# Patient Record
Sex: Male | Born: 1935 | Race: White | Hispanic: No | Smoking: Never smoker
Health system: Southern US, Community
[De-identification: ages and names within clinical notes are randomized; demographics above are authoritative.]

## PROBLEM LIST (undated history)

## (undated) DIAGNOSIS — K922 Gastrointestinal hemorrhage, unspecified: Secondary | ICD-10-CM

## (undated) DIAGNOSIS — H3530 Unspecified macular degeneration: Secondary | ICD-10-CM

## (undated) DIAGNOSIS — N200 Calculus of kidney: Secondary | ICD-10-CM

## (undated) DIAGNOSIS — L821 Other seborrheic keratosis: Secondary | ICD-10-CM

## (undated) DIAGNOSIS — N289 Disorder of kidney and ureter, unspecified: Secondary | ICD-10-CM

## (undated) DIAGNOSIS — G4730 Sleep apnea, unspecified: Secondary | ICD-10-CM

## (undated) DIAGNOSIS — I10 Essential (primary) hypertension: Secondary | ICD-10-CM

## (undated) DIAGNOSIS — D126 Benign neoplasm of colon, unspecified: Secondary | ICD-10-CM

## (undated) DIAGNOSIS — H547 Unspecified visual loss: Secondary | ICD-10-CM

## (undated) DIAGNOSIS — J189 Pneumonia, unspecified organism: Secondary | ICD-10-CM

## (undated) DIAGNOSIS — E669 Obesity, unspecified: Secondary | ICD-10-CM

## (undated) DIAGNOSIS — K31819 Angiodysplasia of stomach and duodenum without bleeding: Secondary | ICD-10-CM

## (undated) DIAGNOSIS — I509 Heart failure, unspecified: Secondary | ICD-10-CM

## (undated) DIAGNOSIS — K219 Gastro-esophageal reflux disease without esophagitis: Secondary | ICD-10-CM

## (undated) DIAGNOSIS — N189 Chronic kidney disease, unspecified: Secondary | ICD-10-CM

## (undated) DIAGNOSIS — I639 Cerebral infarction, unspecified: Secondary | ICD-10-CM

## (undated) DIAGNOSIS — R0902 Hypoxemia: Secondary | ICD-10-CM

## (undated) DIAGNOSIS — E119 Type 2 diabetes mellitus without complications: Secondary | ICD-10-CM

## (undated) DIAGNOSIS — C4490 Unspecified malignant neoplasm of skin, unspecified: Secondary | ICD-10-CM

## (undated) DIAGNOSIS — E785 Hyperlipidemia, unspecified: Secondary | ICD-10-CM

## (undated) DIAGNOSIS — I4510 Unspecified right bundle-branch block: Secondary | ICD-10-CM

## (undated) DIAGNOSIS — K559 Vascular disorder of intestine, unspecified: Secondary | ICD-10-CM

## (undated) HISTORY — DX: Pneumonia, unspecified organism: J18.9

## (undated) HISTORY — DX: Hypoxemia: R09.02

## (undated) HISTORY — PX: DOPPLER ECHOCARDIOGRAPHY: SHX263

## (undated) HISTORY — PX: OTHER SURGICAL HISTORY: SHX169

## (undated) HISTORY — PX: CARDIOVASCULAR STRESS TEST: SHX262

## (undated) HISTORY — DX: Disorder of kidney and ureter, unspecified: N28.9

## (undated) HISTORY — DX: Gastro-esophageal reflux disease without esophagitis: K21.9

## (undated) HISTORY — DX: Other seborrheic keratosis: L82.1

## (undated) HISTORY — DX: Sleep apnea, unspecified: G47.30

## (undated) HISTORY — DX: Angiodysplasia of stomach and duodenum without bleeding: K31.819

## (undated) HISTORY — DX: Obesity, unspecified: E66.9

## (undated) HISTORY — PX: CHOLECYSTECTOMY: SHX55

## (undated) HISTORY — DX: Gastrointestinal hemorrhage, unspecified: K92.2

## (undated) HISTORY — DX: Unspecified visual loss: H54.7

## (undated) HISTORY — DX: Cerebral infarction, unspecified: I63.9

## (undated) HISTORY — PX: ELBOW SURGERY: SHX618

## (undated) HISTORY — DX: Calculus of kidney: N20.0

## (undated) HISTORY — DX: Chronic kidney disease, unspecified: N18.9

## (undated) HISTORY — PX: CARDIAC CATHETERIZATION: SHX172

## (undated) HISTORY — DX: Hyperlipidemia, unspecified: E78.5

## (undated) HISTORY — PX: BACK SURGERY: SHX140

## (undated) HISTORY — DX: Unspecified right bundle-branch block: I45.10

## (undated) HISTORY — PX: TRANSURETHRAL RESECTION OF PROSTATE: SHX73

## (undated) HISTORY — PX: APPENDECTOMY: SHX54

## (undated) HISTORY — DX: Vascular disorder of intestine, unspecified: K55.9

## (undated) HISTORY — PX: CATARACT EXTRACTION: SUR2

## (undated) HISTORY — DX: Essential (primary) hypertension: I10

## (undated) HISTORY — PX: POLYPECTOMY: SHX149

## (undated) HISTORY — DX: Type 2 diabetes mellitus without complications: E11.9

## (undated) HISTORY — DX: Benign neoplasm of colon, unspecified: D12.6

---

## 1997-12-27 ENCOUNTER — Other Ambulatory Visit: Admission: RE | Admit: 1997-12-27 | Discharge: 1997-12-27 | Payer: Self-pay | Admitting: Urology

## 1999-01-13 ENCOUNTER — Ambulatory Visit (HOSPITAL_COMMUNITY): Admission: RE | Admit: 1999-01-13 | Discharge: 1999-01-13 | Payer: Self-pay | Admitting: Cardiovascular Disease

## 1999-01-13 ENCOUNTER — Encounter: Payer: Self-pay | Admitting: Cardiovascular Disease

## 2001-02-04 ENCOUNTER — Ambulatory Visit (HOSPITAL_COMMUNITY): Admission: RE | Admit: 2001-02-04 | Discharge: 2001-02-04 | Payer: Self-pay | Admitting: Urology

## 2001-02-04 ENCOUNTER — Encounter: Payer: Self-pay | Admitting: Urology

## 2001-07-12 ENCOUNTER — Ambulatory Visit (HOSPITAL_COMMUNITY): Admission: RE | Admit: 2001-07-12 | Discharge: 2001-07-12 | Payer: Self-pay | Admitting: Family Medicine

## 2001-07-12 ENCOUNTER — Encounter: Payer: Self-pay | Admitting: Family Medicine

## 2001-10-17 ENCOUNTER — Ambulatory Visit (HOSPITAL_COMMUNITY): Admission: RE | Admit: 2001-10-17 | Discharge: 2001-10-17 | Payer: Self-pay | Admitting: Family Medicine

## 2001-10-17 ENCOUNTER — Encounter: Payer: Self-pay | Admitting: Family Medicine

## 2002-10-30 ENCOUNTER — Encounter: Payer: Self-pay | Admitting: Urology

## 2002-10-30 ENCOUNTER — Ambulatory Visit (HOSPITAL_COMMUNITY): Admission: RE | Admit: 2002-10-30 | Discharge: 2002-10-30 | Payer: Self-pay | Admitting: Urology

## 2004-03-06 ENCOUNTER — Observation Stay (HOSPITAL_COMMUNITY): Admission: RE | Admit: 2004-03-06 | Discharge: 2004-03-07 | Payer: Self-pay | Admitting: Urology

## 2004-09-12 ENCOUNTER — Ambulatory Visit (HOSPITAL_COMMUNITY): Admission: RE | Admit: 2004-09-12 | Discharge: 2004-09-12 | Payer: Self-pay | Admitting: Family Medicine

## 2004-09-20 ENCOUNTER — Ambulatory Visit: Payer: Self-pay | Admitting: Internal Medicine

## 2004-09-20 ENCOUNTER — Inpatient Hospital Stay (HOSPITAL_COMMUNITY): Admission: EM | Admit: 2004-09-20 | Discharge: 2004-09-24 | Payer: Self-pay | Admitting: Emergency Medicine

## 2005-08-03 ENCOUNTER — Other Ambulatory Visit: Admission: RE | Admit: 2005-08-03 | Discharge: 2005-08-03 | Payer: Self-pay | Admitting: Dermatology

## 2006-07-09 ENCOUNTER — Ambulatory Visit (HOSPITAL_COMMUNITY): Admission: RE | Admit: 2006-07-09 | Discharge: 2006-07-09 | Payer: Self-pay | Admitting: Family Medicine

## 2007-06-12 ENCOUNTER — Emergency Department (HOSPITAL_COMMUNITY): Admission: EM | Admit: 2007-06-12 | Discharge: 2007-06-12 | Payer: Self-pay | Admitting: Emergency Medicine

## 2007-08-08 ENCOUNTER — Ambulatory Visit (HOSPITAL_COMMUNITY): Admission: RE | Admit: 2007-08-08 | Discharge: 2007-08-08 | Payer: Self-pay | Admitting: Family Medicine

## 2007-08-08 ENCOUNTER — Inpatient Hospital Stay (HOSPITAL_COMMUNITY): Admission: EM | Admit: 2007-08-08 | Discharge: 2007-08-11 | Payer: Self-pay | Admitting: Emergency Medicine

## 2007-08-08 ENCOUNTER — Encounter (INDEPENDENT_AMBULATORY_CARE_PROVIDER_SITE_OTHER): Payer: Self-pay | Admitting: Family Medicine

## 2007-08-09 ENCOUNTER — Ambulatory Visit: Payer: Self-pay | Admitting: Cardiology

## 2007-09-14 ENCOUNTER — Emergency Department (HOSPITAL_COMMUNITY): Admission: EM | Admit: 2007-09-14 | Discharge: 2007-09-14 | Payer: Self-pay | Admitting: Emergency Medicine

## 2007-12-08 ENCOUNTER — Other Ambulatory Visit: Payer: Self-pay | Admitting: Urology

## 2007-12-08 ENCOUNTER — Observation Stay (HOSPITAL_COMMUNITY): Admission: AD | Admit: 2007-12-08 | Discharge: 2007-12-09 | Payer: Self-pay | Admitting: Urology

## 2007-12-08 ENCOUNTER — Encounter (INDEPENDENT_AMBULATORY_CARE_PROVIDER_SITE_OTHER): Payer: Self-pay | Admitting: Urology

## 2008-12-16 ENCOUNTER — Emergency Department (HOSPITAL_COMMUNITY): Admission: EM | Admit: 2008-12-16 | Discharge: 2008-12-16 | Payer: Self-pay | Admitting: Emergency Medicine

## 2009-01-16 ENCOUNTER — Emergency Department (HOSPITAL_COMMUNITY): Admission: EM | Admit: 2009-01-16 | Discharge: 2009-01-16 | Payer: Self-pay | Admitting: Emergency Medicine

## 2009-01-25 ENCOUNTER — Ambulatory Visit (HOSPITAL_COMMUNITY): Admission: RE | Admit: 2009-01-25 | Discharge: 2009-01-25 | Payer: Self-pay | Admitting: Urology

## 2009-02-12 ENCOUNTER — Ambulatory Visit (HOSPITAL_COMMUNITY): Admission: RE | Admit: 2009-02-12 | Discharge: 2009-02-12 | Payer: Self-pay | Admitting: Family Medicine

## 2009-02-15 ENCOUNTER — Ambulatory Visit: Payer: Self-pay | Admitting: Cardiology

## 2009-02-15 ENCOUNTER — Inpatient Hospital Stay (HOSPITAL_COMMUNITY): Admission: EM | Admit: 2009-02-15 | Discharge: 2009-02-19 | Payer: Self-pay | Admitting: Emergency Medicine

## 2009-02-15 ENCOUNTER — Encounter (INDEPENDENT_AMBULATORY_CARE_PROVIDER_SITE_OTHER): Payer: Self-pay | Admitting: Internal Medicine

## 2009-02-18 ENCOUNTER — Encounter: Admission: RE | Admit: 2009-02-18 | Discharge: 2009-02-18 | Payer: Self-pay | Admitting: Internal Medicine

## 2009-07-22 ENCOUNTER — Emergency Department (HOSPITAL_COMMUNITY): Admission: EM | Admit: 2009-07-22 | Discharge: 2009-07-22 | Payer: Self-pay | Admitting: Emergency Medicine

## 2009-07-26 ENCOUNTER — Ambulatory Visit (HOSPITAL_COMMUNITY): Admission: RE | Admit: 2009-07-26 | Discharge: 2009-07-26 | Payer: Self-pay | Admitting: Family Medicine

## 2010-01-14 ENCOUNTER — Inpatient Hospital Stay (HOSPITAL_COMMUNITY): Admission: EM | Admit: 2010-01-14 | Discharge: 2010-01-20 | Payer: Self-pay | Admitting: Emergency Medicine

## 2010-01-26 ENCOUNTER — Inpatient Hospital Stay (HOSPITAL_COMMUNITY): Admission: EM | Admit: 2010-01-26 | Discharge: 2010-02-05 | Payer: Self-pay | Admitting: Emergency Medicine

## 2010-01-27 ENCOUNTER — Ambulatory Visit: Payer: Self-pay | Admitting: Internal Medicine

## 2010-01-29 ENCOUNTER — Ambulatory Visit: Payer: Self-pay | Admitting: Internal Medicine

## 2010-02-04 ENCOUNTER — Encounter (INDEPENDENT_AMBULATORY_CARE_PROVIDER_SITE_OTHER): Payer: Self-pay

## 2010-02-04 ENCOUNTER — Ambulatory Visit: Payer: Self-pay | Admitting: Gastroenterology

## 2010-02-12 ENCOUNTER — Encounter: Payer: Self-pay | Admitting: Internal Medicine

## 2010-02-27 DIAGNOSIS — E1169 Type 2 diabetes mellitus with other specified complication: Secondary | ICD-10-CM | POA: Insufficient documentation

## 2010-02-27 DIAGNOSIS — I10 Essential (primary) hypertension: Secondary | ICD-10-CM | POA: Insufficient documentation

## 2010-02-27 DIAGNOSIS — Z8719 Personal history of other diseases of the digestive system: Secondary | ICD-10-CM | POA: Insufficient documentation

## 2010-02-27 DIAGNOSIS — K219 Gastro-esophageal reflux disease without esophagitis: Secondary | ICD-10-CM

## 2010-02-27 DIAGNOSIS — E669 Obesity, unspecified: Secondary | ICD-10-CM | POA: Insufficient documentation

## 2010-02-27 DIAGNOSIS — I6350 Cerebral infarction due to unspecified occlusion or stenosis of unspecified cerebral artery: Secondary | ICD-10-CM | POA: Insufficient documentation

## 2010-02-27 DIAGNOSIS — E785 Hyperlipidemia, unspecified: Secondary | ICD-10-CM

## 2010-03-04 ENCOUNTER — Ambulatory Visit: Payer: Self-pay | Admitting: Internal Medicine

## 2010-03-04 ENCOUNTER — Telehealth (INDEPENDENT_AMBULATORY_CARE_PROVIDER_SITE_OTHER): Payer: Self-pay | Admitting: *Deleted

## 2010-03-04 DIAGNOSIS — Z860100 Personal history of colon polyps, unspecified: Secondary | ICD-10-CM | POA: Insufficient documentation

## 2010-03-12 ENCOUNTER — Telehealth (INDEPENDENT_AMBULATORY_CARE_PROVIDER_SITE_OTHER): Payer: Self-pay

## 2010-03-13 ENCOUNTER — Encounter: Payer: Self-pay | Admitting: Internal Medicine

## 2010-03-27 ENCOUNTER — Inpatient Hospital Stay (HOSPITAL_COMMUNITY): Admission: EM | Admit: 2010-03-27 | Discharge: 2010-04-03 | Payer: Self-pay | Admitting: Emergency Medicine

## 2010-03-28 ENCOUNTER — Ambulatory Visit: Payer: Self-pay | Admitting: Internal Medicine

## 2010-03-29 ENCOUNTER — Ambulatory Visit: Payer: Self-pay | Admitting: Internal Medicine

## 2010-03-29 ENCOUNTER — Encounter (INDEPENDENT_AMBULATORY_CARE_PROVIDER_SITE_OTHER): Payer: Self-pay

## 2010-03-30 ENCOUNTER — Ambulatory Visit: Payer: Self-pay | Admitting: Internal Medicine

## 2010-03-31 ENCOUNTER — Ambulatory Visit: Payer: Self-pay | Admitting: Internal Medicine

## 2010-04-04 ENCOUNTER — Encounter: Payer: Self-pay | Admitting: Internal Medicine

## 2010-04-22 ENCOUNTER — Ambulatory Visit: Payer: Self-pay | Admitting: Internal Medicine

## 2010-04-22 ENCOUNTER — Encounter: Payer: Self-pay | Admitting: Gastroenterology

## 2010-04-25 ENCOUNTER — Encounter: Payer: Self-pay | Admitting: Gastroenterology

## 2010-04-25 ENCOUNTER — Encounter (INDEPENDENT_AMBULATORY_CARE_PROVIDER_SITE_OTHER): Payer: Self-pay

## 2010-04-25 DIAGNOSIS — D649 Anemia, unspecified: Secondary | ICD-10-CM

## 2010-04-25 LAB — CONVERTED CEMR LAB
Basophils Absolute: 0 10*3/uL (ref 0.0–0.1)
Eosinophils Absolute: 0.3 10*3/uL (ref 0.0–0.7)
Eosinophils Relative: 3 % (ref 0–5)
HCT: 37.6 % — ABNORMAL LOW (ref 39.0–52.0)
Lymphs Abs: 2.4 10*3/uL (ref 0.7–4.0)
MCV: 89.5 fL (ref 78.0–100.0)
Monocytes Absolute: 0.9 10*3/uL (ref 0.1–1.0)
Platelets: 378 10*3/uL (ref 150–400)
RDW: 15.1 % (ref 11.5–15.5)

## 2010-05-05 ENCOUNTER — Encounter (INDEPENDENT_AMBULATORY_CARE_PROVIDER_SITE_OTHER): Payer: Self-pay | Admitting: *Deleted

## 2010-05-09 ENCOUNTER — Encounter: Payer: Self-pay | Admitting: Internal Medicine

## 2010-06-11 ENCOUNTER — Encounter: Payer: Self-pay | Admitting: Internal Medicine

## 2010-06-29 HISTORY — PX: NM MYOVIEW LTD: HXRAD82

## 2010-07-21 ENCOUNTER — Encounter: Payer: Self-pay | Admitting: Urology

## 2010-07-31 NOTE — Assessment & Plan Note (Signed)
Summary: follow up from hospital- cdg   Visit Type:  Follow-up Visit Primary Care Provider:  fusco  Chief Complaint:  follow up from hosp. doing better.  History of Present Illness: 77 year old gentleman recent hospitalization for colitis. Stool studies negative. He ultimately responded to course of vancomycin. Sigmoidoscopy demonstrated nonspecific colitis. He is back to baseline. He has history of colonic adenoma removed in 2006. He is due for surveillance colonoscopy at this time. He is having one to 3 bowel movements daily. He has not passed any blood per rectum.  Preventive Screening-Counseling & Management  Alcohol-Tobacco     Smoking Status: never  Current Problems (verified): 1)  Hyperlipidemia  (ICD-272.4) 2)  Exogenous Obesity  (ICD-278.00) 3)  Gastrointestinal Hemorrhage, Hx of  (ICD-V12.79) 4)  Gerd  (ICD-530.81) 5)  Cva  (ICD-434.91) 6)  Hypertension  (ICD-401.9) 7)  Dm  (ICD-250.00)  Current Medications (verified): 1)  Norvasc 10 Mg Tabs (Amlodipine Besylate) .... Once Daily 2)  Aspir-Low 81 Mg Tbec (Aspirin) 3)  Clonidine Hcl 0.2 Mg Tabs (Clonidine Hcl) .... Three Times A Day 4)  Lantus 100 Unit/ml Soln (Insulin Glargine) .... 25 Units At Bedtime 5)  Januvia 100 Mg Tabs (Sitagliptin Phosphate) .... Once Daily 6)  Metoprolol Tartrate 50 Mg Tabs (Metoprolol Tartrate) .... Two Times A Day 7)  Tamsulosin Hcl 0.4 Mg Caps (Tamsulosin Hcl) .... Once Daily 8)  Trilipix 135 Mg Cpdr (Choline Fenofibrate) .... Once Daily 9)  Florastor 250 Mg Caps (Saccharomyces Boulardii) .... Once Daily 10)  Tylenol Extra Strength 500 Mg Tabs (Acetaminophen) .... 2 At Bedtime  Allergies (verified): 1)  ! Darvon 2)  ! Sulfa  Past History:  Past Medical History: Diverticulitis Hypertension DM  Past Surgical History: back elbow gallbladder  Family History: Father: deceased Mother: deceased Siblings: 5 brothers, 2 sisters No FH of Colon Cancer:  Social History: Marital  Status: widow Children: 3 Occupation:retired  Patient has never smoked.  Alcohol Use - no Smoking Status:  never  Vital Signs:  Patient profile:   77 year old male Height:      67 inches Weight:      237 pounds BMI:     37.25 Temp:     97.6 degrees F oral Pulse rate:   76 / minute BP sitting:   132 / 88  (left arm) Cuff size:   regular  Vitals Entered By: Hendricks Limes LPN (March 04, 2010 10:21 AM)  Physical Exam  General:  alert conversant no acute distress Lungs:  clear to auscultation Heart:  regular rate rhythm without murmur gallop rub Abdomen:  nondistended positive bowel sounds soft nontender without appreciable mass or organomegaly Rectal:  deferred until time of colonoscopy  Impression & Recommendations: Impression: 77 year old gentleman recently hospitalized with  diarrhea ; patchy colitis on flexible sigmoidoscopy. Biopsied nonspecific -  stool studies are negative including C. difficile assay; improved with  a course of  oral vancomycin - symptoms eventually improve;  History of a colonic adenoma -  due for surveillance colonoscopy this time.  Recommendations: Surveillance colonoscopy in the near future. Risks, benefits, limitations, alternatives and imponderables have been reviewed her questions have been answered; he is agreeable.  We'll decrease his Lantus insulin to 20 units  night before the procedure; further recommendations to follow.  Appended Document: Orders Update    Clinical Lists Changes  Problems: Added new problem of COLONIC POLYPS, HX OF (ICD-V12.72) Orders: Added new Service order of Est. Patient Level III (81191) - Signed

## 2010-07-31 NOTE — Letter (Signed)
Summary: OFFICE NOTE FROM [facility]  OFFICE NOTE FROM [facility]   Imported By: Rexene Alberts 06/11/2010 14:24:55  _____________________________________________________________________  External Attachment:    Type:   Image     Comment:   External Document

## 2010-07-31 NOTE — Letter (Signed)
Summary: CONSULTATION FROM 01/27/10  CONSULTATION FROM 01/27/10   Imported By: Rexene Alberts 02/12/2010 10:38:14  _____________________________________________________________________  External Attachment:    Type:   Image     Comment:   External Document

## 2010-07-31 NOTE — Letter (Signed)
Summary: LABS  LABS   Imported By: Rexene Alberts 05/09/2010 11:03:42  _____________________________________________________________________  External Attachment:    Type:   Image     Comment:   External Document

## 2010-07-31 NOTE — Progress Notes (Signed)
Summary: ? about prep  Phone Note Call from Patient Call back at Home Phone [phone]   Caller: Patient Summary of Call: pt came by office- he had refused to schedule tcs during his ov because he stated he didnt want to drink the prep. After going home and thinking about it and speaking with friends he has decided he would do the procedure but only if he was given the osmo prep. I infomed pt of the potential for kidney damage and he said that it was the only way he would do procedure. please advise Initial call taken by: Hendricks Limes LPN,  March 12, 2010 3:41 PM     Appended Document: ? about prep ok ; we will go w osmoprep ; pt needs to be strongly advised to increase fluid intake by 50% DURING PREP PERIOD ; WILL GIVE SOME ADDITONAL IVF AT TIME OF PROCEDURE  Appended Document: ? about prep pt aware, went over the need for increased fluids during the prep several times and warned him of the risks of kidney damage again. pt verbalized understanding and stated he was available anytime to do his procedure.   Appended Document: ? about prep Osmo Prep instructions left at the front desk for the pt.

## 2010-07-31 NOTE — Letter (Signed)
Summary: [facility] PREP INSTRUCTIONS  [facility] PREP INSTRUCTIONS   Imported By: Ave Filter 03/13/2010 10:55:56  _____________________________________________________________________  External Attachment:    Type:   Image     Comment:   External Document

## 2010-07-31 NOTE — Letter (Signed)
Summary: Recall, Labs Needed  [facility]  [address]   [place], [place] [address]   Phone: [phone]  Fax: [phone]    April 25, 2010  BASSAM DRESCH [address] [place], [place]  [address] 03-Aug-1930   Dear Mr. KERSH,   Our records indicate it is time to repeat your blood work.  You can take the enclosed form to the lab on or near the date indicated.  Please make note of the new location of the lab:   [address], 2nd floor   [facility]  Our office will call you within a week to ten business days with the results.  If you do not hear from [place] in 10 business days, you should call the office.  If you have any questions regarding this, call the office at [phone], and ask for the nurse.  Labs are due on 05/26/2010.   Sincerely,    Hendricks Limes LPN  [facility] Ph: [phone]   Fax: [phone]

## 2010-07-31 NOTE — Miscellaneous (Signed)
Summary: Orders Update  Clinical Lists Changes  Problems: Added new problem of ANEMIA (ICD-285.9) Orders: Added new Test order of T-Hemoglobin and Hematocrit (1005) - Signed 

## 2010-07-31 NOTE — Letter (Signed)
Summary: Patient Notice, Colon Biopsy Results  [facility]  [address]   [place], [place] [address]   Phone: [phone]  Fax: [phone]       April 04, 2010   John [name] [address] [place], [place]  [address] 1931-05-31    Dear John [name],  I am pleased to inform you that the biopsies taken during your recent colonoscopy did not show any evidence of cancer upon pathologic examination.  Additional information/recommendations:  You should have a repeat colonoscopy examination  in 5 years.  Please call us if you are having persistent problems or have questions about your condition that have not been fully answered at this time.  Sincerely,    R. Roetta Sessions MD, FACP [facility] Ph: [phone]    Fax: [phone]   Appended Document: Patient Notice, Colon Biopsy Results letter mailed to pt  Appended Document: Patient Notice, Colon Biopsy Results reminder in computer

## 2010-07-31 NOTE — Progress Notes (Signed)
Summary: Diabetic Med Adjustment  ---- Converted from flag ---- ---- 03/04/2010 10:54 AM, [name] MD, Caleen Essex wrote: about Johnothan Brion - decrease lantus to 20 units night before tcs-everything else ok ------------------------------  Appended Document: Diabetic Med Adjustment   Pt informed.

## 2010-07-31 NOTE — Miscellaneous (Signed)
Summary: CONSULTATION  Clinical Lists Changes  NAME:  John [name], John [name]                ACCOUNT NO.:  [id number]      MEDICAL RECORD NO.:  [id number]          PATIENT TYPE:  INP      LOCATION:  IC03                          FACILITY:  [facility]      PHYSICIAN:  R. Roetta Sessions, M.D. DATE OF BIRTH:  1930/09/20      DATE OF CONSULTATION:  03/28/2010   DATE OF DISCHARGE:                                    CONSULTATION         REFERRING PHYSICIAN:  Dr. Osvaldo Shipper with [facility] team.      REASON FOR CONSULTATION:  GI bleed.      HISTORY OF PRESENT ILLNESS:  John [name] is a very pleasant 77 year old   gentleman, who we saw during hospitalization back in August, when he   presented with abdominal pain and bloody stools.  He underwent a   flexible sigmoidoscopy at that time by Dr. Darrick Penna that showed patchy   colitis beginning in the sigmoid colon and extending to the transverse   colon, most pronounced in the descending and sigmoid colon.  There was   erythema, edema and ulceration with mucosal sparing.  Biopsies favoredan ischemic process, such as ischemic colitis.  His stool studies were   negative.  He was treated with Flagyl and eventually vancomycin and   returned to his baseline.  He was seen in the office by Dr. Jena Gauss back   on March 04, 2010, and was actually doing very well.  They were   scheduling a surveillance colonoscopy given his history of previous   adenomas.  He was actually supposed to have this done yesterday or   today.  The patient states that all week he just has not felt well.   Really nothing specific.  Yesterday, however, he started getting sweaty   and felt lightheaded.  He passed a couple of black stools.  Later in the   evening, he passed more fresh blood per rectum and filled up the   commode.  He denies any Pepto-Bismol use.  He has had a history of   bleeding peptic ulcer disease in the past as outlined below.  Since he   presented to the hospital,  he had another large fresh blood per rectum   and became quite hypotensive and a code was actually called on him.  He   has received 2 units of packed red blood cells at this point.  This   morning at 4 a.m., his hemoglobin was 8.3.  When he presented, his   hemoglobin was 10.3.  PT and INR were normal.  He ultimately did have a   CT during the night due to the significant bleeding.  He had a liquid   stool throughout the colon, but nonspecific.  He had renal cortical   atrophy with multiple bilateral small renal lesions, felt to be cysts.   The study was noncontrast.  The liver appeared to be normal.  No report   or evidence of cirrhosis.  The patient takes aspirin 81 mg daily, but no other NSAIDs or aspirin   use.      MEDICATIONS AT HOME:   1. Lantus 25 units nightly.   2. Florastor 250 mg daily.   3. Norvasc 10 mg daily.   4. Aspirin 81 mg daily.   5. Januvia 100 mg daily.   6. Metoprolol 50 mg b.i.d.   7. Flomax 0.4 mg daily.   8. Trilipix 135 mg daily.   9. Tylenol Extra-Strength 2 at bedtime.      ALLERGIES:   1. DARVON.   2. SULFA.   3. BETADINE.      PAST MEDICAL HISTORY:   1. In 2006, he presented with a GI bleed.  He had a colonoscopy and       EGD during that hospitalization.  Colonoscopy and terminal       ileoscopy was okay, except for a small polyp at the descending       colon.  He had erosive antral gastritis with two polyps, one at the       antrum and one just below the cardia without stigmata of bleeding.       Some oozing from a fundal polyp, which was easily controlled.  The       study was followed by a Givens capsule of the small bowel, which       showed a few jejunal ulcers with stigmata of active bleeding and       oozing.  At that time, he was on naproxen and low-dose aspirin.  H.       pylori serologies were negative.   2. Diabetes.   3. Pneumonia in July, requiring antibiotics.   4. Hyperlipidemia.   5. Hypertension.   6. Stroke.   7.  Chronic GERD.   8. Left eye poor vision.   9. Bilateral cataract extraction.   10.TURP with history of BPH.   11.Lumbar spine surgery in the remote past.   12.Nephrolithiasis.   13.Sleep apnea.   14.Obesity.   15.Peripheral edema.   16.Appendectomy.   17.Cholecystectomy.   18.Chronic renal insufficiency.   19.Elbow surgery.      FAMILY HISTORY:  Negative for colorectal cancer, chronic liver disease   or GI problems.  No IBD.  He had a brother, who had GI bleed recently.   A sister deceased of unknown malignancy.  One daughter deceased   secondary to brain aneurysm at age 82.      SOCIAL HISTORY:  He is widowed.  He has two living children.  He is   retired from [employer], where he worked for over 30 years.  He denies   any tobacco, alcohol or drug use.      REVIEW OF SYSTEMS:  See HPI for GI.  CONSTITUTIONAL:  Denies any weight   loss.  He complains of night sweats chronically.  CARDIOPULMONARY:   Denies chest pain, shortness of breath, palpitations or cough.   GENITOURINARY:  Denies dysuria or hematuria.      PHYSICAL EXAMINATION:  VITAL SIGNS:  Weight is 106 kg, height 67 inches.   Temperature 98.1, pulse 102, respirations 16, blood pressure 132/55.   GENERAL:  A pleasant obese, ill-appearing Caucasian gentleman in no   acute distress.   SKIN:  Warm and dry.  No jaundice.   HEENT:  Sclerae nonicteric.  Oropharyngeal mucosa moist and pink.  No   lesions, erythema or exudate.  No lymphadenopathy or thyromegaly.   CHEST:  Lungs are clear to auscultation.   CARDIAC:  Reveals a regular rate and rhythm.  No murmurs.   ABDOMEN:  Positive bowel sounds, obese.  He has mild to moderate   tenderness in the upper abdomen to deep palpation.  No rebound or   guarding.  No organomegaly or masses.  No abdominal bruits or hernias.   LOWER EXTREMITIES:  No edema.      LABORATORY DATA:  As mentioned above.  In addition, his sodium is 141,   potassium 3.9, BUN 33, creatinine 1.65, white  count 14,300, platelets   393,000.  Total bilirubin 0.6, alk phos 46, AST 18, ALT 15, albumin 3.   Creatinine on admission was 1.7, BUN was 32.  His PTT is 30, INR is   0.99.  Notably on February 04, 2010, his hemoglobin was 12.8, creatinine   was 1.5.      IMPRESSION:  John [name] is a very pleasant 77 year old gentleman with   hospitalization last month for colitis, possibly ischemic.  He   completely recovered from that illness, but yesterday he developed what   appears be an upper gastrointestinal bleed.  He has a history of   previous jejunal ulcers with bleeding while he was on non-steroidal anti-   inflammatory drugs at the time.  There is no evidence of cirrhosis on   CT, therefore an esophageal variceal bleed very unlikely.  His last   esophagogastroduodenoscopy was in 2006.  We recommend   esophagogastroduodenoscopy at this time to further evaluate upper   gastrointestinal bleed.  We will plan for procedure at bedside.  He was   scheduled for a surveillance colonoscopy within the last 24 hours, of   course this was not done.  This is a loose end that will need to be done   at a later date.      PLAN:   1. EGD today at bedside.   2. Continue IV Protonix b.i.d.   3. Serial hemoglobin and hematocrit, transfuse as needed.   4. Further recommendations to follow.               Tana Coast, P.A.         ______________________________   R. Roetta Sessions, M.D.            LL/MEDQ  D:  03/28/2010  T:  03/28/2010  Job:  [id number]      cc:   Osvaldo Shipper, MD      Corrie Mckusick, M.D.   Fax: [phone]      Electronically Signed by Tana Coast P.A. on 04/09/2010 08:05:41 AM   Electronically Signed by Lorrin Goodell M.D. on 05/05/2010 11:17:08 AM

## 2010-07-31 NOTE — Letter (Signed)
Summary: CONSULTATION  CONSULTATION   Imported By: Rexene Alberts 02/12/2010 14:23:28  _____________________________________________________________________  External Attachment:    Type:   Image     Comment:   External Document

## 2010-07-31 NOTE — Assessment & Plan Note (Signed)
Summary: HOS FU IN 2 WEEKS/SS   Visit Type:  f/u Primary Care Provider:  Robbie Lis   Chief Complaint:  Hospital f/U and GI bleed.  History of Present Illness: John [name] is here for f/u of recent hospitalization for obscure GI bleeding. He required at least 4-5 units of prbcs. He had been on ASA and started on naproxyn and medro dospak for arthritic pain. He denies any ASA or NSAIDS since d/c earlier this month. His Hgb at D/C was 8.9. He is due for f/u labs. He is taking iron and pantoprazole daily. Denies abd pain, n/v, heartburn, constipation, diarrhea, melena, brbpr. He is eating well. He has appt with his cardiologist and endocrinologist in couple of weeks.     Current Medications (verified): 1)  Norvasc 10 Mg Tabs (Amlodipine Besylate) .... Once Daily 2)  Clonidine Hcl 0.2 Mg Tabs (Clonidine Hcl) .... Three Times A Day 3)  Lantus 100 Unit/ml Soln (Insulin Glargine) .... 35 Units At Bedtime 4)  Januvia 100 Mg Tabs (Sitagliptin Phosphate) .... Once Daily 5)  Tamsulosin Hcl 0.4 Mg Caps (Tamsulosin Hcl) .... Once Daily 6)  Trilipix 135 Mg Cpdr (Choline Fenofibrate) .... Once Daily 7)  Tylenol Extra Strength 500 Mg Tabs (Acetaminophen) .... 2 At Bedtime As Needed 8)  Multi-Vitamin .... Take 1 Tablet By Mouth Once A Day 9)  Iron 150 Mg .... Take 1 Tablet By Mouth Once A Day 10)  Pantoprazole Sodium 40 Mg Tbec (Pantoprazole Sodium) .... Take 1 Tablet By Mouth Once A Day 11)  Clonidine .... Take 1 Tablet By Mouth Three Times A Day  Allergies: 1)  ! Darvon 2)  ! Sulfa 3)  ! Betadine 4)  ! * Latex  Review of Systems      See HPI  Vital Signs:  Patient profile:   77 year old male Height:      67 inches Weight:      236 pounds BMI:     37.10 Temp:     97.8 degrees F oral Pulse rate:   72 / minute BP sitting:   144 / 80  (left arm) Cuff size:   regular  Vitals Entered By: Cloria Spring LPN (April 22, 2010 9:16 AM)  Physical Exam  General:  Well developed, well nourished, no  acute distress.obese.   Head:  Normocephalic and atraumatic. Mouth:  op moist Abdomen:  Bowel sounds normal.  Abdomen is soft, nontender, nondistended.  No rebound or guarding.  No hepatosplenomegaly, masses or hernias.  No abdominal bruits.  Extremities:  No clubbing, cyanosis, edema or deformities noted. Neurologic:  Alert and  oriented x4;  grossly normal neurologically. Skin:  Intact without significant lesions or rashes. Psych:  Alert and cooperative. Normal mood and affect.  Impression & Recommendations:  Problem # 1:  GASTROINTESTINAL HEMORRHAGE, HX OF (ICD-V12.79) Obscure GI bleeding. Suspected to be due to NSAID/ASA induced SB lesion. He has h/o jejunal ulcers in 2006 seen at time of SB Givens, was on Naproxyn then. Recent SB Givens was incomplete but there were few nonbleeding erosions. He had blood tinged mucosa throughtout colon without active bleeding site noted. TI looked normal as well. Tubular adenomas removed from colon. No EGD findings to explain GI bleed. He is advised to avoid NSAIDS indefinetly. He will continue PPI. We will recheck his CBC today. If H/H is much better, then consider restarting ASA (h/o remote mild CVA). Further recommendations to follow. Orders: T-CBC w/Diff ([id number]) Est. Patient Level II (09811)

## 2010-07-31 NOTE — Letter (Signed)
Summary: TCS ORDER  TCS ORDER   Imported By: Ave Filter 03/04/2010 11:46:58  _____________________________________________________________________  External Attachment:    Type:   Image     Comment:   External Document

## 2010-09-10 LAB — GLUCOSE, CAPILLARY
Glucose-Capillary: 126 mg/dL — ABNORMAL HIGH (ref 70–99)
Glucose-Capillary: 126 mg/dL — ABNORMAL HIGH (ref 70–99)
Glucose-Capillary: 134 mg/dL — ABNORMAL HIGH (ref 70–99)
Glucose-Capillary: 136 mg/dL — ABNORMAL HIGH (ref 70–99)
Glucose-Capillary: 138 mg/dL — ABNORMAL HIGH (ref 70–99)
Glucose-Capillary: 157 mg/dL — ABNORMAL HIGH (ref 70–99)
Glucose-Capillary: 160 mg/dL — ABNORMAL HIGH (ref 70–99)
Glucose-Capillary: 170 mg/dL — ABNORMAL HIGH (ref 70–99)
Glucose-Capillary: 171 mg/dL — ABNORMAL HIGH (ref 70–99)
Glucose-Capillary: 175 mg/dL — ABNORMAL HIGH (ref 70–99)
Glucose-Capillary: 183 mg/dL — ABNORMAL HIGH (ref 70–99)

## 2010-09-10 LAB — BASIC METABOLIC PANEL
BUN: 15 mg/dL (ref 6–23)
BUN: 34 mg/dL — ABNORMAL HIGH (ref 6–23)
BUN: 9 mg/dL (ref 6–23)
BUN: 9 mg/dL (ref 6–23)
CO2: 26 mEq/L (ref 19–32)
CO2: 27 mEq/L (ref 19–32)
CO2: 27 mEq/L (ref 19–32)
CO2: 28 mEq/L (ref 19–32)
CO2: 28 mEq/L (ref 19–32)
Calcium: 8.6 mg/dL (ref 8.4–10.5)
Chloride: 106 mEq/L (ref 96–112)
Chloride: 107 mEq/L (ref 96–112)
Chloride: 109 mEq/L (ref 96–112)
Chloride: 109 mEq/L (ref 96–112)
Chloride: 109 mEq/L (ref 96–112)
Creatinine, Ser: 1.31 mg/dL (ref 0.4–1.5)
Creatinine, Ser: 1.95 mg/dL — ABNORMAL HIGH (ref 0.4–1.5)
GFR calc Af Amer: >60 mL/min (ref >60)
GFR calc non Af Amer: 53 mL/min — ABNORMAL LOW (ref >60)
Glucose, Bld: 138 mg/dL — ABNORMAL HIGH (ref 70–99)
Glucose, Bld: 143 mg/dL — ABNORMAL HIGH (ref 70–99)
Glucose, Bld: 146 mg/dL — ABNORMAL HIGH (ref 70–99)
Glucose, Bld: 159 mg/dL — ABNORMAL HIGH (ref 70–99)
Glucose, Bld: 160 mg/dL — ABNORMAL HIGH (ref 70–99)
Potassium: 3.4 mEq/L — ABNORMAL LOW (ref 3.5–5.1)
Potassium: 3.5 mEq/L (ref 3.5–5.1)
Potassium: 3.5 mEq/L (ref 3.5–5.1)
Potassium: 3.6 mEq/L (ref 3.5–5.1)
Sodium: 140 mEq/L (ref 135–145)
Sodium: 141 mEq/L (ref 135–145)
Sodium: 143 mEq/L (ref 135–145)

## 2010-09-10 LAB — DIFFERENTIAL
Basophils Absolute: 0.1 10*3/uL (ref 0.0–0.1)
Basophils Absolute: 0.2 10*3/uL — ABNORMAL HIGH (ref 0.0–0.1)
Basophils Relative: 1 % (ref 0–1)
Basophils Relative: 1 % (ref 0–1)
Basophils Relative: 2 % — ABNORMAL HIGH (ref 0–1)
Basophils Relative: 2 % — ABNORMAL HIGH (ref 0–1)
Eosinophils Absolute: 0.3 10*3/uL (ref 0.0–0.7)
Eosinophils Absolute: 0.3 10*3/uL (ref 0.0–0.7)
Eosinophils Absolute: 0.4 10*3/uL (ref 0.0–0.7)
Eosinophils Absolute: 0.4 10*3/uL (ref 0.0–0.7)
Eosinophils Relative: 2 % (ref 0–5)
Eosinophils Relative: 3 % (ref 0–5)
Lymphs Abs: 2.4 10*3/uL (ref 0.7–4.0)
Lymphs Abs: 2.5 10*3/uL (ref 0.7–4.0)
Lymphs Abs: 2.7 10*3/uL (ref 0.7–4.0)
Monocytes Absolute: 0.7 10*3/uL (ref 0.1–1.0)
Monocytes Absolute: 0.8 10*3/uL (ref 0.1–1.0)
Monocytes Absolute: 0.8 10*3/uL (ref 0.1–1.0)
Monocytes Absolute: 0.8 10*3/uL (ref 0.1–1.0)
Monocytes Relative: 7 % (ref 3–12)
Monocytes Relative: 8 % (ref 3–12)
Monocytes Relative: 8 % (ref 3–12)
Monocytes Relative: 9 % (ref 3–12)
Neutro Abs: 6.3 10*3/uL (ref 1.7–7.7)
Neutrophils Relative %: 63 % (ref 43–77)

## 2010-09-10 LAB — CBC
HCT: 21.2 % — ABNORMAL LOW (ref 39.0–52.0)
HCT: 24.8 % — ABNORMAL LOW (ref 39.0–52.0)
HCT: 25.7 % — ABNORMAL LOW (ref 39.0–52.0)
HCT: 26.8 % — ABNORMAL LOW (ref 39.0–52.0)
Hemoglobin: 7.4 g/dL — ABNORMAL LOW (ref 13.0–17.0)
Hemoglobin: 8.6 g/dL — ABNORMAL LOW (ref 13.0–17.0)
Hemoglobin: 8.9 g/dL — ABNORMAL LOW (ref 13.0–17.0)
Hemoglobin: 9.1 g/dL — ABNORMAL LOW (ref 13.0–17.0)
MCH: 30.1 pg (ref 26.0–34.0)
MCH: 30.1 pg (ref 26.0–34.0)
MCH: 30.4 pg (ref 26.0–34.0)
MCH: 30.5 pg (ref 26.0–34.0)
MCH: 30.5 pg (ref 26.0–34.0)
MCHC: 34 g/dL (ref 30.0–36.0)
MCHC: 34.5 g/dL (ref 30.0–36.0)
MCHC: 34.8 g/dL (ref 30.0–36.0)
MCHC: 34.8 g/dL (ref 30.0–36.0)
MCHC: 35.2 g/dL (ref 30.0–36.0)
MCV: 86.4 fL (ref 78.0–100.0)
MCV: 86.7 fL (ref 78.0–100.0)
MCV: 87.2 fL (ref 78.0–100.0)
MCV: 87.6 fL (ref 78.0–100.0)
MCV: 87.7 fL (ref 78.0–100.0)
Platelets: 202 10*3/uL (ref 150–400)
Platelets: 287 10*3/uL (ref 150–400)
RBC: 2.79 MIL/uL — ABNORMAL LOW (ref 4.22–5.81)
RBC: 2.95 MIL/uL — ABNORMAL LOW (ref 4.22–5.81)
RDW: 14.6 % (ref 11.5–15.5)
RDW: 14.7 % (ref 11.5–15.5)
RDW: 15.2 % (ref 11.5–15.5)
RDW: 15.2 % (ref 11.5–15.5)
WBC: 13.6 10*3/uL — ABNORMAL HIGH (ref 4.0–10.5)

## 2010-09-10 LAB — HEMOGLOBIN AND HEMATOCRIT, BLOOD
HCT: 22.7 % — ABNORMAL LOW (ref 39.0–52.0)
HCT: 25.9 % — ABNORMAL LOW (ref 39.0–52.0)
HCT: 27.6 % — ABNORMAL LOW (ref 39.0–52.0)
Hemoglobin: 7.9 g/dL — ABNORMAL LOW (ref 13.0–17.0)
Hemoglobin: 9.4 g/dL — ABNORMAL LOW (ref 13.0–17.0)

## 2010-09-10 LAB — PREPARE RBC (CROSSMATCH)

## 2010-09-10 LAB — MAGNESIUM: Magnesium: 1.9 mg/dL (ref 1.5–2.5)

## 2010-09-11 LAB — POCT I-STAT, CHEM 8
Calcium, Ion: 1.13 mmol/L (ref 1.12–1.32)
Chloride: 109 mEq/L (ref 96–112)
Glucose, Bld: 189 mg/dL — ABNORMAL HIGH (ref 70–99)
HCT: 28 % — ABNORMAL LOW (ref 39.0–52.0)
Hemoglobin: 9.5 g/dL — ABNORMAL LOW (ref 13.0–17.0)
Potassium: 3.6 mEq/L (ref 3.5–5.1)

## 2010-09-11 LAB — CBC
HCT: 24.4 % — ABNORMAL LOW (ref 39.0–52.0)
HCT: 25.4 % — ABNORMAL LOW (ref 39.0–52.0)
HCT: 30.3 % — ABNORMAL LOW (ref 39.0–52.0)
HCT: 31.1 % — ABNORMAL LOW (ref 39.0–52.0)
Hemoglobin: 10.2 g/dL — ABNORMAL LOW (ref 13.0–17.0)
Hemoglobin: 8.3 g/dL — ABNORMAL LOW (ref 13.0–17.0)
MCV: 84.5 fL (ref 78.0–100.0)
MCV: 86.6 fL (ref 78.0–100.0)
MCV: 87.8 fL (ref 78.0–100.0)
Platelets: 420 10*3/uL — ABNORMAL HIGH (ref 150–400)
RBC: 2.82 MIL/uL — ABNORMAL LOW (ref 4.22–5.81)
RBC: 2.92 MIL/uL — ABNORMAL LOW (ref 4.22–5.81)
RBC: 3.68 MIL/uL — ABNORMAL LOW (ref 4.22–5.81)
RDW: 15.1 % (ref 11.5–15.5)
RDW: 15.5 % (ref 11.5–15.5)
RDW: 15.6 % — ABNORMAL HIGH (ref 11.5–15.5)
WBC: 13.1 10*3/uL — ABNORMAL HIGH (ref 4.0–10.5)
WBC: 14.3 10*3/uL — ABNORMAL HIGH (ref 4.0–10.5)
WBC: 15.6 10*3/uL — ABNORMAL HIGH (ref 4.0–10.5)
WBC: 20.9 10*3/uL — ABNORMAL HIGH (ref 4.0–10.5)

## 2010-09-11 LAB — DIFFERENTIAL
Basophils Absolute: 0 10*3/uL (ref 0.0–0.1)
Basophils Absolute: 0.1 10*3/uL (ref 0.0–0.1)
Eosinophils Absolute: 0 10*3/uL (ref 0.0–0.7)
Eosinophils Relative: 0 % (ref 0–5)
Eosinophils Relative: 0 % (ref 0–5)
Eosinophils Relative: 0 % (ref 0–5)
Lymphocytes Relative: 11 % — ABNORMAL LOW (ref 12–46)
Lymphocytes Relative: 12 % (ref 12–46)
Lymphocytes Relative: 16 % (ref 12–46)
Lymphs Abs: 1.7 10*3/uL (ref 0.7–4.0)
Lymphs Abs: 2.2 10*3/uL (ref 0.7–4.0)
Lymphs Abs: 2.4 10*3/uL (ref 0.7–4.0)
Monocytes Absolute: 0.5 10*3/uL (ref 0.1–1.0)
Monocytes Absolute: 0.8 10*3/uL (ref 0.1–1.0)
Monocytes Absolute: 0.9 10*3/uL (ref 0.1–1.0)
Monocytes Absolute: 1.5 10*3/uL — ABNORMAL HIGH (ref 0.1–1.0)
Monocytes Relative: 6 % (ref 3–12)
Monocytes Relative: 7 % (ref 3–12)
Monocytes Relative: 7 % (ref 3–12)
Neutro Abs: 11.8 10*3/uL — ABNORMAL HIGH (ref 1.7–7.7)
Neutro Abs: 17.1 10*3/uL — ABNORMAL HIGH (ref 1.7–7.7)

## 2010-09-11 LAB — GLUCOSE, CAPILLARY: Glucose-Capillary: 156 mg/dL — ABNORMAL HIGH (ref 70–99)

## 2010-09-11 LAB — URINALYSIS, ROUTINE W REFLEX MICROSCOPIC
Ketones, ur: NEGATIVE mg/dL
Leukocytes, UA: NEGATIVE
Nitrite: NEGATIVE
Urobilinogen, UA: 0.2 mg/dL (ref 0.0–1.0)
pH: 5 (ref 5.0–8.0)

## 2010-09-11 LAB — HEMOGLOBIN AND HEMATOCRIT, BLOOD: Hemoglobin: 8.5 g/dL — ABNORMAL LOW (ref 13.0–17.0)

## 2010-09-11 LAB — COMPREHENSIVE METABOLIC PANEL
ALT: 15 U/L (ref 0–53)
Albumin: 3 g/dL — ABNORMAL LOW (ref 3.5–5.2)
Alkaline Phosphatase: 46 U/L (ref 39–117)
Chloride: 103 mEq/L (ref 96–112)
Glucose, Bld: 187 mg/dL — ABNORMAL HIGH (ref 70–99)
Potassium: 3.7 mEq/L (ref 3.5–5.1)
Sodium: 134 mEq/L — ABNORMAL LOW (ref 135–145)
Total Bilirubin: 0.6 mg/dL (ref 0.3–1.2)
Total Protein: 6 g/dL (ref 6.0–8.3)

## 2010-09-11 LAB — TYPE AND SCREEN
ABO/RH(D): A POS
Antibody Screen: NEGATIVE

## 2010-09-11 LAB — HEMOGLOBIN A1C
Hgb A1c MFr Bld: 9.2 % — ABNORMAL HIGH (ref <5.7)
Mean Plasma Glucose: 217 mg/dL — ABNORMAL HIGH (ref <117)

## 2010-09-11 LAB — BASIC METABOLIC PANEL
Chloride: 112 mEq/L (ref 96–112)
GFR calc Af Amer: 49 mL/min — ABNORMAL LOW (ref >60)
GFR calc non Af Amer: 41 mL/min — ABNORMAL LOW (ref >60)
Potassium: 3.9 mEq/L (ref 3.5–5.1)
Sodium: 141 mEq/L (ref 135–145)

## 2010-09-11 LAB — PROTIME-INR
INR: 0.99 (ref 0.00–1.49)
Prothrombin Time: 13.3 seconds (ref 11.6–15.2)

## 2010-09-11 LAB — PREPARE RBC (CROSSMATCH)

## 2010-09-11 LAB — POCT CARDIAC MARKERS
CKMB, poc: 1.4 ng/mL (ref 1.0–8.0)
Myoglobin, poc: 91.4 ng/mL (ref 12–200)
Troponin i, poc: <0.05 ng/mL (ref 0.00–0.09)

## 2010-09-11 LAB — URINE MICROSCOPIC-ADD ON

## 2010-09-11 LAB — APTT: aPTT: 30 seconds (ref 24–37)

## 2010-09-12 LAB — URINE MICROSCOPIC-ADD ON

## 2010-09-12 LAB — GLUCOSE, CAPILLARY
Glucose-Capillary: 126 mg/dL — ABNORMAL HIGH (ref 70–99)
Glucose-Capillary: 127 mg/dL — ABNORMAL HIGH (ref 70–99)
Glucose-Capillary: 130 mg/dL — ABNORMAL HIGH (ref 70–99)
Glucose-Capillary: 141 mg/dL — ABNORMAL HIGH (ref 70–99)
Glucose-Capillary: 142 mg/dL — ABNORMAL HIGH (ref 70–99)
Glucose-Capillary: 147 mg/dL — ABNORMAL HIGH (ref 70–99)
Glucose-Capillary: 148 mg/dL — ABNORMAL HIGH (ref 70–99)
Glucose-Capillary: 150 mg/dL — ABNORMAL HIGH (ref 70–99)
Glucose-Capillary: 158 mg/dL — ABNORMAL HIGH (ref 70–99)
Glucose-Capillary: 159 mg/dL — ABNORMAL HIGH (ref 70–99)
Glucose-Capillary: 162 mg/dL — ABNORMAL HIGH (ref 70–99)
Glucose-Capillary: 166 mg/dL — ABNORMAL HIGH (ref 70–99)
Glucose-Capillary: 166 mg/dL — ABNORMAL HIGH (ref 70–99)
Glucose-Capillary: 174 mg/dL — ABNORMAL HIGH (ref 70–99)
Glucose-Capillary: 175 mg/dL — ABNORMAL HIGH (ref 70–99)
Glucose-Capillary: 178 mg/dL — ABNORMAL HIGH (ref 70–99)
Glucose-Capillary: 187 mg/dL — ABNORMAL HIGH (ref 70–99)
Glucose-Capillary: 189 mg/dL — ABNORMAL HIGH (ref 70–99)
Glucose-Capillary: 196 mg/dL — ABNORMAL HIGH (ref 70–99)
Glucose-Capillary: 197 mg/dL — ABNORMAL HIGH (ref 70–99)
Glucose-Capillary: 197 mg/dL — ABNORMAL HIGH (ref 70–99)
Glucose-Capillary: 200 mg/dL — ABNORMAL HIGH (ref 70–99)
Glucose-Capillary: 201 mg/dL — ABNORMAL HIGH (ref 70–99)
Glucose-Capillary: 210 mg/dL — ABNORMAL HIGH (ref 70–99)
Glucose-Capillary: 212 mg/dL — ABNORMAL HIGH (ref 70–99)
Glucose-Capillary: 217 mg/dL — ABNORMAL HIGH (ref 70–99)
Glucose-Capillary: 226 mg/dL — ABNORMAL HIGH (ref 70–99)
Glucose-Capillary: 230 mg/dL — ABNORMAL HIGH (ref 70–99)
Glucose-Capillary: 233 mg/dL — ABNORMAL HIGH (ref 70–99)
Glucose-Capillary: 233 mg/dL — ABNORMAL HIGH (ref 70–99)
Glucose-Capillary: 240 mg/dL — ABNORMAL HIGH (ref 70–99)
Glucose-Capillary: 243 mg/dL — ABNORMAL HIGH (ref 70–99)
Glucose-Capillary: 252 mg/dL — ABNORMAL HIGH (ref 70–99)
Glucose-Capillary: 252 mg/dL — ABNORMAL HIGH (ref 70–99)
Glucose-Capillary: 253 mg/dL — ABNORMAL HIGH (ref 70–99)
Glucose-Capillary: 259 mg/dL — ABNORMAL HIGH (ref 70–99)
Glucose-Capillary: 268 mg/dL — ABNORMAL HIGH (ref 70–99)
Glucose-Capillary: 273 mg/dL — ABNORMAL HIGH (ref 70–99)

## 2010-09-12 LAB — CARDIAC PANEL(CRET KIN+CKTOT+MB+TROPI)
Relative Index: INVALID (ref 0.0–2.5)
Relative Index: INVALID (ref 0.0–2.5)
Troponin I: 0.02 ng/mL (ref 0.00–0.06)
Troponin I: 0.02 ng/mL (ref 0.00–0.06)

## 2010-09-12 LAB — CBC
HCT: 34.8 % — ABNORMAL LOW (ref 39.0–52.0)
HCT: 36 % — ABNORMAL LOW (ref 39.0–52.0)
HCT: 36.4 % — ABNORMAL LOW (ref 39.0–52.0)
Hemoglobin: 11.4 g/dL — ABNORMAL LOW (ref 13.0–17.0)
Hemoglobin: 11.4 g/dL — ABNORMAL LOW (ref 13.0–17.0)
Hemoglobin: 11.6 g/dL — ABNORMAL LOW (ref 13.0–17.0)
Hemoglobin: 12.2 g/dL — ABNORMAL LOW (ref 13.0–17.0)
Hemoglobin: 12.8 g/dL — ABNORMAL LOW (ref 13.0–17.0)
MCH: 29.3 pg (ref 26.0–34.0)
MCH: 29.4 pg (ref 26.0–34.0)
MCH: 29.5 pg (ref 26.0–34.0)
MCH: 29.5 pg (ref 26.0–34.0)
MCHC: 33.1 g/dL (ref 30.0–36.0)
MCHC: 33.3 g/dL (ref 30.0–36.0)
MCHC: 33.5 g/dL (ref 30.0–36.0)
MCHC: 33.6 g/dL (ref 30.0–36.0)
MCV: 87.1 fL (ref 78.0–100.0)
MCV: 87.7 fL (ref 78.0–100.0)
MCV: 88.2 fL (ref 78.0–100.0)
MCV: 88.4 fL (ref 78.0–100.0)
MCV: 88.9 fL (ref 78.0–100.0)
Platelets: 259 10*3/uL (ref 150–400)
Platelets: 266 10*3/uL (ref 150–400)
Platelets: 268 10*3/uL (ref 150–400)
Platelets: 278 10*3/uL (ref 150–400)
Platelets: 283 10*3/uL (ref 150–400)
Platelets: 286 10*3/uL (ref 150–400)
RBC: 3.9 MIL/uL — ABNORMAL LOW (ref 4.22–5.81)
RBC: 3.91 MIL/uL — ABNORMAL LOW (ref 4.22–5.81)
RBC: 3.94 MIL/uL — ABNORMAL LOW (ref 4.22–5.81)
RBC: 4.19 MIL/uL — ABNORMAL LOW (ref 4.22–5.81)
RBC: 4.27 MIL/uL (ref 4.22–5.81)
RBC: 4.41 MIL/uL (ref 4.22–5.81)
RDW: 14.6 % (ref 11.5–15.5)
RDW: 14.7 % (ref 11.5–15.5)
RDW: 14.8 % (ref 11.5–15.5)
RDW: 14.8 % (ref 11.5–15.5)
RDW: 15 % (ref 11.5–15.5)
WBC: 13.2 10*3/uL — ABNORMAL HIGH (ref 4.0–10.5)
WBC: 13.4 10*3/uL — ABNORMAL HIGH (ref 4.0–10.5)
WBC: 14.8 10*3/uL — ABNORMAL HIGH (ref 4.0–10.5)
WBC: 19 10*3/uL — ABNORMAL HIGH (ref 4.0–10.5)
WBC: 21.6 10*3/uL — ABNORMAL HIGH (ref 4.0–10.5)

## 2010-09-12 LAB — COMPREHENSIVE METABOLIC PANEL
BUN: 16 mg/dL (ref 6–23)
CO2: 28 mEq/L (ref 19–32)
Chloride: 103 mEq/L (ref 96–112)
Creatinine, Ser: 1.69 mg/dL — ABNORMAL HIGH (ref 0.4–1.5)
GFR calc non Af Amer: 40 mL/min — ABNORMAL LOW (ref >60)
Total Bilirubin: 0.6 mg/dL (ref 0.3–1.2)

## 2010-09-12 LAB — DIFFERENTIAL
Basophils Absolute: 0 10*3/uL (ref 0.0–0.1)
Basophils Absolute: 0 10*3/uL (ref 0.0–0.1)
Basophils Absolute: 0.1 10*3/uL (ref 0.0–0.1)
Basophils Absolute: 0.1 10*3/uL (ref 0.0–0.1)
Basophils Absolute: 0.1 10*3/uL (ref 0.0–0.1)
Basophils Absolute: 0.2 10*3/uL — ABNORMAL HIGH (ref 0.0–0.1)
Basophils Relative: 0 % (ref 0–1)
Basophils Relative: 0 % (ref 0–1)
Basophils Relative: 0 % (ref 0–1)
Basophils Relative: 0 % (ref 0–1)
Basophils Relative: 0 % (ref 0–1)
Eosinophils Absolute: 0.3 10*3/uL (ref 0.0–0.7)
Eosinophils Absolute: 0.3 10*3/uL (ref 0.0–0.7)
Eosinophils Absolute: 0.3 10*3/uL (ref 0.0–0.7)
Eosinophils Absolute: 0.4 10*3/uL (ref 0.0–0.7)
Eosinophils Absolute: 0.5 10*3/uL (ref 0.0–0.7)
Eosinophils Absolute: 0.5 10*3/uL (ref 0.0–0.7)
Eosinophils Relative: 1 % (ref 0–5)
Eosinophils Relative: 2 % (ref 0–5)
Eosinophils Relative: 2 % (ref 0–5)
Eosinophils Relative: 2 % (ref 0–5)
Eosinophils Relative: 2 % (ref 0–5)
Eosinophils Relative: 3 % (ref 0–5)
Eosinophils Relative: 5 % (ref 0–5)
Lymphocytes Relative: 11 % — ABNORMAL LOW (ref 12–46)
Lymphocytes Relative: 11 % — ABNORMAL LOW (ref 12–46)
Lymphocytes Relative: 11 % — ABNORMAL LOW (ref 12–46)
Lymphocytes Relative: 14 % (ref 12–46)
Lymphocytes Relative: 15 % (ref 12–46)
Lymphocytes Relative: 16 % (ref 12–46)
Lymphs Abs: 1.9 10*3/uL (ref 0.7–4.0)
Lymphs Abs: 1.9 10*3/uL (ref 0.7–4.0)
Lymphs Abs: 2.1 10*3/uL (ref 0.7–4.0)
Lymphs Abs: 2.3 10*3/uL (ref 0.7–4.0)
Monocytes Absolute: 0.9 10*3/uL (ref 0.1–1.0)
Monocytes Absolute: 1 10*3/uL (ref 0.1–1.0)
Monocytes Absolute: 1.3 10*3/uL — ABNORMAL HIGH (ref 0.1–1.0)
Monocytes Absolute: 1.3 10*3/uL — ABNORMAL HIGH (ref 0.1–1.0)
Monocytes Absolute: 1.4 10*3/uL — ABNORMAL HIGH (ref 0.1–1.0)
Monocytes Absolute: 1.4 10*3/uL — ABNORMAL HIGH (ref 0.1–1.0)
Monocytes Relative: 11 % (ref 3–12)
Monocytes Relative: 5 % (ref 3–12)
Monocytes Relative: 7 % (ref 3–12)
Monocytes Relative: 8 % (ref 3–12)
Monocytes Relative: 9 % (ref 3–12)
Neutro Abs: 13.3 10*3/uL — ABNORMAL HIGH (ref 1.7–7.7)
Neutro Abs: 16.7 10*3/uL — ABNORMAL HIGH (ref 1.7–7.7)
Neutro Abs: 8.9 10*3/uL — ABNORMAL HIGH (ref 1.7–7.7)
Neutro Abs: 9.5 10*3/uL — ABNORMAL HIGH (ref 1.7–7.7)
Neutrophils Relative %: 72 % (ref 43–77)
Neutrophils Relative %: 78 % — ABNORMAL HIGH (ref 43–77)
Neutrophils Relative %: 81 % — ABNORMAL HIGH (ref 43–77)
Neutrophils Relative %: 82 % — ABNORMAL HIGH (ref 43–77)

## 2010-09-12 LAB — BASIC METABOLIC PANEL
BUN: 10 mg/dL (ref 6–23)
BUN: 13 mg/dL (ref 6–23)
CO2: 25 mEq/L (ref 19–32)
CO2: 26 mEq/L (ref 19–32)
CO2: 28 mEq/L (ref 19–32)
Calcium: 8 mg/dL — ABNORMAL LOW (ref 8.4–10.5)
Calcium: 8.3 mg/dL — ABNORMAL LOW (ref 8.4–10.5)
Calcium: 8.3 mg/dL — ABNORMAL LOW (ref 8.4–10.5)
Calcium: 8.4 mg/dL (ref 8.4–10.5)
Calcium: 8.8 mg/dL (ref 8.4–10.5)
Chloride: 106 mEq/L (ref 96–112)
Chloride: 108 mEq/L (ref 96–112)
Creatinine, Ser: 1.51 mg/dL — ABNORMAL HIGH (ref 0.4–1.5)
Creatinine, Ser: 1.55 mg/dL — ABNORMAL HIGH (ref 0.4–1.5)
Creatinine, Ser: 1.57 mg/dL — ABNORMAL HIGH (ref 0.4–1.5)
Creatinine, Ser: 1.72 mg/dL — ABNORMAL HIGH (ref 0.4–1.5)
Creatinine, Ser: 1.77 mg/dL — ABNORMAL HIGH (ref 0.4–1.5)
GFR calc Af Amer: 45 mL/min — ABNORMAL LOW (ref >60)
GFR calc Af Amer: 47 mL/min — ABNORMAL LOW (ref >60)
GFR calc Af Amer: 50 mL/min — ABNORMAL LOW (ref >60)
GFR calc Af Amer: 52 mL/min — ABNORMAL LOW (ref >60)
GFR calc Af Amer: 53 mL/min — ABNORMAL LOW (ref >60)
GFR calc Af Amer: 55 mL/min — ABNORMAL LOW (ref >60)
GFR calc non Af Amer: 38 mL/min — ABNORMAL LOW (ref >60)
GFR calc non Af Amer: 39 mL/min — ABNORMAL LOW (ref >60)
GFR calc non Af Amer: 41 mL/min — ABNORMAL LOW (ref >60)
GFR calc non Af Amer: 43 mL/min — ABNORMAL LOW (ref >60)
GFR calc non Af Amer: 44 mL/min — ABNORMAL LOW (ref >60)
GFR calc non Af Amer: 45 mL/min — ABNORMAL LOW (ref >60)
Glucose, Bld: 129 mg/dL — ABNORMAL HIGH (ref 70–99)
Glucose, Bld: 167 mg/dL — ABNORMAL HIGH (ref 70–99)
Glucose, Bld: 171 mg/dL — ABNORMAL HIGH (ref 70–99)
Potassium: 3.3 mEq/L — ABNORMAL LOW (ref 3.5–5.1)
Potassium: 3.6 mEq/L (ref 3.5–5.1)
Potassium: 3.9 mEq/L (ref 3.5–5.1)
Sodium: 136 mEq/L (ref 135–145)
Sodium: 137 mEq/L (ref 135–145)
Sodium: 138 mEq/L (ref 135–145)
Sodium: 140 mEq/L (ref 135–145)
Sodium: 140 mEq/L (ref 135–145)
Sodium: 140 mEq/L (ref 135–145)

## 2010-09-12 LAB — STOOL CULTURE

## 2010-09-12 LAB — CLOSTRIDIUM DIFFICILE EIA: C difficile Toxins A+B, EIA: NEGATIVE

## 2010-09-12 LAB — ABO/RH: ABO/RH(D): A POS

## 2010-09-12 LAB — URINE CULTURE
Colony Count: NO GROWTH
Culture  Setup Time: 201108060248
Culture: NO GROWTH
Special Requests: NEGATIVE

## 2010-09-12 LAB — CROSSMATCH

## 2010-09-12 LAB — URINALYSIS, ROUTINE W REFLEX MICROSCOPIC
Bilirubin Urine: NEGATIVE
Glucose, UA: 250 mg/dL — AB
Hgb urine dipstick: NEGATIVE
Ketones, ur: NEGATIVE mg/dL
Leukocytes, UA: NEGATIVE
Nitrite: NEGATIVE
Protein, ur: 100 mg/dL — AB
Specific Gravity, Urine: >1.03 — ABNORMAL HIGH (ref 1.005–1.030)
Urobilinogen, UA: 0.2 mg/dL (ref 0.0–1.0)
pH: 5 (ref 5.0–8.0)

## 2010-09-12 LAB — CULTURE, BLOOD (ROUTINE X 2)
Culture: NO GROWTH
Report Status: 8062011
Report Status: 8062011

## 2010-09-12 LAB — MAGNESIUM
Magnesium: 1.7 mg/dL (ref 1.5–2.5)
Magnesium: 1.8 mg/dL (ref 1.5–2.5)
Magnesium: 1.8 mg/dL (ref 1.5–2.5)

## 2010-09-12 LAB — HEMOGLOBIN AND HEMATOCRIT, BLOOD: HCT: 38 % — ABNORMAL LOW (ref 39.0–52.0)

## 2010-09-12 LAB — HEMOCCULT GUIAC POC 1CARD (OFFICE): Fecal Occult Bld: POSITIVE

## 2010-09-13 LAB — GLUCOSE, CAPILLARY
Glucose-Capillary: 159 mg/dL — ABNORMAL HIGH (ref 70–99)
Glucose-Capillary: 168 mg/dL — ABNORMAL HIGH (ref 70–99)
Glucose-Capillary: 172 mg/dL — ABNORMAL HIGH (ref 70–99)
Glucose-Capillary: 185 mg/dL — ABNORMAL HIGH (ref 70–99)
Glucose-Capillary: 191 mg/dL — ABNORMAL HIGH (ref 70–99)
Glucose-Capillary: 202 mg/dL — ABNORMAL HIGH (ref 70–99)
Glucose-Capillary: 219 mg/dL — ABNORMAL HIGH (ref 70–99)
Glucose-Capillary: 219 mg/dL — ABNORMAL HIGH (ref 70–99)
Glucose-Capillary: 261 mg/dL — ABNORMAL HIGH (ref 70–99)
Glucose-Capillary: 271 mg/dL — ABNORMAL HIGH (ref 70–99)
Glucose-Capillary: 284 mg/dL — ABNORMAL HIGH (ref 70–99)
Glucose-Capillary: 293 mg/dL — ABNORMAL HIGH (ref 70–99)
Glucose-Capillary: 309 mg/dL — ABNORMAL HIGH (ref 70–99)
Glucose-Capillary: 310 mg/dL — ABNORMAL HIGH (ref 70–99)
Glucose-Capillary: 311 mg/dL — ABNORMAL HIGH (ref 70–99)
Glucose-Capillary: 342 mg/dL — ABNORMAL HIGH (ref 70–99)

## 2010-09-13 LAB — POCT CARDIAC MARKERS
CKMB, poc: 2.2 ng/mL (ref 1.0–8.0)
Myoglobin, poc: 198 ng/mL (ref 12–200)
Troponin i, poc: <0.05 ng/mL (ref 0.00–0.09)

## 2010-09-13 LAB — CULTURE, BLOOD (ROUTINE X 2)
Culture: NO GROWTH
Report Status: 7252011

## 2010-09-13 LAB — DIFFERENTIAL
Basophils Absolute: 0 10*3/uL (ref 0.0–0.1)
Basophils Absolute: 0.1 10*3/uL (ref 0.0–0.1)
Basophils Absolute: 0.1 10*3/uL (ref 0.0–0.1)
Basophils Absolute: 0.2 K/uL — ABNORMAL HIGH (ref 0.0–0.1)
Basophils Relative: 0 % (ref 0–1)
Basophils Relative: 1 % (ref 0–1)
Basophils Relative: 1 % (ref 0–1)
Eosinophils Absolute: 0.1 10*3/uL (ref 0.0–0.7)
Eosinophils Absolute: 0.3 10*3/uL (ref 0.0–0.7)
Eosinophils Relative: 0 % (ref 0–5)
Eosinophils Relative: 1 % (ref 0–5)
Eosinophils Relative: 1 % (ref 0–5)
Eosinophils Relative: 2 % (ref 0–5)
Eosinophils Relative: 3 % (ref 0–5)
Eosinophils Relative: 3 % (ref 0–5)
Lymphocytes Relative: 12 % (ref 12–46)
Lymphocytes Relative: 13 % (ref 12–46)
Lymphocytes Relative: 14 % (ref 12–46)
Lymphocytes Relative: 6 % — ABNORMAL LOW (ref 12–46)
Lymphocytes Relative: 7 % — ABNORMAL LOW (ref 12–46)
Lymphs Abs: 1.3 10*3/uL (ref 0.7–4.0)
Lymphs Abs: 1.7 10*3/uL (ref 0.7–4.0)
Lymphs Abs: 1.7 10*3/uL (ref 0.7–4.0)
Lymphs Abs: 1.7 10*3/uL (ref 0.7–4.0)
Lymphs Abs: 2.2 10*3/uL (ref 0.7–4.0)
Monocytes Absolute: 0.8 10*3/uL (ref 0.1–1.0)
Monocytes Absolute: 0.8 10*3/uL (ref 0.1–1.0)
Monocytes Absolute: 0.8 10*3/uL (ref 0.1–1.0)
Monocytes Absolute: 1 10*3/uL (ref 0.1–1.0)
Monocytes Absolute: 1.2 K/uL — ABNORMAL HIGH (ref 0.1–1.0)
Monocytes Relative: 5 % (ref 3–12)
Monocytes Relative: 7 % (ref 3–12)
Neutro Abs: 17.4 10*3/uL — ABNORMAL HIGH (ref 1.7–7.7)
Neutro Abs: 19.6 K/uL — ABNORMAL HIGH (ref 1.7–7.7)
Neutro Abs: 7.9 10*3/uL — ABNORMAL HIGH (ref 1.7–7.7)
Neutro Abs: 8.2 10*3/uL — ABNORMAL HIGH (ref 1.7–7.7)
Neutro Abs: 9.4 10*3/uL — ABNORMAL HIGH (ref 1.7–7.7)
Neutro Abs: 9.9 10*3/uL — ABNORMAL HIGH (ref 1.7–7.7)
Neutrophils Relative %: 79 % — ABNORMAL HIGH (ref 43–77)
Neutrophils Relative %: 86 % — ABNORMAL HIGH (ref 43–77)
Neutrophils Relative %: 86 % — ABNORMAL HIGH (ref 43–77)

## 2010-09-13 LAB — LIPID PANEL
LDL Cholesterol: 39 mg/dL (ref 0–99)
Total CHOL/HDL Ratio: 2.9 RATIO
Triglycerides: 199 mg/dL — ABNORMAL HIGH (ref <150)
VLDL: 40 mg/dL (ref 0–40)

## 2010-09-13 LAB — CBC
HCT: 37.1 % — ABNORMAL LOW (ref 39.0–52.0)
HCT: 37.7 % — ABNORMAL LOW (ref 39.0–52.0)
HCT: 38 % — ABNORMAL LOW (ref 39.0–52.0)
HCT: 38.5 % — ABNORMAL LOW (ref 39.0–52.0)
HCT: 38.9 % — ABNORMAL LOW (ref 39.0–52.0)
Hemoglobin: 12.7 g/dL — ABNORMAL LOW (ref 13.0–17.0)
Hemoglobin: 13.1 g/dL (ref 13.0–17.0)
MCH: 29.3 pg (ref 26.0–34.0)
MCH: 29.4 pg (ref 26.0–34.0)
MCHC: 33.4 g/dL (ref 30.0–36.0)
MCHC: 33.4 g/dL (ref 30.0–36.0)
MCHC: 33.6 g/dL (ref 30.0–36.0)
MCV: 87.2 fL (ref 78.0–100.0)
MCV: 87.2 fL (ref 78.0–100.0)
MCV: 87.3 fL (ref 78.0–100.0)
MCV: 87.5 fL (ref 78.0–100.0)
MCV: 87.6 fL (ref 78.0–100.0)
MCV: 87.8 fL (ref 78.0–100.0)
MCV: 87.9 fL (ref 78.0–100.0)
Platelets: 241 10*3/uL (ref 150–400)
Platelets: 271 10*3/uL (ref 150–400)
Platelets: 286 10*3/uL (ref 150–400)
Platelets: 295 10*3/uL (ref 150–400)
Platelets: 317 10*3/uL (ref 150–400)
RBC: 4 MIL/uL — ABNORMAL LOW (ref 4.22–5.81)
RBC: 4.29 MIL/uL (ref 4.22–5.81)
RBC: 4.32 MIL/uL (ref 4.22–5.81)
RBC: 4.39 MIL/uL (ref 4.22–5.81)
RBC: 4.62 MIL/uL (ref 4.22–5.81)
RDW: 14.2 % (ref 11.5–15.5)
RDW: 14.7 % (ref 11.5–15.5)
RDW: 14.8 % (ref 11.5–15.5)
WBC: 12.2 10*3/uL — ABNORMAL HIGH (ref 4.0–10.5)
WBC: 12.6 10*3/uL — ABNORMAL HIGH (ref 4.0–10.5)
WBC: 17.7 10*3/uL — ABNORMAL HIGH (ref 4.0–10.5)
WBC: 20.2 10*3/uL — ABNORMAL HIGH (ref 4.0–10.5)
WBC: 22.8 K/uL — ABNORMAL HIGH (ref 4.0–10.5)

## 2010-09-13 LAB — URINALYSIS, ROUTINE W REFLEX MICROSCOPIC
Bilirubin Urine: NEGATIVE
Glucose, UA: >1000 mg/dL — AB
Hgb urine dipstick: NEGATIVE
Ketones, ur: NEGATIVE mg/dL
Leukocytes, UA: NEGATIVE
Nitrite: NEGATIVE
Protein, ur: 30 mg/dL — AB
Specific Gravity, Urine: 1.025 (ref 1.005–1.030)
Urobilinogen, UA: 0.2 mg/dL (ref 0.0–1.0)
pH: 5.5 (ref 5.0–8.0)

## 2010-09-13 LAB — COMPREHENSIVE METABOLIC PANEL WITH GFR
AST: 14 U/L (ref 0–37)
Albumin: 3 g/dL — ABNORMAL LOW (ref 3.5–5.2)
Alkaline Phosphatase: 55 U/L (ref 39–117)
BUN: 23 mg/dL (ref 6–23)
CO2: 27 meq/L (ref 19–32)
Chloride: 99 meq/L (ref 96–112)
Creatinine, Ser: 1.82 mg/dL — ABNORMAL HIGH (ref 0.4–1.5)
GFR calc non Af Amer: 36 mL/min — ABNORMAL LOW (ref >60)
Potassium: 3.7 meq/L (ref 3.5–5.1)
Total Bilirubin: 0.5 mg/dL (ref 0.3–1.2)

## 2010-09-13 LAB — BASIC METABOLIC PANEL
BUN: 17 mg/dL (ref 6–23)
BUN: 20 mg/dL (ref 6–23)
CO2: 28 mEq/L (ref 19–32)
Calcium: 8.4 mg/dL (ref 8.4–10.5)
Calcium: 8.9 mg/dL (ref 8.4–10.5)
Chloride: 103 mEq/L (ref 96–112)
Chloride: 106 mEq/L (ref 96–112)
Chloride: 107 mEq/L (ref 96–112)
Chloride: 99 mEq/L (ref 96–112)
Creatinine, Ser: 1.36 mg/dL (ref 0.4–1.5)
Creatinine, Ser: 1.4 mg/dL (ref 0.4–1.5)
Creatinine, Ser: 1.45 mg/dL (ref 0.4–1.5)
Creatinine, Ser: 1.52 mg/dL — ABNORMAL HIGH (ref 0.4–1.5)
GFR calc Af Amer: 54 mL/min — ABNORMAL LOW (ref >60)
GFR calc Af Amer: 54 mL/min — ABNORMAL LOW (ref >60)
GFR calc Af Amer: 57 mL/min — ABNORMAL LOW (ref >60)
GFR calc Af Amer: 60 mL/min — ABNORMAL LOW (ref >60)
GFR calc Af Amer: >60 mL/min (ref >60)
GFR calc non Af Amer: 45 mL/min — ABNORMAL LOW (ref >60)
GFR calc non Af Amer: 49 mL/min — ABNORMAL LOW (ref >60)
GFR calc non Af Amer: 51 mL/min — ABNORMAL LOW (ref >60)
GFR calc non Af Amer: 53 mL/min — ABNORMAL LOW (ref >60)
GFR calc non Af Amer: 54 mL/min — ABNORMAL LOW (ref >60)
Glucose, Bld: 224 mg/dL — ABNORMAL HIGH (ref 70–99)
Glucose, Bld: 254 mg/dL — ABNORMAL HIGH (ref 70–99)
Potassium: 3.4 mEq/L — ABNORMAL LOW (ref 3.5–5.1)
Potassium: 3.4 mEq/L — ABNORMAL LOW (ref 3.5–5.1)
Potassium: 3.5 mEq/L (ref 3.5–5.1)
Potassium: 3.9 mEq/L (ref 3.5–5.1)
Sodium: 137 mEq/L (ref 135–145)

## 2010-09-13 LAB — URINE MICROSCOPIC-ADD ON

## 2010-09-13 LAB — CARDIAC PANEL(CRET KIN+CKTOT+MB+TROPI)
CK, MB: 2.6 ng/mL (ref 0.3–4.0)
Total CK: 84 U/L (ref 7–232)
Troponin I: 0.05 ng/mL (ref 0.00–0.06)
Troponin I: 0.05 ng/mL (ref 0.00–0.06)

## 2010-09-13 LAB — COMPREHENSIVE METABOLIC PANEL
ALT: 17 U/L (ref 0–53)
Calcium: 8.7 mg/dL (ref 8.4–10.5)
GFR calc Af Amer: 44 mL/min — ABNORMAL LOW (ref >60)
Glucose, Bld: 351 mg/dL — ABNORMAL HIGH (ref 70–99)
Sodium: 130 mEq/L — ABNORMAL LOW (ref 135–145)
Total Protein: 5.6 g/dL — ABNORMAL LOW (ref 6.0–8.3)

## 2010-09-13 LAB — URINE CULTURE
Colony Count: 15000
Culture  Setup Time: 201108012250

## 2010-09-13 LAB — PROTIME-INR
INR: 1.03 (ref 0.00–1.49)
Prothrombin Time: 13.4 seconds (ref 11.6–15.2)

## 2010-09-13 LAB — SAMPLE TO BLOOD BANK

## 2010-09-13 LAB — LIPASE, BLOOD: Lipase: 33 U/L (ref 11–59)

## 2010-09-13 LAB — BRAIN NATRIURETIC PEPTIDE: Pro B Natriuretic peptide (BNP): 41 pg/mL (ref 0.0–100.0)

## 2010-09-13 LAB — TSH: TSH: 0.686 u[IU]/mL (ref 0.350–4.500)

## 2010-09-13 LAB — APTT: aPTT: 27 s (ref 24–37)

## 2010-09-14 LAB — DIFFERENTIAL
Basophils Relative: 1 % (ref 0–1)
Lymphs Abs: 1.6 10*3/uL (ref 0.7–4.0)
Monocytes Absolute: 0.6 10*3/uL (ref 0.1–1.0)
Monocytes Relative: 6 % (ref 3–12)
Neutro Abs: 7 10*3/uL (ref 1.7–7.7)

## 2010-09-14 LAB — COMPREHENSIVE METABOLIC PANEL
ALT: 18 U/L (ref 0–53)
Albumin: 3.5 g/dL (ref 3.5–5.2)
Alkaline Phosphatase: 53 U/L (ref 39–117)
Potassium: 3.7 mEq/L (ref 3.5–5.1)
Sodium: 139 mEq/L (ref 135–145)
Total Protein: 6.6 g/dL (ref 6.0–8.3)

## 2010-09-14 LAB — CBC
Platelets: 319 10*3/uL (ref 150–400)
RDW: 15.3 % (ref 11.5–15.5)

## 2010-09-14 LAB — POCT CARDIAC MARKERS: CKMB, poc: 1.9 ng/mL (ref 1.0–8.0)

## 2010-10-04 LAB — BASIC METABOLIC PANEL
BUN: 14 mg/dL (ref 6–23)
BUN: 30 mg/dL — ABNORMAL HIGH (ref 6–23)
Calcium: 8.9 mg/dL (ref 8.4–10.5)
Chloride: 101 mEq/L (ref 96–112)
Chloride: 101 mEq/L (ref 96–112)
Creatinine, Ser: 1.57 mg/dL — ABNORMAL HIGH (ref 0.4–1.5)
GFR calc Af Amer: 54 mL/min — ABNORMAL LOW (ref >60)
GFR calc Af Amer: >60 mL/min (ref >60)
GFR calc non Af Amer: 43 mL/min — ABNORMAL LOW (ref >60)
GFR calc non Af Amer: 50 mL/min — ABNORMAL LOW (ref >60)
GFR calc non Af Amer: 51 mL/min — ABNORMAL LOW (ref >60)
Glucose, Bld: 162 mg/dL — ABNORMAL HIGH (ref 70–99)
Glucose, Bld: 228 mg/dL — ABNORMAL HIGH (ref 70–99)
Potassium: 3.3 mEq/L — ABNORMAL LOW (ref 3.5–5.1)
Potassium: 3.5 mEq/L (ref 3.5–5.1)
Potassium: 4 mEq/L (ref 3.5–5.1)
Sodium: 139 mEq/L (ref 135–145)
Sodium: 142 mEq/L (ref 135–145)

## 2010-10-04 LAB — GLUCOSE, CAPILLARY
Glucose-Capillary: 157 mg/dL — ABNORMAL HIGH (ref 70–99)
Glucose-Capillary: 167 mg/dL — ABNORMAL HIGH (ref 70–99)
Glucose-Capillary: 208 mg/dL — ABNORMAL HIGH (ref 70–99)
Glucose-Capillary: 209 mg/dL — ABNORMAL HIGH (ref 70–99)
Glucose-Capillary: 225 mg/dL — ABNORMAL HIGH (ref 70–99)
Glucose-Capillary: 229 mg/dL — ABNORMAL HIGH (ref 70–99)
Glucose-Capillary: 231 mg/dL — ABNORMAL HIGH (ref 70–99)
Glucose-Capillary: 262 mg/dL — ABNORMAL HIGH (ref 70–99)
Glucose-Capillary: 337 mg/dL — ABNORMAL HIGH (ref 70–99)
Glucose-Capillary: 373 mg/dL — ABNORMAL HIGH (ref 70–99)

## 2010-10-04 LAB — COMPREHENSIVE METABOLIC PANEL
ALT: 29 U/L (ref 0–53)
Albumin: 3.3 g/dL — ABNORMAL LOW (ref 3.5–5.2)
BUN: 32 mg/dL — ABNORMAL HIGH (ref 6–23)
Chloride: 100 mEq/L (ref 96–112)
GFR calc non Af Amer: 40 mL/min — ABNORMAL LOW (ref >60)
Potassium: 3.7 mEq/L (ref 3.5–5.1)
Sodium: 138 mEq/L (ref 135–145)
Total Protein: 6.1 g/dL (ref 6.0–8.3)

## 2010-10-04 LAB — CK TOTAL AND CKMB (NOT AT ARMC)
CK, MB: 3.5 ng/mL (ref 0.3–4.0)
Relative Index: 2.8 — ABNORMAL HIGH (ref 0.0–2.5)

## 2010-10-04 LAB — D-DIMER, QUANTITATIVE: D-Dimer, Quant: 0.48 ug/mL-FEU (ref 0.00–0.48)

## 2010-10-04 LAB — CBC
HCT: 35.5 % — ABNORMAL LOW (ref 39.0–52.0)
HCT: 39.2 % (ref 39.0–52.0)
HCT: 39.9 % (ref 39.0–52.0)
HCT: 40.3 % (ref 39.0–52.0)
Hemoglobin: 13.6 g/dL (ref 13.0–17.0)
Hemoglobin: 13.7 g/dL (ref 13.0–17.0)
MCV: 87.9 fL (ref 78.0–100.0)
Platelets: 236 10*3/uL (ref 150–400)
Platelets: 292 10*3/uL (ref 150–400)
RBC: 4.47 MIL/uL (ref 4.22–5.81)
RBC: 4.52 MIL/uL (ref 4.22–5.81)
RDW: 15 % (ref 11.5–15.5)
RDW: 15.3 % (ref 11.5–15.5)
WBC: 13.4 10*3/uL — ABNORMAL HIGH (ref 4.0–10.5)
WBC: 14.3 10*3/uL — ABNORMAL HIGH (ref 4.0–10.5)
WBC: 9.5 10*3/uL (ref 4.0–10.5)

## 2010-10-04 LAB — DIFFERENTIAL
Basophils Absolute: 0.1 10*3/uL (ref 0.0–0.1)
Basophils Absolute: 0.1 10*3/uL (ref 0.0–0.1)
Eosinophils Absolute: 0 10*3/uL (ref 0.0–0.7)
Eosinophils Relative: 0 % (ref 0–5)
Eosinophils Relative: 5 % (ref 0–5)
Lymphocytes Relative: 11 % — ABNORMAL LOW (ref 12–46)
Lymphocytes Relative: 14 % (ref 12–46)
Lymphocytes Relative: 21 % (ref 12–46)
Lymphocytes Relative: 22 % (ref 12–46)
Lymphs Abs: 1.6 10*3/uL (ref 0.7–4.0)
Lymphs Abs: 1.9 10*3/uL (ref 0.7–4.0)
Lymphs Abs: 2 10*3/uL (ref 0.7–4.0)
Monocytes Relative: 7 % (ref 3–12)
Neutro Abs: 6.4 10*3/uL (ref 1.7–7.7)
Neutrophils Relative %: 67 % (ref 43–77)
Neutrophils Relative %: 77 % (ref 43–77)

## 2010-10-04 LAB — CARDIAC PANEL(CRET KIN+CKTOT+MB+TROPI)
CK, MB: 3.1 ng/mL (ref 0.3–4.0)
Relative Index: INVALID (ref 0.0–2.5)
Total CK: 106 U/L (ref 7–232)
Total CK: 83 U/L (ref 7–232)
Troponin I: 0.03 ng/mL (ref 0.00–0.06)
Troponin I: 0.03 ng/mL (ref 0.00–0.06)
Troponin I: 0.04 ng/mL (ref 0.00–0.06)

## 2010-10-04 LAB — BLOOD GAS, ARTERIAL
Acid-Base Excess: 4.9 mmol/L — ABNORMAL HIGH (ref 0.0–2.0)
Bicarbonate: 29.6 mEq/L — ABNORMAL HIGH (ref 20.0–24.0)
TCO2: 26.6 mmol/L (ref 0–100)
pCO2 arterial: 50.3 mmHg — ABNORMAL HIGH (ref 35.0–45.0)
pO2, Arterial: 57.7 mmHg — ABNORMAL LOW (ref 80.0–100.0)

## 2010-10-04 LAB — SEDIMENTATION RATE
Sed Rate: 4 mm/hr (ref 0–16)
Sed Rate: 8 mm/hr (ref 0–16)

## 2010-10-04 LAB — POCT CARDIAC MARKERS
CKMB, poc: 1.6 ng/mL (ref 1.0–8.0)
Myoglobin, poc: 150 ng/mL (ref 12–200)

## 2010-10-04 LAB — URINALYSIS, ROUTINE W REFLEX MICROSCOPIC
Bilirubin Urine: NEGATIVE
Glucose, UA: NEGATIVE mg/dL
Hgb urine dipstick: NEGATIVE
Protein, ur: 100 mg/dL — AB
Urobilinogen, UA: 0.2 mg/dL (ref 0.0–1.0)

## 2010-10-04 LAB — TSH: TSH: 0.171 u[IU]/mL — ABNORMAL LOW (ref 0.350–4.500)

## 2010-10-04 LAB — BRAIN NATRIURETIC PEPTIDE
Pro B Natriuretic peptide (BNP): <30 pg/mL (ref 0.0–100.0)
Pro B Natriuretic peptide (BNP): <30 pg/mL (ref 0.0–100.0)

## 2010-10-04 LAB — HEMOGLOBIN A1C
Hgb A1c MFr Bld: 8.1 % — ABNORMAL HIGH (ref 4.6–6.1)
Hgb A1c MFr Bld: 8.7 % — ABNORMAL HIGH (ref 4.6–6.1)
Mean Plasma Glucose: 186 mg/dL

## 2010-10-04 LAB — RPR: RPR Ser Ql: NONREACTIVE

## 2010-10-04 LAB — TROPONIN I: Troponin I: 0.03 ng/mL (ref 0.00–0.06)

## 2010-10-04 LAB — URINE MICROSCOPIC-ADD ON

## 2010-10-04 LAB — T4, FREE: Free T4: 0.87 ng/dL (ref 0.80–1.80)

## 2010-10-04 LAB — FOLATE: Folate: >20 ng/mL

## 2010-10-05 LAB — URINALYSIS, ROUTINE W REFLEX MICROSCOPIC
Glucose, UA: NEGATIVE mg/dL
Ketones, ur: NEGATIVE mg/dL
Leukocytes, UA: NEGATIVE
pH: 6 (ref 5.0–8.0)

## 2010-10-05 LAB — URINE CULTURE

## 2010-10-05 LAB — BASIC METABOLIC PANEL
BUN: 16 mg/dL (ref 6–23)
CO2: 26 mEq/L (ref 19–32)
Chloride: 102 mEq/L (ref 96–112)
Creatinine, Ser: 1.26 mg/dL (ref 0.4–1.5)
Glucose, Bld: 206 mg/dL — ABNORMAL HIGH (ref 70–99)

## 2010-10-05 LAB — DIFFERENTIAL
Basophils Absolute: 0.1 10*3/uL (ref 0.0–0.1)
Basophils Relative: 1 % (ref 0–1)
Eosinophils Absolute: 0.2 10*3/uL (ref 0.0–0.7)
Neutrophils Relative %: 80 % — ABNORMAL HIGH (ref 43–77)

## 2010-10-05 LAB — CBC
MCHC: 34.4 g/dL (ref 30.0–36.0)
MCV: 88.1 fL (ref 78.0–100.0)
Platelets: 283 10*3/uL (ref 150–400)
RDW: 16.2 % — ABNORMAL HIGH (ref 11.5–15.5)
WBC: 14.6 10*3/uL — ABNORMAL HIGH (ref 4.0–10.5)

## 2010-10-06 LAB — DIFFERENTIAL
Eosinophils Absolute: 0.3 10*3/uL (ref 0.0–0.7)
Eosinophils Relative: 3 % (ref 0–5)
Lymphs Abs: 1.9 10*3/uL (ref 0.7–4.0)
Monocytes Absolute: 0.6 10*3/uL (ref 0.1–1.0)

## 2010-10-06 LAB — CBC
Platelets: 258 10*3/uL (ref 150–400)
RBC: 4.52 MIL/uL (ref 4.22–5.81)
WBC: 9.5 10*3/uL (ref 4.0–10.5)

## 2010-10-06 LAB — COMPREHENSIVE METABOLIC PANEL
ALT: 22 U/L (ref 0–53)
AST: 21 U/L (ref 0–37)
Albumin: 3.3 g/dL — ABNORMAL LOW (ref 3.5–5.2)
CO2: 32 mEq/L (ref 19–32)
Calcium: 8.9 mg/dL (ref 8.4–10.5)
Chloride: 103 mEq/L (ref 96–112)
GFR calc Af Amer: >60 mL/min (ref >60)
GFR calc non Af Amer: 56 mL/min — ABNORMAL LOW (ref >60)
Sodium: 139 mEq/L (ref 135–145)
Total Bilirubin: 0.8 mg/dL (ref 0.3–1.2)

## 2010-10-06 LAB — POCT CARDIAC MARKERS: Troponin i, poc: <0.05 ng/mL (ref 0.00–0.09)

## 2010-10-15 ENCOUNTER — Other Ambulatory Visit: Payer: Self-pay | Admitting: Gastroenterology

## 2010-10-15 ENCOUNTER — Telehealth: Payer: Self-pay

## 2010-10-15 ENCOUNTER — Other Ambulatory Visit: Payer: Self-pay

## 2010-10-15 DIAGNOSIS — K921 Melena: Secondary | ICD-10-CM

## 2010-10-15 NOTE — Telephone Encounter (Signed)
Await labs.

## 2010-10-15 NOTE — Telephone Encounter (Signed)
Pt came to office. He is having dark stools, only having 1-2 bms a week. Having a lot of weakness and abd swelling esp after eating. He has had no change in meds. Not taking pepto or had change in any medications. Spoke with lsl- ok to do H/H today, pt needs ov tomorrow and advised to go to ED if he notices continued black stools, dizziness or SOB. Pt informed, lab order given, pt cannot come tomorrow because he already has 2 Dr. Tyrell Antonio, SS to make appt asap.

## 2010-10-16 LAB — HEMOGLOBIN: Hemoglobin: 12 g/dL — ABNORMAL LOW (ref 13.0–17.0)

## 2010-10-17 ENCOUNTER — Other Ambulatory Visit: Payer: Self-pay | Admitting: Internal Medicine

## 2010-10-17 ENCOUNTER — Encounter: Payer: Self-pay | Admitting: Gastroenterology

## 2010-10-17 ENCOUNTER — Ambulatory Visit (INDEPENDENT_AMBULATORY_CARE_PROVIDER_SITE_OTHER): Payer: Medicare Other | Admitting: Gastroenterology

## 2010-10-17 VITALS — BP 170/80 | HR 83 | Temp 98.2°F | Ht 67.0 in | Wt 252.0 lb

## 2010-10-17 DIAGNOSIS — K921 Melena: Secondary | ICD-10-CM | POA: Insufficient documentation

## 2010-10-17 DIAGNOSIS — R1032 Left lower quadrant pain: Secondary | ICD-10-CM

## 2010-10-17 DIAGNOSIS — K5900 Constipation, unspecified: Secondary | ICD-10-CM | POA: Insufficient documentation

## 2010-10-17 MED ORDER — POLYETHYLENE GLYCOL 3350 POWD: 17.0000 g | Freq: Every day | Status: DC | PRN

## 2010-10-17 NOTE — Progress Notes (Signed)
Primary Care Physician:  Colette Ribas, MD  Primary Gastroenterologist:  Dr. Roetta Sessions  Chief Complaint  Patient presents with  . Abdominal Pain    stools are black    HPI:  John [name] is a 77 y.o. male here for further evaluation of abdominal pain and black stools. He does have a history of GI bleeding in the past. Please see past medical history for details. He dropped in 2 days ago complaining of black stools. We sent him for stat H&H. His hemoglobin was 12 which was stable from last fall. We wanted to see him the following day but he had to postpone his office visit until today. Patient has a history of GI bleeding in 2006 as well as in September 2011. Both times he was using NSAIDs. He also had ischemic colitis last year based on biopsies. Was treated with Flagyl and vancomycin as well.  Complains of PP abdominal pain, mid-abdomen. Recently his Hytrin capsules changed colors, pharmacist warned him but he thinks it is why he feels differently. Last night felt so weak, thought about going to ER. Slept good last night. Woke up this morning, sheets/clothes were wet. LLQ pain. Some nausea. Eats and then gets hungry, eats again. BM black at least twice a week. Only two BM weekly. No brown stool. No evidence of red tinge to toilet, stool. No n/v. No significant heartburn. Lots of headaches, was taking Aleve ("just for a few days"), now on Tylenol.    Current Outpatient Prescriptions  Medication Sig Dispense Refill  . amLODipine (NORVASC) 10 MG tablet Take 10 mg by mouth daily.        . Choline Fenofibrate (TRILIPIX) 135 MG capsule Take 135 mg by mouth daily.        . furosemide (LASIX) 40 MG tablet Take 20 mg by mouth daily.        . iron polysaccharides (NIFEREX) 150 MG capsule Take 150 mg by mouth 2 (two) times daily.        . Multiple Vitamins-Minerals (ONE-A-DAY 50 PLUS PO) Take 1 tablet by mouth daily.        . nebivolol (BYSTOLIC) 5 MG tablet Take 2.5 mg by mouth daily.          . pantoprazole (PROTONIX) 40 MG tablet Take 40 mg by mouth daily.        . potassium chloride (K-DUR) 10 MEQ tablet Take 10 mEq by mouth daily.        . ramipril (ALTACE) 10 MG capsule Take 10 mg by mouth 3 (three) times daily.        . sitaGLIPtan (JANUVIA) 100 MG tablet Take 50 mg by mouth daily.        Marland Kitchen terazosin (HYTRIN) 5 MG capsule Take 5 mg by mouth at bedtime.        . Polyethylene Glycol 3350 POWD Take 17 g by mouth daily as needed.  527 g  5    Allergies as of 10/17/2010 - Review Complete 10/17/2010  Allergen Reaction Noted  . Latex    . Povidone-iodine    . Propoxyphene hcl    . Sulfonamide derivatives      Past Medical History  Diagnosis Date  . GI bleed     2006, TCS/TI showed small polyp. EGD showed erosive antral gastritis with two polyp, one oozing and tx. HP neg. SB capsule shoed few jejunal ulcers with bleeding (naproxen and ASA)  . GI bleed     02/2010, EGD->pancreatitc rest,  fundal gland polyps,  no bleeding. TCS->blood-tinged colonic effluent throughout the colon without bleeding lesion found, nl TI. SB capsule->SB erosions, nonbleeding, incomplete study. Patient on naproxyn.   . DM (diabetes mellitus)   . Pneumonia   . Hyperlipidemia   . HTN (hypertension)   . Stroke   . GERD (gastroesophageal reflux disease)   . Poor vision     left eye  . Nephrolithiasis   . Sleep apnea   . Obesity   . Ischemic colitis, enteritis, or enterocolitis     flex sig 01/2010  . Chronic renal insufficiency     Past Surgical History  Procedure Date  . Back surgery   . Transurethral resection of prostate   . Cataract extraction     bilateral  . Appendectomy   . Cholecystectomy   . Elbow surgery     Family History  Problem Relation Age of Onset  . Aneurysm Daughter     brain, deceased age 3  . GI problems Brother     gi bleed  . Cancer Sister     unknown type  . Colon cancer Neg Hx   . Liver disease Neg Hx     History   Social History  . Marital Status:  Widowed    Spouse Name: N/A    Number of Children: 2  . Years of Education: N/A   Occupational History  . retired     [employer]   Social History Main Topics  . Smoking status: Never Smoker   . Smokeless tobacco: Never Used  . Alcohol Use: No  . Drug Use: No  . Sexually Active: No      ROS:  General: Negative for anorexia, weight loss, fever, chills, fatigue, weakness. Feels tired. Eyes: Negative for vision changes.  ENT: Negative for hoarseness, difficulty swallowing , nasal congestion. CV: Negative for chest pain, angina, palpitations, dyspnea on exertion, peripheral edema.  Respiratory: Negative for dyspnea at rest, dyspnea on exertion, cough, sputum, wheezing.  GI: See history of present illness. GU:  Negative for dysuria, hematuria, urinary incontinence, urinary frequency, nocturnal urination.  MS: Negative for joint pain, low back pain.  Derm: Negative for rash or itching.  Neuro: Negative for weakness, abnormal sensation, seizure, frequent headaches, memory loss, confusion.  Psych: Negative for anxiety, depression, suicidal ideation, hallucinations.  Endo: Negative for unusual weight change.  Heme: Negative for bruising or bleeding. Allergy: Negative for rash or hives.    Physical Examination:  BP 170/80  Pulse 83  Temp 98.2 F (36.8 C)  Ht 5\' 7"  (1.702 m)  Wt 252 lb (114.306 kg)  BMI 39.47 kg/m2   General: Well-nourished, well-developed in no acute distress.  Head: Normocephalic, atraumatic.   Eyes: Conjunctiva pink, no icterus. Mouth: Oropharyngeal mucosa moist and pink , no lesions erythema or exudate. Neck: Supple without thyromegaly, masses, or lymphadenopathy.  Lungs: Clear to auscultation bilaterally.  Heart: Regular rate and rhythm, no murmurs rubs or gallops.  Abdomen: Bowel sounds are normal, obese, diffuse abdominal tenderness worse in lower abdomen (LLQ), nondistended, no hepatosplenomegaly or masses, no abdominal bruits or    hernia , no rebound  or guarding.   Rectal: No abnormalities. Secretions heme negative. Extremities: 1+ PE lower extremity edema.  Neuro: Alert and oriented x 4 , grossly normal neurologically.  Skin: Warm and dry, no rash or jaundice.   Psych: Alert and cooperative, normal mood and affect.

## 2010-10-17 NOTE — Assessment & Plan Note (Signed)
Reported black stools. Patient is on chronic iron but does have a history of GI bleeding. Current hemoglobin stable from last year at 12. Hemoccult negative on digital rectal exam although there was not much stool present. Check ifobt. Hold iron for 2 weeks to see if the stools clear. Avoid all NSAIDs. Patient was given specific names to help him decipher which are NSAIDs.

## 2010-10-17 NOTE — Assessment & Plan Note (Signed)
Chronic constipation. Add MiraLax 17 g daily. Prescription sent to his pharmacy

## 2010-10-17 NOTE — Patient Instructions (Signed)
Please hold your iron pill for two weeks to see if stool color returns to brown. Then resume your iron. Avoid Aleve, ibuprofen, Goodys or BCs, Advil. Go to the ER if you develop worsening abdominal pain, fever, weakness, lightheadedness, black tarry stools.

## 2010-10-17 NOTE — Assessment & Plan Note (Signed)
Abdominal pain, lower abdomen but also left lower quadrant. History of colitis last year, most consistent with ischemia based on biopsies. He was treated with vancomycin and Flagyl as well. No diarrhea at this point. Recommend CT the abdomen pelvis with contrast. We'll check his creatinine first.

## 2010-10-20 ENCOUNTER — Other Ambulatory Visit: Payer: Self-pay | Admitting: Gastroenterology

## 2010-10-20 ENCOUNTER — Ambulatory Visit (HOSPITAL_COMMUNITY)
Admission: RE | Admit: 2010-10-20 | Discharge: 2010-10-20 | Disposition: A | Discharge status: Home / Self Care | Payer: Medicare Other | Source: Ambulatory Visit | Attending: Gastroenterology | Admitting: Gastroenterology

## 2010-10-20 DIAGNOSIS — R1032 Left lower quadrant pain: Secondary | ICD-10-CM

## 2010-10-20 DIAGNOSIS — N329 Bladder disorder, unspecified: Secondary | ICD-10-CM | POA: Insufficient documentation

## 2010-10-20 DIAGNOSIS — N289 Disorder of kidney and ureter, unspecified: Secondary | ICD-10-CM | POA: Insufficient documentation

## 2010-10-20 DIAGNOSIS — R109 Unspecified abdominal pain: Secondary | ICD-10-CM | POA: Insufficient documentation

## 2010-10-20 DIAGNOSIS — Q619 Cystic kidney disease, unspecified: Secondary | ICD-10-CM | POA: Insufficient documentation

## 2010-10-20 NOTE — Progress Notes (Signed)
Cc to PCP 

## 2010-10-22 ENCOUNTER — Other Ambulatory Visit: Payer: Self-pay | Admitting: Gastroenterology

## 2010-10-22 NOTE — Progress Notes (Signed)
Faxed to Dr. Patsi Sears

## 2010-10-23 ENCOUNTER — Inpatient Hospital Stay (HOSPITAL_COMMUNITY)
Admission: AD | Admit: 2010-10-23 | Discharge: 2010-10-29 | DRG: 378 | Disposition: A | Discharge status: Home Health | Payer: Medicare Other | Source: Ambulatory Visit | Attending: Internal Medicine | Admitting: Internal Medicine

## 2010-10-23 ENCOUNTER — Inpatient Hospital Stay (HOSPITAL_COMMUNITY): Payer: Medicare Other

## 2010-10-23 DIAGNOSIS — Z791 Long term (current) use of non-steroidal anti-inflammatories (NSAID): Secondary | ICD-10-CM

## 2010-10-23 DIAGNOSIS — R55 Syncope and collapse: Secondary | ICD-10-CM | POA: Diagnosis not present

## 2010-10-23 DIAGNOSIS — D62 Acute posthemorrhagic anemia: Secondary | ICD-10-CM | POA: Diagnosis present

## 2010-10-23 DIAGNOSIS — I129 Hypertensive chronic kidney disease with stage 1 through stage 4 chronic kidney disease, or unspecified chronic kidney disease: Secondary | ICD-10-CM | POA: Diagnosis present

## 2010-10-23 DIAGNOSIS — K219 Gastro-esophageal reflux disease without esophagitis: Secondary | ICD-10-CM | POA: Diagnosis present

## 2010-10-23 DIAGNOSIS — K31811 Angiodysplasia of stomach and duodenum with bleeding: Principal | ICD-10-CM | POA: Diagnosis present

## 2010-10-23 DIAGNOSIS — E876 Hypokalemia: Secondary | ICD-10-CM | POA: Diagnosis not present

## 2010-10-23 DIAGNOSIS — K922 Gastrointestinal hemorrhage, unspecified: Secondary | ICD-10-CM

## 2010-10-23 DIAGNOSIS — IMO0001 Reserved for inherently not codable concepts without codable children: Secondary | ICD-10-CM | POA: Diagnosis present

## 2010-10-23 DIAGNOSIS — N289 Disorder of kidney and ureter, unspecified: Secondary | ICD-10-CM | POA: Diagnosis present

## 2010-10-23 DIAGNOSIS — I4510 Unspecified right bundle-branch block: Secondary | ICD-10-CM | POA: Diagnosis present

## 2010-10-23 DIAGNOSIS — D126 Benign neoplasm of colon, unspecified: Secondary | ICD-10-CM | POA: Diagnosis present

## 2010-10-23 DIAGNOSIS — N189 Chronic kidney disease, unspecified: Secondary | ICD-10-CM | POA: Diagnosis present

## 2010-10-23 DIAGNOSIS — D649 Anemia, unspecified: Secondary | ICD-10-CM

## 2010-10-23 DIAGNOSIS — E785 Hyperlipidemia, unspecified: Secondary | ICD-10-CM | POA: Diagnosis present

## 2010-10-23 HISTORY — PX: ESOPHAGOGASTRODUODENOSCOPY: SHX1529

## 2010-10-23 LAB — BASIC METABOLIC PANEL
BUN: 40 mg/dL — ABNORMAL HIGH (ref 6–23)
BUN: 45 mg/dL — ABNORMAL HIGH (ref 6–23)
Calcium: 9.2 mg/dL (ref 8.4–10.5)
Calcium: 8.6 mg/dL (ref 8.4–10.5)
Creatinine, Ser: 1.97 mg/dL — ABNORMAL HIGH (ref 0.4–1.5)
Creatinine, Ser: 2.19 mg/dL — ABNORMAL HIGH (ref 0.4–1.5)
GFR calc Af Amer: 40 mL/min — ABNORMAL LOW (ref >60)
GFR calc Af Amer: 35 mL/min — ABNORMAL LOW (ref >60)
GFR calc non Af Amer: 33 mL/min — ABNORMAL LOW (ref >60)
GFR calc non Af Amer: 29 mL/min — ABNORMAL LOW (ref >60)

## 2010-10-23 LAB — DIFFERENTIAL
Basophils Absolute: 0 10*3/uL (ref 0.0–0.1)
Basophils Absolute: 0 10*3/uL (ref 0.0–0.1)
Basophils Relative: 0 % (ref 0–1)
Eosinophils Absolute: 0 10*3/uL (ref 0.0–0.7)
Eosinophils Absolute: 0.1 10*3/uL (ref 0.0–0.7)
Lymphocytes Relative: 15 % (ref 12–46)
Monocytes Absolute: 1.5 10*3/uL — ABNORMAL HIGH (ref 0.1–1.0)
Monocytes Absolute: 0.9 10*3/uL (ref 0.1–1.0)
Monocytes Relative: 4 % (ref 3–12)
Neutrophils Relative %: 78 % — ABNORMAL HIGH (ref 43–77)
Neutrophils Relative %: 81 % — ABNORMAL HIGH (ref 43–77)

## 2010-10-23 LAB — CARDIAC PANEL(CRET KIN+CKTOT+MB+TROPI)
CK, MB: 2.8 ng/mL (ref 0.3–4.0)
Troponin I: 0.02 ng/mL (ref 0.00–0.06)
Troponin I: 0.02 ng/mL (ref 0.00–0.06)

## 2010-10-23 LAB — GLUCOSE, CAPILLARY
Glucose-Capillary: 351 mg/dL — ABNORMAL HIGH (ref 70–99)
Glucose-Capillary: 263 mg/dL — ABNORMAL HIGH (ref 70–99)
Glucose-Capillary: 252 mg/dL — ABNORMAL HIGH (ref 70–99)

## 2010-10-23 LAB — CBC
Hemoglobin: 10.7 g/dL — ABNORMAL LOW (ref 13.0–17.0)
MCH: 27.7 pg (ref 26.0–34.0)
MCHC: 32 g/dL (ref 30.0–36.0)
Platelets: 387 10*3/uL (ref 150–400)
Platelets: 347 10*3/uL (ref 150–400)
RBC: 3.91 MIL/uL — ABNORMAL LOW (ref 4.22–5.81)
RDW: 15.1 % (ref 11.5–15.5)

## 2010-10-23 LAB — MAGNESIUM: Magnesium: 2.3 mg/dL (ref 1.5–2.5)

## 2010-10-23 LAB — HEMOGLOBIN AND HEMATOCRIT, BLOOD
HCT: 27.6 % — ABNORMAL LOW (ref 39.0–52.0)
HCT: 25.4 % — ABNORMAL LOW (ref 39.0–52.0)
Hemoglobin: 9.3 g/dL — ABNORMAL LOW (ref 13.0–17.0)
Hemoglobin: 8.3 g/dL — ABNORMAL LOW (ref 13.0–17.0)

## 2010-10-23 LAB — PREPARE RBC (CROSSMATCH)

## 2010-10-23 LAB — D-DIMER, QUANTITATIVE: D-Dimer, Quant: 0.61 ug/mL-FEU — ABNORMAL HIGH (ref 0.00–0.48)

## 2010-10-23 LAB — GLUCOSE, RANDOM: Glucose, Bld: 389 mg/dL — ABNORMAL HIGH (ref 70–99)

## 2010-10-24 ENCOUNTER — Inpatient Hospital Stay (HOSPITAL_COMMUNITY): Payer: Medicare Other

## 2010-10-24 DIAGNOSIS — K625 Hemorrhage of anus and rectum: Secondary | ICD-10-CM

## 2010-10-24 DIAGNOSIS — K31811 Angiodysplasia of stomach and duodenum with bleeding: Secondary | ICD-10-CM

## 2010-10-24 DIAGNOSIS — K31819 Angiodysplasia of stomach and duodenum without bleeding: Secondary | ICD-10-CM

## 2010-10-24 HISTORY — DX: Angiodysplasia of stomach and duodenum without bleeding: K31.819

## 2010-10-24 LAB — COMPREHENSIVE METABOLIC PANEL
AST: 14 U/L (ref 0–37)
BUN: 42 mg/dL — ABNORMAL HIGH (ref 6–23)
CO2: 29 mEq/L (ref 19–32)
Chloride: 105 mEq/L (ref 96–112)
Creatinine, Ser: 1.94 mg/dL — ABNORMAL HIGH (ref 0.4–1.5)
GFR calc Af Amer: 41 mL/min — ABNORMAL LOW (ref >60)
GFR calc non Af Amer: 34 mL/min — ABNORMAL LOW (ref >60)
Total Bilirubin: 0.3 mg/dL (ref 0.3–1.2)

## 2010-10-24 LAB — URINALYSIS, ROUTINE W REFLEX MICROSCOPIC
Hgb urine dipstick: NEGATIVE
Nitrite: NEGATIVE
Protein, ur: NEGATIVE mg/dL
Urobilinogen, UA: 0.2 mg/dL (ref 0.0–1.0)

## 2010-10-24 LAB — GLUCOSE, CAPILLARY
Glucose-Capillary: 178 mg/dL — ABNORMAL HIGH (ref 70–99)
Glucose-Capillary: 253 mg/dL — ABNORMAL HIGH (ref 70–99)
Glucose-Capillary: 292 mg/dL — ABNORMAL HIGH (ref 70–99)
Glucose-Capillary: 422 mg/dL — ABNORMAL HIGH (ref 70–99)
Glucose-Capillary: 98 mg/dL (ref 70–99)

## 2010-10-24 LAB — CBC
MCH: 27.5 pg (ref 26.0–34.0)
MCHC: 31.6 g/dL (ref 30.0–36.0)
MCV: 86.9 fL (ref 78.0–100.0)
Platelets: 321 10*3/uL (ref 150–400)
RDW: 15.4 % (ref 11.5–15.5)

## 2010-10-24 LAB — DIFFERENTIAL
Eosinophils Absolute: 0.2 10*3/uL (ref 0.0–0.7)
Eosinophils Relative: 1 % (ref 0–5)
Lymphs Abs: 3.6 10*3/uL (ref 0.7–4.0)
Monocytes Absolute: 1 10*3/uL (ref 0.1–1.0)
Monocytes Relative: 6 % (ref 3–12)

## 2010-10-24 LAB — TSH: TSH: 0.514 u[IU]/mL (ref 0.350–4.500)

## 2010-10-24 LAB — T4, FREE: Free T4: 1.05 ng/dL (ref 0.80–1.80)

## 2010-10-24 LAB — URINE MICROSCOPIC-ADD ON

## 2010-10-24 MED ORDER — TECHNETIUM TO 99M ALBUMIN AGGREGATED
5.0000 | Freq: Once | INTRAVENOUS | Status: AC | PRN
  Administered 2010-10-24: 5.1 via INTRAVENOUS

## 2010-10-24 MED ORDER — XENON XE 133 GAS
10.0000 | GAS_FOR_INHALATION | Freq: Once | RESPIRATORY_TRACT | Status: AC | PRN
  Administered 2010-10-24: 6.2 via RESPIRATORY_TRACT

## 2010-10-25 LAB — URINE CULTURE: Special Requests: NEGATIVE

## 2010-10-25 LAB — DIFFERENTIAL
Basophils Absolute: 0 10*3/uL (ref 0.0–0.1)
Basophils Relative: 0 % (ref 0–1)
Eosinophils Absolute: 0.3 10*3/uL (ref 0.0–0.7)
Eosinophils Relative: 2 % (ref 0–5)
Monocytes Absolute: 1.3 10*3/uL — ABNORMAL HIGH (ref 0.1–1.0)

## 2010-10-25 LAB — BASIC METABOLIC PANEL
BUN: 35 mg/dL — ABNORMAL HIGH (ref 6–23)
CO2: 28 mEq/L (ref 19–32)
Calcium: 8.2 mg/dL — ABNORMAL LOW (ref 8.4–10.5)
Creatinine, Ser: 1.83 mg/dL — ABNORMAL HIGH (ref 0.4–1.5)
GFR calc Af Amer: 44 mL/min — ABNORMAL LOW (ref >60)
Glucose, Bld: 140 mg/dL — ABNORMAL HIGH (ref 70–99)

## 2010-10-25 LAB — CBC
MCHC: 32.2 g/dL (ref 30.0–36.0)
RDW: 15.6 % — ABNORMAL HIGH (ref 11.5–15.5)

## 2010-10-25 LAB — GLUCOSE, CAPILLARY
Glucose-Capillary: 120 mg/dL — ABNORMAL HIGH (ref 70–99)
Glucose-Capillary: 171 mg/dL — ABNORMAL HIGH (ref 70–99)

## 2010-10-25 LAB — HEMOGLOBIN AND HEMATOCRIT, BLOOD
HCT: 26.9 % — ABNORMAL LOW (ref 39.0–52.0)
Hemoglobin: 8.8 g/dL — ABNORMAL LOW (ref 13.0–17.0)

## 2010-10-25 NOTE — H&P (Signed)
NAME:  John [name], John [name]                ACCOUNT NO.:  [id number]  MEDICAL RECORD NO.:  [id number]           PATIENT TYPE:  I  LOCATION:  A316                          FACILITY:  [facility]  PHYSICIAN:  Tarry Kos, MD       DATE OF BIRTH:  1930-11-30  DATE OF ADMISSION:  10/23/2010 DATE OF DISCHARGE:  LH                             HISTORY & PHYSICAL   CHIEF COMPLAINT:  Bright red blood per rectum.  HISTORY OF PRESENT ILLNESS:  John [name] is a very pleasant 77 year old male with a history of diabetes and the recurrent GI bleeding issues. He has had an extensive workup being followed up by Dr. Jena Gauss for recurrent GI bleed in the past, thought that he has an AVM.  Have not been able to find the exact reason why he keeps having GI bleed. However, he woke up this morning with episode of bright red blood per rectum.  He occasionally has some generalized abdominal pain, which he has also been seen Dr. Jena Gauss for, but he denies any today.  He only had this one bowel movement and came to the emergency department because of his history.  Denies any nausea or vomiting.  Denies any dizziness.  REVIEW OF SYSTEMS:  Otherwise negative.  PAST MEDICAL HISTORY: 1. Recurrent GI bleed of unclear etiology, thought to be maybe due to     AVMs with extensive workup.  He has had pill endoscopy examination     already, which is reportedly negative.  I cannot find these records     here. 2. Suspicion of nonsteroidal anti-inflammatory drug enteropathy. 3. On his discharge, it is said that he had several nonbleeding     erosions through the small bowel per incomplete small bowel capsule     endoscopy on April 01, 2010. 4. Colon polyps. 5. Hypertension. 6. Type 2 diabetes. 7. Chronic low back pain. 8. History of stroke. 9. Chronic renal insufficiency.  ALLERGIES:  SULFA and DARVON.  SURGICAL HISTORY:  He is status post chole, status post appendectomy, status post left elbow surgery.  SOCIAL HISTORY:  He  lives by himself.  He came from home.  He denies alcohol.  No IV drug abuse or alcohol use.  MEDICATIONS: 1. Januvia 50 mg daily. 2. Protonix 40 mg daily. 3. Multivitamin a day. 4. Amlodipine 10 mg daily. 5. Ramipril 30 mg daily. 6. Lasix 20 mg daily. 7. KCl 10 mEq daily. 8. Trilipix 135 mg daily. 9. Bystolic 2.5 mg daily. 10.Lantus 45 units subcu nightly.  PHYSICAL EXAMINATION:  VITAL SIGNS:  Blood pressure 127/51, pulse 82, respirations 20, temperature 97.9, 93% O2 sats on room air. GENERAL:  He is alert and oriented.  In no apparent distress, cooperates fairly. HEART:  Regular rate and rhythm without murmurs, rubs, or gallops. CHEST:  Clear to auscultation bilaterally.  No wheeze, rhonchi, or rales. ABDOMEN:  Soft, nontender, and nondistended.  Positive bowel sounds.  No hepatosplenomegaly. EXTREMITIES:  No clubbing, cyanosis, or edema. PSYCH:  Normal mood and affect. NEURO:  No focal neurologic deficits. SKIN:  No rashes.  LABORATORY DATA:  His hemoglobin is 10.7.  His last hemoglobin at discharge in October 2011 was 8.9.  His baseline hemoglobin from his records while he was in the hospital is around 9.  BUN and creatinine are elevated at 40 and 1.97.  His baseline creatinine is around 1.3. Potassium is normal.  He did have a CT of his abdomen and pelvis on October 20, 2010, after seeing Dr. Jena Gauss for abdominal pain, which showed no acute abdominal or pelvic process, showed a small filling defect with the trigone region of the bladder, questionable whether this is related to prostate hypertrophy, and multiple small cysts within the kidneys.  ASSESSMENT AND PLAN:  This is a 77 year old male with recurrent gastrointestinal bleed of unclear etiology. 1. Recurrent gastrointestinal bleed with extensive workup by Dr.     Jena Gauss.  Dr. Jena Gauss has already been called and consulted for further     workup.  At this point, he is stable.  I am going to check q.6 h. H     and H, and we  will transfuse if his hemoglobin drops below 8.5.  He     has not had any recurrent bright red blood per rectum since that     one time episode this morning.  We will check orthostatics right     now, provide him some IV fluids at 125 mL an hour for 12 hours.     This will need to be reassessed particularly if he continues to     bleed.  I am not going to order any further tests or studies until     GI sees the patient. 2. Chronic anemia.  The patient's anemia seems to be at his baseline     right now.  Again, monitor for any further acute blood loss. 3. Diabetes, uncontrolled right now.  We will place on sliding scale     insulin and Lantus 30 units subcu now and keep him n.p.o. for now. 4. Abnormal CT of his abdomen and pelvis with small filling defect of     the trigone region of the bladder.  I am going to check UA with     micro right now and this will need to further outpatient     followup. 5. Acute renal failure and chronic renal insufficiency, probably     secondary to the above issues.  We will place him on some IV fluids     and repeat his BMP in the morning.  This is likely due to some     prerenal azotemia from     the gastrointestinal blood loss. 6. Hypertension, stable. 7. Further recommendation pending over hospital course.                                           ______________________________ Tarry Kos, MD     RD/MEDQ  D:  10/23/2010  T:  10/23/2010  Job:  [id number]  Electronically Signed by Tarry Kos MD on 10/25/2010 06:58:14 AM

## 2010-10-26 DIAGNOSIS — K922 Gastrointestinal hemorrhage, unspecified: Secondary | ICD-10-CM

## 2010-10-26 LAB — DIFFERENTIAL
Eosinophils Absolute: 0.4 10*3/uL (ref 0.0–0.7)
Lymphocytes Relative: 18 % (ref 12–46)
Lymphs Abs: 2.9 10*3/uL (ref 0.7–4.0)
Monocytes Relative: 6 % (ref 3–12)
Neutrophils Relative %: 74 % (ref 43–77)

## 2010-10-26 LAB — CBC
HCT: 24.9 % — ABNORMAL LOW (ref 39.0–52.0)
Hemoglobin: 8 g/dL — ABNORMAL LOW (ref 13.0–17.0)
MCH: 28.3 pg (ref 26.0–34.0)
MCV: 88 fL (ref 78.0–100.0)
Platelets: 288 10*3/uL (ref 150–400)
RBC: 2.83 MIL/uL — ABNORMAL LOW (ref 4.22–5.81)
WBC: 15.8 10*3/uL — ABNORMAL HIGH (ref 4.0–10.5)

## 2010-10-26 LAB — BASIC METABOLIC PANEL
BUN: 32 mg/dL — ABNORMAL HIGH (ref 6–23)
Chloride: 106 mEq/L (ref 96–112)
Potassium: 3.8 mEq/L (ref 3.5–5.1)
Sodium: 138 mEq/L (ref 135–145)

## 2010-10-26 LAB — GLUCOSE, CAPILLARY
Glucose-Capillary: 221 mg/dL — ABNORMAL HIGH (ref 70–99)
Glucose-Capillary: 149 mg/dL — ABNORMAL HIGH (ref 70–99)
Glucose-Capillary: 136 mg/dL — ABNORMAL HIGH (ref 70–99)

## 2010-10-26 LAB — HEMOGLOBIN AND HEMATOCRIT, BLOOD: HCT: 24.4 % — ABNORMAL LOW (ref 39.0–52.0)

## 2010-10-27 ENCOUNTER — Encounter (HOSPITAL_COMMUNITY): Payer: Self-pay

## 2010-10-27 ENCOUNTER — Other Ambulatory Visit: Payer: Self-pay | Admitting: Internal Medicine

## 2010-10-27 ENCOUNTER — Inpatient Hospital Stay (HOSPITAL_COMMUNITY): Payer: Medicare Other

## 2010-10-27 DIAGNOSIS — K921 Melena: Secondary | ICD-10-CM

## 2010-10-27 DIAGNOSIS — D126 Benign neoplasm of colon, unspecified: Secondary | ICD-10-CM

## 2010-10-27 DIAGNOSIS — K922 Gastrointestinal hemorrhage, unspecified: Secondary | ICD-10-CM

## 2010-10-27 HISTORY — DX: Gastrointestinal hemorrhage, unspecified: K92.2

## 2010-10-27 HISTORY — PX: COLONOSCOPY: SHX174

## 2010-10-27 HISTORY — DX: Benign neoplasm of colon, unspecified: D12.6

## 2010-10-27 LAB — GLUCOSE, CAPILLARY
Glucose-Capillary: 116 mg/dL — ABNORMAL HIGH (ref 70–99)
Glucose-Capillary: 95 mg/dL (ref 70–99)
Glucose-Capillary: 97 mg/dL (ref 70–99)
Glucose-Capillary: 88 mg/dL (ref 70–99)
Glucose-Capillary: 164 mg/dL — ABNORMAL HIGH (ref 70–99)

## 2010-10-27 LAB — CBC
HCT: 26.6 % — ABNORMAL LOW (ref 39.0–52.0)
MCHC: 33.1 g/dL (ref 30.0–36.0)
Platelets: 313 10*3/uL (ref 150–400)
RDW: 15.6 % — ABNORMAL HIGH (ref 11.5–15.5)
WBC: 17.1 10*3/uL — ABNORMAL HIGH (ref 4.0–10.5)

## 2010-10-27 LAB — DIFFERENTIAL
Basophils Absolute: 0 10*3/uL (ref 0.0–0.1)
Basophils Relative: 0 % (ref 0–1)
Eosinophils Absolute: 0.4 10*3/uL (ref 0.0–0.7)
Eosinophils Relative: 2 % (ref 0–5)
Lymphocytes Relative: 18 % (ref 12–46)
Lymphs Abs: 3.1 10*3/uL (ref 0.7–4.0)
Monocytes Absolute: 1.1 10*3/uL — ABNORMAL HIGH (ref 0.1–1.0)
Monocytes Relative: 7 % (ref 3–12)
Neutro Abs: 12.5 10*3/uL — ABNORMAL HIGH (ref 1.7–7.7)
Neutrophils Relative %: 73 % (ref 43–77)

## 2010-10-27 LAB — TYPE AND SCREEN
ABO/RH(D): A POS
Antibody Screen: NEGATIVE
Unit division: 0

## 2010-10-27 LAB — BASIC METABOLIC PANEL
Calcium: 8.4 mg/dL (ref 8.4–10.5)
GFR calc non Af Amer: 45 mL/min — ABNORMAL LOW (ref >60)
Glucose, Bld: 113 mg/dL — ABNORMAL HIGH (ref 70–99)
Potassium: 3.7 mEq/L (ref 3.5–5.1)
Sodium: 139 mEq/L (ref 135–145)

## 2010-10-27 MED ORDER — TECHNETIUM TC 99M-LABELED RED BLOOD CELLS IV KIT
25.0000 | PACK | Freq: Once | INTRAVENOUS | Status: AC | PRN
  Administered 2010-10-27: 23.3 via INTRAVENOUS

## 2010-10-28 ENCOUNTER — Other Ambulatory Visit: Payer: Self-pay | Admitting: Urgent Care

## 2010-10-28 DIAGNOSIS — K922 Gastrointestinal hemorrhage, unspecified: Secondary | ICD-10-CM

## 2010-10-28 LAB — GLUCOSE, CAPILLARY
Glucose-Capillary: 111 mg/dL — ABNORMAL HIGH (ref 70–99)
Glucose-Capillary: 216 mg/dL — ABNORMAL HIGH (ref 70–99)
Glucose-Capillary: 136 mg/dL — ABNORMAL HIGH (ref 70–99)

## 2010-10-28 LAB — CBC
MCHC: 32.9 g/dL (ref 30.0–36.0)
Platelets: 331 10*3/uL (ref 150–400)
RDW: 15.6 % — ABNORMAL HIGH (ref 11.5–15.5)
WBC: 13.8 10*3/uL — ABNORMAL HIGH (ref 4.0–10.5)

## 2010-10-28 LAB — DIFFERENTIAL
Basophils Absolute: 0 10*3/uL (ref 0.0–0.1)
Basophils Relative: 0 % (ref 0–1)
Eosinophils Relative: 2 % (ref 0–5)
Monocytes Absolute: 0.8 10*3/uL (ref 0.1–1.0)
Neutro Abs: 10.5 10*3/uL — ABNORMAL HIGH (ref 1.7–7.7)

## 2010-10-28 NOTE — Progress Notes (Signed)
Please arrange referral to [facility] faxed all records & referral made

## 2010-10-28 NOTE — Op Note (Signed)
NAME:  John [name], John [name]                ACCOUNT NO.:  [id number]  MEDICAL RECORD NO.:  [id number]           PATIENT TYPE:  I  LOCATION:  A327                          FACILITY:  [facility]  PHYSICIAN:  R. Roetta Sessions, M.D. DATE OF BIRTH:  September 05, 1930  DATE OF PROCEDURE:  10/27/2010 DATE OF DISCHARGE:                              OPERATIVE REPORT   PROCEDURE:  Ileocolonoscopy and snare polypectomy.  INDICATIONS FOR PROCEDURE:  A 79-year gentleman admitted to hospital with stuttering GI bleed.  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  Capsule study revealed fresh blood in the distal without discrete lesion being found.  The patient did have at least one erosion more proximally which appeared to be innocent on capsule study.  Capsule did make into the cecum.  Given his transfusion requirements although he remained hemodynamically stable.  He is going to extend the iliac colonoscopy at this time.  Risks, benefits, limitations, alternatives, imponderables have been discussed.  Questions are answered.  Please see the documentation medical record.  PROCEDURE NOTE:  O2 saturation, blood pressure, pulse, and respirations were monitored throughout the entire procedure.  Conscious sedation Versed 5 mg IV Demerol 125 mg IV in divided doses.  INSTRUMENT:  Pentax video chip system.  FINDINGS:  Digital rectal exam revealed no abnormalities.  Endoscopic Findings:  There was a blood-tinged colonic effluent throughout the colon.  The scope was easily advanced from the rectosigmoid junction all the way to the cecum.  Cecum, ileocecal valve, appendiceal orifice were seen and photographed for the record.  Blood-tinged colonic effluent was suctioned out throughout the length of the colon.  The terminal ileum was intubated a good 40 cm at the mouth, ileocecal valve.  There was a small amount of fresh blood, it was  washed away.  Mucosal surfaces from the ileocecal valve to approximately 40 cm are well seen.  Upstream, there was bile-stained fluid coming down without any evidence of clotted blood or any mucosal abnormality carefully inspect the distal terminal ileum repeatedly, the scope was pulled back into the colon.  Very careful examination of the colonic mucosa was undertaken.  There was no bleeding lesion seen.  There was a 4 mm polyp, hepatic flexure which was hot snared.  The right colonic mucosa appeared normal.  Scope was pulled down the rectum.  The rectal mucosa including retroflex view of the anal verge demonstrated no abnormalities.  The patient tolerated the procedure well.  Cecal withdrawal time 24 minutes.  IMPRESSION:  Normal rectum, bloody colonic effluent throughout the colon without bleeding lesion seen.  Polyp at hepatic flexure, status post hot snare polypectomy, distal 40 cm of terminal ileum surveyed, small amount of fresh blood in the most distal terminal ileum just inside the ileocecal valve, but though no bleeding lesions seen.  I suspect a stuttering obscure small bowel lesion as to be responsible for bleeding.  RECOMMENDATIONS: 1. Clear liquid diet. 2. We will proceed with a nuclear tagged RBC study to see if we can     further corroborate  the region of bleeding.  Ultimately, John [name]     may benefit from double balloon enteroscopy from the colon and a     tertiary referral center. 3. Further recommendations to follow.     John [name], M.D.     RMR/MEDQ  D:  10/27/2010  T:  10/27/2010  Job:  [id number]  cc:   John [name], M.D. Fax: [phone]  Electronically Signed by Lorrin Goodell M.D. on 10/28/2010 07:53:07 PM

## 2010-10-29 DIAGNOSIS — K922 Gastrointestinal hemorrhage, unspecified: Secondary | ICD-10-CM

## 2010-10-29 LAB — GLUCOSE, CAPILLARY
Glucose-Capillary: 156 mg/dL — ABNORMAL HIGH (ref 70–99)
Glucose-Capillary: 178 mg/dL — ABNORMAL HIGH (ref 70–99)

## 2010-10-29 LAB — BASIC METABOLIC PANEL
Calcium: 9 mg/dL (ref 8.4–10.5)
GFR calc Af Amer: 51 mL/min — ABNORMAL LOW (ref >60)
GFR calc non Af Amer: 42 mL/min — ABNORMAL LOW (ref >60)
Glucose, Bld: 171 mg/dL — ABNORMAL HIGH (ref 70–99)
Potassium: 3.4 mEq/L — ABNORMAL LOW (ref 3.5–5.1)
Sodium: 140 mEq/L (ref 135–145)

## 2010-10-29 LAB — CBC
Hemoglobin: 8.3 g/dL — ABNORMAL LOW (ref 13.0–17.0)
MCH: 28.1 pg (ref 26.0–34.0)
MCHC: 32.5 g/dL (ref 30.0–36.0)
MCV: 86.4 fL (ref 78.0–100.0)
RBC: 2.95 MIL/uL — ABNORMAL LOW (ref 4.22–5.81)
RDW: 15.3 % (ref 11.5–15.5)

## 2010-10-29 LAB — DIFFERENTIAL
Eosinophils Absolute: 0.3 10*3/uL (ref 0.0–0.7)
Lymphs Abs: 2.2 10*3/uL (ref 0.7–4.0)
Monocytes Relative: 9 % (ref 3–12)
Neutrophils Relative %: 71 % (ref 43–77)

## 2010-10-31 NOTE — Consult Note (Signed)
NAME:  John, [name] NO.:  [id number]  MEDICAL RECORD NO.:  [id number]           PATIENT TYPE:  I  LOCATION:  IC10                          FACILITY:  [facility]  PHYSICIAN:  John [name], M.D.     DATE OF BIRTH:  08/06/1930  DATE OF CONSULTATION:  10/23/2010 DATE OF DISCHARGE:                                CONSULTATION   John [name] is a 77 year old patient of John [name] who was admitted to [facility] this morning following GI bleed.  Apparently, we were asked to see following a brief syncopal spell in the endoscopy lab prior to undergoing his endoscopy.  HISTORY OF PRESENT ILLNESS:  John [name] is a 77 year old gentleman.  He underwent cardiac catheterization in 2000, which showed normal coronary arteries.  In 2009, he did not have significant ischemia noted on nuclear scan.  He has a history of obesity, type 2 diabetes mellitus, systemic hypertension, GERD, hyperlipidemia, peripheral neuropathy, as well as a very mild aortic stenosis.  He has been documented to have right bundle-branch block.  He sees Dr. Talmage [name] for diabetes management. He last saw John [name] in February 2012.  He does have a history of renal insufficiency, felt to be secondary to his longstanding diabetes mellitus.  Apparently, the patient has a remote history of GI bleeds in the past. Remotely, he also had been on Naprosyn.  Recently, he admits that his diabetes has been somewhat out of control.  His sugars have been up to 300-400 range.  This morning approximately 3 a.m., he went to the bathroom and noted bright red blood per rectum.  He ultimately presented to [facility].  He was seen by Dr. Jena [name].  Remotely, he had previously undergone capsule endoscopy and colonoscopy in the fall of 2011.  The patient was scheduled to undergo endoscopy today and apparently in the endoscopy suite, had a brief syncopal spell.  ECG shows sinus rhythm with right bundle-branch block  without arrhythmia. He now was transferred to the ICU for observation and further evaluation.  The patient denies any associated chest tightness.  He denies any tachy palpitations.  PAST MEDICAL HISTORY:  Notable for recurrent GI bleed of unclear etiology spell in the past to be due to AVMs.  In the past, he has had history of nonsteroidal antiinflammatory induced probable etiology. There is a history of colonic polyps, hypertension, type 2 diabetes mellitus.  There is also a remote history of possible stroke as well as renal insufficiency.  He is allergic to SULFA and DARVON.  PAST SURGICAL HISTORY:  Notable for that he is status post cholecystectomy, appendectomy, and left elbow surgery.  SOCIAL HISTORY:  He is widowed.  He lives by myself.  He has 2 sons who live out of town.  There is no IV drug use or alcohol use.  There is no tobacco use.  MEDICATIONS ON ADMISSION: 1. Januvia 50 mg. 2. Protonix 40 mg. 3. Multivitamins. 4. Amlodipine 10 mg. 5. Ramipril at a high dose of 30 mg. 6. Lasix 20 mg. 7. KCl 10 mEq. 8. Trilipix 135 mg. 9. Bystolic 2.5 mg  in addition to Lantus insulin.  REVIEW OF SYSTEMS:  Notable for obesity.  He denies any fevers, chills, night sweats.  He denies any rash.  He does have multiple seborrheic keratosis and he does at times have some difficulties with sleep. Remotely, he had undergone a sleep study but unfortunately this may be associated with technical difficulty, so the study was never completed and he had not had further workup.  He denies wheezing.  He does admit to recent increased glucose up to 300-400 range.  He denies chest pain. He denies angina.  He denies abdominal pain.  He denies urinary symptoms.  He did have bright red blood per rectum today following the bowel movement.  He at times noted some leg swelling.  PHYSICAL EXAMINATION:  VITAL SIGNS:  Notable for afebrile, temperature 97.4, pulse is regular at 70, respirations were 16,  blood pressure earlier today was 162/82, O2 saturation 97. GENERAL:  Alopecia. HEENT:  Pupils are reactive to light and accommodation.  Examination of his oropharynx reveals at least grade 3 Mallampati scale. NECK:  He had thick neck.  I did not hear any carotid bruits. HEART:  Rhythm was regular with increased splitting of a second heart sound.  A 2/6 systolic murmur, left sternal border.  There was no aortic insufficiency.  He had significant central adiposity.  He did not have significant edema.  He did not have pathologic reflexes or tremor.  EKG shows normal sinus rhythm with right bundle-branch block and associated repolarization changes, PR interval at 134 milliseconds, QRS ratio 152 milliseconds, QTc interval of 470 milliseconds.  LABORATORY:  Notable for CK negative.  Sodium 138, potassium 4.3, chloride 104, CO2 26, BUN 45, creatinine 2.19, glucose 241.  Hemoglobin 10.7, hematocrit 32.8, repeat was 9.5 and 29.7.  Abdominal x-ray did not reveal any obstructive bowel gas pattern.  Chest x-ray did not show any acute cardiac or pulmonary process.  IMPRESSION:  John [name] is a 77 year old gentleman with previously documented normal coronary arteries by cardiac catheterization 12 years ago.  He has a history of chronic right bundle-branch block, diabetes mellitus.  He does have a history of remote CVA, remote prostate surgery.  He now presents following several days of poorly controlled diabetes with blood sugars at 400 and 300.  Yesterday, he did not eat well.  This morning, he did have acute bright red blood per rectum associated with a drop in hemoglobin and hematocrit.  He does have renal insufficiency.  I suspect his syncope in the endoscopy lab may be vasovagal and potentially orthostatic mediated.  The patient may have had significant increased urination following his hyperglycemia and coupled with poor appetite yesterday, GI blood loss most likely had an event today.   Agree with repeat 2D echo Doppler study in this patient with documented very mild AS.  Carotid duplex imaging will also be performed.  The patient is on telemetry to monitor any potential cardiac dysrhythmia.  I would check orthostatic blood pressures.  Consider volume repletion.  Followup cardiac markers to make certain there is no potential for ischemia.  I have included office records in the chart.  We will follow the patient.          ______________________________ John [name], M.D.     TK/MEDQ  D:  10/23/2010  T:  10/24/2010  Job:  [id number]  cc:   Gerlene Burdock A. John [name], M.D. Fax: [phone]  Dr. Sherrie Mustache  Electronically Signed by John [name] M.D. on 10/31/2010 13:24:40 PM

## 2010-11-03 NOTE — Discharge Summary (Signed)
NAME:  John [name], John [name]                ACCOUNT NO.:  [id number]  MEDICAL RECORD NO.:  [id number]           PATIENT TYPE:  I  LOCATION:  A327                          FACILITY:  [facility]  PHYSICIAN:  Karlea Mckibbin L. Lendell Caprice, MDDATE OF BIRTH:  December 23, 1930  DATE OF ADMISSION:  10/23/2010 DATE OF DISCHARGE:  LH                              DISCHARGE SUMMARY   DISCHARGE DIAGNOSES: 1. Gastrointestinal bleed secondary to gastric arteriovenous     malformations, status post clipping. 2. Hepatic flexure polyp. 3. Acute blood loss anemia. 4. Syncopal episode most likely secondary to vasovagal in the setting     of volume depletion. 5. Acute renal insufficiency. 6. Chronic kidney disease. 7. Type 2 diabetes. 8. Leukocytosis without sign of infection. 9. Chronic kidney disease. 10.Malignant hypertension. 11.Nonsteroidal anti-inflammatory use. 12.Chronic anemia. 13.Gastroesophageal reflux disease. 14.Hyperlipidemia. 15.History of peripheral neuropathy. 16.Aortic sclerosis without signs of stenosis. 17.Right bundle-branch block.  DISCHARGE MEDICATIONS:  Stop all nonsteroidal anti-inflammatory use. 1. Tylenol 500 mg p.o. daily as needed for pain or fever. 2. Bystolic has been increased to 10 mg a day. 3. Amlodipine has been increased to 10 mg a day. 4. Continue Januvia 50 mg a day. 5. Benadryl nightly as needed. 6. Trilipix 135 mg a day. 7. Lantus insulin 45 units subcutaneously nightly. 8. Multivitamin a day. 9. Protonix 40 mg a day. 10.I believe he is on ramipril 10 mg b.i.d. which he should continue,     although this was not written on his home medicine reconciliation     report though documented several times in outpatient records.  Follow up with Dr. Assunta Found in 2 weeks to monitor blood pressure and renal function.  Follow up with Dr. Jena Gauss per his office to monitor any continuing signs of ongoing bleeding.  If he continues to have bleeding, the patient will be referred to [facility]  for double balloon enteroscopy.  CONDITION:  Stable.  ACTIVITY:  Ad lib.  DIET:  Should be diabetic heart-healthy.  CONSULTATIONS:  Dr. Darrick Penna, Dr. Jena Gauss, and Dr. Nicki Guadalajara.  PROCEDURES:  EGD with bipolar probe and hemoclip placement to gastric AV malformations.  Small bowel capsule endoscopy showed fresh blood in the terminal ileum and cecum.  No origin seen, but there was a nonbleeding erosion in the proximal small bowel.  Ileocolonoscopy and snare polypectomy showed bloody colonic effluent throughout the colon without bleeding lesions seen, polyp at the hepatic flexure, status post hot snare polypectomy.  LABORATORY DATA:  White blood cell count on admission 21,000 with 78% neutrophils, white count at discharge was 13,000, hemoglobin on admission 10.7 and dropped to a low of 7.9.  The patient was transfused a unit of packed red blood cells.  On Oct 28, 2010, his hemoglobin is 8.3, and he has another hemoglobin ordered for tomorrow.  Normal platelet count.  Hematocrit on admission 32.8.  D-dimer 0.61.  PT/PTT normal.  Basic metabolic panel on admission significant for glucose of 301.  BUN 40, creatinine 1.97.  Creatinine on October 27, 2010, is 1.52. Liver function tests significant for an albumin of 2.6, otherwise unremarkable.  Hemoglobin A1c is 9.8.  Serial cardiac enzymes negative. TSH 0.514, free T4 1.05.  Urinalysis showed greater than 1000 glucose, otherwise negative.  Urine culture negative.  DIAGNOSTICS:  Chest x-ray showed nothing acute.  Portable abdominal film showed nonobstructive bowel gas pattern.  Carotid ultrasound showed less than 50% diameter stenosis, bilateral carotid plaques.  V/Q scan normal. Bleeding scan on October 27, 2010, was negative for ongoing bleeding. Echocardiogram showed normal ejection fraction 55-60%, moderate LVH, grade 1 diastolic dysfunction.  Mild mitral valve regurgitation, aortic valve sclerosis without stenosis.  EKG showed normal sinus  rhythm, right bundle-branch block.  HISTORY AND HOSPITAL COURSE:  Please see H and P for details.  Mr. [name] is a 77 year old white male with multiple medical problems including previous GI bleed.  He had hematochezia.  He has a history of previous endoscopy showing AV malformations and suspicion of nonsteroidal anti-inflammatory enteropathy.  The patient had blood pressure 127/51 on admission and otherwise normal vital signs.  He had a soft abdomen, clear lung sounds.  Heart was regular.  He was admitted to the Hospitalist Service and GI was consulted.  Initially, he gave no reports of any ongoing nonsteroidal anti-inflammatory use.  His hemoglobin was higher than it had been previously.  He was started on proton pump inhibitor.  Hemoglobins were monitored.  He was taken to endoscopy where he had a syncopal episode.  Cardiology was consulted, and the patient most likely had a vasovagal episode in the setting of GI bleeding and intravascular volume depletion.  His syncope workup was fairly unremarkable.  He was given IV fluids.  His antihypertensives initially were held.  He had upper and lower endoscopy.  Upper endoscopy showed AV malformations with stigmata of recent bleeding.  These were clipped.  He continued to have occasional bloody stools and the capsule endoscopy showed some fresh blood.  Therefore, colonoscopy was done and there was bloody affluent seen.  A bleeding scan, however, was negative. The daughter reported to Dr. Jena Gauss that the patient actually has been taking a lot of Aleve for his knee pain.  The patient does admit to taking some, but denies excessive use.  His bleeding has seemed to have stopped.  If he has a stable hemoglobin tomorrow.  He can be discharged home.  Should he have any further bleeding despite being off nonsteroidal anti-inflammatories, he will need referral to [facility] for double balloon enteroscopy.  His blood pressure became elevated, and he was  started back on Bystolic and Norvasc at his usual home dose.  These were increased due to increasing blood pressure measurements.  He gave no history of having been on ramipril, but in looking at outpatient records, I believe he is on ramipril 10 mg p.o. b.i.d.  I will start this back today.  He has a CBC and a basic metabolic panel pending for tomorrow, and if he is medically stable, he can be discharged.  Initially, his diabetes was uncontrolled, but once his medications were started back, they are currently under good control.  Any changes with respect to the plan, laboratories, or status will be dictated by the discharging physician as needed.     Quinne Pires L. Lendell Caprice, MD     CLS/MEDQ  D:  10/28/2010  T:  10/29/2010  Job:  [id number]  cc:   Corrie Mckusick, M.D. Fax: [phone]  Electronically Signed by Crista Curb MD on 11/03/2010 08:06:39 AM

## 2010-11-11 NOTE — Consult Note (Signed)
NAME:  John [name], John [name]                ACCOUNT NO.:  [id number]   MEDICAL RECORD NO.:  [id number]          PATIENT TYPE:  INP   LOCATION:  A315                          FACILITY:  [facility]   PHYSICIAN:  Kofi A. Gerilyn Pilgrim, M.D. DATE OF BIRTH:  [birth year]   DATE OF CONSULTATION:  DATE OF DISCHARGE:                                 CONSULTATION   REASON FOR CONSULTATION:  Right hemiparesis.   This is a 77 year old white male who presents with a 1 week history of  right hemiparesis.  The patient did not seek medical attention  immediately, but went to see primary care, who immediately sent him over  for full workup.  The patient does not report any dysarthria or  dysphagia.  No headaches are reported.  He does report having some  stumbling/disequilibrium.  Visual symptoms are denied that are acute.  He does have baseline difficulty seeing which has not changed with a new  illness.  He apparently has a baseline history of macular degeneration  being treated at [facility].   PAST MEDICAL HISTORY:  Hypertension, dyslipidemia, diabetes mellitus,  gastroesophageal reflux disease, hypercholesterolemia, peripheral edema,  apparently history of previous CVA.   SOCIAL HISTORY:  No tobacco use.  No alcohol use.  No illicit drug use.   ALLERGIES:  BETADINE, DARVON, SULPHUR.   PAST SURGICAL HISTORY:  Cholecystectomy, kidney stone removal, prostate  surgery.   REVIEW OF SYSTEMS:  Unchanged from Dr. Vara Guardian done earlier today.   ADMISSION MEDICATIONS:  Labetalol, amlodipine, aspirin, Actos, Protonix,  ramipril, Lexapro.   PHYSICAL EXAMINATION:  Shows an obese man, no acute distress.  Temperature 97.2.  Pulse 64.  Respiration 20.  Blood pressure 145/79.  HEENT:  Showed a short, stocky neck.  He has significant high tongue  base and crowded posterior base.  The patient undoubtedly has risk for  apnea.  ABDOMEN:  Obese, but soft.  EXTREMITIES:  No edema.  MENTATION:  The patient is  awake and alert.  He converses well.  I see  no evidence of dysarthria.  He is lucid and coherent.  Language is  unremarkable.  Cranial nerve evaluation.  There is significant right exotropia.  Extraocular movements are full.  No nystagmus noted.  Face muscle  strength is symmetric.  Visual fields are intact, although he tends to  use of his right eye and discard the images from the left.  Tongue is  midline.  Uvula midline.  Motor examination shows a right upper  extremity pronator drift.  Direct strength testing actually is pretty  good on the right side.  Left side shows normal tone, muscle strength.  Reflexes are symmetric and normal.  Sensation normal to temperature,  light touch.   MRI of the brain shows subacute infarct involving the left pontine  region.  There is an old corona radiata infarct.  There is atrophy and  paraventricular white matter ischemic change that is chronic.   ASSESSMENT:  1. Acute pontine infarct, likely due to small vessel disease, given      his risk factor.  We should rule out  for a large list of problems,      particularly vertebrobasilar insufficiency which has potential to      cause severe sequelae.  2. Likely significant obstructive sleep apnea syndrome.  3. Obesity.  4. Diabetes.  5. Hypertension.   RECOMMENDATIONS:  1. MRA of the brain.  2. The patient has carotid and echo, which I believe are appropriate.  3. Continue with antiplatelet agents.  4. Sleep study which can be arranged on discharge.   Thanks for this consultation.      Kofi A. Gerilyn Pilgrim, M.D.  Electronically Signed     KAD/MEDQ  D:  08/09/2007  T:  08/11/2007  Job:  [id number]

## 2010-11-11 NOTE — H&P (Signed)
NAME:  John [name], John [name] NO.:  [id number]   MEDICAL RECORD NO.:  [id number]          PATIENT TYPE:  EMS   LOCATION:  ED                            FACILITY:  [facility]   PHYSICIAN:  Osvaldo Shipper, MD     DATE OF BIRTH:  09-14-1930   DATE OF ADMISSION:  02/14/2009  DATE OF DISCHARGE:  LH                              HISTORY & PHYSICAL   PRIMARY CARE PHYSICIAN:  The patient's PMD is Dr. Nobie Putnam.   ADMISSION DIAGNOSES:  1. Lower extremity weakness and frequent falls for the past 1-2      months.  2. History of diabetes type 2.  3. Hypertension.  4. Obesity.  5. Acid reflux disease.  6. History of stroke.   CHIEF COMPLAINT:  Fall.   HISTORY OF PRESENT ILLNESS:  The patient is a 77 year old Caucasian male  who was in his usual state of health till about a couple months ago when  he started noticing frequent falls.  These falls were not usually  associated with any loss of consciousness.  The patient feels that his  lower extremities were giving out on him.  Occasionally has episodes  where he bends down and when he straightens he feels lightheaded.  Today  he got up and to let his dog out and then when he went to the floor he  felt like he had to go urinate however, he could not hold it and had an  episode of incontinence.  And then when he was walking back he felt like  his leg gave out, fell.  He could not get himself up and called [facility]  they apparently sent EMS.  Denies any loss of consciousness.  He feels  short of breath occasionally, has had a dry cough for awhile.  Denies  any fever, no sick contacts.  He admits to acid reflux disease.  Denies  any stool incontinence.  Does have back pain, has had history of back  surgery for disk prolapse many years ago.  Denies any neck pain.  Denies  any upper extremity weakness.  Denies any muscle pain.  He also has some  degree of constant headaches.  Denies any other focal weakness.   MEDICATIONS AT HOME:  1.  Oxybutynin ER 15 mg once daily.  2. Lorazepam 1 mg three times a day.  3. Ramipril 10 mg twice daily.  4. Januvia 100 mg once daily.  5. Aspirin 81 mg once daily.  6. Trilipix 135 mg once daily.  7. Amlodipine 5 mg once daily.  8. Bystolic 5 mg once daily.  9. Cymbalta 60 mg once daily.  10.Acetaminophen as-needed.  11.Stool softener every day.   ALLERGIES:  Include BETADINE, SULFA and DARVON.   PAST MEDICAL HISTORY:  Most recently he underwent TURP for BPH.  He also  had surgery for excision and vaporization of groin warts.  Last he was  admitted in 2009 for stroke, he was at that time found to have subacute  left paramedian pontine infarct.  Remote infarct in the right coronary  artery was also noted, moderate  atrophy and white matter disease was  noted.  The TURP procedure was in June of 2009.   He also has history of type 2 diabetes, dyslipidemia, hypertension,  kidney stones, acid reflux disease and never had any heart disease that  he knows of.   SOCIAL HISTORY:  Lives alone in [place].  No smoking use.  No alcohol  use.  No illicit drug usage.  Lately he has been using a cane to  ambulate, has not required a walker yet.   FAMILY HISTORY:  Noncontributory.   REVIEW OF SYSTEMS:  GENERAL:  System positive for weakness, malaise.  HEENT: Unremarkable.  CARDIOVASCULAR:  No chest pain at this time.  RESPIRATORY:  Positive for shortness of breath.  GI: Unremarkable.  GU:  No burning sensation with urination.  No discharge is noted.  MUSCULOSKELETAL:  Unremarkable except for back pain.  NEUROLOGICAL:  As  in HPI.  Other systems unremarkable.   PHYSICAL EXAMINATION:  VITAL SIGNS:  Temperature 98.4, blood pressure  172/85, heart rate 76, respiratory rate 19, saturation 99% on 2 liters  by nasal cannula.  GENERAL:  Exam is an obese elderly white male in no distress, appears to  be slightly tachypneic.  HEENT: The head is normocephalic, atraumatic.  Oral mucous membranes   moist.  No pallor, no icterus.  Pupils are equal reacting.  NECK:  Soft and supple.  No thyromegaly.  No lymphadenopathy is noted.  LUNGS:  Mostly clear to auscultation.  No wheezing, rales or rhonchi.  No dullness to percussion.  CARDIOVASCULAR:  S1-S2 is normal, regular systolic murmur appreciated  over the aortic area.  No S3-S4.  No rubs.  No bruits.  He does have 1+  pitting edema bilateral lower extremities.  ABDOMEN:  Obese, nontender, nondistended.  Bowel sounds are present.  No  masses or organomegaly is appreciated.  GU: Examination deferred.  NEUROLOGY:  He is alert, oriented x3.  Cranial Nerves: Intact.  Sensory  exam intact.  Motor strength equal bilaterally.  Reflexes are not  hyperreflexic.  He does have some degree of lower extremity weakness.  Straight leg raising test was equivocal.  Gait was not assessed at this  time.   LABORATORY DATA:  CBC is unremarkable.  BMET showed a bicarb of 35,  glucose of 162.  Cardiac enzymes negative x2.  Total CK 125.  UA does  not show any evidence for infection.   IMAGING STUDIES:  He had a chest x-ray which showed chronic changes of  chest without acute cardiopulmonary process.  Abdominal x-ray was done  which showed no acute abnormalities.  CT of the head showed no acute  intracranial process.  Chronic small vessel disease was noted with white  mater infarct and atrophy.   Interestingly, he had an MRI of the abdomen on July 30 which showed a 4  cm complex cyst within the adjacent perinephric stranding in the lower  pole of the right kidney.  Clinical correlation was recommended at that  time.   EKG was done which shows sinus rhythm with normal axis, does have  evidence for right bundle branch block and some other intraventricular  conduction delay.  No older EKGs available to compare.  No concerning ST  changes, nonspecific T-wave changes are noted.  He had EKG earlier prior  to this one which shows similar findings.    ASSESSMENT:  This is a 77 year old Caucasian male who presents with  fall, lower extremity weakness.  Etiology is not very clear.  There was  no loss of consciousness.  I have a feeling because of the way he  describes the fall with giving out of his legs, he may have some lumbar  spine disease which could be causing these falls.  Do not suspect any  intracranial process that could be causing these falls at this time.  He  has a cough and he feels short of breath.  X-rays clear.  The cough is  dry.  However, could be because of ramipril which can cause dry cough.  He does have some lower extremity edema.  His last known ejection  fraction from 2009 was normal.  Pulmonary embolism is a possibility for  his dyspnea.  1. Lower extremity weakness with urinary incontinence.  We will go      ahead and get MRI of his lumbar spine along with the sacral spine      to rule out stenosis and other radiculopathy that could have caused      lower extremity weakness and fall.  B12 folate levels will be      checked.  2. Cough and shortness of breath.  X-rays clear.  He is requiring O2      and he is a little bit tachypneic.  He is obese.  However, I want      to make sure there is no thromboembolic event, so we will check D-      dimer, check ABG and proceed from there.  3. Diabetes.  Continue with Januvia, check CBGs q.a.c. and h.s. and      put on sliding scale.  4. Lower extremity edema.  Check a 2-D echocardiogram.  It is pitting      edema.  Low suspicion for deep venous thrombosis at this time.  No      erythema is noted.  We can try putting him on a diuretic, give him      Lasix intravenously daily which can be transitioned to oral Lasix      as tolerated.  5. Obesity.  Weight loss counseling.  6. Hypertension.  Continue with amlodipine.  7. Deep venous thrombosis prophylaxis will be initiated.  8. PT/OT consult will be obtained.  Orthostatics will be checked.   Further management decisions  will depend on results of further testing  and patient's response to treatment.      Osvaldo Shipper, MD  Electronically Signed     GK/MEDQ  D:  02/15/2009  T:  02/15/2009  Job:  [id number]   cc:   Dr. Nobie Putnam

## 2010-11-11 NOTE — Group Therapy Note (Signed)
NAME:  John [name], John [name]                ACCOUNT NO.:  [id number]   MEDICAL RECORD NO.:  [id number]          PATIENT TYPE:  INP   LOCATION:  A214                          FACILITY:  [facility]   PHYSICIAN:  Melissa L. Ladona Ridgel, MD  DATE OF BIRTH:  [birth year]   DATE OF PROCEDURE:  02/16/2009  DATE OF DISCHARGE:  02/19/2009                                 PROGRESS NOTE   Please note the accompanying written note.   SUBJECTIVE:  The patient is very anxious and talkative.  He is upset  because he blood sugars are high.  He is suspicious that we are feeding  him the wrong diet.  Despite explaining to him that he is on steroids to  help his back pain, he does not quite grasp the concept that he is  having hyperglycemia related to that medication.  He has limited  retention over the regard to his short-term memory and has very poor  vision which is contributing to some of his paranoia   OBJECTIVE:  VITAL SIGNS:  Temperature was 96.9, blood pressure 140/75,  pulse 83, respirations 18, saturation 92-97%.  GENERAL:  This is a morbidly obese African American male who is sitting  on the edge of the bed in mild distress secondary to his suspicions and  mild shortness of breath.  The patient is not describing any pain at  this time but continues to be a weak.  The patient was started on  prednisone. I will decrease the dose since he is not having symptoms.   IMPRESSION AND PLAN:  1. Leg weakness. Decrease the prednisone to 40 mg.  He is scheduled      for an MRI on Monday as he does not fit in our MRI here at [facility].  2. Diabetes with uncontrolled blood sugars.  Will increase his sliding      scale to resist it and add meal coverage. He may need Glucomander      coverage if he is unable to be brought under control this evening.      He is currently on Januvia. We will continue to monitor more      closely.  3. Hypertension.  Continue his Norvasc, Bystolic, and Altace.  4. Depression with anxiety.   The patient will be continued on Cymbalta      and Ativan.  5. Hyperlipidemia.  Will continue fenofibrate.  6. Constipation.  The patient can have Senna and Doculax.   Total time with this patient is 40 minutes.      Melissa L. Ladona Ridgel, MD  Electronically Signed     MLT/MEDQ  D:  02/25/2009  T:  02/25/2009  Job:  [id number]

## 2010-11-11 NOTE — Discharge Summary (Signed)
NAME:  John [name], John [name]                ACCOUNT NO.:  [id number]   MEDICAL RECORD NO.:  [id number]          PATIENT TYPE:  INP   LOCATION:  A214                          FACILITY:  [facility]   PHYSICIAN:  Thad Ranger, MD       DATE OF BIRTH:  01-15-1931   DATE OF ADMISSION:  02/14/2009  DATE OF DISCHARGE:  08/24/2010LH                               DISCHARGE SUMMARY   DISCHARGE DIAGNOSES:  1. Lower extremity weakness likely lumbosacral due to lumbosacral disk      disease and deconditioning.  2. Chronic obstructive pulmonary disease.  3. Diabetes type 2.  4. Hypertension.  5. Abnormal thyroid function tests.   HISTORY OF PRESENT ILLNESS:  At the time of admission, John [name] is a  77 year old male who was in his usual state of health until a couple of  months ago when he started noticing frequent falls.  These falls were  not usually associated with any loss of consciousness.  He felt that his  lower extremities were giving out on him.  Occasionally, he had episodes  when he went down, and when he straightened himself, he felt  lightheaded.  On the day of admission, he got up to let his dog out, and  when he went to the floor, he had an episode of unconsciousness.  Again,  he felt like his legs were giving out, and he fell.  He could not get  himself up and called the [facility], and he was brought to [facility].  The patient did have back pain and had a history of back surgeries  for disk prolapse  many years ago.  Otherwise, he denied any neck pain  or any upper extremity weakness, muscle pain or any focal weakness.   For medications at home, allergies, and social history, please refer to  the dictated H and P.   PAST MEDICAL HISTORY:  1. TURP for BPH.  2. Surgery for excision and vaporization  of groin warts.  3. History of CVA in 2009.  4. History of coronary artery disease.  5. Type 2 diabetes.  6. Dyslipidemia.  7. Hypertension.  8. History of GERD.   PHYSICAL EXAM AT  THE TIME OF ADMISSION:  Temperature 98.4, blood  pressure 172/85, heart rate 96, respiratory rate 19, O2 sat 99% on 2  liters.  GENERAL:  The patient is an obese elderly white male in no acute  distress.  He appeared to be slightly tachypneic.  HEENT:  Normocephalic, atraumatic.  Oral mucous membranes moist.  No  pallor or icterus.  Pupils equal and reactive.  NECK:  Soft and supple, no thyromegaly, no lymphadenopathy.  LUNGS:  Mostly clear, no wheezing, rales or rhonchi.  No dullness to  percussion.  CARDIOVASCULAR:  S1, S2 normal, regular systolic murmur appreciated over  the aortic area.  No S3, no S4, no rubs or bruits.  LOWER EXTREMITIES:  1+ pitting edema bilaterally.  ABDOMEN:  Obese, nontender, normal sounds.  GU:  Deferred.  NEURO:  Alert and awake and oriented x3.  Cranial nerves intact.  Sensory  exam intact.  Motor strength equal bilaterally.  Reflexes not  hyperreflexic, some degree of lower extremity weakness.  Straight leg  raise test was equivocal.  Gait not assessed.   DIAGNOSTIC DATA:  CBC unremarkable.  B-met showed a bicarb of 35,  glucose 152.  Cardiac enzymes were negative.  CK 135.  UA did not show  any infection.  T3, T4 was noted to be 0.87, TSH depressed at 0.17.  ESR  8.  D-dimer 0.48.  Cholesterol 169, triglycerides 290, HDL 36, LDL 75.   MRI of the lumbar spine was done which showed:  1. Postoperative changes at L3-L4 with good decompression of the      central canal with moderate to severe bilateral foraminal narrowing      due to residual disk bulging in the facet.  Arthropathy is noted.  2. Moderate central canal stenosis at L4-L5 with moderately severe      bilateral foraminal narrowing.  The patient is status post partial      laminectomy with level 3  bilateral renal lesions fully evaluated      on the patient's CT abdomen and pelvis.   BRIEF HOSPITALIZATION COURSE:  John [name] is a 77 year old male who was  admitted on February 15, 2009, for lower  extremity weakness and an episode  of URINARY INCONTINENCE.  1. Lower extremity weakness.  The patient had a history of back      surgery and laminectomy in the past and with a history of lower      extremity weakness and urinary incontinence, It was concerning that      the patient could have disc herniation or nerve root encroachment      or compression, hence MRI of the lumbosacral spine was done with      the results as mentioned above.  The patient was started on high-      dose steroids which have been gradually decreasing to wean off.      MRI did not show a severe root encroachment or disk herniation.      The patient however underwent physical therapy and qualified for      aggressive physical therapy at a skilled nursing facility.      Currently, the patient is on prednisone 20 mg daily.  He should be      continued for another day tomorrow and then decreased to 10 mg      daily for the next 3 days to off.  The patient's blood sugar has      been uncontrolled due to steroids.  He has been on sliding-scale      insulin, Lantus and Januvia.  The patient is going to need some      aggressive physical therapy.  2. Dyspnea on exertion/chronic obstructive pulmonary disease.  At the      time of admission, the patient was noted to be having controlled      shortness of breath.  On exertion, he was started on oxygen via      nasal cannula in order to keep saturation above 92%.  He was also      started on IV Lasix.  Echocardiogram was obtained to rule out acute      congestive heart failure.  Echocardiogram showed EF of 55 to 60%      with a normal wall motion.  Most likely, the worsening dyspnea is      due to COPD.  The patient was continued on DuoNebs  and oxygen      supplementation.  I will also add Advair at this time.  Lasix was      discontinued.  3. Hypertension.  It was noted that the blood pressure was not well      controlled.  Hence Bystolic was discontinued from the  patient's      outpatient medication profile, and he was started on metoprolol.      Norvasc was also increased to 10 mg daily.  4. Diabetes type 2.  The patient has a somewhat uncontrolled blood      sugar at this time that is due to exaggerated response to      steroids.  He was started on sliding-scale insulin and Lantus while      inpatient.  He will be continued on Januvia and Metformin and      sliding-scale insulin as an inpatient.  Hopefully, the blood sugar      will improve with discontinuation and weaning off steroids.  5. Abnormal thyroid function test.  It was noted that the patient had      a DEPRESSED TSH of 0.17.  This should be followed up in 4 to 6      weeks by his cardiac care physician, Dr. Nobie Putnam.   DISCHARGE MEDICATIONS:  1. Oxybutynin  ER 15 mg daily.  2. Ramipril 10 mg b.i.d.  3. Januvia 100 mg daily.  4. Aspirin 81 mg daily.  5. Trilipix  135 mg daily.  6. Metoprolol 50 mg p.o. b.i.d.  7. Amlodipine 10 mg daily.  8. Prednisone 20 mg p.o. daily for 1 day tomorrow, then 10 mg p.o.      daily for the next 3 days, then off.  9. O2 via nasal cannula to keep saturation more than 92%.  10.Advair Diskus 250/50 one puff inhaled b.i.d.   DISCHARGE INSTRUCTIONS:  1. If patient continues to have lower extremity weakness with back      pain and urinary incontinence episodes, he should follow up with      his orthopedic spine surgeon.  2. The TFTs need to be followed up in 4 to 6 weeks.   Discharge follow  up with Dr. Nobie Putnam in 2 weeks.   DISCHARGE TIME:  50 minutes.      Thad Ranger, MD  Electronically Signed     RR/MEDQ  D:  02/19/2009  T:  02/19/2009  Job:  [id number]   cc:   Dr. Nobie Putnam

## 2010-11-11 NOTE — Discharge Summary (Signed)
NAMEELIGHA, KMETZ NO.:  [id number]   MEDICAL RECORD NO.:  [id number]          PATIENT TYPE:  INP   LOCATION:  A315                          FACILITY:  [facility]   PHYSICIAN:  Gardiner Barefoot, MD    DATE OF BIRTH:  Apr 16, 1931   DATE OF ADMISSION:  08/08/2007  DATE OF DISCHARGE:  02/12/2009LH                               DISCHARGE SUMMARY   ADMITTING DIAGNOSIS:  Cerebrovascular accident.   DISCHARGE DIAGNOSES:  1. Cerebrovascular accident.  2. Diabetes.  3. Hypokalemia.   MEDICATIONS AT DISCHARGE:  1. Labetalol 200 mg b.i.d.  2. Amlodipine 5 mg daily.  3. Aspirin 81 mg daily.  4. Actos 30 mg daily.  5. Protonix 40 mg daily.  6. Ramipril 10 mg daily.  7. Lexapro 20 mg daily.  8. Glyparamide 2 mg daily.   HISTORY OF PRESENT ILLNESS:  Please see dictated history and physical on  August 09, 2007, from Dr. Lilian Kapur.  Briefly, this is a 77 year old  male with history of hypertension who came in with right-sided weakness  and right footdrop that had occurred 1 week prior to his presentation in  the emergency room.  He also had some right upper arm numbness.  The  patient initially was seen by his primary physician who sent him in to  be evaluated for stroke.   HOSPITAL COURSE BY PROBLEMS:  1. CVA.  Patient was seen in consultation by Dr. Gerilyn Pilgrim of neurology      and workup was significant for a subacute left paramedian pontine      infarct, as well as remote infarct to the right corona radiata,      some moderate atrophy and white matter disease, and old lacunar      infarct of left thalamus.  Other workup included an MRA of the head      which was not significant, as well as carotid ultrasound which also      was not significant.  The patient was continued on his medications      for blood pressure, as well as the 81 mg of aspirin.  He also had      an echocardiogram which was done prior to discharge which is      pending at this time.  The patient is to  follow up with the      neurologist in 2 weeks' time for further management and is to      continue with his blood pressure medications, as well as his      aspirin.  2. Hypertension.  Patient did have some elevation of his blood      pressure during his hospitalization and may need increase of his      medication after discharge.  3. Diabetes.  Patient will continue on p.o. medications and had no      significant events.  4. GERD.  The patient was continued on his PPI.   DISPOSITION:  The patient will visit neurologist in 2 weeks' time and  see his primary physician in 1 week.      Belia Heman.  Comer, MD  Electronically Signed     RWC/MEDQ  D:  08/11/2007  T:  08/12/2007  Job:  [id number]

## 2010-11-11 NOTE — Op Note (Signed)
NAME:  John [name], John [name]                ACCOUNT NO.:  [id number]   MEDICAL RECORD NO.:  [id number]          PATIENT TYPE:  AMB   LOCATION:  NESC                         FACILITY:  [facility]   PHYSICIAN:  Sigmund I. Patsi Sears, M.D.DATE OF BIRTH:  August 28, 1930   DATE OF PROCEDURE:  12/08/2007  DATE OF DISCHARGE:                               OPERATIVE REPORT   PREOPERATIVE DIAGNOSES:  Benign prostatic hypertrophy with chronic  urinary retention and multiple perineal and inguinal warts.   POSTOPERATIVE DIAGNOSES:  Benign prostatic hypertrophy with chronic  urinary retention and multiple perineal and inguinal warts.   OPERATION:  Excision and vaporization of groin warts, and  cystourethroscopy and transurethral resection of the prostate.   SURGEON:  Sigmund I. Patsi Sears, M.D.   ANESTHESIA:  General LMA.   PREPARATION:  After appropriate preanesthesia, the patient is brought to  the operating room, placed on the operating room table in the dorsal  supine position where general LMA anesthesia was induced.  He was then  replaced in dorsal lithotomy position where the pubis was prepped and  draped in usual fashion (allergic to Betadine).   REVIEW OF HISTORY:  This is a 77 year old male, with acute and chronic  urinary retention, relatively small capacity bladder of 275 mL by  urodynamics, IPSS symptom score sheet of 21/7, and a prostate volume was  71.3 mL.  He has a history of negative prostate biopsies, diabetes,  gross hematuria secondary to BPH, and a remote history of ischemic  stroke, nephrolithiasis, and bilateral visual impairment.  He is now for  TURP, and request excision of multiple warts and skin tags from his  genitourinary area.   PROCEDURE:  Using a hand held Bovie unit, multiple skin tags and warts  were removed from the left inguinal area, the left perineum and the  right groin areas.  At the end of the procedure, antibiotic ointment is  placed on the wounds.  There is no  bleeding noted.   Cystourethroscopy was accomplished, and shows markedly enlarged trilobar  BPH with a very high bladder neck.  Cystoscopy reveals a trabeculation  with very difficult visualization.  I do not see true cellules, however.  There is no evidence of bladder stone or tumor.   Resection was begun with the electrovaporization at the 7 o'clock  position, then was carried to the 5 o'clock position.  Care was taken to  avoid any injury to the veru.  Resection was then accomplished from the  11 o'clock to the 7 o'clock position, and from the 1 o'clock to the 5  o'clock positions.  Chips were sent to laboratory for examination.  Careful electrovaporization and cauterization was used but there was  still some subtrigonal venous bleeding.  It is elected to place a three-  way Foley catheter to traction and continuous irrigation.  This was  accomplished.  The patient was awakened and taken to recovery room in  good condition.      Sigmund I. Patsi Sears, M.D.  Electronically Signed     SIT/MEDQ  D:  12/08/2007  T:  12/08/2007  Job:  [id number]  cc:   Patrica Duel, M.D.  Fax: [phone]

## 2010-11-11 NOTE — Procedures (Signed)
NAME:  John [name], John [name] NO.:  [id number]   MEDICAL RECORD NO.:  [id number]          PATIENT TYPE:  INP   LOCATION:  A315                          FACILITY:  [facility]   PHYSICIAN:  Gerrit Friends. Dietrich Pates, MD, FACCDATE OF BIRTH:  1931/04/12   DATE OF PROCEDURE:  08/09/2007  DATE OF DISCHARGE:  08/11/2007                                ECHOCARDIOGRAM   REFERRING:  Skeet Latch, DO, and Gerrit Friends. Dietrich Pates, MD   CLINICAL DATA:  A 78 year old gentleman with hypertension and CVA.   M-mode:  Aorta 3.5, left atrium 4.7, septum 1.5, posterior wall 1.7, LV  diastole 5.0, LV systole 3.7.   1. Technically adequate echocardiographic study.  2. Mild right and left atrial enlargement; right ventricular size at      the upper limit of normal; normal wall thickness; normal RV      systolic function.  3. Pulmonic valve and proximal pulmonary artery poorly-imaged but      appear normal.  4. Normal mitral valve with mild annular calcification and trace      regurgitation.  5. Normal and trileaflet aortic valve; normal proximal ascending      aorta.  6. Normal tricuspid valve.  7. Normal left ventricular size; mild to moderate hypertrophy; normal      regional and global function.  8. Normal IVC.      Gerrit Friends. Dietrich Pates, MD, [facility]  Electronically Signed     RMR/MEDQ  D:  08/12/2007  T:  08/13/2007  Job:  [id number]

## 2010-11-11 NOTE — Group Therapy Note (Signed)
NAME:  John [name], John [name]                ACCOUNT NO.:  [id number]   MEDICAL RECORD NO.:  [id number]          PATIENT TYPE:  INP   LOCATION:  A214                          FACILITY:  [facility]   PHYSICIAN:  Melissa L. Ladona Ridgel, MD  DATE OF BIRTH:  09-10-1930   DATE OF PROCEDURE:  02/18/2009  DATE OF DISCHARGE:                                 PROGRESS NOTE   The patient was seen and examined prior to his transport for MRI down in  [place] today.  He states he slept well.  He is eating well.  His  blood sugars are better controlled.  He is less anxious than he was the  first night that we met.  He states he has not had a bowel movement but  is not nauseated nor does he feel like vomiting.   VITAL SIGNS:  Temperature is 97.3, blood pressure 140/85, pulse 75,  respirations 18, O2 saturation is 93%.  GENERAL:  This is a morbidly obese white male in no acute distress.  HEENT:  He is normocephalic, atraumatic.  Pupils equal, round and  reactive to light.  Extraocular muscles are intact.  Conjunctivae pink.  Moist mucous membranes.  NECK:  Supple.  There is no JVD.  No lymph nodes or carotid bruits.  CHEST:  Decrease with occasional rhonchi that appears with cough.  Distant breath sounds bilaterally.  CARDIOVASCULAR:  Regular rate and rhythm.  Positive S1, S2.  No S3, S4,  murmurs, rubs or gallops.  ABDOMEN:  Obese, nontender, nondistended with positive bowel sounds.  EXTREMITIES:  Show trace to 1 edema.  NEUROLOGIC:  Judgment and insight appear to be slightly impaired with  short-term memory deficits and his visual problems.  PSYCHIATRIC:  The patient has definite anxiety.   ASSESSMENT/PLAN:  This is a 77 year old white male admitted with lower  extremity weakness not taking care of himself at home.  The patient was  unable to have an MRI here and is pending visit to [facility] today for  MRI.  __________ hyperglycemia on steroids.  1. Leg weakness.  We will increase his steroids again tomorrow  but,      for now, he remains on prednisone.  He did undergo an MRI today      which showed various levels of disk bulging, none which were      totally compromising him __________ .  2. Diabetes, uncontrolled.  We will continue sliding scale insulin and      have restarted new coverage which I can increase tomorrow if it      continues to be high.  3. Hypertension.  We will continue his Norvasc.  His Bystolic has been      changed to metoprolol.  He does not appear to be having difficulty      with that.  4. Depression and anxiety.  We will continue his Cymbalta and Ativan.  5. Hyperlipidemia.  We will continue his __________.  6. Constipation.  He underwent a Fleet enema today with reasonable      result.   Total time with this patient was 30 minutes.  Melissa L. Ladona Ridgel, MD  Electronically Signed     MLT/MEDQ  D:  02/19/2009  T:  02/19/2009  Job:  [id number]

## 2010-11-11 NOTE — H&P (Signed)
NAME:  John, [name] NO.:  [id number]   MEDICAL RECORD NO.:  [id number]          PATIENT TYPE:  INP   LOCATION:  A315                          FACILITY:  [facility]   PHYSICIAN:  Skeet Latch, DO    DATE OF BIRTH:  [birth year]   DATE OF ADMISSION:  08/08/2007  DATE OF DISCHARGE:  LH                              HISTORY & PHYSICAL   ADMITTING DIAGNOSES:  1. Cerebrovascular accident.  2. Right-sided weakness with right footdrop.  3. Diabetes.  4. Hypokalemia.   HISTORY OF THE PRESENT ILLNESS:  This is a 77 year old Caucasian male  who presents with a one-week history of right-sided weakness and right  footdrop.  The patient states approximately a week ago he noticed he  started to having to right upper arm numbness with associated right  footdrop.  The patient states today he went to his primary care doctor  and was told he probably had stroke, and to come to the emergency room  for evaluation.  The patient is also complaining of episodes of  diaphoresis that have been ongoing for quite some time, and is unsure of  the cause at this time.   On exam the patient seems to be stable, states he feels a little bit  better and he is no acute distress.  The patient is not having any  dysarthria, dysphagia or any altered mental status changes at this time.   PAST MEDICAL HISTORY:  The past medical history includes:  1. Hyperlipidemia.  2. Hypertension.  3. Diabetes.  4. Acid reflux.  5. Hypercholesterolemia.  6. Peripheral edema.  7. History of previous CVA.   SOCIAL HISTORY:  The patient denies any smoking, alcohol or illicit drug  use.   ALLERGIES:  BETADINE, DARVON AND SULFA.   PAST SURGICAL HISTORY:  1. Prostate surgery.  2. Cholecystectomy.  3. Kidney stone.   REVIEW OF SYSTEMS:  CONSTITUTIONAL:  Positive for diaphoresis.  Denies  any weight loss or weight gain.  HEENT:  The patient has visual changes  that seem to be chronic.  No eye pain, neck or  throat problems, nor  problems swallowing.  RESPIRATORY:  No cough, dyspnea or wheezing.  GASTROINTESTINAL:  No nausea, vomiting or diarrhea.  GENITOURINARY:  The  patient has problems voiding at times.  MUSCULOSKELETAL:  No  arthralgias, back pain or myalgias.  INTEGUMENT:  No new rashes.  No  bruising or pruritus.  NEUROLOGIC:  No headaches or confusion.  Positive  for right lower extremity weakness with footdrop.   MEDICATIONS:  Home medications include:  1. Labetalol 20 mg twice daily.  2. Amlodipine 5 mg daily.  3. Aspirin 81 mg daily.  4. Actos 30 mg daily.  5. Protonix 40 mg daily.  6. Ramipril 10 mg twice a day.  7. Lexapro 20 mg at night.  8. Ilmipramide 2 mg at night.   PHYSICAL EXAMINATION:  VITAL SIGNS:  Temperature is 97.5, pulse 68,  respirations 20, blood pressure 165/95, and the patient is sating at 94%  on room air.  GENERAL APPEARANCE:  In general this is  a 77 year old who is well-  nourished, well-hydrated, and in no acute distress.  The patient is  pleasant and cooperative.  He is alert and oriented times three.  HEART:  Cardiovascularly he has regular rate and rhythm with no murmurs,  rubs or gallops.  LUNGS:  The lungs are clear to auscultation bilaterally.  No rales,  rhonchi or wheezes.  ABDOMEN:  The patient's abdomen is obese and soft.  Slightly tender in  the right lower quadrant.  No rigidity or guarding,  EXTREMITIES:  The extremities show no clubbing, cyanosis or edema.  NEUROLOGIC EXAMINATION:  The patient has some right hand numbness and  tingling, and some right footdrop with gait.  Overall strength seems to  be intact.   LABORATORY DATA:  The BNP is 34.5.  Sodium 141, potassium 3.3, chloride  101, CO2 32, glucose 123, BUN 12, and creatinine 1.21.  White count  11.1, hemoglobin 13.8, hematocrit 40.2 and platelets 357,000.   ASSESSMENT:  1. Cerebrovascular accident.  2. Hypokalemia.  3. Hypertension.  4. Diabetes.   PLAN:  1. For his CVA;  since the patient does not have any obvious dysphagia      we will place the patient on a soft mechanical diet and increase to      a diabetic diet after he is seen by speech therapy.  We will get a      urine C&S, sed red and C-reactive protein.  We will get an EKG, 2-D      echo and a bilateral carotid Doppler in the A.M.  The patient will      be placed on aspirin daily as well as vital signs and neuro check      every 4 hours for 24 hours and then every shift.  We will get PT/OT      evaluation  and consult neurology.  2. For his hypokalemia we will replace this orally and will get a      repeat in the morning.  3. For his diabetes the patient will be placed on a minor sliding      scale with blood sugars check before meals and at bedtime; and,      will be placed on his home medications.  4. We will get an MRI of his brain in the A.M. also.  5. The patient will begin follow __________  closely at this time and      we will await neurological recommendations.      Skeet Latch, DO  Electronically Signed     SM/MEDQ  D:  08/09/2007  T:  08/09/2007  Job:  [id number]

## 2010-11-13 LAB — SODIUM
ALT(SGPT): 16
Albumin: 4.2
Alkaline Phosphatase: 51 U/L
CO2: 28 mmol/L
Potassium: 4.2 mmol/L
Protein, Total: 6.5
Sodium: 141 mmol/L (ref 137–147)
Total Bilirubin: 0.4 mg/dL

## 2010-11-14 NOTE — Op Note (Signed)
NAME:  John [name], John [name]                ACCOUNT NO.:  [id number]   MEDICAL RECORD NO.:  [id number]          PATIENT TYPE:  INP   LOCATION:  A228                          FACILITY:  [facility]   PHYSICIAN:  Lionel December, M.D.    DATE OF BIRTH:  March 19, 1931   DATE OF PROCEDURE:  09/22/2004  DATE OF DISCHARGE:  09/24/2004                                 OPERATIVE REPORT   OPERATION/PROCEDURE:  Small bowel Given capsule study.   INDICATIONS:  [name] is a 77 year old Caucasian male with acute GI bleed with  anemia requiring transfusion.  He had normal colonoscopy and terminal  ileoscopy yesterday and essentially negative followed by EGD which revealed  findings which would not account for his GI bleed.  He is, therefore,  undergoing this study.  Procedure and risks were reviewed with the patient  and informed consent was obtained.   FINDINGS:  The patient was able to swallow Given capsule without any  difficulty.  It made it to the stomach in 34 seconds and into the duodenum  at two hours.  It reached cecum in six hours.  There were a few small  jejunal ulcers with a clean base.  However, just distal to these ulcers  there were areas with active bleeding or oozing.  Underlying lesion was not  seen but suspected to be an ulcer or erosions.  There was coffee ground  material in the ileum and also in the cecum.   ASSESSMENT:  A few jejunal ulcers with stigmata of active bleeding; i.e.  oozing.  These changes are felt to be secondary to Naprosyn and low-dose  aspirin.   RECOMMENDATIONS:  Since the patient appears to be actively bleeding, he will  need to be monitored in the hospital for another 24-48 hours.   The patient advised not to take any NSAIDs.  However, if he does well, he  can resume low-dose aspirin in two weeks from now.      NR/MEDQ  D:  09/24/2004  T:  09/25/2004  Job:  [id number]   cc:   Lucky Cowboy, M.D.  [address]  [place], [place] [address]  Fax: [phone]

## 2010-11-14 NOTE — H&P (Signed)
NAME:  John [name], John [name] NO.:  [id number]   MEDICAL RECORD NO.:  [id number]          PATIENT TYPE:  INP   LOCATION:  IC09                          FACILITY:  [facility]   PHYSICIAN:  Kirk Ruths, M.D.DATE OF BIRTH:  October 13, 1930   DATE OF ADMISSION:  09/20/2004  DATE OF DISCHARGE:  LH                                HISTORY & PHYSICAL   CHIEF COMPLAINT:  Bloody stools.   PRESENTING ILLNESS:  A 77 year old male without any previous abdominal  problems, stated that on the evening before admission, before he went to  bed, he had a bowel movement, and noticed some blood in the stools. The  patient subsequently, a couple hours later, had some rumbling and gas, and  another bowel movement, which he stated was normal brown color.  A couple of  hours later, he had a significant amount of gas, and went to the bathroom,  where he had a bloody stool.  He got somewhat lightheaded, although he did  not lose consciousness, and fell on the floor, and then subsequently had a  large amount of bloody stools on the bathroom floor.   The patient in the emergency room was found to have bright red blood in the  stool.  Vitals were stable.  Hemoglobin on admission is 9.3.  White count  11.8.  The patient is admitted for GI bleeding.   PAST MEDICAL HISTORY:   ALLERGIC:  HE IS ALLERGIC TO BETADINE, DIOVAN, AND SULFA.   CURRENT MEDICATIONS:  1.  Norvasc 5 mg a day.  2.  Altace 10 mg a day.  3.  Labetalol 200 daily.  4.  Aspirin 81 mg daily.  5.  Proscar 5 mg daily.  6.  Nexium 40 mg daily.  7.  Amaryl 2 mg daily.  8.  Zoloft 50 mg daily.  9.  Xanax 0.25 p.r.n.   The patient is a type 2 diabetic, had previous CVA, from which he has no  residual problems other than slight change in his memory.  The patient is  status post prostate surgery for BPH.  He has hypertension and above-  mentioned type 2 diabetes.   REVIEW OF SYSTEMS:  Denies chest, shortness of breath and nausea.   PHYSICAL EXAMINATION:  GENERAL:  An obese, elderly male, in no acute  distress.  VITAL SIGNS:  He is afebrile.  Blood pressure 130/70, pulse 77 and regular,  respirations 20 and unlabored.  HEENT:  TMs are normal.  Pupils are equal, react to accommodation.  Oropharynx is benign.  NECK:  Supple without JVD, bruits or thyromegaly.  LUNGS:  Clear in all areas.  HEART:  Regular rate and rhythm without murmur, gallop or rub.  ABDOMEN:  Soft, nontender.  Positive bowel sounds.  EXTREMITIES:  Without clubbing, cyanosis or edema.  NEUROLOGIC:  Grossly intact.   ASSESSMENT:  1.  Gastrointestinal bleeding, probably colonic.  2.  Type 2 diabetes.  3.  Hypertension.  4.  Status post cerebrovascular accident.      WMM/MEDQ  D:  09/20/2004  T:  09/20/2004  Job:  [id number]

## 2010-11-14 NOTE — Discharge Summary (Signed)
NAME:  John [name], John [name] NO.:  [id number]   MEDICAL RECORD NO.:  [id number]          PATIENT TYPE:  INP   LOCATION:  A228                          FACILITY:  [facility]   PHYSICIAN:  Kirk Ruths, M.D.DATE OF BIRTH:  Dec 29, 1930   DATE OF ADMISSION:  09/20/2004  DATE OF DISCHARGE:  03/29/2006LH                                 DISCHARGE SUMMARY   DISCHARGE DIAGNOSES:  1.  Gastrointestinal bleed secondary to jejunal ulcer.  2.  Near syncope.  3.  Anemia secondary to gastrointestinal bleed.  4.  Type 2 diabetes.  5.  History of cerebrovascular accident.  6.  History of hypertension.  7.  Benign prostatic hypertrophy surgery.   HOSPITAL COURSE:  This 77 year old male was admitted after having some  bloody stools during the night, a lot of gas and near syncope. The patient,  in the emergency room, was found have a hemoglobin 9.3, white count 11.8.  The patient was admitted, transfusion was begun. His hemoglobin climbed to  11.5 after transfusion. The patient was seen by GI, underwent EGD, and a  total colonoscopic exam, both of which did not reveal any evidence of bleed.  The patient also had acute abdominal series, which was negative. The  patient's pro times were normal. Chemistries showed sodium slightly low at  131, normal potassium. The patient also had a negative H. pylori during this  study. The patient's stools stabilized but then had a re bleed of minor  extent. He underwent camera capsule exam, which showed jejunal ulcers with  bleeding, probably secondary the NSAID use. The patient was advised to avoid  NSAID. He was discharged in stable condition, having had normal stools. He  was placed on Protonix and his previous medications. To be followed in the  office in 1 week.      WMM/MEDQ  D:  10/21/2004  T:  10/21/2004  Job:  [id number]

## 2010-11-14 NOTE — Op Note (Signed)
NAME:  John [name], John [name]                ACCOUNT NO.:  [id number]   MEDICAL RECORD NO.:  [id number]          PATIENT TYPE:  INP   LOCATION:  IC09                          FACILITY:  [facility]   PHYSICIAN:  Lionel December, M.D.    DATE OF BIRTH:  10-23-1930   DATE OF PROCEDURE:  09/20/2004  DATE OF DISCHARGE:                                  PROCEDURE NOTE   REASON FOR CONSULTATION:  Gastrointestinal bleed and anemia.   HISTORY OF PRESENT ILLNESS:  Jung is a 77 year old Caucasian male who was  admitted to Dr. Edison Simon service earlier today by emergency room when he  presented with acute onset of rectal bleeding and a syncopal episode.   The patient states he was fine until around midnight last night when he had  an urge to have a bowel movement, and he passed a small amount of blood.  He  went to bed and woke up twice and passed more blood.  The third time he  passed a large amount of blood.  He also had fleeting pain in the left mid  and upper abdomen.  He had a fourth episode of bleeding outside the bathroom  and he passed out.  He recalled there was a large amount of blood and clots.  His wife called for help and he was brought to the emergency room.  He was  seen by Dr. Colon Branch.  His H&H was 9.3 and 26.8.  He was admitted to ICU, he  was type and crossed matched, and he is receiving a second unit of PRBC's.  He did have a H&H about two hours after admission which is 9.8 and 28.3.  He  has not passed any blood per rectum since arriving to ICU.  He denies  nausea, vomiting, hematemesis, or chronic abdominal pain.  His bowels  generally move regularly.  He does not recall that he has ever had  colonoscopy.  He has been on low dose ASA and Naprosyn.  He is not even sure  why he is taking Naprosyn.  He has a good appetite and has not lost any  weight recently.  There is no history of peptic ulcer disease.   He was given a single dose of Protonix IV in the emergency room.  He is on  Zoloft and  Xanax.   HOME MEDICATIONS:  1.  Norvasc 5 mg daily.  2.  Altace 10 mg daily.  3.  Labetalol 200 mg daily.  4.  Proscar 5 mg daily.  5.  Nexium 40 mg q.a.m.  6.  Zoloft 50 mg daily.  7.  Amaryl 2 mg daily.  8.  Xanax 0.375 mg daily.  9.  Naprosyn 500 mg b.i.d. p.r.n.  10. ASA 81 mg daily.   PAST MEDICAL HISTORY:  1.  He has been a diabetic for four to five years.  2.  Hypertension.  3.  Tells me he had a stroke four to five years ago which affected his      memory.  4.  GERD.  5.  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  6.  Status post bilateral cataract extraction.  7.  Status post ________________.  8.  He had Homian laser TURP in September 2005.  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  9.  Lumbar spine surgery.  10. Kidney stones.   ALLERGIES:  1.  DARWIN.  2.  SULFA.  3.  BETADINE.   FAMILY HISTORY:  Noncontributory.  One sister died of unknown malignancy and  eight siblings are living.   SOCIAL HISTORY:  He is married, he has three children (second marriage).  He  is retired from [employer] where he worked for almost 30 years.  He smoked  for a couple of months as a teenager, but quit.  He does not drink alcohol.   PHYSICAL EXAMINATION:  GENERAL:  A pleasant, morbidly obese Caucasian male  who is in no acute distress.  VITAL SIGNS:  Admission weight 244.8 pounds, he is 67 inches tall.  Pulse 69  per minute, blood pressure 168/66, respirations 20, temperature is 97.  HEENT:  Conjunctivae does not appear to be pink.  Sclerae is nonicteric.  Oropharyngeal mucosa is normal.  He has upper dental plate.  NECK:  No neck masses or thyromegaly noted.  He has multiple  _____________skin lesions at his neck as well as upper abdomen.  No  thyromegaly or lymphadenopathy noted.  CARDIAC:  Regular rhythm, normal S1 and S2.  No murmur or gallop noted.  LUNGS:  Clear to auscultation.  ABDOMEN:  Obese,  bowel sounds are normal.  On palpation, is soft and  nontender without organomegaly or masses.  RECTAL:  Deferred.  EXTREMITIES:  He does not have clubbing or peripheral edema.   LABORATORY DATA:  From admission:  WBC 11.8, hemoglobin 9.3, hematocrit  26.8, platelet count 391,000.  INR 1, PTT 30.  Sodium 131, potassium 3.9,  chloride 102, CO2 26, glucose 161, BUN 23, creatinine 1.4.  Bilirubin 0.6,  alkaline phosphatase 80, AST 21, ALT 32.  His albumin is 2.6.  Protein  recorded to be 4.4, which may be an error.  His calcium is 7.4.  His  troponin-I level was less than 0.05.   ASSESSMENT:  Mahir is a 77 year old Caucasian male who presents with copious  lower gastrointestinal bleed associated with syncopal episodes.  Hemoglobin  and hematocrit on admission is low at 9.3 and 26.8, and he is receiving a  second unit of packed red blood cells.  He has not had a bowel movement in  the last six hours or so which is reassuring.  The patient has been on low-  dose ASA plus Naprosyn.  I suspect that he has colonic diverticular bleed,  but he could also be bleeding from the small bowel.  Upper gastrointestinal  tract from mid small bowel.  Lesion in upper gastrointestinal tract is  possible, but unlikely.   I agree with treating him with packed red blood cells and proton pump  inhibitor.  Suspect his hemoglobin and hematocrit would fall post-hydration.   RECOMMENDATIONS:  1.  Serial hemoglobin's and hematocrit's as you are doing.  2.  He will be prepped with GoLYTELY for a colonoscopy to be performed in      the a.m.  If colon is entirely clean, we will do      esophagogastroduodenoscopy as well.   I would like to thank Dr. Regino Schultze for the opportunity to  participate in the  care of this gentleman.      NR/MEDQ  D:  09/20/2004  T:  09/20/2004  Job:  [id number]

## 2010-11-14 NOTE — Op Note (Signed)
NAME:  John [name], John [name]                ACCOUNT NO.:  [id number]   MEDICAL RECORD NO.:  [id number]          PATIENT TYPE:  INP   LOCATION:  IC09                          FACILITY:  [facility]   PHYSICIAN:  Lionel December, M.D.    DATE OF BIRTH:  28-Jan-1931   DATE OF PROCEDURE:  09/20/2004  DATE OF DISCHARGE:                                 OPERATIVE REPORT   PROCEDURE:  Total colonoscopy followed by esophagogastroduodenoscopy.   INDICATIONS:  John [name] is a 77 year old Caucasian male who presented yesterday  with acute onset of GI bleed.  He passed bright red blood and clots per  rectum.  He had been on low dose ASA and Naprosyn.  He has received 2 units  of PRBCs, and hemoglobin this morning was 11.3.  He is suspected to have  colonic source and therefore undergoing procedure.  If colonoscopy is  negative, he will undergo EGD.  Both the procedural risks were reviewed with  the patient, and he was agreeable.   PREOP MEDICATIONS:  Demerol 50 mg IV, Versed 6 mg IV,  Cetacaine spray for  oropharyngeal topical anesthesia.   FINDINGS:  Procedure performed in endoscopy suite.  The patient's vital  signs and O2 sat were monitored during the procedure and remained stable.   FINDINGS:  Procedure was performed in ICU at bedside.  The patient's vital  signs and O2 sat were monitored during the procedure and remained stable.  The patient was placed in the left lateral decubitus position and remained  stable.   PROCEDURE #1:  Total colonoscopy:  Rectal examination performed.  No  abnormality noted on external digital exam.  Olympus videoscope was placed  in the rectum and advanced under vision into the sigmoid colon beyond.  Preparation was satisfactory.  He had a single small clot in his splenic  flexure, but this was easily washed, revealing no underlying lesion.  The  scope was passed into the cecum, which was identified by appendiceal stump  and ileocecal valve.  Pictures taken for the record show a  segment of  ______, otherwise exam was normal.  As the scope was withdrawn, colonic  mucosa was examined for the second time.  There was a single small polyp at  the descending colon, which was ablated by cold biopsy.  There were no  diverticula, AV malformations, or other lesions or circumferential bleeding.  Rectal mucosa was normal.  The scope was retroflexed and examined.  Anorectal junction was unremarkable.  The endoscope was then withdrawn, and  the patient prepared for procedure #2.   PROCEDURE #2:  Esophagogastroduodenoscopy:  The Olympus video scope was  passed through the oropharynx without any difficulty into the esophagus.   ESOPHAGUS:  The mucosa is normal.  GE junction was at 42 cm from the  incisors from the incision.   STOMACH:  Empty and distended very well with insufflation.  Folds of the  proximal stomach are normal.  There is a polyp at a nodular antrum along  with erosion on the left side, but there was no stigmata of bleeding.  There  were a  few focal erythematous areas in the antrum and erosions without  stigmata of bleeding.  The angularis was normal.  There was another small  polyp just below the cardia covered with mucosa that was erythematous at the  tip, but there was no active bleeding.  Antral polyp was biopsied for  histology.  The polyp at the fundus was also biopsied.  There was some  oozing, which was easily controlled with the help of the gold probe or  bipolar cautery.   DUODENUM:  Bulbar mucosa was normal.  The scope was passed into the second  and third part of the duodenum.  The mucosa and folds are normal.  The  endoscope was withdrawn.  Patient tolerated the procedure well.   FINAL DIAGNOSES:  1.  Normal colonoscopy and terminal ileoscopy except for finding of a small      polyp at the descending colon, which was ablated by cold biopsy.  2.  Erosive antral gastritis with two polyps, one at the antrum and another      one just below the  cardia without stigmata of bleeding.  Both of these      are biopsies for histology.  Some oozing noted from fundal polyp which      is easily controlled with bipolar cautery.  3.  Normal examination of esophagus, bulb, and postbulbar duodenum.   RECOMMENDATIONS:  H. pylori to be checked.  We will change his Protonix to  40 mg p.o. b.i.d.  He will undergo a given capsule study in the a.m.  He  will also have H&H repeated later today and in the a.m.      NR/MEDQ  D:  09/21/2004  T:  09/21/2004  Job:  [id number]   cc:   John [name], M.D.  [address]  [place]  [place] [address]  Fax: [phone]

## 2010-11-14 NOTE — Op Note (Signed)
NAME:  ABDULKARIM, John [name]                          ACCOUNT NO.:  [id number]   MEDICAL RECORD NO.:  [id number]                   PATIENT TYPE:  INP   LOCATION:  0003                                 FACILITY:  [facility]   PHYSICIAN:  Sigmund I. Patsi Sears, M.D.         DATE OF BIRTH:  05/19/1931   DATE OF PROCEDURE:  03/06/2004  DATE OF DISCHARGE:                                 OPERATIVE REPORT   PREOPERATIVE DIAGNOSIS:  Urinary retention with benign prostatic  hypertrophy.   POSTOPERATIVE DIAGNOSIS:  Urinary retention with benign prostatic  hypertrophy.   OPERATION:  [place] laser, transurethral resection of the prostate.   SURGEON:  Sigmund I. Patsi Sears, M.D.   ANESTHESIA:  General LMA.   PREPARATION:  After appropriate preanesthesia, the patient is brought to the  operating room and placed on the operating room table in a dorsal supine  position, where general LMA anesthesia was then introduced.  He was then  replaced in the dorsal lithotomy position where the pubis was prepped with  Betadine solution and draped in the usual fashion.   PROCEDURE:  Cystourethroscopy was accomplished.  A suprapubic tube was  placed without difficulty in a standard fashion with a Lowsley retractor and  the patient in Trendelenburg position and cut down.  Following successful  cystoscopic-proven placement of the 16 French Foley catheter, the continuous  __________scope was placed, and the holmium laser was used to perform a TURP  with tissue lased from the 7 and the 5 o'clock positions and from the 8  o'clock to the 4 o'clock positions.  Ninety minutes of laser was  accomplished.  Following this, minimal bleeding, if any, was noted.  A 24  Ainsworth catheter was passed, and the patient was awakened and taken to the  recovery room.                                               Sigmund I. Patsi Sears, M.D.    SIT/MEDQ  D:  03/06/2004  T:  03/06/2004  Job:  [id number]

## 2010-11-17 ENCOUNTER — Ambulatory Visit (INDEPENDENT_AMBULATORY_CARE_PROVIDER_SITE_OTHER): Payer: Medicare Other | Admitting: Urgent Care

## 2010-11-17 ENCOUNTER — Encounter: Payer: Self-pay | Admitting: Urgent Care

## 2010-11-17 DIAGNOSIS — Z8719 Personal history of other diseases of the digestive system: Secondary | ICD-10-CM

## 2010-11-17 DIAGNOSIS — D649 Anemia, unspecified: Secondary | ICD-10-CM

## 2010-11-17 NOTE — Assessment & Plan Note (Signed)
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  Doing well.  Avoiding NSAIDs.  Some fatigue.  Will recheck CBC.

## 2010-11-17 NOTE — Patient Instructions (Signed)
We will call once I review labs from [facility] Continue protonix 40mg  daily Call if any severe abd pain or recurrent bleeding Avoid all over the counter goody's, bc powders, aleve, ibuprofen, advil, etc

## 2010-11-17 NOTE — Progress Notes (Signed)
Referring Provider: Colette Ribas, MD Primary Care Physician:  Colette Ribas, MD Primary Gastroenterologist:  Dr. Jena Gauss  Chief Complaint  Patient presents with  . Follow-up    HPI:  John [name] is a 77 y.o. male here for follow up for folow-up GI bleed.  Was inpatient at [facility] for GI bleed felt to be due to gastric and small bowel AVMs exacerbated by NSAID use.  Had EGd w/ bicap/clip & SB Givens capsule study as below.  Tagged RBC study did not localize bleeding.  He has done well since discharge.  Went 1 week without BM after colon prep.  Now BM QOD. Passing lots flatus.  Denies any rectal bleeding or melena.  C/o fatigue & weakness.  C/o left mid-abd pain.  Hx B12 injections years ago, but has not had any injections in over 2 yrs.  c/o nausea that is intermittent mid-morning.  Feels better w/eating.  No vomiting.  Wt stable.  Denies heartburn & indigestion.  Taking protonix 40mg  daily.  Taking Niferex 150mg  BID.  Avoiding all NSAIDS.   Hgb 8.3 on discharge (10/29/10).  Has Office visit w/ urologist to discuss abnormal area in trigone bladder seen on CT.  Sees cardiologist in June,  Past Medical History  Diagnosis Date  . GI bleed     2006, TCS/TI showed small polyp. EGD showed erosive antral gastritis with two polyp, one oozing and tx. HP neg. SB capsule shoed few jejunal ulcers with bleeding (naproxen and ASA)  . GI bleed     02/2010, EGD->pancreatitc rest, fundal gland polyps,  no bleeding. TCS->blood-tinged colonic effluent throughout the colon without bleeding lesion found, nl TI. SB capsule->SB erosions, nonbleeding, incomplete study. Patient on naproxyn.   . DM (diabetes mellitus)   . Pneumonia   . Hyperlipidemia   . HTN (hypertension)   . Stroke   . GERD (gastroesophageal reflux disease)   . Poor vision     left eye  . Nephrolithiasis   . Sleep apnea   . Obesity   . Ischemic colitis, enteritis, or enterocolitis     flex sig 01/2010  . Chronic renal insufficiency   .  Gastric AVM 10/24/10    GI bleed EGD w/ bicap and hemoclip placement, 2 AVMs, presented w/bright red rectal bleeding  . GI bleed 10/27/10    ileocolonoscopy/GIVENS capsule study by Dr Rourk->bloody colonic effluent throughout colon but no lesion identified, TA @ hepatic flexure, fresh blood in dital SB on capsule  . Tubular adenoma of colon 10/27/10    on colonoscopy Dr Jena Gauss    Past Surgical History  Procedure Date  . Back surgery   . Transurethral resection of prostate   . Cataract extraction     bilateral  . Appendectomy   . Cholecystectomy   . Elbow surgery     Current Outpatient Prescriptions  Medication Sig Dispense Refill  . amLODipine (NORVASC) 10 MG tablet Take 10 mg by mouth daily.        . Choline Fenofibrate (TRILIPIX) 135 MG capsule Take 135 mg by mouth daily.        . furosemide (LASIX) 40 MG tablet Take 20 mg by mouth daily.        . iron polysaccharides (NIFEREX) 150 MG capsule Take 150 mg by mouth 2 (two) times daily.        Marland Kitchen LANTUS SOLOSTAR 100 UNIT/ML injection       . Multiple Vitamins-Minerals (ONE-A-DAY 50 PLUS PO) Take 1 tablet by mouth daily.        Marland Kitchen  nebivolol (BYSTOLIC) 5 MG tablet Take 2.5 mg by mouth daily.        . pantoprazole (PROTONIX) 40 MG tablet Take 40 mg by mouth daily.        . Polyethylene Glycol 3350 POWD Take 17 g by mouth daily as needed.  527 g  5  . potassium chloride (K-DUR) 10 MEQ tablet Take 10 mEq by mouth daily.        . ramipril (ALTACE) 10 MG capsule Take 10 mg by mouth 3 (three) times daily.        . sitaGLIPtan (JANUVIA) 100 MG tablet Take 50 mg by mouth daily.        Marland Kitchen terazosin (HYTRIN) 5 MG capsule Take 5 mg by mouth at bedtime.        Marland Kitchen ACCU-CHEK AVIVA PLUS test strip       . B-D ULTRAFINE III SHORT PEN 31G X 8 MM MISC         Allergies as of 11/17/2010 - Review Complete 11/17/2010  Allergen Reaction Noted  . Latex    . Povidone-iodine    . Propoxyphene hcl    . Sulfonamide derivatives      Family History: There is  no known family history of colorectal carcinoma , liver disease, or inflammatory bowel disease.  Problem Relation Age of Onset  . Aneurysm Daughter     brain, deceased age 89  . GI problems Brother     gi bleed  . Cancer Sister     unknown type  . Colon cancer Neg Hx   . Liver disease Neg Hx     History   Social History  . Marital Status: Widowed    Spouse Name: N/A    Number of Children: 2  . Years of Education: N/A   Occupational History  . retired     [employer]   Social History Main Topics  . Smoking status: Never Smoker   . Smokeless tobacco: Never Used  . Alcohol Use: No  . Drug Use: No  . Sexually Active: No   Review of Systems: Gen: Denies any fever, chills, sweats.  C/O AWAKENING STARTLED & ANXIOUS AT NIGHT.  Had sleep apnea test but did not get results. Resp: Chronic DOE.  Denies cough, sputum, wheezing, coughing up blood, and pleurisy. GI: Denies vomiting blood, jaundice, and fecal incontinence.   Denies dysphagia or odynophagia. Derm: Denies rash, itching, dry skin, hives, moles, warts, or unhealing ulcers.  Psych: Denies depression, anxiety, memory loss, suicidal ideation, hallucinations, paranoia, and confusion. Heme: Denies bruising, bleeding, and enlarged lymph nodes.  Physical Exam: BP 172/82  Pulse 72  Temp(Src) 98.3 F (36.8 C) (Temporal)  Ht 5\' 7"  (1.702 m)  Wt 247 lb 3.2 oz (112.129 kg)  BMI 38.72 kg/m2 General:   Alert,  Well-developed, obese, pleasant and cooperative in NAD Head:  Normocephalic and atraumatic. Eyes:  Sclera clear, no icterus.   Conjunctiva pink. Mouth:  No deformity or lesions, dentition normal. Neck:  Supple; no masses or thyromegaly. Heart:  Regular rate and rhythm; no murmurs, clicks, rubs,  or gallops. Abdomen:  Soft, obese, nontender and nondistended. Multiple nevi.  No masses, hepatosplenomegaly or hernias noted. Normal bowel sounds, without guarding, and without rebound.   Msk:  Symmetrical without gross deformities.  Normal posture. Pulses:  Normal pulses noted. Extremities:  C/o clubbing.   Neurologic:  Alert and  oriented x4;  grossly normal neurologically. Skin:  Intact without significant lesions or rashes. Cervical Nodes:  No significant cervical adenopathy. Psych:  Alert and cooperative. Normal mood and affect.

## 2010-11-17 NOTE — Progress Notes (Signed)
Please call pt--[facility] did not check CBC. He needs CBC & anemia panel I ordered. Thanks

## 2010-11-17 NOTE — Progress Notes (Signed)
Cc to PCP 

## 2010-11-17 NOTE — Assessment & Plan Note (Addendum)
Recheck Hgb.  No further gross bleeding.  Hx B-12 deficiency requiring injections, but has not received in over 2 yrs.  May need to re-evaluate.  Continue iron BID,    Office visit in 2 months w/ Dr Jena Gauss. FU w/ Dr Phillips Odor regarding sleep apnea. FU w/ your cardiologist & urologist as planned. Continue protonix 40mg  daily Call if any severe abd pain or recurrent bleeding Avoid all over the counter goody's, bc powders, aleve, ibuprofen, advil, etc

## 2010-11-18 NOTE — Progress Notes (Signed)
Pt aware.

## 2010-11-18 NOTE — Progress Notes (Signed)
Lab order faxed to lab. 

## 2010-11-20 LAB — ANEMIA PANEL 7
ABS Retic: 114.8 10*3/uL (ref 19.0–186.0)
Ferritin: 15 ng/mL — ABNORMAL LOW (ref 22–322)
MCV: 83.8 fL (ref 78.0–100.0)
Platelets: 300 10*3/uL (ref 150–400)
RBC: 3.96 MIL/uL — ABNORMAL LOW (ref 4.22–5.81)
RDW: 14.5 % (ref 11.5–15.5)
Retic Ct Pct: 2.9 % (ref 0.4–3.1)
WBC: 11 10*3/uL — ABNORMAL HIGH (ref 4.0–10.5)

## 2010-11-20 LAB — CBC WITH DIFFERENTIAL/PLATELET
Basophils Absolute: 0.1 10*3/uL (ref 0.0–0.1)
Basophils Relative: 1 % (ref 0–1)
Eosinophils Absolute: 0.3 10*3/uL (ref 0.0–0.7)
MCH: 26.1 pg (ref 26.0–34.0)
MCHC: 30.4 g/dL (ref 30.0–36.0)
Monocytes Relative: 8 % (ref 3–12)
Neutro Abs: 7 10*3/uL (ref 1.7–7.7)
Neutrophils Relative %: 67 % (ref 43–77)
Platelets: 307 10*3/uL (ref 150–400)
RDW: 14.5 % (ref 11.5–15.5)

## 2010-11-21 NOTE — Op Note (Signed)
  NAME:  John [name], John [name]                ACCOUNT NO.:  [id number]  MEDICAL RECORD NO.:  [id number]           PATIENT TYPE:  I  LOCATION:  A327                          FACILITY:  [facility]  PHYSICIAN:  R. Roetta Sessions, M.D. DATE OF BIRTH:  1931-06-22  DATE OF PROCEDURE:  10/24/2010 DATE OF DISCHARGE:  10/29/2010                              OPERATIVE REPORT   PROCEDURE:  Capsule study with small bowel.  INDICATIONS FOR PROCEDURE:  Elderly gentleman with recurrent GI bleeding of obscure etiology prior evaluation included colonoscopy, EGD and capsule study (incomplete), blood-tinged colonic effluent was seen on prior colonoscopy that discrete bleeding lesion seen.  This admission, EGD demonstrated a AVM in the stomach, which was thermally sealed and clipped.  Capsule was deployed into the small bowel.  CAPSULE FINDINGS AS FOLLOWS:  First duodenal image after deployment 1 minute 3 seconds.  The capsule did reach the cecum during the study at 2hours in 2 minutes, and 3 hours in 8 minutes and the study just appearing superficial erosions were seen in the jejunum.  There also appeared to be a single diverticulum in the jejunum in 4 hours 46 minutes.  In the more distal small bowel most likely the near the terminal ileum, there was some blood tinged fluid without an active bleeding lesion or even a suspect lesion found.  Please note images at 6 hours 50 minutes and 6 hours 54 minutes.  First ileocecal valve image was acquired at 7 hours 1 minute.  The first cecal image was acquired almost simultaneously.  Again the very distal terminal ileum and in the cecum, there was blood tinged effluent without active bleeding lesion seen.  RECOMMENDATIONS:  Proceed with ileocolonoscopy as the origin of bleeding seems to be in the area of the very distal terminal ileum versus cecum.  It is notable that it was brought to my attention by the patient's daughter that she has observed the patient taking  numerous Advil tablets over the past couple of weeks.     [name], M.D.     RMR/MEDQ  D:  11/18/2010  T:  11/18/2010  Job:  [id number]  cc:   Mila Homer. Sudie Bailey, M.D. Fax: [phone]  Electronically Signed by Lorrin Goodell M.D. on 11/21/2010 01:47:49 PM

## 2010-11-25 NOTE — Op Note (Signed)
NAME:  John [name], John [name] NO.:  [id number]  MEDICAL RECORD NO.:  [id number]           PATIENT TYPE:  LOCATION:                                 FACILITY:  PHYSICIAN:  Jonette Eva, M.D.     DATE OF BIRTH:  09/14/1930  DATE OF PROCEDURE:  10/24/2010 DATE OF DISCHARGE:                               PROCEDURE NOTE   REFERRING PHYSICIAN:  Dr. Sherrie Mustache.  PROCEDURE:  Esophagogastroduodenoscopy with bipolar circumactive probe (7-French, 20-25 watts) and hemoclip placement to two gastric arteriovenous malformations.  INDICATION FOR EXAM:  John [name] is a 77 year old male who has had intermittent gastrointestinal bleeding since 2006.  His last GI evaluation was in October 2011.  At that time, he had a colonoscopy which noted blood-tinged fluid in the colon but no lesion identified and a normal terminal ileum.  He had a capsule placed, which was an incomplete study.  He had several erosions but no active bleeding.  He presented on October 23, 2010, with bright red blood per rectum.  Upper endoscopy is being performed to evaluate the etiology for his current GI bleed.  His hemoglobin initially was 10.7 and dropped to 8.0.  FINDINGS: 1. Two mid body gastric AVMs.  One at stigmata of bleeding and     appeared to have a small clot associated with a small linear red     lesion which appeared to be blood from previous bleeding.  There     was no additional blood seen in the stomach.  Mild streaky erythema     in the antrum.  The lesions were initially BICAPed with 20 watts.     Each lesion started to bleed and additional cautery was applied.     The most distal lesion appeared to continue to actively ooze in     spite of increasing the amount of cautery at 25 watts.  One [place]     resolution clip was placed.  Hemostasis was achieved. 2. The Givens capsule was placed in the small intestine. 3. Normal esophagus without evidence of Barrett's, mass, erosion,     ulceration, or  stricture. 4. Normal duodenal bulb and second portion of the duodenum.  No old     blood or fresh blood seen in the duodenum.  DIAGNOSES: 1. Possible gastrointestinal bleed secondary to gastric arteriovenous     malformations. 2. Successful placement of a Givens capsule.  RECOMMENDATIONS: 1. He may have clear liquids around 3:00 p.m. and then soft mechanical     low-fat diet. 2. We will give him Reglan x1 to promote small bowel motility. 3. We will re-capsule on October 25, 2010.  Dr. Jena Gauss will read the     capsule study on October 25, 2010. 4. No MRI for 30 days. 5. We would avoid aspirin, NSAIDs, and anticoagulation and continue     mechanical VT prophylaxis.  MEDICATIONS: 1. Demerol 50 mg IV. 2. Versed 5 mg IV.  PROCEDURE TECHNIQUE:  Physical exam was performed.  Informed consent was obtained from the patient after explaining benefits, risks, and alternatives to the procedure.  The patient was connected  to the monitor and placed in the left lateral position.  Continuous oxygen was provided by nasal cannula and IV medicine was administered through an indwelling cannula.  After administration of sedation, the patient's esophagus was intubated and scope was advanced under direct visualization to the second portion of the duodenum.  Scope was removed slowly by carefully examining the color, texture, anatomy, and integrity of the mucosa on the way out.  The Givens capsule was placed in the delivery device.  The capsule and scope were advanced under limited visualization to the second portion of the duodenum.  The capsule was released.  The scope was removed slowly, and the patient was recovered in endoscopy, and discharged to the floor in satisfactory condition.     Jonette Eva, M.D.     SF/MEDQ  D:  10/24/2010  T:  10/24/2010  Job:  [id number]  cc:   Tesfaye D. Felecia Shelling, MD Fax: [phone]  Electronically Signed by Jonette Eva M.D. on 11/25/2010 03:15:18 PM

## 2011-02-05 ENCOUNTER — Encounter: Payer: Self-pay | Admitting: Internal Medicine

## 2011-02-06 ENCOUNTER — Encounter: Payer: Self-pay | Admitting: Internal Medicine

## 2011-02-06 ENCOUNTER — Ambulatory Visit (INDEPENDENT_AMBULATORY_CARE_PROVIDER_SITE_OTHER): Payer: Medicare Other | Admitting: Internal Medicine

## 2011-02-06 VITALS — BP 153/73 | HR 73 | Temp 97.7°F | Ht 67.0 in | Wt 252.0 lb

## 2011-02-06 DIAGNOSIS — Z8719 Personal history of other diseases of the digestive system: Secondary | ICD-10-CM

## 2011-02-08 NOTE — Patient Instructions (Signed)
Recommendations: Followup on recent outside CBC.  Begin Metamucil 5 caplets, wafers or suspension every day.    Office visit with [place] in 3 months.

## 2011-02-08 NOTE — Progress Notes (Signed)
Primary Care Physician: Colette Ribas, MD  Primary Gastroenterologist:    Chief Complaint  Patient presents with  . Follow-up    doing ok  . Anemia    HPI: John [name] is a 77 y.o. male here 77 year old gentleman followup iron deficiency anemia secondary to GI bleed. Previously took nonsteroidals ( Naprosyn and Aleve). EGD, colonoscopy capsule study demonstrated gastric AVMs.  Hasn't seen any melena or hematochezia. Feels good. Reports having lab work done at [facility] about 3 weeks ago. History of small adenoma removed from his colon previously. He will be due for a colonoscopy in 5 years if his health otherwise remains reasonably good. He does complain of having trouble cleaning his perianal area after a bowel movement. Denies constipation or diarrhea at this point in time. He does not take a fiber supplement.   Current Outpatient Prescriptions  Medication Sig Dispense Refill  . amLODipine (NORVASC) 10 MG tablet Take 10 mg by mouth daily.        . Choline Fenofibrate (TRILIPIX) 135 MG capsule Take 135 mg by mouth daily.        . furosemide (LASIX) 40 MG tablet Take 20 mg by mouth daily.        . iron polysaccharides (NIFEREX) 150 MG capsule Take 150 mg by mouth 2 (two) times daily.        Marland Kitchen LANTUS SOLOSTAR 100 UNIT/ML injection       . Multiple Vitamins-Minerals (ONE-A-DAY 50 PLUS PO) Take 1 tablet by mouth daily.        . nebivolol (BYSTOLIC) 5 MG tablet Take 2.5 mg by mouth daily. Taking 7 mg daily      . pantoprazole (PROTONIX) 40 MG tablet Take 40 mg by mouth daily.        . Polyethylene Glycol 3350 POWD Take 17 g by mouth daily as needed.  527 g  5  . potassium chloride (K-DUR) 10 MEQ tablet Take 10 mEq by mouth daily.        . ramipril (ALTACE) 10 MG capsule Take 10 mg by mouth 3 (three) times daily.        . sitaGLIPtan (JANUVIA) 100 MG tablet Take 50 mg by mouth daily.        Marland Kitchen terazosin (HYTRIN) 5 MG capsule Take 5 mg by mouth at bedtime.        Marland Kitchen  ACCU-CHEK AVIVA PLUS test strip       . B-D ULTRAFINE III SHORT PEN 31G X 8 MM MISC         Allergies as of 02/06/2011 - Review Complete 02/06/2011  Allergen Reaction Noted  . Latex    . Povidone-iodine    . Propoxyphene hcl    . Sulfonamide derivatives      ROS:  General: Negative for anorexia, weight loss, fever, chills, fatigue, weakness. ENT: Negative for hoarseness, difficulty swallowing , nasal congestion. CV: Negative for chest pain, angina, palpitations, dyspnea on exertion, peripheral edema.  Respiratory: Negative for dyspnea at rest, dyspnea on exertion, cough, sputum, wheezing.  GI: See history of present illness. GU:  Negative for dysuria, hematuria, urinary incontinence, urinary frequency, nocturnal urination.  Endo: Negative for unusual weight change.    Physical Examination:   BP 153/73  Pulse 73  Temp(Src) 97.7 F (36.5 C) (Temporal)  Ht 5\' 7"  (1.702 m)  Wt 252 lb (114.306 kg)  BMI 39.47 kg/m2  General: Well-nourished, well-developed in no acute distress.  Eyes: No icterus. Mouth: Oropharyngeal mucosa moist  and pink , no lesions erythema or exudate. Lungs: Clear to auscultation bilaterally.  Heart: Regular rate and rhythm, no murmurs rubs or gallops.  Abdomen: Bowel sounds are normal, nontender, nondistended, no hepatosplenomegaly or masses, no abdominal bruits or hernia , no rebound or guarding.   Extremities: No lower extremity edema. No clubbing or deformities. Neuro: Alert and oriented x 4   Skin: Warm and dry, no jaundice.   Psych: Alert and cooperative, normal mood and affect.     Overall [name] is doing very well. He suffered recent NSAID-induced gastropathy and enteropathy. History of AVMs. He is no longer taking Naprosyn. He is doing very well. He will need a surveillance colonoscopy in 5 years. History of perianal hygiene issues. He would benefit from bowstringing the fiber intake in his diet.  Recommendations: Followup on recent outside  CBC.  Begin Metamucil 5 caplets, wafers or suspension every day.    Office visit with [place] in 3 months.  Labs:    Imaging Studies: No results found.

## 2011-02-13 ENCOUNTER — Encounter (HOSPITAL_COMMUNITY): Payer: Self-pay | Admitting: *Deleted

## 2011-02-13 ENCOUNTER — Other Ambulatory Visit: Payer: Self-pay

## 2011-02-13 ENCOUNTER — Emergency Department (HOSPITAL_COMMUNITY): Payer: Medicare Other

## 2011-02-13 ENCOUNTER — Inpatient Hospital Stay (HOSPITAL_COMMUNITY)
Admission: EM | Admit: 2011-02-13 | Discharge: 2011-02-21 | DRG: 690 | Disposition: A | Discharge status: Skilled Nursing Facility | Payer: Medicare Other | Attending: Internal Medicine | Admitting: Internal Medicine

## 2011-02-13 DIAGNOSIS — R5381 Other malaise: Secondary | ICD-10-CM | POA: Diagnosis present

## 2011-02-13 DIAGNOSIS — R531 Weakness: Secondary | ICD-10-CM | POA: Diagnosis present

## 2011-02-13 DIAGNOSIS — N183 Chronic kidney disease, stage 3 unspecified: Secondary | ICD-10-CM | POA: Diagnosis present

## 2011-02-13 DIAGNOSIS — Z8719 Personal history of other diseases of the digestive system: Secondary | ICD-10-CM | POA: Diagnosis present

## 2011-02-13 DIAGNOSIS — N12 Tubulo-interstitial nephritis, not specified as acute or chronic: Principal | ICD-10-CM | POA: Diagnosis present

## 2011-02-13 DIAGNOSIS — D649 Anemia, unspecified: Secondary | ICD-10-CM | POA: Diagnosis present

## 2011-02-13 DIAGNOSIS — B964 Proteus (mirabilis) (morganii) as the cause of diseases classified elsewhere: Secondary | ICD-10-CM | POA: Diagnosis present

## 2011-02-13 DIAGNOSIS — IMO0001 Reserved for inherently not codable concepts without codable children: Secondary | ICD-10-CM | POA: Diagnosis present

## 2011-02-13 DIAGNOSIS — I129 Hypertensive chronic kidney disease with stage 1 through stage 4 chronic kidney disease, or unspecified chronic kidney disease: Secondary | ICD-10-CM | POA: Diagnosis present

## 2011-02-13 DIAGNOSIS — N390 Urinary tract infection, site not specified: Secondary | ICD-10-CM

## 2011-02-13 DIAGNOSIS — N400 Enlarged prostate without lower urinary tract symptoms: Secondary | ICD-10-CM | POA: Diagnosis present

## 2011-02-13 DIAGNOSIS — E669 Obesity, unspecified: Secondary | ICD-10-CM | POA: Diagnosis present

## 2011-02-13 DIAGNOSIS — I10 Essential (primary) hypertension: Secondary | ICD-10-CM | POA: Diagnosis present

## 2011-02-13 DIAGNOSIS — W19XXXA Unspecified fall, initial encounter: Secondary | ICD-10-CM | POA: Diagnosis present

## 2011-02-13 DIAGNOSIS — E1169 Type 2 diabetes mellitus with other specified complication: Secondary | ICD-10-CM | POA: Diagnosis present

## 2011-02-13 DIAGNOSIS — N184 Chronic kidney disease, stage 4 (severe): Secondary | ICD-10-CM | POA: Diagnosis present

## 2011-02-13 DIAGNOSIS — E876 Hypokalemia: Secondary | ICD-10-CM | POA: Diagnosis not present

## 2011-02-13 DIAGNOSIS — Z9181 History of falling: Secondary | ICD-10-CM

## 2011-02-13 DIAGNOSIS — K219 Gastro-esophageal reflux disease without esophagitis: Secondary | ICD-10-CM | POA: Diagnosis present

## 2011-02-13 DIAGNOSIS — G4733 Obstructive sleep apnea (adult) (pediatric): Secondary | ICD-10-CM | POA: Diagnosis present

## 2011-02-13 LAB — COMPREHENSIVE METABOLIC PANEL
AST: 18 U/L (ref 0–37)
Albumin: 3.2 g/dL — ABNORMAL LOW (ref 3.5–5.2)
Calcium: 9.6 mg/dL (ref 8.4–10.5)
Creatinine, Ser: 1.86 mg/dL — ABNORMAL HIGH (ref 0.50–1.35)
Total Protein: 6.3 g/dL (ref 6.0–8.3)

## 2011-02-13 LAB — URINALYSIS, ROUTINE W REFLEX MICROSCOPIC
Glucose, UA: 500 mg/dL — AB
Ketones, ur: NEGATIVE mg/dL
Protein, ur: 100 mg/dL — AB

## 2011-02-13 LAB — GLUCOSE, CAPILLARY
Glucose-Capillary: 249 mg/dL — ABNORMAL HIGH (ref 70–99)
Glucose-Capillary: 283 mg/dL — ABNORMAL HIGH (ref 70–99)
Glucose-Capillary: 243 mg/dL — ABNORMAL HIGH (ref 70–99)

## 2011-02-13 LAB — CBC
MCH: 25.8 pg — ABNORMAL LOW (ref 26.0–34.0)
MCV: 79.9 fL (ref 78.0–100.0)
Platelets: 273 10*3/uL (ref 150–400)
RDW: 16.9 % — ABNORMAL HIGH (ref 11.5–15.5)
WBC: 20.7 10*3/uL — ABNORMAL HIGH (ref 4.0–10.5)

## 2011-02-13 LAB — URINE MICROSCOPIC-ADD ON

## 2011-02-13 LAB — POCT I-STAT TROPONIN I

## 2011-02-13 MED ORDER — INSULIN ASPART 100 UNIT/ML ~~LOC~~ SOLN
0.0000 [IU] | Freq: Three times a day (TID) | SUBCUTANEOUS | Status: DC
  Administered 2011-02-14 (×2): 5 [IU] via SUBCUTANEOUS

## 2011-02-13 MED ORDER — SENNA 8.6 MG PO TABS: 2.0000 | ORAL_TABLET | Freq: Every day | ORAL | Status: DC | PRN

## 2011-02-13 MED ORDER — ACETAMINOPHEN 650 MG RE SUPP: 650.0000 mg | Freq: Four times a day (QID) | RECTAL | Status: DC | PRN

## 2011-02-13 MED ORDER — SODIUM CHLORIDE 0.9 % IV BOLUS (SEPSIS)
500.0000 mL | Freq: Once | INTRAVENOUS | Status: AC
  Administered 2011-02-13: 500 mL via INTRAVENOUS

## 2011-02-13 MED ORDER — TRAZODONE HCL 50 MG PO TABS
50.0000 mg | ORAL_TABLET | Freq: Every day | ORAL | Status: DC
  Administered 2011-02-13 – 2011-02-20 (×8): 50 mg via ORAL
  Filled 2011-02-13 (×8): qty 1

## 2011-02-13 MED ORDER — DEXTROSE 5 % IV SOLN
1.0000 g | Freq: Once | INTRAVENOUS | Status: AC
  Administered 2011-02-13: 1 g via INTRAVENOUS
  Filled 2011-02-13: qty 1

## 2011-02-13 MED ORDER — LINAGLIPTIN 5 MG PO TABS
5.0000 mg | ORAL_TABLET | Freq: Every day | ORAL | Status: DC
  Administered 2011-02-13 – 2011-02-21 (×9): 5 mg via ORAL
  Filled 2011-02-13 (×11): qty 1

## 2011-02-13 MED ORDER — DOCUSATE SODIUM 100 MG PO CAPS
100.0000 mg | ORAL_CAPSULE | Freq: Two times a day (BID) | ORAL | Status: DC
  Administered 2011-02-13 – 2011-02-21 (×14): 100 mg via ORAL
  Filled 2011-02-13 (×14): qty 1

## 2011-02-13 MED ORDER — ONDANSETRON HCL 4 MG/2ML IJ SOLN
4.0000 mg | Freq: Four times a day (QID) | INTRAMUSCULAR | Status: DC | PRN
  Administered 2011-02-15 – 2011-02-19 (×2): 4 mg via INTRAVENOUS
  Filled 2011-02-13 (×2): qty 2

## 2011-02-13 MED ORDER — ACETAMINOPHEN 325 MG PO TABS
650.0000 mg | ORAL_TABLET | Freq: Four times a day (QID) | ORAL | Status: DC | PRN
  Administered 2011-02-13 – 2011-02-17 (×4): 650 mg via ORAL
  Filled 2011-02-13 (×4): qty 2

## 2011-02-13 MED ORDER — MORPHINE SULFATE 2 MG/ML IJ SOLN: 2.0000 mg | INTRAMUSCULAR | Status: DC | PRN

## 2011-02-13 MED ORDER — TERAZOSIN HCL 5 MG PO CAPS
10.0000 mg | ORAL_CAPSULE | Freq: Two times a day (BID) | ORAL | Status: DC
  Administered 2011-02-13 – 2011-02-21 (×17): 10 mg via ORAL
  Filled 2011-02-13 (×9): qty 2
  Filled 2011-02-13: qty 1
  Filled 2011-02-13 (×7): qty 2

## 2011-02-13 MED ORDER — INSULIN ASPART 100 UNIT/ML ~~LOC~~ SOLN
0.0000 [IU] | SUBCUTANEOUS | Status: DC
  Administered 2011-02-13: 8 [IU] via SUBCUTANEOUS
  Administered 2011-02-13: 5 [IU] via SUBCUTANEOUS
  Filled 2011-02-13: qty 3

## 2011-02-13 MED ORDER — DEXTROSE 5 % IV SOLN
1.0000 g | INTRAVENOUS | Status: DC
  Filled 2011-02-13 (×4): qty 1

## 2011-02-13 MED ORDER — HYDROCODONE-ACETAMINOPHEN 5-325 MG PO TABS
1.0000 | ORAL_TABLET | ORAL | Status: DC | PRN
  Administered 2011-02-13 – 2011-02-20 (×3): 1 via ORAL
  Filled 2011-02-13 (×3): qty 1

## 2011-02-13 MED ORDER — ALUM & MAG HYDROXIDE-SIMETH 200-200-20 MG/5ML PO SUSP: 30.0000 mL | Freq: Four times a day (QID) | ORAL | Status: DC | PRN

## 2011-02-13 MED ORDER — PANTOPRAZOLE SODIUM 40 MG PO TBEC
40.0000 mg | DELAYED_RELEASE_TABLET | Freq: Every day | ORAL | Status: DC
  Administered 2011-02-13 – 2011-02-21 (×9): 40 mg via ORAL
  Filled 2011-02-13 (×9): qty 1

## 2011-02-13 MED ORDER — PHENAZOPYRIDINE HCL 100 MG PO TABS
100.0000 mg | ORAL_TABLET | Freq: Three times a day (TID) | ORAL | Status: AC
  Administered 2011-02-13 – 2011-02-15 (×7): 100 mg via ORAL
  Filled 2011-02-13 (×6): qty 1

## 2011-02-13 MED ORDER — INSULIN GLARGINE 100 UNIT/ML ~~LOC~~ SOLN
40.0000 [IU] | Freq: Every day | SUBCUTANEOUS | Status: DC
  Administered 2011-02-13: 40 [IU] via SUBCUTANEOUS
  Filled 2011-02-13: qty 3

## 2011-02-13 MED ORDER — INSULIN GLARGINE 100 UNIT/ML ~~LOC~~ SOLN: 20.0000 [IU] | Freq: Every day | SUBCUTANEOUS | Status: DC

## 2011-02-13 MED ORDER — DEXTROSE 5 % IV SOLN
1.0000 g | INTRAVENOUS | Status: DC
  Administered 2011-02-14 – 2011-02-15 (×2): 1 g via INTRAVENOUS

## 2011-02-13 MED ORDER — ONDANSETRON HCL 4 MG PO TABS: 4.0000 mg | ORAL_TABLET | Freq: Four times a day (QID) | ORAL | Status: DC | PRN

## 2011-02-13 MED ORDER — SODIUM CHLORIDE 0.9 % IV SOLN
INTRAVENOUS | Status: DC
  Administered 2011-02-13 – 2011-02-14 (×2): via INTRAVENOUS

## 2011-02-13 NOTE — ED Notes (Signed)
Patient left ED via stretcher with ED tech in good condition. NAD noted. Report given to RN on unit 300, ready to accept patient.

## 2011-02-13 NOTE — ED Notes (Signed)
Patient is resting comfortably with eyes closed. 

## 2011-02-13 NOTE — ED Notes (Signed)
Attempted to call report. Alcario Drought stating she needed to call back in a few minutes.

## 2011-02-13 NOTE — ED Notes (Signed)
Attempted to call report. Jessica in room with patient. Will call back in 5 minutes.

## 2011-02-13 NOTE — H&P (Signed)
Hospital Admission Note Date: 02/13/2011  Patient name: John [name] Medical record number: [id number] Date of birth: May 23, 1931 Age: 77 y.o. Gender: male PCP: Colette Ribas, MD  Attending physician: Christiane Ha  Chief Complaint: Weakness, vomiting  History of Present Illness: The patient is a 77 year old white male with multiple medical problems who presents to the emergency room via EMS with nausea, vomiting, dysuria, back pain and weakness starting yesterday. He fell several times and had a difficult time getting up. He did not injure himself. He has a history of BPH and apparently has surgery scheduled For with Dr. Patsi Sears. He's had no hematuria. The back pain is in the bilateral flank area and started sometime last week but was much worse yesterday. He has chronic urinary incontinence. He vomited several times yesterday. His nausea is somewhat improved today, and he has eaten a bit of lunch. He lives alone. In the emergency room, he was noted to be mildly tachycardic and had evidence of urinary tract infection. He was given IV fluid and IV ceftriaxone.  Meds: Prescriptions prior to admission  Medication Sig Dispense Refill  . amLODipine (NORVASC) 10 MG tablet Take 5 mg by mouth daily.       . Choline Fenofibrate (TRILIPIX) 135 MG capsule Take 135 mg by mouth daily.        . furosemide (LASIX) 40 MG tablet Take 20 mg by mouth daily.        . iron polysaccharides (NIFEREX) 150 MG capsule Take 150 mg by mouth daily.       Marland Kitchen LANTUS SOLOSTAR 100 UNIT/ML injection Inject 50 Units into the skin daily.       . Multiple Vitamins-Minerals (ONE-A-DAY 50 PLUS PO) Take 1 tablet by mouth daily.        . nebivolol (BYSTOLIC) 5 MG tablet Take 2.5 mg by mouth daily. Taking 7 mg daily      . pantoprazole (PROTONIX) 40 MG tablet Take 40 mg by mouth daily.        . potassium chloride (K-DUR) 10 MEQ tablet Take 10 mEq by mouth daily.        . ramipril (ALTACE) 10 MG capsule Take 10 mg by  mouth daily.       . sitaGLIPtan (JANUVIA) 100 MG tablet Take 50 mg by mouth daily.        Marland Kitchen terazosin (HYTRIN) 5 MG capsule Take 10 mg by mouth 2 (two) times daily.        Allergies: Betadine; Latex; Povidone-iodine; Propoxyphene hcl; and Sulfonamide derivatives Past Medical History  Diagnosis Date  . GI bleed     2006, TCS/TI showed small polyp. EGD showed erosive antral gastritis with two polyp, one oozing and tx. HP neg. SB capsule shoed few jejunal ulcers with bleeding (naproxen and ASA)  . GI bleed     02/2010, EGD->pancreatitc rest, fundal gland polyps,  no bleeding. TCS->blood-tinged colonic effluent throughout the colon without bleeding lesion found, nl TI. SB capsule->SB erosions, nonbleeding, incomplete study. Patient on naproxyn.   . DM (diabetes mellitus)   . Pneumonia   . Hyperlipidemia   . HTN (hypertension)   . Stroke   . GERD (gastroesophageal reflux disease)   . Poor vision     left eye  . Nephrolithiasis   . Sleep apnea   . Obesity   . Ischemic colitis, enteritis, or enterocolitis     flex sig 01/2010  . Chronic renal insufficiency   . Gastric AVM 10/24/10  GI bleed EGD w/ bicap and hemoclip placement, 2 AVMs, presented w/bright red rectal bleeding  . GI bleed 10/27/10    ileocolonoscopy/GIVENS capsule study by Dr Rourk->bloody colonic effluent throughout colon but no lesion identified, TA @ hepatic flexure, fresh blood in dital SB on capsule  . Tubular adenoma of colon 10/27/10    on colonoscopy Dr Jena Gauss   Past Surgical History  Procedure Date  . Back surgery   . Transurethral resection of prostate   . Cataract extraction     bilateral  . Appendectomy   . Cholecystectomy   . Elbow surgery   . Esophagogastroduodenoscopy 10/23/10    GI bleed- capsule   Family History  Problem Relation Age of Onset  . Aneurysm Daughter     brain, deceased age 10  . GI problems Brother     gi bleed  . Cancer Sister     unknown type  . Colon cancer Neg Hx   . Liver  disease Neg Hx    History   Social History  . Marital Status: Widowed    Spouse Name: N/A    Number of Children: 2  . Years of Education: N/A   Occupational History  . retired     [employer]   Social History Main Topics  . Smoking status: Never Smoker   . Smokeless tobacco: Never Used  . Alcohol Use: No  . Drug Use: No  . Sexually Active: No   Other Topics Concern  . Not on file   Social History Narrative  . No narrative on file   Review of Systems: Reviewed, and as per history of present illness otherwise negative. Physical Exam: Blood pressure 150/65, pulse 110, temperature 99.1 F (37.3 C), temperature source Oral, resp. rate 24, height 5\' 7"  (1.702 m), weight 112.81 kg (248 lb 11.2 oz), SpO2 93.00%.  BP 150/65  Pulse 110  Temp(Src) 99.1 F (37.3 C) (Oral)  Resp 24  Ht 5\' 7"  (1.702 m)  Wt 112.81 kg (248 lb 11.2 oz)  BMI 38.95 kg/m2  SpO2 93%  General Appearance:    groggy, but oriented and appropriate. Morbidly obese   Head:    Normocephalic, without obvious abnormality, atraumatic  Eyes:    PERRL, conjunctiva/corneas clear, EOM's intact, fundi    benign, both eyes. He keeps his eyes closed for most of the examination and history.          Nose:   Nares normal, septum midline, mucosa normal, no drainage   or sinus tenderness  Throat:   Lips, mucosa, and tongue normal; teeth and gums normal  Neck:   Supple, symmetrical, trachea midline, no adenopathy;       thyroid:  No enlargement/tenderness/nodules; no carotid   bruit or JVD  Back:     difficult to examine due to weakness. He is unable to sit up or roll over at this time.   Lungs:     Clear to auscultation bilaterally, respirations unlabored  Chest wall:    No tenderness or deformity  Heart:    Regular rate and rhythm, S1 and S2 normal, no murmur, rub   or gallop  Abdomen:     Soft, non-tender, bowel sounds active all four quadrants,    no masses, no organomegaly morbidly obese.   Genitalia:   deferred    Rectal:   deferred   Extremities:   Extremities normal, atraumatic, no cyanosis or 1+ pitting edema bilaterally.   Pulses:   2+ and symmetric all extremities  Skin:   Skin color, texture, turgor normal, no rashes or lesions  Lymph nodes:   Cervical, supraclavicular, and axillary nodes normal  Neurologic:   CNII-XII intact. Normal strength, sensation and reflexes      throughout   Lab results: Basic Metabolic Panel:  Basename 02/13/11 0512  NA 135  K 3.7  CL 98  CO2 22  GLUCOSE 251*  BUN 22  CREATININE 1.86*  CALCIUM 9.6  MG --  PHOS --   Liver Function Tests:  Basename 02/13/11 0512  AST 18  ALT 19  ALKPHOS 55  BILITOT 0.3  PROT 6.3  ALBUMIN 3.2*   No results found for this basename: LIPASE:2,AMYLASE:2 in the last 72 hours CBC:  Basename 02/13/11 0512  WBC 20.7*  NEUTROABS --  HGB 11.7*  HCT 36.2*  MCV 79.9  PLT 273   Cardiac Enzymes: No results found for this basename: CKTOTAL:3,CKMB:3,CKMBINDEX:3,TROPONINI:3 in the last 72 hours BNP: No results found for this basename: POCBNP:3 in the last 72 hours D-Dimer: No results found for this basename: DDIMER:2 in the last 72 hours CBG:  Basename 02/13/11 1152  GLUCAP 249*   Hemoglobin A1C: No results found for this basename: HGBA1C in the last 72 hours Fasting Lipid Panel: No results found for this basename: CHOL,HDL,LDLCALC,TRIG,CHOLHDL,LDLDIRECT in the last 72 hours Thyroid Function Tests: No results found for this basename: TSH,T4TOTAL,FREET4,T3FREE,THYROIDAB in the last 72 hours Anemia Panel: No results found for this basename: VITAMINB12,FOLATE,FERRITIN,TIBC,IRON,RETICCTPCT in the last 72 hours  Results for TIBERIUS, LOFTUS (MRN [id number]) as of 02/13/2011 14:13  Ref. Range 02/13/2011 05:29  Color, Urine Latest Range: YELLOW  GREEN (A)  Appearance Latest Range: CLEAR  HAZY (A)  Specific Gravity, Urine Latest Range: 1.005-1.030  1.020  pH Latest Range: 5.0-8.0  6.5  Glucose, UA Latest Range: NEGATIVE  mg/dL 409 (A)  Bilirubin Urine Latest Range: NEGATIVE  SMALL (A)  Ketones, ur Latest Range: NEGATIVE mg/dL NEGATIVE  Protein Latest Range: NEGATIVE mg/dL 811 (A)  Urobilinogen, UA Latest Range: 0.0-1.0 mg/dL 0.2  Nitrite Latest Range: NEGATIVE  NEGATIVE  Leukocytes, UA Latest Range: NEGATIVE  NEGATIVE  WBC, UA Latest Range: <3 WBC/hpf 21-50  RBC / HPF Latest Range: <3 RBC/hpf 3-6  Squamous Epithelial / LPF Latest Range: RARE  RARE  Bacteria, UA Latest Range: RARE  MANY (A)  URINE CULTURE No range found Rpt  Hgb urine dipstick Latest Range: NEGATIVE  SMALL (A)   Imaging results:  Dg Chest Portable 1 View  02/13/2011  *RADIOLOGY REPORT*  Clinical Data: Fever and chest pain.  PORTABLE CHEST - 1 VIEW  Comparison: 10/23/2010  Findings: Shallow inspiration.  Normal heart size and pulmonary vascularity.  No focal airspace consolidation.  No blunting of costophrenic angles.  No pneumothorax.  Stable appearance since previous study.  IMPRESSION: No evidence of active pulmonary disease.  Original Report Authenticated By: Marlon Pel, M.D.    Assessment & Plan by Problem: Patient Active Hospital Problem List: Pyelonephritis (02/13/2011): Urine culture is pending. He is quite weak and will be admitted. Continue ceftriaxone. He is also having bothersome dysuria and will be started on Pyridium.  DM (02/27/2010): Poorly controlled. Continue Januvia and Lantus at a lower dose. Give sliding scale. Monitor blood glucose. Check hemoglobin A1c.  HYPERTENSION (02/27/2010): Continue antihypertensives.  Weakness generalized (02/13/2011): Secondary to infection. He will get IV fluids he will be on f fall precautions.  HYPERLIPIDEMIA (02/27/2010) EXOGENOUS OBESITY (02/27/2010) ANEMIA (04/25/2010) improved from previous  GERD (02/27/2010) GASTROINTESTINAL HEMORRHAGE, HX OF (  02/27/2010): He has a history of recurrent bleed from AV malformations. No heparin products do to risk of bleed. He will get sequential compression  devices for DVT prophylaxis.  Fall (02/13/2011) CKD (chronic kidney disease), stage III (02/13/2011): Creatinine is at about baseline.  BPH (benign prostatic hypertrophy) (02/13/2011)    Jhamal Plucinski L 02/13/2011, 1:46 PM

## 2011-02-13 NOTE — ED Provider Notes (Signed)
History     CSN: [id number] Arrival date & time: 02/13/2011  4:56 AM  Chief Complaint  Patient presents with  . Weakness   Patient is a 77 y.o. male presenting with extremity weakness. The history is provided by the patient.  Extremity Weakness This is a new problem. The current episode started yesterday. The problem occurs constantly. The problem has not changed since onset.Pertinent negatives include no abdominal pain. The symptoms are aggravated by walking. The symptoms are relieved by nothing. The treatment provided no relief.  PT relates he had nausea all day. No vomiting, no abd pain, cough, diarrhea, sore throat. Has some chills and ? Fever.  Has urinary frequency, no dysuria but he states he has burning, has urinary incontinance.  Was seen by Dr Patsi Sears this week and he is going to have prostate surgery in September. No one else sick. Has felt this way before. Fell twice yesterday b/o feeling so weak his legs buckled. Denies injury from the fall.   Past Medical History  Diagnosis Date  . GI bleed     2006, TCS/TI showed small polyp. EGD showed erosive antral gastritis with two polyp, one oozing and tx. HP neg. SB capsule shoed few jejunal ulcers with bleeding (naproxen and ASA)  . GI bleed     02/2010, EGD->pancreatitc rest, fundal gland polyps,  no bleeding. TCS->blood-tinged colonic effluent throughout the colon without bleeding lesion found, nl TI. SB capsule->SB erosions, nonbleeding, incomplete study. Patient on naproxyn.   . DM (diabetes mellitus)   . Pneumonia   . Hyperlipidemia   . HTN (hypertension)   . Stroke   . GERD (gastroesophageal reflux disease)   . Poor vision     left eye  . Nephrolithiasis   . Sleep apnea   . Obesity   . Ischemic colitis, enteritis, or enterocolitis     flex sig 01/2010  . Chronic renal insufficiency   . Gastric AVM 10/24/10    GI bleed EGD w/ bicap and hemoclip placement, 2 AVMs, presented w/bright red rectal bleeding  . GI bleed 10/27/10     ileocolonoscopy/GIVENS capsule study by Dr Rourk->bloody colonic effluent throughout colon but no lesion identified, TA @ hepatic flexure, fresh blood in dital SB on capsule  . Tubular adenoma of colon 10/27/10    on colonoscopy Dr Jena Gauss    Past Surgical History  Procedure Date  . Back surgery   . Transurethral resection of prostate   . Cataract extraction     bilateral  . Appendectomy   . Cholecystectomy   . Elbow surgery   . Esophagogastroduodenoscopy 10/23/10    GI bleed- capsule    Family History  Problem Relation Age of Onset  . Aneurysm Daughter     brain, deceased age 47  . GI problems Brother     gi bleed  . Cancer Sister     unknown type  . Colon cancer Neg Hx   . Liver disease Neg Hx     History  Substance Use Topics  . Smoking status: Never Smoker   . Smokeless tobacco: Never Used  . Alcohol Use: No      Review of Systems  Gastrointestinal: Negative for abdominal pain.  Musculoskeletal: Positive for extremity weakness.  All other systems reviewed and are negative.    Physical Exam  BP 143/65  Pulse 108  Temp(Src) 97.7 F (36.5 C) (Oral)  Resp 20  Ht 5\' 7"  (1.702 m)  Wt 250 lb (113.399 kg)  BMI 39.16  kg/m2  SpO2 93%  Physical Exam  Vitals reviewed. Constitutional: He is oriented to person, place, and time. He appears well-developed and well-nourished.  HENT:  Head: Normocephalic and atraumatic.  Eyes: Conjunctivae and EOM are normal. Pupils are equal, round, and reactive to light.  Neck: Normal range of motion. Neck supple.  Cardiovascular: Normal rate, regular rhythm and normal heart sounds.   Pulmonary/Chest: Effort normal and breath sounds normal.  Abdominal: Soft. Bowel sounds are normal.  Neurological: He is alert and oriented to person, place, and time.  Skin: Skin is warm. No rash noted. No erythema. There is pallor.  Psychiatric: He has a normal mood and affect.    ED Course  Procedures  MDM  Date: 02/13/2011  Rate:106   Rhythm: sinus tachycardia  QRS Axis: left  Intervals: normal  ST/T Wave abnormalities: nonspecific ST/T changes  Conduction Disutrbances:right bundle branch block  Narrative Interpretation:   Old EKG Reviewed: unchanged from 10/24/2010 except for rate   Results for orders placed during the hospital encounter of 02/13/11  CBC      Component Value Range   WBC 20.7 (*) 4.0 - 10.5 (K/uL)   RBC 4.53  4.22 - 5.81 (MIL/uL)   Hemoglobin 11.7 (*) 13.0 - 17.0 (g/dL)   HCT 40.9 (*) 81.1 - 52.0 (%)   MCV 79.9  78.0 - 100.0 (fL)   MCH 25.8 (*) 26.0 - 34.0 (pg)   MCHC 32.3  30.0 - 36.0 (g/dL)   RDW 91.4 (*) 78.2 - 15.5 (%)   Platelets 273  150 - 400 (K/uL)  COMPREHENSIVE METABOLIC PANEL      Component Value Range   Sodium 135  135 - 145 (mEq/L)   Potassium 3.7  3.5 - 5.1 (mEq/L)   Chloride 98  96 - 112 (mEq/L)   CO2 22  19 - 32 (mEq/L)   Glucose, Bld 251 (*) 70 - 99 (mg/dL)   BUN 22  6 - 23 (mg/dL)   Creatinine, Ser 9.56 (*) 0.50 - 1.35 (mg/dL)   Calcium 9.6  8.4 - 21.3 (mg/dL)   Total Protein 6.3  6.0 - 8.3 (g/dL)   Albumin 3.2 (*) 3.5 - 5.2 (g/dL)   AST 18  0 - 37 (U/L)   ALT 19  0 - 53 (U/L)   Alkaline Phosphatase 55  39 - 117 (U/L)   Total Bilirubin 0.3  0.3 - 1.2 (mg/dL)   GFR calc non Af Amer 35 (*) >60 (mL/min)   GFR calc Af Amer 43 (*) >60 (mL/min)  CULTURE, BLOOD (ROUTINE X 2)      Component Value Range   Specimen Description Blood RIGHT ARM     Special Requests       Value: BOTTLES DRAWN AEROBIC AND ANAEROBIC 8 CC EACH BOTTLE   Culture PENDING     Report Status PENDING    CULTURE, BLOOD (ROUTINE X 2)      Component Value Range   Specimen Description Blood RIGHT ARM     Special Requests BOTTLES DRAWN AEROBIC AND ANAEROBIC 8 CC EACH     Culture PENDING     Report Status PENDING    URINALYSIS, ROUTINE W REFLEX MICROSCOPIC      Component Value Range   Color, Urine GREEN (*) YELLOW    Appearance HAZY (*) CLEAR    Specific Gravity, Urine 1.020  1.005 - 1.030    pH 6.5   5.0 - 8.0    Glucose, UA 500 (*) NEGATIVE (mg/dL)  Hgb urine dipstick SMALL (*) NEGATIVE    Bilirubin Urine SMALL (*) NEGATIVE    Ketones, ur NEGATIVE  NEGATIVE (mg/dL)   Protein, ur 161 (*) NEGATIVE (mg/dL)   Urobilinogen, UA 0.2  0.0 - 1.0 (mg/dL)   Nitrite NEGATIVE  NEGATIVE    Leukocytes, UA NEGATIVE  NEGATIVE   POCT I-STAT TROPONIN I      Component Value Range   Troponin i, poc 0.02  0.00 - 0.08 (ng/mL)   Comment 3           URINE MICROSCOPIC-ADD ON      Component Value Range   Squamous Epithelial / LPF RARE  RARE    WBC, UA 21-50  <3 (WBC/hpf)   RBC / HPF 3-6  <3 (RBC/hpf)   Bacteria, UA MANY (*) RARE    Dg Chest Portable 1 View  02/13/2011  *RADIOLOGY REPORT*  Clinical Data: Fever and chest pain.  PORTABLE CHEST - 1 VIEW  Comparison: 10/23/2010  Findings: Shallow inspiration.  Normal heart size and pulmonary vascularity.  No focal airspace consolidation.  No blunting of costophrenic angles.  No pneumothorax.  Stable appearance since previous study.  IMPRESSION: No evidence of active pulmonary disease.  Original Report Authenticated By: Marlon Pel, M.D.   Pt given IV fluids. He was started on IV antibiotics for his UTI.   Ward Givens, MD 02/13/11 [phone]

## 2011-02-13 NOTE — ED Notes (Signed)
Pt states has been sob for awhile. Wheezing noted.

## 2011-02-13 NOTE — ED Notes (Signed)
ems reports general weakness & pt states he has been falling some, feels like legs are giving out.

## 2011-02-14 LAB — GLUCOSE, CAPILLARY
Glucose-Capillary: 229 mg/dL — ABNORMAL HIGH (ref 70–99)
Glucose-Capillary: 196 mg/dL — ABNORMAL HIGH (ref 70–99)

## 2011-02-14 LAB — CBC
Hemoglobin: 10.7 g/dL — ABNORMAL LOW (ref 13.0–17.0)
MCH: 25.8 pg — ABNORMAL LOW (ref 26.0–34.0)
MCHC: 31.8 g/dL (ref 30.0–36.0)

## 2011-02-14 LAB — BASIC METABOLIC PANEL
BUN: 23 mg/dL (ref 6–23)
Calcium: 9 mg/dL (ref 8.4–10.5)
GFR calc non Af Amer: 30 mL/min — ABNORMAL LOW (ref >60)
Glucose, Bld: 206 mg/dL — ABNORMAL HIGH (ref 70–99)
Sodium: 138 mEq/L (ref 135–145)

## 2011-02-14 MED ORDER — INSULIN ASPART 100 UNIT/ML ~~LOC~~ SOLN
0.0000 [IU] | Freq: Three times a day (TID) | SUBCUTANEOUS | Status: DC
  Administered 2011-02-15 – 2011-02-16 (×5): 4 [IU] via SUBCUTANEOUS
  Administered 2011-02-16: 3 [IU] via SUBCUTANEOUS
  Administered 2011-02-17: 7 [IU] via SUBCUTANEOUS
  Administered 2011-02-17 (×2): 4 [IU] via SUBCUTANEOUS
  Administered 2011-02-18: 3 [IU] via SUBCUTANEOUS
  Administered 2011-02-18 (×2): 4 [IU] via SUBCUTANEOUS
  Administered 2011-02-19: 3 [IU] via SUBCUTANEOUS
  Administered 2011-02-19: 4 [IU] via SUBCUTANEOUS
  Administered 2011-02-20: 1 [IU] via SUBCUTANEOUS
  Administered 2011-02-20: 0 [IU] via SUBCUTANEOUS
  Administered 2011-02-20: 7 [IU] via SUBCUTANEOUS
  Administered 2011-02-21: 4 [IU] via SUBCUTANEOUS
  Administered 2011-02-21: 3 [IU] via SUBCUTANEOUS

## 2011-02-14 MED ORDER — INSULIN ASPART 100 UNIT/ML ~~LOC~~ SOLN
0.0000 [IU] | SUBCUTANEOUS | Status: DC
  Administered 2011-02-14: 7 [IU] via SUBCUTANEOUS

## 2011-02-14 MED ORDER — INSULIN ASPART 100 UNIT/ML ~~LOC~~ SOLN
0.0000 [IU] | Freq: Every day | SUBCUTANEOUS | Status: DC
  Administered 2011-02-18: 0 [IU] via SUBCUTANEOUS

## 2011-02-14 MED ORDER — INSULIN GLARGINE 100 UNIT/ML ~~LOC~~ SOLN
50.0000 [IU] | Freq: Every day | SUBCUTANEOUS | Status: DC
  Administered 2011-02-14: 50 [IU] via SUBCUTANEOUS

## 2011-02-14 MED ORDER — INSULIN ASPART 100 UNIT/ML ~~LOC~~ SOLN
4.0000 [IU] | Freq: Three times a day (TID) | SUBCUTANEOUS | Status: DC
  Administered 2011-02-15 – 2011-02-16 (×5): 4 [IU] via SUBCUTANEOUS

## 2011-02-14 NOTE — Progress Notes (Signed)
Subjective: Feels a little better, but still very weak. His dysuria and flank pain is improved today. He has had no vomiting. He still feels hot and cold. Objective: Vital signs in last 24 hours: Filed Vitals:   02/13/11 2141 02/14/11 0600 02/14/11 0800 02/14/11 0810  BP: 137/76 157/69    Pulse: 95 100    Temp: 98.5 F (36.9 C) 98.5 F (36.9 C)    TempSrc: Oral Oral    Resp: 20 20    Height:      Weight:      SpO2: 91% 92% 86% 91%   Weight change: -0.59 kg (-1 lb 4.8 oz)  Intake/Output Summary (Last 24 hours) at 02/14/11 1245 Last data filed at 02/14/11 1000  Gross per 24 hour  Intake   1295 ml  Output      0 ml  Net   1295 ml   Physical examination: Unchanged from 02/13/11  Lab Results: Basic Metabolic Panel:  Lab 02/14/11 1478 02/13/11 0512  NA 138 135  K 3.3* 3.7  CL 102 98  CO2 28 22  GLUCOSE 206* 251*  BUN 23 22  CREATININE 2.15* 1.86*  CALCIUM 9.0 9.6  MG -- --  PHOS -- --   Liver Function Tests:  Lab 02/13/11 0512  AST 18  ALT 19  ALKPHOS 55  BILITOT 0.3  PROT 6.3  ALBUMIN 3.2*   No results found for this basename: LIPASE:2,AMYLASE:2 in the last 168 hours CBC:  Lab 02/14/11 0524 02/13/11 0512  WBC 23.5* 20.7*  NEUTROABS -- --  HGB 10.7* 11.7*  HCT 33.7* 36.2*  MCV 81.4 79.9  PLT 251 273   Cardiac Enzymes: No results found for this basename: CKTOTAL:3,CKMB:3,CKMBINDEX:3,TROPONINI:3 in the last 168 hours BNP: No results found for this basename: POCBNP:3 in the last 168 hours D-Dimer: No results found for this basename: DDIMER:2 in the last 168 hours CBG:  Lab 02/14/11 1109 02/14/11 0736 02/13/11 2048 02/13/11 1659 02/13/11 1152  GLUCAP 243* 201* 243* 283* 249*   Hemoglobin A1C: No results found for this basename: HGBA1C in the last 168 hours Fasting Lipid Panel: No results found for this basename: CHOL,HDL,LDLCALC,TRIG,CHOLHDL,LDLDIRECT in the last 295 hours Thyroid Function Tests: No results found for this basename:  TSH,T4TOTAL,FREET4,T3FREE,THYROIDAB in the last 168 hours Anemia Panel: No results found for this basename: VITAMINB12,FOLATE,FERRITIN,TIBC,IRON,RETICCTPCT in the last 168 hours   Micro Results: Recent Results (from the past 240 hour(s))  URINE CULTURE     Status: Normal (Preliminary result)   Collection Time   02/13/11  5:29 AM      Component Value Range Status Comment   Specimen Description URINE, CLEAN CATCH   Final    Special Requests none Normal   Final    Setup Time [id number]   Final    Colony Count >=100,000 COLONIES/ML   Final    Culture PROTEUS SPECIES   Final    Report Status PENDING   Incomplete   CULTURE, BLOOD (ROUTINE X 2)     Status: Normal (Preliminary result)   Collection Time   02/13/11  5:55 AM      Component Value Range Status Comment   Specimen Description Blood RIGHT ARM   Final    Special Requests     Final    Value: BOTTLES DRAWN AEROBIC AND ANAEROBIC 8 CC EACH BOTTLE   Culture NO GROWTH 1 DAY   Final    Report Status PENDING   Incomplete   CULTURE, BLOOD (ROUTINE X 2)  Status: Normal (Preliminary result)   Collection Time   02/13/11  5:55 AM      Component Value Range Status Comment   Specimen Description Blood RIGHT ARM   Final    Special Requests BOTTLES DRAWN AEROBIC AND ANAEROBIC 8 CC EACH   Final    Culture NO GROWTH 1 DAY   Final    Report Status PENDING   Incomplete    Studies/Results: Dg Chest Portable 1 View  02/13/2011  *RADIOLOGY REPORT*  Clinical Data: Fever and chest pain.  PORTABLE CHEST - 1 VIEW  Comparison: 10/23/2010  Findings: Shallow inspiration.  Normal heart size and pulmonary vascularity.  No focal airspace consolidation.  No blunting of costophrenic angles.  No pneumothorax.  Stable appearance since previous study.  IMPRESSION: No evidence of active pulmonary disease.  Original Report Authenticated By: Marlon Pel, M.D.   Scheduled Meds:   . cefTRIAXone (ROCEPHIN) IV  1 g Intravenous Q24H  . docusate sodium  100 mg  Oral BID  . insulin aspart  0-15 Units Subcutaneous TID WC  . insulin glargine  40 Units Subcutaneous QHS  . linagliptin  5 mg Oral Daily  . pantoprazole  40 mg Oral Daily  . phenazopyridine  100 mg Oral TID WC  . terazosin  10 mg Oral BID  . traZODone  50 mg Oral QHS  . DISCONTD: cefTRIAXone (ROCEPHIN) IV  1 g Intravenous Q24H  . DISCONTD: insulin aspart  0-15 Units Subcutaneous Q4H  . DISCONTD: insulin glargine  20 Units Subcutaneous QHS   Continuous Infusions:   . sodium chloride 50 mL/hr at 02/13/11 1427   PRN Meds:.acetaminophen, acetaminophen, alum & mag hydroxide-simeth, HYDROcodone-acetaminophen, morphine, ondansetron (ZOFRAN) IV, ondansetron, senna Assessment/Plan: Patient Active Hospital Problem List: Pyelonephritis (02/13/2011): Culture growing Proteus mirabilis. Continue Rocephin.  DM (02/27/2010): Poorly controlled. Increase Lantus to 50 units subcutaneously nightly. Increase sliding scale NovoLog and add evening and meal coverage.  HYPERTENSION (02/27/2010) controlled Weakness generalized (02/13/2011) unchanged  HYPERLIPIDEMIA (02/27/2010) EXOGENOUS OBESITY (02/27/2010) ANEMIA (04/25/2010), chronic, worse do to acute illness and dilution.  GERD (02/27/2010) GASTROINTESTINAL HEMORRHAGE, HX OF (02/27/2010), no evidence of active GI bleed currently.  Fall (02/13/2011) CKD (chronic kidney disease), stage III (02/13/2011) monitor  BPH (benign prostatic hypertrophy) (02/13/2011)   LOS: 1 day   Rakayla Ricklefs L 02/14/2011, 12:45 PM

## 2011-02-15 LAB — BASIC METABOLIC PANEL
Calcium: 8.8 mg/dL (ref 8.4–10.5)
Creatinine, Ser: 1.84 mg/dL — ABNORMAL HIGH (ref 0.50–1.35)
GFR calc Af Amer: 43 mL/min — ABNORMAL LOW (ref >60)
GFR calc non Af Amer: 36 mL/min — ABNORMAL LOW (ref >60)

## 2011-02-15 LAB — GLUCOSE, CAPILLARY
Glucose-Capillary: 179 mg/dL — ABNORMAL HIGH (ref 70–99)
Glucose-Capillary: 171 mg/dL — ABNORMAL HIGH (ref 70–99)

## 2011-02-15 LAB — CBC
Platelets: 259 10*3/uL (ref 150–400)
RDW: 17.1 % — ABNORMAL HIGH (ref 11.5–15.5)
WBC: 13.3 10*3/uL — ABNORMAL HIGH (ref 4.0–10.5)

## 2011-02-15 LAB — HEMOGLOBIN A1C: Mean Plasma Glucose: 275 mg/dL — ABNORMAL HIGH (ref <117)

## 2011-02-15 LAB — URINE CULTURE: Colony Count: >=100000

## 2011-02-15 MED ORDER — AMLODIPINE BESYLATE 5 MG PO TABS
5.0000 mg | ORAL_TABLET | Freq: Every day | ORAL | Status: DC
  Administered 2011-02-15 – 2011-02-16 (×2): 5 mg via ORAL
  Filled 2011-02-15 (×2): qty 1

## 2011-02-15 MED ORDER — INSULIN GLARGINE 100 UNIT/ML ~~LOC~~ SOLN
56.0000 [IU] | Freq: Every day | SUBCUTANEOUS | Status: DC
  Administered 2011-02-15 – 2011-02-20 (×6): 56 [IU] via SUBCUTANEOUS
  Filled 2011-02-15: qty 3

## 2011-02-15 MED ORDER — CEPHALEXIN 500 MG PO CAPS
500.0000 mg | ORAL_CAPSULE | Freq: Two times a day (BID) | ORAL | Status: DC
  Administered 2011-02-15 – 2011-02-17 (×5): 500 mg via ORAL
  Filled 2011-02-15 (×5): qty 1

## 2011-02-15 MED ORDER — NEBIVOLOL HCL 2.5 MG PO TABS
2.5000 mg | ORAL_TABLET | Freq: Every day | ORAL | Status: DC
  Administered 2011-02-15 – 2011-02-16 (×2): 2.5 mg via ORAL
  Filled 2011-02-15 (×4): qty 1

## 2011-02-15 NOTE — Progress Notes (Addendum)
Subjective: Better but still weak. No back pain or dysuria.  Objective: Vital signs in last 24 hours: Filed Vitals:   02/14/11 0810 02/14/11 1418 02/14/11 2200 02/15/11 0551  BP:  150/70 165/70 183/72  Pulse:  96 98 109  Temp:  99.2 F (37.3 C) 99.7 F (37.6 C) 99.3 F (37.4 C)  TempSrc:  Oral Oral Oral  Resp:  21 20 20   Height:      Weight:      SpO2: 91% 94% 96% 94%   Weight change:   Intake/Output Summary (Last 24 hours) at 02/15/11 1441 Last data filed at 02/15/11 0900  Gross per 24 hour  Intake   1205 ml  Output    300 ml  Net    905 ml   Physical examination: In chair. More comfortable. More talkative. Lungs are clear to auscultation bilaterally without wheezes rhonchi or rales Cardiovascular regular rate and rhythm without murmurs gallops rub Abdomen obese soft nontender nondistended Extremities 1+ pitting edema.  Neurologic: More alert  Lab Results: Basic Metabolic Panel:  Lab 02/15/11 0454 02/14/11 0524  NA 136 138  K 3.7 3.3*  CL 100 102  CO2 27 28  GLUCOSE 196* 206*  BUN 22 23  CREATININE 1.84* 2.15*  CALCIUM 8.8 9.0  MG -- --  PHOS -- --   Liver Function Tests:  Lab 02/13/11 0512  AST 18  ALT 19  ALKPHOS 55  BILITOT 0.3  PROT 6.3  ALBUMIN 3.2*   No results found for this basename: LIPASE:2,AMYLASE:2 in the last 168 hours CBC:  Lab 02/15/11 0515 02/14/11 0524  WBC 13.3* 23.5*  NEUTROABS -- --  HGB 10.7* 10.7*  HCT 33.6* 33.7*  MCV 81.2 81.4  PLT 259 251   Cardiac Enzymes: No results found for this basename: CKTOTAL:3,CKMB:3,CKMBINDEX:3,TROPONINI:3 in the last 168 hours BNP: No results found for this basename: POCBNP:3 in the last 168 hours D-Dimer: No results found for this basename: DDIMER:2 in the last 168 hours CBG:  Lab 02/15/11 1212 02/15/11 0805 02/14/11 2215 02/14/11 1651 02/14/11 1109 02/14/11 0736  GLUCAP 176* 189* 196* 229* 243* 201*   Hemoglobin A1C:  Lab 02/14/11 0524  HGBA1C 11.2*   Fasting Lipid Panel: No  results found for this basename: CHOL,HDL,LDLCALC,TRIG,CHOLHDL,LDLDIRECT in the last 098 hours Thyroid Function Tests: No results found for this basename: TSH,T4TOTAL,FREET4,T3FREE,THYROIDAB in the last 168 hours Anemia Panel: No results found for this basename: VITAMINB12,FOLATE,FERRITIN,TIBC,IRON,RETICCTPCT in the last 168 hours   Micro Results: Recent Results (from the past 240 hour(s))  URINE CULTURE     Status: Normal   Collection Time   02/13/11  5:29 AM      Component Value Range Status Comment   Specimen Description URINE, CLEAN CATCH   Final    Special Requests none Normal   Final    Setup Time [id number]   Final    Colony Count >=100,000 COLONIES/ML   Final    Culture PROTEUS MIRABILIS   Final    Report Status 02/15/2011 FINAL   Final    Organism ID, Bacteria PROTEUS MIRABILIS   Final   CULTURE, BLOOD (ROUTINE X 2)     Status: Normal (Preliminary result)   Collection Time   02/13/11  5:55 AM      Component Value Range Status Comment   Specimen Description Blood RIGHT ARM   Final    Special Requests     Final    Value: BOTTLES DRAWN AEROBIC AND ANAEROBIC 8 CC EACH BOTTLE  Culture NO GROWTH 2 DAYS   Final    Report Status PENDING   Incomplete   CULTURE, BLOOD (ROUTINE X 2)     Status: Normal (Preliminary result)   Collection Time   02/13/11  5:55 AM      Component Value Range Status Comment   Specimen Description Blood RIGHT ARM   Final    Special Requests BOTTLES DRAWN AEROBIC AND ANAEROBIC 8 CC EACH   Final    Culture NO GROWTH 2 DAYS   Final    Report Status PENDING   Incomplete   Scheduled Meds:    . cefTRIAXone (ROCEPHIN) IV  1 g Intravenous Q24H  . docusate sodium  100 mg Oral BID  . insulin aspart  0-20 Units Subcutaneous TID WC  . insulin aspart  0-5 Units Subcutaneous QHS  . insulin aspart  4 Units Subcutaneous TID WC  . insulin glargine  50 Units Subcutaneous QHS  . linagliptin  5 mg Oral Daily  . pantoprazole  40 mg Oral Daily  . phenazopyridine  100  mg Oral TID WC  . terazosin  10 mg Oral BID  . traZODone  50 mg Oral QHS  . DISCONTD: insulin aspart  0-20 Units Subcutaneous Q4H   Continuous Infusions:    . sodium chloride 50 mL/hr at 02/14/11 1438   PRN Meds:.acetaminophen, alum & mag hydroxide-simeth, HYDROcodone-acetaminophen, morphine, ondansetron (ZOFRAN) IV, ondansetron, senna Assessment/Plan: Patient Active Hospital Problem List: Pyelonephritis (02/13/2011): Final culture results show Proteus mirabilis. It is sensitive to everything except nitrofurantoin. As he is tolerating a diet, I will change him to oral antibiotics, cefalexin.  DM (02/27/2010): Poorly controlled. Increase Lantus to 56 units subcutaneously nightly. Continue sliding scale NovoLog and meal coverage.  HYPERTENSION (02/27/2010) BP up.  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  GERD (02/27/2010) GASTROINTESTINAL HEMORRHAGE, HX OF (02/27/2010), no evidence of active GI bleed currently.  Fall (02/13/2011) Will get PT consult. Patient lives alone.  CKD (chronic kidney disease), stage III (02/13/2011) monitor  BPH (benign prostatic hypertrophy) (02/13/2011) Lasix still held.  D/C IVF.   LOS: 2 days   Altagracia Rone L 02/15/2011, 2:41 PM

## 2011-02-16 LAB — BASIC METABOLIC PANEL
BUN: 25 mg/dL — ABNORMAL HIGH (ref 6–23)
Chloride: 98 mEq/L (ref 96–112)
GFR calc Af Amer: 42 mL/min — ABNORMAL LOW (ref >60)
GFR calc non Af Amer: 34 mL/min — ABNORMAL LOW (ref >60)
Potassium: 3.8 mEq/L (ref 3.5–5.1)

## 2011-02-16 LAB — GLUCOSE, CAPILLARY
Glucose-Capillary: 145 mg/dL — ABNORMAL HIGH (ref 70–99)
Glucose-Capillary: 245 mg/dL — ABNORMAL HIGH (ref 70–99)
Glucose-Capillary: 191 mg/dL — ABNORMAL HIGH (ref 70–99)

## 2011-02-16 LAB — CBC
Hemoglobin: 10.6 g/dL — ABNORMAL LOW (ref 13.0–17.0)
MCHC: 31.5 g/dL (ref 30.0–36.0)
RDW: 16.9 % — ABNORMAL HIGH (ref 11.5–15.5)
WBC: 11.7 10*3/uL — ABNORMAL HIGH (ref 4.0–10.5)

## 2011-02-16 MED ORDER — AMLODIPINE BESYLATE 5 MG PO TABS
10.0000 mg | ORAL_TABLET | Freq: Every day | ORAL | Status: DC
  Administered 2011-02-16 – 2011-02-21 (×6): 10 mg via ORAL
  Filled 2011-02-16 (×7): qty 2

## 2011-02-16 MED ORDER — INSULIN ASPART 100 UNIT/ML ~~LOC~~ SOLN
7.0000 [IU] | Freq: Three times a day (TID) | SUBCUTANEOUS | Status: DC
  Administered 2011-02-16 – 2011-02-21 (×15): 7 [IU] via SUBCUTANEOUS

## 2011-02-16 MED ORDER — NEBIVOLOL HCL 2.5 MG PO TABS
5.0000 mg | ORAL_TABLET | Freq: Every day | ORAL | Status: DC
  Administered 2011-02-16 – 2011-02-21 (×6): 5 mg via ORAL
  Filled 2011-02-16 (×8): qty 2

## 2011-02-16 MED ORDER — FUROSEMIDE 20 MG PO TABS
20.0000 mg | ORAL_TABLET | Freq: Every day | ORAL | Status: DC
  Administered 2011-02-16 – 2011-02-21 (×6): 20 mg via ORAL
  Filled 2011-02-16 (×6): qty 1

## 2011-02-16 NOTE — Progress Notes (Signed)
Pt up to chair with 2 person assist today.  Pt is still very weak and does not ambulate well at this time.  PT ordered by MD today.

## 2011-02-16 NOTE — Progress Notes (Signed)
Subjective: The patient states that he still feels weak overall. No complaints of abdominal pain, nausea, or vomiting.  Objective: Vital signs in last 24 hours: Filed Vitals:   02/15/11 0551 02/15/11 1400 02/15/11 2101 02/16/11 0555  BP: 183/72 167/76 163/83 155/88  Pulse: 109 103 85 103  Temp: 99.3 F (37.4 C) 99 F (37.2 C) 98.8 F (37.1 C) 99.8 F (37.7 C)  TempSrc: Oral Oral Oral Oral  Resp: 20 20 24 20   Height:      Weight:      SpO2: 94% 91% 96% 90%   Weight change:   Intake/Output Summary (Last 24 hours) at 02/16/11 1250 Last data filed at 02/15/11 1900  Gross per 24 hour  Intake    770 ml  Output    700 ml  Net     70 ml   Physical examination: No acute distress. Lungs are clear to auscultation bilaterally without wheezes rhonchi or rales Cardiovascular regular rate and rhythm without murmurs gallops rub Abdomen obese soft nontender nondistended Extremities 1+ pitting edema.  Neurologic: More alert  Lab Results: Basic Metabolic Panel:  Lab 02/16/11 1610 02/15/11 0515  NA 135 136  K 3.8 3.7  CL 98 100  CO2 26 27  GLUCOSE 161* 196*  BUN 25* 22  CREATININE 1.90* 1.84*  CALCIUM 9.2 8.8  MG -- --  PHOS -- --   Liver Function Tests:  Lab 02/13/11 0512  AST 18  ALT 19  ALKPHOS 55  BILITOT 0.3  PROT 6.3  ALBUMIN 3.2*   No results found for this basename: LIPASE:2,AMYLASE:2 in the last 168 hours CBC:  Lab 02/16/11 0515 02/15/11 0515  WBC 11.7* 13.3*  NEUTROABS -- --  HGB 10.6* 10.7*  HCT 33.6* 33.6*  MCV 81.2 81.2  PLT 282 259   Cardiac Enzymes: No results found for this basename: CKTOTAL:3,CKMB:3,CKMBINDEX:3,TROPONINI:3 in the last 168 hours BNP: No results found for this basename: POCBNP:3 in the last 168 hours D-Dimer: No results found for this basename: DDIMER:2 in the last 168 hours CBG:  Lab 02/16/11 1136 02/16/11 0746 02/15/11 2134 02/15/11 1612 02/15/11 1212 02/15/11 0805  GLUCAP 245* 145* 171* 179* 176* 189*   Hemoglobin  A1C:  Lab 02/14/11 0524  HGBA1C 11.2*   Fasting Lipid Panel: No results found for this basename: CHOL,HDL,LDLCALC,TRIG,CHOLHDL,LDLDIRECT in the last 960 hours Thyroid Function Tests: No results found for this basename: TSH,T4TOTAL,FREET4,T3FREE,THYROIDAB in the last 168 hours Anemia Panel: No results found for this basename: VITAMINB12,FOLATE,FERRITIN,TIBC,IRON,RETICCTPCT in the last 168 hours   Micro Results: Recent Results (from the past 240 hour(s))  URINE CULTURE     Status: Normal   Collection Time   02/13/11  5:29 AM      Component Value Range Status Comment   Specimen Description URINE, CLEAN CATCH   Final    Special Requests none Normal   Final    Setup Time [id number]   Final    Colony Count >=100,000 COLONIES/ML   Final    Culture PROTEUS MIRABILIS   Final    Report Status 02/15/2011 FINAL   Final    Organism ID, Bacteria PROTEUS MIRABILIS   Final   CULTURE, BLOOD (ROUTINE X 2)     Status: Normal (Preliminary result)   Collection Time   02/13/11  5:55 AM      Component Value Range Status Comment   Specimen Description Blood RIGHT ARM   Final    Special Requests     Final    Value:  BOTTLES DRAWN AEROBIC AND ANAEROBIC 8 CC EACH BOTTLE   Culture NO GROWTH 3 DAYS   Final    Report Status PENDING   Incomplete   CULTURE, BLOOD (ROUTINE X 2)     Status: Normal (Preliminary result)   Collection Time   02/13/11  5:55 AM      Component Value Range Status Comment   Specimen Description Blood RIGHT ARM   Final    Special Requests BOTTLES DRAWN AEROBIC AND ANAEROBIC 8 CC EACH   Final    Culture NO GROWTH 3 DAYS   Final    Report Status PENDING   Incomplete   Scheduled Meds:    . amLODipine  10 mg Oral Daily  . cephALEXin  500 mg Oral Q12H  . docusate sodium  100 mg Oral BID  . furosemide  20 mg Oral Daily  . insulin aspart  0-20 Units Subcutaneous TID WC  . insulin aspart  0-5 Units Subcutaneous QHS  . insulin aspart  7 Units Subcutaneous TID WC  . insulin glargine   56 Units Subcutaneous QHS  . linagliptin  5 mg Oral Daily  . nebivolol  5 mg Oral Daily  . pantoprazole  40 mg Oral Daily  . terazosin  10 mg Oral BID  . traZODone  50 mg Oral QHS  . DISCONTD: amLODipine  5 mg Oral Daily  . DISCONTD: cefTRIAXone (ROCEPHIN) IV  1 g Intravenous Q24H  . DISCONTD: insulin aspart  4 Units Subcutaneous TID WC  . DISCONTD: insulin glargine  50 Units Subcutaneous QHS  . DISCONTD: nebivolol  2.5 mg Oral Daily   Continuous Infusions:    . DISCONTD: sodium chloride 50 mL/hr at 02/14/11 1438   PRN Meds:.acetaminophen, alum & mag hydroxide-simeth, HYDROcodone-acetaminophen, morphine, ondansetron (ZOFRAN) IV, ondansetron, senna Assessment/Plan: Patient Active Hospital Problem List: Pyelonephritis (02/13/2011): Final culture results show Proteus mirabilis. It is sensitive to everything except nitrofurantoin. As he is tolerating a diet, I will change him to oral antibiotics, cefalexin.  DM (02/27/2010): Poorly controlled. Increase Lantus to 56 units subcutaneously nightly. Continue sliding scale NovoLog and meal coverage. Will increase NovoLog meal coverage to 7 units. HYPERTENSION (02/27/2010) BP up. Will increase the dose of Norvasc and Bystolic to prehospital doses We will restart Lasix.  Weakness generalized (02/13/2011) We'll consult PT. 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  GERD (02/27/2010) GASTROINTESTINAL HEMORRHAGE, HX OF (02/27/2010), no evidence of active GI bleed currently.  Fall (02/13/2011)   CKD (chronic kidney disease), stage III (02/13/2011) monitor  BPH (benign prostatic hypertrophy) (02/13/2011)    LOS: 3 days   Yumna Ebers 02/16/2011, 12:50 PM

## 2011-02-17 LAB — GLUCOSE, CAPILLARY
Glucose-Capillary: 163 mg/dL — ABNORMAL HIGH (ref 70–99)
Glucose-Capillary: 170 mg/dL — ABNORMAL HIGH (ref 70–99)

## 2011-02-17 LAB — CBC
HCT: 35.9 % — ABNORMAL LOW (ref 39.0–52.0)
MCHC: 31.5 g/dL (ref 30.0–36.0)
MCV: 80.5 fL (ref 78.0–100.0)
RDW: 16.9 % — ABNORMAL HIGH (ref 11.5–15.5)

## 2011-02-17 LAB — BASIC METABOLIC PANEL
BUN: 30 mg/dL — ABNORMAL HIGH (ref 6–23)
CO2: 28 mEq/L (ref 19–32)
Calcium: 9.8 mg/dL (ref 8.4–10.5)
Chloride: 101 mEq/L (ref 96–112)
Creatinine, Ser: 2.2 mg/dL — ABNORMAL HIGH (ref 0.50–1.35)

## 2011-02-17 MED ORDER — DEXTROSE 5 % IV SOLN
1.0000 g | INTRAVENOUS | Status: DC
  Administered 2011-02-17 – 2011-02-21 (×5): 1 g via INTRAVENOUS
  Filled 2011-02-17 (×7): qty 1

## 2011-02-17 MED ORDER — VANCOMYCIN HCL 1000 MG IV SOLR
1250.0000 mg | INTRAVENOUS | Status: DC
  Administered 2011-02-17 – 2011-02-20 (×4): 1250 mg via INTRAVENOUS
  Filled 2011-02-17 (×7): qty 1250

## 2011-02-17 MED ORDER — HYDRALAZINE HCL 10 MG PO TABS
10.0000 mg | ORAL_TABLET | Freq: Three times a day (TID) | ORAL | Status: DC
  Administered 2011-02-17 – 2011-02-19 (×6): 10 mg via ORAL
  Filled 2011-02-17 (×6): qty 1

## 2011-02-17 NOTE — Plan of Care (Signed)
Problem: Consults Goal: Diabetes Guidelines if Diabetic/Glucose > 140 If diabetic or lab glucose is > 140 mg/dl - Initiate Diabetes/Hyperglycemia Guidelines & Document Interventions  Outcome: Completed/Met Date Met:  02/17/11 Path reviewed and patient assessed.  Insulin adjustments made last pm.  Will continue to monitor.

## 2011-02-17 NOTE — Progress Notes (Signed)
Subjective:  Patient is sleeping. He seems confused when awakened. No complaints of pain or shortness of breath. No complaints of diarrhea. No complaints of cough.  Objective: Vital signs in last 24 hours: Filed Vitals:   02/16/11 2114 02/16/11 2150 02/17/11 0300 02/17/11 0450  BP: 171/81   198/85  Pulse: 98   94  Temp: 100.9 F (38.3 C) 99 F (37.2 C) 98.9 F (37.2 C) 100.8 F (38.2 C)  TempSrc: Oral Oral Oral Oral  Resp: 20   22  Height:      Weight:      SpO2: 94%   95%   Weight change:   Intake/Output Summary (Last 24 hours) at 02/17/11 1816 Last data filed at 02/17/11 1530  Gross per 24 hour  Intake    780 ml  Output    750 ml  Net     30 ml   Physical examination: No acute distress. Lungs are clear to auscultation bilaterally without wheezes rhonchi or rales Cardiovascular: S1-S2 with a soft systolic murmur. Abdomen obese soft nontender nondistended Extremities 1+ pitting edema.  Neurologic: Initially confused, but became more oriented when he was more awake.  Lab Results: Basic Metabolic Panel:  Lab 02/17/11 7829 02/16/11 0515  NA 139 135  K 4.0 3.8  CL 101 98  CO2 28 26  GLUCOSE 178* 161*  BUN 30* 25*  CREATININE 2.20* 1.90*  CALCIUM 9.8 9.2  MG -- --  PHOS -- --   Liver Function Tests:  Lab 02/13/11 0512  AST 18  ALT 19  ALKPHOS 55  BILITOT 0.3  PROT 6.3  ALBUMIN 3.2*   No results found for this basename: LIPASE:2,AMYLASE:2 in the last 168 hours CBC:  Lab 02/17/11 0614 02/16/11 0515  WBC 11.2* 11.7*  NEUTROABS -- --  HGB 11.3* 10.6*  HCT 35.9* 33.6*  MCV 80.5 81.2  PLT 300 282   Cardiac Enzymes: No results found for this basename: CKTOTAL:3,CKMB:3,CKMBINDEX:3,TROPONINI:3 in the last 168 hours BNP: No results found for this basename: POCBNP:3 in the last 168 hours D-Dimer: No results found for this basename: DDIMER:2 in the last 168 hours CBG:  Lab 02/17/11 1649 02/17/11 1353 02/17/11 0742 02/16/11 2023 02/16/11 1636 02/16/11  1136  GLUCAP 202* 163* 159* 165* 191* 245*   Hemoglobin A1C:  Lab 02/14/11 0524  HGBA1C 11.2*   Fasting Lipid Panel: No results found for this basename: CHOL,HDL,LDLCALC,TRIG,CHOLHDL,LDLDIRECT in the last 562 hours Thyroid Function Tests: No results found for this basename: TSH,T4TOTAL,FREET4,T3FREE,THYROIDAB in the last 168 hours Anemia Panel: No results found for this basename: VITAMINB12,FOLATE,FERRITIN,TIBC,IRON,RETICCTPCT in the last 168 hours   Micro Results: Recent Results (from the past 240 hour(s))  URINE CULTURE     Status: Normal   Collection Time   02/13/11  5:29 AM      Component Value Range Status Comment   Specimen Description URINE, CLEAN CATCH   Final    Special Requests none Normal   Final    Setup Time [id number]   Final    Colony Count >=100,000 COLONIES/ML   Final    Culture PROTEUS MIRABILIS   Final    Report Status 02/15/2011 FINAL   Final    Organism ID, Bacteria PROTEUS MIRABILIS   Final   CULTURE, BLOOD (ROUTINE X 2)     Status: Normal (Preliminary result)   Collection Time   02/13/11  5:55 AM      Component Value Range Status Comment   Specimen Description BLOOD RIGHT ARM  Final    Special Requests     Final    Value: BOTTLES DRAWN AEROBIC AND ANAEROBIC 8 CC EACH BOTTLE   Culture NO GROWTH 4 DAYS   Final    Report Status PENDING   Incomplete   CULTURE, BLOOD (ROUTINE X 2)     Status: Normal (Preliminary result)   Collection Time   02/13/11  5:55 AM      Component Value Range Status Comment   Specimen Description BLOOD RIGHT ARM   Final    Special Requests BOTTLES DRAWN AEROBIC AND ANAEROBIC 8 CC EACH   Final    Culture NO GROWTH 4 DAYS   Final    Report Status PENDING   Incomplete   Scheduled Meds:    . amLODipine  10 mg Oral Daily  . cefTRIAXone (ROCEPHIN) IV  1 g Intravenous Q24H  . docusate sodium  100 mg Oral BID  . furosemide  20 mg Oral Daily  . hydrALAZINE  10 mg Oral Q8H  . insulin aspart  0-20 Units Subcutaneous TID WC  .  insulin aspart  0-5 Units Subcutaneous QHS  . insulin aspart  7 Units Subcutaneous TID WC  . insulin glargine  56 Units Subcutaneous QHS  . linagliptin  5 mg Oral Daily  . nebivolol  5 mg Oral Daily  . pantoprazole  40 mg Oral Daily  . terazosin  10 mg Oral BID  . traZODone  50 mg Oral QHS  . vancomycin  1,250 mg Intravenous Q24H  . DISCONTD: cephALEXin  500 mg Oral Q12H   Continuous Infusions:   PRN Meds:.acetaminophen, alum & mag hydroxide-simeth, HYDROcodone-acetaminophen, morphine, ondansetron (ZOFRAN) IV, ondansetron, senna Assessment/Plan: Patient Active Hospital Problem List: Pyelonephritis (02/13/2011): Final culture results show Proteus mirabilis. He has become febrile again on oral cephalexin. We will restart Rocephin and add vancomycin for in-hospital fever. His initial blood cultures are negative. We will order another set today.  DM (02/27/2010): Poorly controlled overall. Lantus dosing has been increased. NovoLog meal time coverage has been increased. His hemoglobin A1c is 11.2, revealing poor outpatient control.  HYPERTENSION (02/27/2010) His blood pressure is becoming more elevated/malignant. We'll add hydralazine to the bystolic, Norvasc, and Lasix.  Weakness generalized (02/13/2011) The physical therapist was consulted. 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  GERD (02/27/2010) GASTROINTESTINAL HEMORRHAGE, HX OF (02/27/2010), no evidence of active GI bleed currently.  Fall (02/13/2011)   CKD (chronic kidney disease), stage III (02/13/2011). Slight increase in creatinine but basically at baseline.  BPH (benign prostatic hypertrophy) (02/13/2011)    LOS: 4 days   John [name] 02/17/2011, 6:16 PM

## 2011-02-17 NOTE — Progress Notes (Signed)
ANTIBIOTIC CONSULT NOTE - INITIAL  Pharmacy Consult for Vancomycin Indication:  Empiric (spiked fever while on Ceftriaxone for pyelonephritis)  Patient Measurements: Height: 5\' 7"  (170.2 cm) Weight: 248 lb 11.2 oz (112.81 kg) IBW/kg (Calculated) : 66.1  Adjusted Body Weight 90kg  Vital Signs: Temp: 100.8 F (38.2 C) (08/21 0450) Temp src: Oral (08/21 0450) BP: 198/85 mmHg (08/21 0450) Pulse Rate: 94  (08/21 0450) Intake/Output from previous day: 08/20 0701 - 08/21 0700 In: 900 [P.O.:900] Out: 550 [Urine:550] Intake/Output from this shift: I/O this shift: In: 240 [P.O.:240] Out: 200 [Urine:200]  Labs:  [facility] 02/17/11 0614 02/16/11 0515 02/15/11 0515  WBC 11.2* 11.7* 13.3*  HGB 11.3* 10.6* 10.7*  PLT 300 282 259  LABCREA -- -- --  CREATININE 2.20* 1.90* 1.84*  CRCLEARANCE -- -- --      Microbiology: Recent Results (from the past 720 hour(s))  URINE CULTURE     Status: Normal   Collection Time   02/13/11  5:29 AM      Component Value Range Status Comment   Specimen Description URINE, CLEAN CATCH   Final    Special Requests none Normal   Final    Setup Time [id number]   Final    Colony Count >=100,000 COLONIES/ML   Final    Culture PROTEUS MIRABILIS   Final    Report Status 02/15/2011 FINAL   Final    Organism ID, Bacteria PROTEUS MIRABILIS   Final   CULTURE, BLOOD (ROUTINE X 2)     Status: Normal (Preliminary result)   Collection Time   02/13/11  5:55 AM      Component Value Range Status Comment   Specimen Description BLOOD RIGHT ARM   Final    Special Requests     Final    Value: BOTTLES DRAWN AEROBIC AND ANAEROBIC 8 CC EACH BOTTLE   Culture NO GROWTH 4 DAYS   Final    Report Status PENDING   Incomplete   CULTURE, BLOOD (ROUTINE X 2)     Status: Normal (Preliminary result)   Collection Time   02/13/11  5:55 AM      Component Value Range Status Comment   Specimen Description BLOOD RIGHT ARM   Final    Special Requests BOTTLES DRAWN AEROBIC AND ANAEROBIC  8 CC EACH   Final    Culture NO GROWTH 4 DAYS   Final    Report Status PENDING   Incomplete     Medical History: Past Medical History  Diagnosis Date  . GI bleed     2006, TCS/TI showed small polyp. EGD showed erosive antral gastritis with two polyp, one oozing and tx. HP neg. SB capsule shoed few jejunal ulcers with bleeding (naproxen and ASA)  . GI bleed     02/2010, EGD->pancreatitc rest, fundal gland polyps,  no bleeding. TCS->blood-tinged colonic effluent throughout the colon without bleeding lesion found, nl TI. SB capsule->SB erosions, nonbleeding, incomplete study. Patient on naproxyn.   . DM (diabetes mellitus)   . Pneumonia   . Hyperlipidemia   . HTN (hypertension)   . Stroke   . GERD (gastroesophageal reflux disease)   . Poor vision     left eye  . Nephrolithiasis   . Sleep apnea   . Obesity   . Ischemic colitis, enteritis, or enterocolitis     flex sig 01/2010  . Chronic renal insufficiency   . 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 GI bleed 10/27/10    ileocolonoscopy/GIVENS capsule study by Dr Rourk->bloody colonic effluent throughout colon but no lesion identified, TA @ hepatic flexure, fresh blood in dital SB on capsule  . Tubular adenoma of colon 10/27/10    on colonoscopy Dr Jena Gauss    Assssment:  Serum creatinine changing daily (worsening at this time).  Empiric therapy  Goal of Therapy:  Vancomycin trough level 15-20 mcg/ml  Plan:   Vancomycin 1250mg  IV Q24HRS. Obtain trough at steady state.   Gilman Buttner, [place] J 02/17/2011,10:56 AM

## 2011-02-17 NOTE — Progress Notes (Signed)
Attempted to do eval.  Pt too drowsy to participate...awakens, but then falls back asleep.  Will try again tomorrow

## 2011-02-17 NOTE — ED Notes (Signed)
+   urine Patient treated with Cipro-sensitive to same-chart appended per protocol MD,

## 2011-02-18 ENCOUNTER — Inpatient Hospital Stay (HOSPITAL_COMMUNITY): Payer: Medicare Other

## 2011-02-18 DIAGNOSIS — R5381 Other malaise: Secondary | ICD-10-CM | POA: Diagnosis present

## 2011-02-18 DIAGNOSIS — G4733 Obstructive sleep apnea (adult) (pediatric): Secondary | ICD-10-CM | POA: Diagnosis present

## 2011-02-18 LAB — GLUCOSE, CAPILLARY
Glucose-Capillary: 126 mg/dL — ABNORMAL HIGH (ref 70–99)
Glucose-Capillary: 185 mg/dL — ABNORMAL HIGH (ref 70–99)

## 2011-02-18 LAB — CBC
HCT: 34.6 % — ABNORMAL LOW (ref 39.0–52.0)
MCV: 79.9 fL (ref 78.0–100.0)
RDW: 16.8 % — ABNORMAL HIGH (ref 11.5–15.5)
WBC: 12.5 10*3/uL — ABNORMAL HIGH (ref 4.0–10.5)

## 2011-02-18 LAB — BLOOD GAS, ARTERIAL
Bicarbonate: 24.4 mEq/L — ABNORMAL HIGH (ref 20.0–24.0)
O2 Content: 2 L/min
Patient temperature: 37
pCO2 arterial: 40.6 mmHg (ref 35.0–45.0)
pH, Arterial: 7.397 (ref 7.350–7.450)

## 2011-02-18 LAB — CULTURE, BLOOD (ROUTINE X 2): Culture: NO GROWTH

## 2011-02-18 LAB — BASIC METABOLIC PANEL
BUN: 32 mg/dL — ABNORMAL HIGH (ref 6–23)
CO2: 27 mEq/L (ref 19–32)
Chloride: 102 mEq/L (ref 96–112)
Creatinine, Ser: 2.04 mg/dL — ABNORMAL HIGH (ref 0.50–1.35)
Glucose, Bld: 132 mg/dL — ABNORMAL HIGH (ref 70–99)

## 2011-02-18 MED ORDER — POTASSIUM CHLORIDE CRYS ER 20 MEQ PO TBCR
20.0000 meq | EXTENDED_RELEASE_TABLET | Freq: Every day | ORAL | Status: AC
  Administered 2011-02-18: 20 meq via ORAL
  Filled 2011-02-18: qty 1

## 2011-02-18 NOTE — Progress Notes (Signed)
Subjective:  Patient is in the chair sleeping, snoring, and appears to have a few seconds of sleep apnea. He is awakened, and alert to self and place.  Objective: Vital signs in last 24 hours: Filed Vitals:   02/17/11 2048 02/18/11 0700 02/18/11 0806 02/18/11 1132  BP: 160/72 151/74  165/75  Pulse: 92 80    Temp: 99.5 F (37.5 C) 98.2 F (36.8 C)    TempSrc: Oral     Resp: 20 24 20    Height:      Weight:      SpO2: 93% 97% 92%    Weight change:   Intake/Output Summary (Last 24 hours) at 02/18/11 1523 Last data filed at 02/17/11 1800  Gross per 24 hour  Intake    540 ml  Output      0 ml  Net    540 ml   Physical examination: No acute distress. Lungs: Occasional crackles auscultated. Breathing is nonlabored. While he was asleep, he was snoring and there were a few seconds of sleep apnea. Cardiovascular: S1-S2 with a soft systolic murmur. Abdomen obese soft nontender nondistended Extremities 1+ pitting edema.  Neurologic: Sleeping, but became alert upon arousal.  Lab Results: Basic Metabolic Panel:  Lab 02/18/11 5784 02/17/11 0614  NA 139 139  K 3.5 4.0  CL 102 101  CO2 27 28  GLUCOSE 132* 178*  BUN 32* 30*  CREATININE 2.04* 2.20*  CALCIUM 9.3 9.8  MG -- --  PHOS -- --   Liver Function Tests:  Lab 02/13/11 0512  AST 18  ALT 19  ALKPHOS 55  BILITOT 0.3  PROT 6.3  ALBUMIN 3.2*   No results found for this basename: LIPASE:2,AMYLASE:2 in the last 168 hours CBC:  Lab 02/18/11 0603 02/17/11 0614  WBC 12.5* 11.2*  NEUTROABS -- --  HGB 11.1* 11.3*  HCT 34.6* 35.9*  MCV 79.9 80.5  PLT 323 300   Cardiac Enzymes: No results found for this basename: CKTOTAL:3,CKMB:3,CKMBINDEX:3,TROPONINI:3 in the last 168 hours BNP: No results found for this basename: POCBNP:3 in the last 168 hours D-Dimer: No results found for this basename: DDIMER:2 in the last 168 hours CBG:  Lab 02/18/11 1154 02/18/11 0751 02/17/11 2137 02/17/11 1649 02/17/11 1353 02/17/11 0742    GLUCAP 166* 126* 170* 202* 163* 159*   Hemoglobin A1C:  Lab 02/14/11 0524  HGBA1C 11.2*   Fasting Lipid Panel: No results found for this basename: CHOL,HDL,LDLCALC,TRIG,CHOLHDL,LDLDIRECT in the last 696 hours Thyroid Function Tests: No results found for this basename: TSH,T4TOTAL,FREET4,T3FREE,THYROIDAB in the last 168 hours Anemia Panel: No results found for this basename: VITAMINB12,FOLATE,FERRITIN,TIBC,IRON,RETICCTPCT in the last 168 hours   Micro Results: Recent Results (from the past 240 hour(s))  URINE CULTURE     Status: Normal   Collection Time   02/13/11  5:29 AM      Component Value Range Status Comment   Specimen Description URINE, CLEAN CATCH   Final    Special Requests none Normal   Final    Setup Time [id number]   Final    Colony Count >=100,000 COLONIES/ML   Final    Culture PROTEUS MIRABILIS   Final    Report Status 02/15/2011 FINAL   Final    Organism ID, Bacteria PROTEUS MIRABILIS   Final   CULTURE, BLOOD (ROUTINE X 2)     Status: Normal (Preliminary result)   Collection Time   02/13/11  5:55 AM      Component Value Range Status Comment   Specimen Description  BLOOD RIGHT ARM   Final    Special Requests     Final    Value: BOTTLES DRAWN AEROBIC AND ANAEROBIC 8 CC EACH BOTTLE   Culture NO GROWTH 4 DAYS   Final    Report Status PENDING   Incomplete   CULTURE, BLOOD (ROUTINE X 2)     Status: Normal (Preliminary result)   Collection Time   02/13/11  5:55 AM      Component Value Range Status Comment   Specimen Description BLOOD RIGHT ARM   Final    Special Requests BOTTLES DRAWN AEROBIC AND ANAEROBIC 8 CC EACH   Final    Culture NO GROWTH 4 DAYS   Final    Report Status PENDING   Incomplete   Scheduled Meds:    . amLODipine  10 mg Oral Daily  . cefTRIAXone (ROCEPHIN) IV  1 g Intravenous Q24H  . docusate sodium  100 mg Oral BID  . furosemide  20 mg Oral Daily  . hydrALAZINE  10 mg Oral Q8H  . insulin aspart  0-20 Units Subcutaneous TID WC  . insulin  aspart  0-5 Units Subcutaneous QHS  . insulin aspart  7 Units Subcutaneous TID WC  . insulin glargine  56 Units Subcutaneous QHS  . linagliptin  5 mg Oral Daily  . nebivolol  5 mg Oral Daily  . pantoprazole  40 mg Oral Daily  . terazosin  10 mg Oral BID  . traZODone  50 mg Oral QHS  . vancomycin  1,250 mg Intravenous Q24H   Continuous Infusions:   PRN Meds:.acetaminophen, alum & mag hydroxide-simeth, HYDROcodone-acetaminophen, morphine, ondansetron (ZOFRAN) IV, ondansetron, senna Assessment/Plan:  Pyelonephritis (02/13/2011): Final culture results show Proteus mirabilis. He became febrile again on oral cephalexin.  Rocephin restarted and added vancomycin for in-hospital fever. His initial blood cultures are negative. Another set of blood cultures were ordered.  DM (02/27/2010): Poorly controlled overall. Lantus dosing has been increased. NovoLog meal time coverage has been increased. His hemoglobin A1c is 11.2, revealing poor outpatient control.  HYPERTENSION (02/27/2010) His blood pressure is becoming more elevated/malignant. Added hydralazine to the bystolic, Norvasc, and Lasix. Will increase hydralazine to 20 mg 3 times daily in the morning.  Weakness generalized (02/13/2011) The physical therapist recommends short-term skilled nursing facility placement.   Probable obstructive sleep apnea: The patient reports being tested, however it was incomplete. He also has some mild chest congestion on exam. His oxygen saturations are borderline low on nasal cannula oxygen supplementation. We will order a chest x-ray today and an ABG on room air. We will also see if the respiratory therapist can monitor overnight pulse oximetry.  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  GERD (02/27/2010) GASTROINTESTINAL HEMORRHAGE, HX OF (02/27/2010), no evidence of active GI bleed currently.  Fall (02/13/2011)   CKD (chronic kidney  disease), stage III (02/13/2011). Slight increase in creatinine but basically at baseline.  BPH (benign prostatic hypertrophy) (02/13/2011)    LOS: 5 days   Cyanne Delmar 02/18/2011, 3:23 PM

## 2011-02-18 NOTE — Progress Notes (Signed)
Physical Therapy Evaluation Patient Name: John [name] Date: 02/18/2011 Problem List:  Patient Active Problem List  Diagnoses  . DM  . HYPERLIPIDEMIA  . EXOGENOUS OBESITY  . ANEMIA  . HYPERTENSION  . CVA  . GERD  . COLONIC POLYPS, HX OF  . GASTROINTESTINAL HEMORRHAGE, HX OF  . Abdominal pain, acute, left lower quadrant  . Constipation  . Melena  . Pyelonephritis  . Weakness generalized  . Fall  . CKD (chronic kidney disease), stage III  . BPH (benign prostatic hypertrophy)   Past Medical History:  Past Medical History  Diagnosis Date  . GI bleed     2006, TCS/TI showed small polyp. EGD showed erosive antral gastritis with two polyp, one oozing and tx. HP neg. SB capsule shoed few jejunal ulcers with bleeding (naproxen and ASA)  . GI bleed     02/2010, EGD->pancreatitc rest, fundal gland polyps,  no bleeding. TCS->blood-tinged colonic effluent throughout the colon without bleeding lesion found, nl TI. SB capsule->SB erosions, nonbleeding, incomplete study. Patient on naproxyn.   . DM (diabetes mellitus)   . Pneumonia   . Hyperlipidemia   . HTN (hypertension)   . Stroke   . GERD (gastroesophageal reflux disease)   . Poor vision     left eye  . Nephrolithiasis   . Sleep apnea   . Obesity   . Ischemic colitis, enteritis, or enterocolitis     flex sig 01/2010  . Chronic renal insufficiency   . Gastric AVM 10/24/10    GI bleed EGD w/ bicap and hemoclip placement, 2 AVMs, presented w/bright red rectal bleeding  . GI bleed 10/27/10    ileocolonoscopy/GIVENS capsule study by Dr Rourk->bloody colonic effluent throughout colon but no lesion identified, TA @ hepatic flexure, fresh blood in dital SB on capsule  . Tubular adenoma of colon 10/27/10    on colonoscopy Dr Jena Gauss   Past Surgical History:  Past Surgical History  Procedure Date  . Back surgery   . Transurethral resection of prostate   . Cataract extraction     bilateral  . Appendectomy   . Cholecystectomy     . Elbow surgery   . Esophagogastroduodenoscopy 10/23/10    GI bleed- capsule    Precautions/Restrictions  Precautions Precautions: Fall Required Braces or Orthoses: No Restrictions Weight Bearing Restrictions: No Prior Functioning  Home Living Type of Home: House Lives With: Alone Home Layout: One level Home Access: Stairs to enter Entrance Stairs-Rails: None Entrance Stairs-Number of Steps: 2 Bathroom Shower/Tub: Engineer, manufacturing systems: Standard Home Adaptive Equipment: Environmental consultant - rolling;Straight cane Additional Comments: doesn't usually use assistive device Prior Function Level of Independence: Independent with basic ADLs;Independent with gait;Independent with transfers Driving: Yes Vocation: Retired Producer, television/film/video: Awake/alert Overall Cognitive Status: Appears within functional limits for tasks assessed Orientation Level: Oriented X4 Sensation/Coordination Sensation Light Touch: Appears Intact Proprioception: Appears Intact Extremity Assessment RUE Assessment RUE Assessment: Within Functional Limits LUE Assessment LUE Assessment: Within Functional Limits RLE Assessment RLE Assessment: Within Functional Limits LLE Assessment LLE Assessment: Within Functional Limits Mobility (including Balance) Bed Mobility Bed Mobility: Yes Supine to Sit: 4: Min assist Sitting - Scoot to Edge of Bed: 4: Min assist Transfers Transfers: Yes Sit to Stand: 6: Modified independent (Device/Increase time) Stand to Sit: 6: Modified independent (Device/Increase time) Ambulation/Gait Ambulation/Gait: Yes Ambulation/Gait Assistance: 3: Mod assist Ambulation/Gait Assistance Details (indicate cue type and reason): occasional "lightheadedness", LOB with staggering to the R Ambulation Distance (Feet): 150 Feet Assistive device: Rolling walker  Gait Pattern: Within Functional Limits;Trunk flexed  Posture/Postural Control Posture/Postural Control: No  significant limitations Balance Balance Assessed:  (WFL) Exercise     End of Session PT - End of Session Equipment Utilized During Treatment: Gait belt Activity Tolerance: Patient tolerated treatment well Patient left: in chair;with call bell in reach Nurse Communication: Mobility status for transfers;Mobility status for ambulation General Behavior During Session: [facility] for tasks performed Cognition: [facility] for tasks performed PT Assessment/Plan/Recommendation PT Assessment Clinical Impression Statement: deconditioned, pleasant gentleman..obesity limiting mobility..it sounds as if he has not been managing extremely well at home PTA as daughter tried to get him into assisted living PT Recommendation/Assessment: Patient will need skilled PT in the acute care venue PT Problem List: Decreased strength;Decreased activity tolerance;Decreased mobility;Decreased knowledge of use of DME;Decreased safety awareness Barriers to Discharge: Decreased caregiver support PT Therapy Diagnosis : Difficulty walking;Generalized weakness PT Plan PT Frequency: Min 2X/week PT Treatment/Interventions: DME instruction;Gait training;Therapeutic exercise;Functional mobility training PT Recommendation Follow Up Recommendations: Skilled nursing facility Equipment Recommended: Defer to next venue PT Goals  Acute Rehab PT Goals PT Goal Formulation: With patient Pt will Ambulate: >150 feet;with min assist;with rolling walker (with no LOB) John [name] L 02/18/2011, 11:40 AM

## 2011-02-18 NOTE — Progress Notes (Signed)
ANTIBIOTIC CONSULT NOTE   Pharmacy Consult for Vancomycin Indication:  Empiric (spiked fever while on Ceftriaxone for pyelonephritis)  Patient Measurements: Height: 5\' 7"  (170.2 cm) Weight: 248 lb 11.2 oz (112.81 kg) IBW/kg (Calculated) : 66.1  Adjusted Body Weight 90kg  Vital Signs: Temp: 98.2 F (36.8 C) (08/22 0700) BP: 151/74 mmHg (08/22 0700) Pulse Rate: 80  (08/22 0700) Intake/Output from previous day: 08/21 0701 - 08/22 0700 In: 780 [P.O.:480; IV Piggyback:300] Out: 200 [Urine:200] Intake/Output from this shift:    Labs:  Basename 02/18/11 0603 02/17/11 0614 02/16/11 0515  WBC 12.5* 11.2* 11.7*  HGB 11.1* 11.3* 10.6*  PLT 323 300 282  LABCREA -- -- --  CREATININE 2.04* 2.20* 1.90*  CRCLEARANCE -- -- --      Microbiology: Recent Results (from the past 720 hour(s))  URINE CULTURE     Status: Normal   Collection Time   02/13/11  5:29 AM      Component Value Range Status Comment   Specimen Description URINE, CLEAN CATCH   Final    Special Requests none Normal   Final    Setup Time [id number]   Final    Colony Count >=100,000 COLONIES/ML   Final    Culture PROTEUS MIRABILIS   Final    Report Status 02/15/2011 FINAL   Final    Organism ID, Bacteria PROTEUS MIRABILIS   Final   CULTURE, BLOOD (ROUTINE X 2)     Status: Normal (Preliminary result)   Collection Time   02/13/11  5:55 AM      Component Value Range Status Comment   Specimen Description BLOOD RIGHT ARM   Final    Special Requests     Final    Value: BOTTLES DRAWN AEROBIC AND ANAEROBIC 8 CC EACH BOTTLE   Culture NO GROWTH 4 DAYS   Final    Report Status PENDING   Incomplete   CULTURE, BLOOD (ROUTINE X 2)     Status: Normal (Preliminary result)   Collection Time   02/13/11  5:55 AM      Component Value Range Status Comment   Specimen Description BLOOD RIGHT ARM   Final    Special Requests BOTTLES DRAWN AEROBIC AND ANAEROBIC 8 CC EACH   Final    Culture NO GROWTH 4 DAYS   Final    Report Status  PENDING   Incomplete     Medical History: Past Medical History  Diagnosis Date  . GI bleed     2006, TCS/TI showed small polyp. EGD showed erosive antral gastritis with two polyp, one oozing and tx. HP neg. SB capsule shoed few jejunal ulcers with bleeding (naproxen and ASA)  . GI bleed     02/2010, EGD->pancreatitc rest, fundal gland polyps,  no bleeding. TCS->blood-tinged colonic effluent throughout the colon without bleeding lesion found, nl TI. SB capsule->SB erosions, nonbleeding, incomplete study. Patient on naproxyn.   . DM (diabetes mellitus)   . Pneumonia   . Hyperlipidemia   . HTN (hypertension)   . Stroke   . GERD (gastroesophageal reflux disease)   . Poor vision     left eye  . Nephrolithiasis   . Sleep apnea   . Obesity   . Ischemic colitis, enteritis, or enterocolitis     flex sig 01/2010  . Chronic renal insufficiency   . Gastric AVM 10/24/10    GI bleed EGD w/ bicap and hemoclip placement, 2 AVMs, presented w/bright red rectal bleeding  . GI bleed 10/27/10  ileocolonoscopy/GIVENS capsule study by Dr Rourk->bloody colonic effluent throughout colon but no lesion identified, TA @ hepatic flexure, fresh blood in dital SB on capsule  . Tubular adenoma of colon 10/27/10    on colonoscopy Dr Jena Gauss    Assssment: Proteus mirabilis in urine Sensitive to Rocephin Serum creatinine changing daily (improved today)  Empiric therapy  Goal of Therapy:  Vancomycin trough level 15-20 mcg/ml Eradicate infection.  Plan:  Vancomycin 1250mg  IV Q24HRS. Obtain trough on Friday  John [name] 02/18/2011,9:48 AM

## 2011-02-19 DIAGNOSIS — E876 Hypokalemia: Secondary | ICD-10-CM | POA: Diagnosis not present

## 2011-02-19 LAB — CBC
HCT: 31.8 % — ABNORMAL LOW (ref 39.0–52.0)
MCH: 25.8 pg — ABNORMAL LOW (ref 26.0–34.0)
MCV: 79.7 fL (ref 78.0–100.0)
RBC: 3.99 MIL/uL — ABNORMAL LOW (ref 4.22–5.81)
RDW: 16.7 % — ABNORMAL HIGH (ref 11.5–15.5)
WBC: 12.2 10*3/uL — ABNORMAL HIGH (ref 4.0–10.5)

## 2011-02-19 LAB — GLUCOSE, CAPILLARY
Glucose-Capillary: 164 mg/dL — ABNORMAL HIGH (ref 70–99)
Glucose-Capillary: 195 mg/dL — ABNORMAL HIGH (ref 70–99)

## 2011-02-19 LAB — BASIC METABOLIC PANEL
BUN: 33 mg/dL — ABNORMAL HIGH (ref 6–23)
CO2: 26 mEq/L (ref 19–32)
Chloride: 102 mEq/L (ref 96–112)
Creatinine, Ser: 2.15 mg/dL — ABNORMAL HIGH (ref 0.50–1.35)

## 2011-02-19 MED ORDER — HYDRALAZINE HCL 10 MG PO TABS
20.0000 mg | ORAL_TABLET | Freq: Three times a day (TID) | ORAL | Status: DC
  Administered 2011-02-19 – 2011-02-21 (×7): 20 mg via ORAL
  Filled 2011-02-19 (×7): qty 2

## 2011-02-19 MED ORDER — POTASSIUM CHLORIDE CRYS ER 20 MEQ PO TBCR
20.0000 meq | EXTENDED_RELEASE_TABLET | Freq: Two times a day (BID) | ORAL | Status: AC
  Administered 2011-02-19 (×2): 20 meq via ORAL
  Filled 2011-02-19 (×2): qty 1

## 2011-02-19 NOTE — Progress Notes (Signed)
Pt's oxygen saturations on room air continually dropped below 88% while sleeping.  Upon going in and assessing pt he was sleeping and having brief periods of apnea, no acute distress noted and Oxygen saturations came up to 90% on room air when awakened.  Pt returned to sleep and oxygen saturations continued to be below 88%.  Oxygen reapplied at 3 L/m nasal cannula and oxygen saturations maintained above 90% while pt was sleeping.  Nursing staff to continue to monitor.

## 2011-02-19 NOTE — Progress Notes (Signed)
Subjective:  Non new complaints.  Objective: Vital signs in last 24 hours: Filed Vitals:   02/18/11 1400 02/18/11 2050 02/19/11 0602 02/19/11 0700  BP: 145/76 157/72 147/71   Pulse: 87 83 83   Temp: 98.1 F (36.7 C) 99.8 F (37.7 C) 98.2 F (36.8 C)   TempSrc: Oral Oral Oral   Resp: 22 20 18    Height:      Weight:      SpO2: 93% 92% 95% 94%   Weight change:   Intake/Output Summary (Last 24 hours) at 02/19/11 1335 Last data filed at 02/19/11 1314  Gross per 24 hour  Intake    720 ml  Output    700 ml  Net     20 ml   Physical examination: No acute distress. Lungs: Occasional crackles auscultated. Breathing is nonlabored. Cardiovascular: S1-S2 with a soft systolic murmur. Abdomen obese soft nontender nondistended Extremities 1+ pitting edema.  Neurologic: Alert and oriented x 2. Nonfocal.  Lab Results: Basic Metabolic Panel:  Lab 02/19/11 9811 02/18/11 0603  NA 138 139  K 3.2* 3.5  CL 102 102  CO2 26 27  GLUCOSE 131* 132*  BUN 33* 32*  CREATININE 2.15* 2.04*  CALCIUM 9.2 9.3  MG -- --  PHOS -- --   Liver Function Tests:  Lab 02/13/11 0512  AST 18  ALT 19  ALKPHOS 55  BILITOT 0.3  PROT 6.3  ALBUMIN 3.2*   No results found for this basename: LIPASE:2,AMYLASE:2 in the last 168 hours CBC:  Lab 02/19/11 0532 02/18/11 0603  WBC 12.2* 12.5*  NEUTROABS -- --  HGB 10.3* 11.1*  HCT 31.8* 34.6*  MCV 79.7 79.9  PLT 351 323   Cardiac Enzymes: No results found for this basename: CKTOTAL:3,CKMB:3,CKMBINDEX:3,TROPONINI:3 in the last 168 hours BNP:  Lab 02/19/11 0536  POCBNP 291.8   D-Dimer: No results found for this basename: DDIMER:2 in the last 168 hours CBG:  Lab 02/19/11 1153 02/19/11 0727 02/18/11 2047 02/18/11 1613 02/18/11 1154 02/18/11 0751  GLUCAP 125* 109* 139* 185* 166* 126*   Hemoglobin A1C:  Lab 02/14/11 0524  HGBA1C 11.2*   Fasting Lipid Panel: No results found for this basename: CHOL,HDL,LDLCALC,TRIG,CHOLHDL,LDLDIRECT in the  last 168 hours Thyroid Function Tests: No results found for this basename: TSH,T4TOTAL,FREET4,T3FREE,THYROIDAB in the last 168 hours Anemia Panel: No results found for this basename: VITAMINB12,FOLATE,FERRITIN,TIBC,IRON,RETICCTPCT in the last 168 hours   Micro Results: Recent Results (from the past 240 hour(s))  URINE CULTURE     Status: Normal   Collection Time   02/13/11  5:29 AM      Component Value Range Status Comment   Specimen Description URINE, CLEAN CATCH   Final    Special Requests none Normal   Final    Setup Time [id number]   Final    Colony Count >=100,000 COLONIES/ML   Final    Culture PROTEUS MIRABILIS   Final    Report Status 02/15/2011 FINAL   Final    Organism ID, Bacteria PROTEUS MIRABILIS   Final   CULTURE, BLOOD (ROUTINE X 2)     Status: Normal (Preliminary result)   Collection Time   02/13/11  5:55 AM      Component Value Range Status Comment   Specimen Description BLOOD RIGHT ARM   Final    Special Requests     Final    Value: BOTTLES DRAWN AEROBIC AND ANAEROBIC 8 CC EACH BOTTLE   Culture NO GROWTH 4 DAYS   Final  Report Status PENDING   Incomplete   CULTURE, BLOOD (ROUTINE X 2)     Status: Normal (Preliminary result)   Collection Time   02/13/11  5:55 AM      Component Value Range Status Comment   Specimen Description BLOOD RIGHT ARM   Final    Special Requests BOTTLES DRAWN AEROBIC AND ANAEROBIC 8 CC EACH   Final    Culture NO GROWTH 4 DAYS   Final    Report Status PENDING   Incomplete   Scheduled Meds:    . amLODipine  10 mg Oral Daily  . cefTRIAXone (ROCEPHIN) IV  1 g Intravenous Q24H  . docusate sodium  100 mg Oral BID  . furosemide  20 mg Oral Daily  . hydrALAZINE  20 mg Oral TID  . insulin aspart  0-20 Units Subcutaneous TID WC  . insulin aspart  0-5 Units Subcutaneous QHS  . insulin aspart  7 Units Subcutaneous TID WC  . insulin glargine  56 Units Subcutaneous QHS  . linagliptin  5 mg Oral Daily  . nebivolol  5 mg Oral Daily  .  pantoprazole  40 mg Oral Daily  . potassium chloride  20 mEq Oral Daily  . potassium chloride  20 mEq Oral BID  . terazosin  10 mg Oral BID  . traZODone  50 mg Oral QHS  . vancomycin  1,250 mg Intravenous Q24H  . DISCONTD: hydrALAZINE  10 mg Oral Q8H   Continuous Infusions:   PRN Meds:.acetaminophen, alum & mag hydroxide-simeth, HYDROcodone-acetaminophen, morphine, ondansetron (ZOFRAN) IV, ondansetron, senna Assessment/Plan:  Pyelonephritis (02/13/2011): Final culture results show Proteus mirabilis. He became febrile again on oral cephalexin.  Rocephin restarted and added vancomycin for in-hospital fever. His initial blood cultures are negative. Another set of blood cultures were ordered; negative so far.  DM (02/27/2010): Poorly controlled overall, CBGs are trending down.  Lantus dosing has been increased. NovoLog meal time coverage has been increased. His hemoglobin A1c is 11.2, revealing poor outpatient control.  HYPERTENSION (02/27/2010) His blood pressure is becoming more elevated/malignant. Added hydralazine to the bystolic, Norvasc, and Lasix. Will increase hydralazine to 20 mg 3 times daily today. Hypokalemia: Start repletion.  Weakness generalized (02/13/2011) The physical therapist recommends short-term skilled nursing facility placement.   Probable obstructive sleep apnea:  He was noted to have hypoxia during sleep. Will try a trial of CPAP. The patient reports being tested, however it was incomplete. His CXR yesterday reveal bronchitic changes.. His oxygen saturations are borderline low on nasal cannula oxygen supplementation. We will order a chest x-ray today and an ABG on room air. We will also see if the respiratory therapist can monitor overnight pulse oximetry.  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  GERD (02/27/2010) GASTROINTESTINAL HEMORRHAGE, HX OF (02/27/2010), no evidence of active GI bleed  currently.  Fall (02/13/2011)   CKD (chronic kidney disease), stage III (02/13/2011). Slight increase in creatinine but basically at baseline.  BPH (benign prostatic hypertrophy) (02/13/2011)    LOS: 6 days   John [name] 02/19/2011, 1:35 PM

## 2011-02-20 LAB — GLUCOSE, CAPILLARY
Glucose-Capillary: 203 mg/dL — ABNORMAL HIGH (ref 70–99)
Glucose-Capillary: 119 mg/dL — ABNORMAL HIGH (ref 70–99)

## 2011-02-20 LAB — BASIC METABOLIC PANEL
BUN: 34 mg/dL — ABNORMAL HIGH (ref 6–23)
Calcium: 8.7 mg/dL (ref 8.4–10.5)
Creatinine, Ser: 2.09 mg/dL — ABNORMAL HIGH (ref 0.50–1.35)
GFR calc Af Amer: 37 mL/min — ABNORMAL LOW (ref >60)
GFR calc non Af Amer: 31 mL/min — ABNORMAL LOW (ref >60)

## 2011-02-20 MED ORDER — SODIUM CHLORIDE 0.9 % IJ SOLN
INTRAMUSCULAR | Status: AC
  Filled 2011-02-20: qty 3

## 2011-02-20 NOTE — Progress Notes (Signed)
Pt failed or rather refused cpap within 15 minutes. A sleep study would be a better determinant of his need ; but I doubt he would use it after study. This is most unfortunate.

## 2011-02-20 NOTE — Consult Note (Signed)
CSW presented bed offers to pt and pt's daughter and they choose [facility].  Facility notified.  Planned d/c tomorrow per MD.   Karn Cassis

## 2011-02-20 NOTE — Progress Notes (Signed)
Nutrition Note  Pt known to RD from previous admissions. Hx of DM with poor dietary compliance; evidenced by elevated (HgA1C 11.1%). Diabetes related medications adjusted. Importance of adherence to consistent carbohydrate intake emphasized as well as exercising portion control. Pt to d/c to [facility] possibly tomorrow. He will be followed by their consultant  RD. Rec he be d/c with Consistent CHO diet.

## 2011-02-20 NOTE — Progress Notes (Signed)
ANTIBIOTIC CONSULT NOTE   Pharmacy Consult for Vancomycin Indication:  Empiric (spiked fever while on Ceftriaxone for pyelonephritis)  Patient Measurements: Height: 5\' 7"  (170.2 cm) Weight: 248 lb 11.2 oz (112.81 kg) IBW/kg (Calculated) : 66.1  Adjusted Body Weight 90kg  Vital Signs: Temp: 99 F (37.2 C) (08/24 0500) Temp src: Tympanic (08/24 0500) BP: 159/72 mmHg (08/24 0500) Pulse Rate: 93  (08/24 0500) Intake/Output from previous day: 08/23 0701 - 08/24 0700 In: 720 [P.O.:720] Out: 551 [Urine:550; Stool:1] Intake/Output from this shift: I/O this shift: In: 240 [P.O.:240] Out: -   Labs:  Basename 02/20/11 0520 02/19/11 0532 02/18/11 0603  WBC -- 12.2* 12.5*  HGB -- 10.3* 11.1*  PLT -- 351 323  LABCREA -- -- --  CREATININE 2.09* 2.15* 2.04*  CRCLEARANCE -- -- --    Vancomycin trough 16.0 mcg/ml  Microbiology: Recent Results (from the past 720 hour(s))  URINE CULTURE     Status: Normal   Collection Time   02/13/11  5:29 AM      Component Value Range Status Comment   Specimen Description URINE, CLEAN CATCH   Final    Special Requests none Normal   Final    Setup Time [id number]   Final    Colony Count >=100,000 COLONIES/ML   Final    Culture PROTEUS MIRABILIS   Final    Report Status 02/15/2011 FINAL   Final    Organism ID, Bacteria PROTEUS MIRABILIS   Final   CULTURE, BLOOD (ROUTINE X 2)     Status: Normal   Collection Time   02/13/11  5:55 AM      Component Value Range Status Comment   Specimen Description BLOOD RIGHT ARM   Final    Special Requests     Final    Value: BOTTLES DRAWN AEROBIC AND ANAEROBIC 8 CC EACH BOTTLE   Culture NO GROWTH 5 DAYS   Final    Report Status 02/18/2011 FINAL   Final   CULTURE, BLOOD (ROUTINE X 2)     Status: Normal   Collection Time   02/13/11  5:55 AM      Component Value Range Status Comment   Specimen Description BLOOD RIGHT ARM   Final    Special Requests BOTTLES DRAWN AEROBIC AND ANAEROBIC 8 CC EACH   Final    Culture NO GROWTH 5 DAYS   Final    Report Status 02/18/2011 FINAL   Final   CULTURE, BLOOD (ROUTINE X 2)     Status: Normal (Preliminary result)   Collection Time   02/17/11 10:33 AM      Component Value Range Status Comment   Specimen Description BLOOD RIGHT ARM   Final    Special Requests BOTTLES DRAWN AEROBIC AND ANAEROBIC 5CC EACH   Final    Culture NO GROWTH 2 DAYS   Final    Report Status PENDING   Incomplete   CULTURE, BLOOD (ROUTINE X 2)     Status: Normal (Preliminary result)   Collection Time   02/17/11 10:33 AM      Component Value Range Status Comment   Specimen Description RIGHT ANTECUBITAL   Final    Special Requests BOTTLES DRAWN AEROBIC AND ANAEROBIC 6CC EACH   Final    Culture NO GROWTH 2 DAYS   Final    Report Status PENDING   Incomplete     Medical History: Past Medical History  Diagnosis Date  . GI bleed     2006, TCS/TI showed small polyp.  EGD showed erosive antral gastritis with two polyp, one oozing and tx. HP neg. SB capsule shoed few jejunal ulcers with bleeding (naproxen and ASA)  . GI bleed     02/2010, EGD->pancreatitc rest, fundal gland polyps,  no bleeding. TCS->blood-tinged colonic effluent throughout the colon without bleeding lesion found, nl TI. SB capsule->SB erosions, nonbleeding, incomplete study. Patient on naproxyn.   . DM (diabetes mellitus)   . Pneumonia   . Hyperlipidemia   . HTN (hypertension)   . Stroke   . GERD (gastroesophageal reflux disease)   . Poor vision     left eye  . Nephrolithiasis   . Sleep apnea   . Obesity   . Ischemic colitis, enteritis, or enterocolitis     flex sig 01/2010  . Chronic renal insufficiency   . Gastric AVM 10/24/10    GI bleed EGD w/ bicap and hemoclip placement, 2 AVMs, presented w/bright red rectal bleeding  . GI bleed 10/27/10    ileocolonoscopy/GIVENS capsule study by Dr Rourk->bloody colonic effluent throughout colon but no lesion identified, TA @ hepatic flexure, fresh blood in dital SB on capsule    . Tubular adenoma of colon 10/27/10    on colonoscopy Dr Jena Gauss    Assssment: Proteus mirabilis in urine Sensitive to Rocephin Serum creatinine changing daily (improved today)  Empiric therapy. Vancomycin trough within target of 15-20 mcg/ml.  Goal of Therapy:  Vancomycin trough level 15-20 mcg/ml Eradicate infection.  Plan:  Continue Vancomycin 1250mg  IV Q24HRS. Obtain trough on Friday Continue to monitor renal function.  Gilman Buttner, [place] J 02/20/2011,11:49 AM

## 2011-02-20 NOTE — Progress Notes (Signed)
Subjective: No new complaints. He believes that he may be feeling a little bit better today.  Objective: Vital signs in last 24 hours: Filed Vitals:   02/19/11 1400 02/19/11 2108 02/20/11 0500 02/20/11 1530  BP: 123/72 117/66 159/72 149/74  Pulse: 84 76 93 78  Temp: 98 F (36.7 C) 97.6 F (36.4 C) 99 F (37.2 C) 97.7 F (36.5 C)  TempSrc: Oral Oral Tympanic Oral  Resp: 18 20 18 22   Height:      Weight:      SpO2: 96% 98% 93% 93%   Weight change:   Intake/Output Summary (Last 24 hours) at 02/20/11 1632 Last data filed at 02/20/11 1400  Gross per 24 hour  Intake    660 ml  Output    651 ml  Net      9 ml   Physical examination: No acute distress. Lungs: Occasional crackles auscultated. Breathing is nonlabored. Cardiovascular: S1-S2 with a soft systolic murmur. Abdomen obese soft nontender nondistended Extremities: Trace pitting edema.  Neurologic: Alert and oriented x 2. Nonfocal.  Lab Results: Basic Metabolic Panel:  Lab 02/20/11 1610 02/19/11 0532  NA 138 138  K 3.6 3.2*  CL 104 102  CO2 23 26  GLUCOSE 136* 131*  BUN 34* 33*  CREATININE 2.09* 2.15*  CALCIUM 8.7 9.2  MG 2.3 --  PHOS -- --   Liver Function Tests: No results found for this basename: AST:2,ALT:2,ALKPHOS:2,BILITOT:2,PROT:2,ALBUMIN:2 in the last 168 hours No results found for this basename: LIPASE:2,AMYLASE:2 in the last 168 hours CBC:  Lab 02/19/11 0532 02/18/11 0603  WBC 12.2* 12.5*  NEUTROABS -- --  HGB 10.3* 11.1*  HCT 31.8* 34.6*  MCV 79.7 79.9  PLT 351 323   Cardiac Enzymes: No results found for this basename: CKTOTAL:3,CKMB:3,CKMBINDEX:3,TROPONINI:3 in the last 168 hours BNP:  Lab 02/19/11 0536  POCBNP 291.8   D-Dimer: No results found for this basename: DDIMER:2 in the last 168 hours CBG:  Lab 02/20/11 1131 02/20/11 0809 02/19/11 2107 02/19/11 1651 02/19/11 1153 02/19/11 0727  GLUCAP 203* 130* 195* 164* 125* 109*   Hemoglobin A1C:  Lab 02/14/11 0524  HGBA1C 11.2*    Fasting Lipid Panel: No results found for this basename: CHOL,HDL,LDLCALC,TRIG,CHOLHDL,LDLDIRECT in the last 960 hours Thyroid Function Tests: No results found for this basename: TSH,T4TOTAL,FREET4,T3FREE,THYROIDAB in the last 168 hours Anemia Panel: No results found for this basename: VITAMINB12,FOLATE,FERRITIN,TIBC,IRON,RETICCTPCT in the last 168 hours   Micro Results: Recent Results (from the past 240 hour(s))  URINE CULTURE     Status: Normal   Collection Time   02/13/11  5:29 AM      Component Value Range Status Comment   Specimen Description URINE, CLEAN CATCH   Final    Special Requests none Normal   Final    Setup Time [id number]   Final    Colony Count >=100,000 COLONIES/ML   Final    Culture PROTEUS MIRABILIS   Final    Report Status 02/15/2011 FINAL   Final    Organism ID, Bacteria PROTEUS MIRABILIS   Final   CULTURE, BLOOD (ROUTINE X 2)     Status: Normal (Preliminary result)   Collection Time   02/13/11  5:55 AM      Component Value Range Status Comment   Specimen Description BLOOD RIGHT ARM   Final    Special Requests     Final    Value: BOTTLES DRAWN AEROBIC AND ANAEROBIC 8 CC EACH BOTTLE   Culture NO GROWTH 4 DAYS  Final    Report Status PENDING   Incomplete   CULTURE, BLOOD (ROUTINE X 2)     Status: Normal (Preliminary result)   Collection Time   02/13/11  5:55 AM      Component Value Range Status Comment   Specimen Description BLOOD RIGHT ARM   Final    Special Requests BOTTLES DRAWN AEROBIC AND ANAEROBIC 8 CC EACH   Final    Culture NO GROWTH 4 DAYS   Final    Report Status PENDING   Incomplete   Scheduled Meds:    . amLODipine  10 mg Oral Daily  . cefTRIAXone (ROCEPHIN) IV  1 g Intravenous Q24H  . docusate sodium  100 mg Oral BID  . furosemide  20 mg Oral Daily  . hydrALAZINE  20 mg Oral TID  . insulin aspart  0-20 Units Subcutaneous TID WC  . insulin aspart  0-5 Units Subcutaneous QHS  . insulin aspart  7 Units Subcutaneous TID WC  . insulin  glargine  56 Units Subcutaneous QHS  . linagliptin  5 mg Oral Daily  . nebivolol  5 mg Oral Daily  . pantoprazole  40 mg Oral Daily  . potassium chloride  20 mEq Oral BID  . sodium chloride      . terazosin  10 mg Oral BID  . traZODone  50 mg Oral QHS  . vancomycin  1,250 mg Intravenous Q24H   Continuous Infusions:   PRN Meds:.acetaminophen, alum & mag hydroxide-simeth, HYDROcodone-acetaminophen, morphine, ondansetron (ZOFRAN) IV, ondansetron, senna Assessment/Plan:  Pyelonephritis (02/13/2011): He appears to be improving clinically. He may be ready for discharge tomorrow. Final culture results show Proteus mirabilis. He became febrile again on oral cephalexin.  Rocephin restarted and added vancomycin for in-hospital fever. His initial blood cultures are negative. Another set of blood cultures were ordered; negative so far.  DM (02/27/2010): Poorly controlled overall, CBGs are trending down.  Lantus dosing has been increased. NovoLog meal time coverage has been increased. His hemoglobin A1c is 11.2, revealing poor outpatient control.  HYPERTENSION (02/27/2010)  Added hydralazine to the bystolic, Norvasc, and Lasix for better blood pressure control. Hypokalemia: Start repletion.  Weakness generalized (02/13/2011) The physical therapist recommends short-term skilled nursing facility placement.   Probable obstructive sleep apnea:  He was noted to have hypoxia during sleep. Will try a trial of CPAP. If he can tolerate it, it is likely that he will need another outpatient sleep study for confirmation so that his insurance will cover it.  This can be arranged later.  The patient reports being tested, however it was incomplete. His CXR revealed bronchitic changes. His ABG a couple days ago was surprisingly unremarkable. His pulse oximetry overnight, consistently revealed hypoxia with oxygen saturations falling below 88%, suggestive of obstructive sleep apnea.Marland Kitchen  HYPERLIPIDEMIA (02/27/2010) EXOGENOUS  OBESITY (02/27/2010) ANEMIA (04/25/2010), chronic, worse do to acute illness and dilution.  GERD (02/27/2010) GASTROINTESTINAL HEMORRHAGE, HX OF (02/27/2010), no evidence of active GI bleed currently.  Fall (02/13/2011)   CKD (chronic kidney disease), stage III (02/13/2011). Slight increase in creatinine but basically at baseline.  BPH (benign prostatic hypertrophy) (02/13/2011)  Disposition: Likely to a skilled nursing facility tomorrow for short-term rehabilitation.   LOS: 7 days   Adesuwa Osgood 02/20/2011, 4:32 PM

## 2011-02-20 NOTE — Progress Notes (Signed)
Pt ambulated well with standby assist and rolling walker.  Ambulated through each of the hallways on 3L O2.

## 2011-02-20 NOTE — Progress Notes (Signed)
Pt place on auto cpap  Set for 5-20 depending on his demand. With a nasal mask with 3lpm o2 added. I will be surprised if he wears it. As he complains of it burning his nose before it was turned on. Pt also does not keep his o2 on and Sats fall to low 80's. He is not very compliant in his history.

## 2011-02-21 LAB — GLUCOSE, CAPILLARY: Glucose-Capillary: 136 mg/dL — ABNORMAL HIGH (ref 70–99)

## 2011-02-21 MED ORDER — INSULIN ASPART 100 UNIT/ML ~~LOC~~ SOLN
0.0000 [IU] | Freq: Three times a day (TID) | SUBCUTANEOUS | Status: DC
Start: 2011-02-21 — End: 2011-06-26

## 2011-02-21 MED ORDER — HYDRALAZINE HCL 25 MG PO TABS: 20.0000 mg | ORAL_TABLET | Freq: Three times a day (TID) | ORAL | Status: DC

## 2011-02-21 MED ORDER — ACETAMINOPHEN 325 MG PO TABS: 650.0000 mg | ORAL_TABLET | Freq: Four times a day (QID) | ORAL | Status: AC | PRN

## 2011-02-21 MED ORDER — CEPHALEXIN 500 MG PO CAPS: 500.0000 mg | ORAL_CAPSULE | Freq: Two times a day (BID) | ORAL | Status: AC

## 2011-02-21 MED ORDER — TRAZODONE HCL 50 MG PO TABS: 50.0000 mg | ORAL_TABLET | Freq: Two times a day (BID) | ORAL | Status: AC | PRN

## 2011-02-21 MED ORDER — INSULIN ASPART 100 UNIT/ML ~~LOC~~ SOLN: 7.0000 [IU] | Freq: Three times a day (TID) | SUBCUTANEOUS | Status: DC

## 2011-02-21 MED ORDER — SODIUM CHLORIDE 0.9 % IJ SOLN
INTRAMUSCULAR | Status: AC
  Administered 2011-02-21: 11:00:00
  Filled 2011-02-21: qty 3

## 2011-02-21 MED ORDER — AMLODIPINE BESYLATE 10 MG PO TABS: 10.0000 mg | ORAL_TABLET | Freq: Every day | ORAL | Status: DC

## 2011-02-21 MED ORDER — INSULIN GLARGINE 100 UNIT/ML ~~LOC~~ SOLN: 56.0000 [IU] | Freq: Every day | SUBCUTANEOUS | Status: DC

## 2011-02-21 MED ORDER — ALUM & MAG HYDROXIDE-SIMETH 200-200-20 MG/5ML PO SUSP: 30.0000 mL | Freq: Four times a day (QID) | ORAL | Status: AC | PRN

## 2011-02-21 MED ORDER — NEBIVOLOL HCL 5 MG PO TABS: 5.0000 mg | ORAL_TABLET | Freq: Every day | ORAL | Status: DC

## 2011-02-21 NOTE — Plan of Care (Signed)
Problem: Discharge Progression Outcomes Goal: Other Discharge Outcomes/Goals Outcome: Completed/Met Date Met:  02/21/11 Report called to sana an [facility] lpn  IV removed from rt hand cath tip intact site benign Transferred via EMS

## 2011-02-21 NOTE — Discharge Summary (Signed)
Physician Discharge Summary  Patient ID: John [name] MRN: [id number] DOB/AGE: 1930/12/24 77 y.o.  Admit date: 02/13/2011 Discharge date: 02/21/2011  Discharge Diagnoses:  Principal Problem:  *Pyelonephritis Active Problems:  DM, type 2, uncontrolled  HYPERTENSION  Weakness generalized  HYPERLIPIDEMIA  EXOGENOUS OBESITY  ANEMIA  GERD  GASTROINTESTINAL HEMORRHAGE, HX OF  Fall  CKD (chronic kidney disease), stage III  BPH (benign prostatic hypertrophy)  Physical deconditioning  probable obstructive sleep apnea, and needs outpatient polysomnogram.  Current Discharge Medication List    START taking these medications   Details  acetaminophen (TYLENOL) 325 MG tablet Take 2 tablets (650 mg total) by mouth every 6 (six) hours as needed (or Fever >/= 101).    alum & mag hydroxide-simeth (MAALOX/MYLANTA) 200-200-20 MG/5ML suspension Take 30 mLs by mouth every 6 (six) hours as needed (dyspepsia). Qty: 355 mL    cephALEXin (KEFLEX) 500 MG capsule Take 1 capsule (500 mg total) by mouth 2 (two) times daily.    hydrALAZINE (APRESOLINE) 25 MG tablet Take 1 tablet (25 mg total) by mouth 3 (three) times daily.    !! insulin aspart (NOVOLOG) 100 UNIT/ML injection Inject 7 Units into the skin 3 (three) times daily with meals. Qty: 10 mL    !! insulin aspart (NOVOLOG) 100 UNIT/ML injection Inject 0-20 Units into the skin 3 (three) times daily with meals. Qty: 10 mL    traZODone (DESYREL) 50 MG tablet Take 1 tablet (50 mg total) by mouth 3 times/day as needed-between meals & bedtime for sleep.     !! - Potential duplicate medications found. Please discuss with provider.    CONTINUE these medications which have CHANGED   Details  amLODipine (NORVASC) 10 MG tablet Take 1 tablet (10 mg total) by mouth daily.    insulin glargine (LANTUS) 100 UNIT/ML injection Inject 56 Units into the skin at bedtime. Qty: 10 mL    nebivolol (BYSTOLIC) 5 MG tablet Take 1 tablet (5 mg total) by mouth  daily. Taking 7 mg daily Qty: 30 tablet      CONTINUE these medications which have NOT CHANGED   Details  Choline Fenofibrate (TRILIPIX) 135 MG capsule Take 135 mg by mouth daily.      furosemide (LASIX) 40 MG tablet Take 20 mg by mouth daily.      iron polysaccharides (NIFEREX) 150 MG capsule Take 150 mg by mouth daily.     Multiple Vitamins-Minerals (ONE-A-DAY 50 PLUS PO) Take 1 tablet by mouth daily.      pantoprazole (PROTONIX) 40 MG tablet Take 40 mg by mouth daily.      potassium chloride (K-DUR) 10 MEQ tablet Take 10 mEq by mouth daily.      sitaGLIPtan (JANUVIA) 100 MG tablet Take 50 mg by mouth daily.      terazosin (HYTRIN) 5 MG capsule Take 10 mg by mouth 2 (two) times daily.       STOP taking these medications     ACCU-CHEK AVIVA PLUS test strip      ramipril (ALTACE) 10 MG capsule      B-D ULTRAFINE III SHORT PEN 31G X 8 MM MISC      Polyethylene Glycol 3350 POWD           Follow-up Information    Follow up with John [name] in 1 week.   Contact information:   [address] [place] [address] [place] [address] [phone]          Disposition:  Discharged Condition: Skilled nursing facility, [facility]  Consults:   none  Labs:   Results for orders placed during the hospital encounter of 02/13/11 (from the past 48 hour(s))  GLUCOSE, CAPILLARY     Status: Abnormal   Collection Time   02/19/11 11:53 AM      Component Value Range Comment   Glucose-Capillary 125 (*) 70 - 99 (mg/dL)   GLUCOSE, CAPILLARY     Status: Abnormal   Collection Time   02/19/11  4:51 PM      Component Value Range Comment   Glucose-Capillary 164 (*) 70 - 99 (mg/dL)    Comment 1 Notify RN      Comment 2 Documented in Chart     GLUCOSE, CAPILLARY     Status: Abnormal   Collection Time   02/19/11  9:07 PM      Component Value Range Comment   Glucose-Capillary 195 (*) 70 - 99 (mg/dL)    Comment 1 Notify RN      Comment 2 Documented in Chart      BASIC METABOLIC PANEL     Status: Abnormal   Collection Time   02/20/11  5:20 AM      Component Value Range Comment   Sodium 138  135 - 145 (mEq/L)    Potassium 3.6  3.5 - 5.1 (mEq/L)    Chloride 104  96 - 112 (mEq/L)    CO2 23  19 - 32 (mEq/L)    Glucose, Bld 136 (*) 70 - 99 (mg/dL)    BUN 34 (*) 6 - 23 (mg/dL)    Creatinine, Ser 7.82 (*) 0.50 - 1.35 (mg/dL)    Calcium 8.7  8.4 - 10.5 (mg/dL)    GFR calc non Af Amer 31 (*) >60 (mL/min)    GFR calc Af Amer 37 (*) >60 (mL/min)   MAGNESIUM     Status: Normal   Collection Time   02/20/11  5:20 AM      Component Value Range Comment   Magnesium 2.3  1.5 - 2.5 (mg/dL)   GLUCOSE, CAPILLARY     Status: Abnormal   Collection Time   02/20/11  8:09 AM      Component Value Range Comment   Glucose-Capillary 130 (*) 70 - 99 (mg/dL)    Comment 1 Documented in Chart      Comment 2 Notify RN     [place], [place]     Status: Normal   Collection Time   02/20/11 10:58 AM      Component Value Range Comment   Vancomycin Tr 16.0  10.0 - 20.0 (ug/mL)   GLUCOSE, CAPILLARY     Status: Abnormal   Collection Time   02/20/11 11:31 AM      Component Value Range Comment   Glucose-Capillary 203 (*) 70 - 99 (mg/dL)   GLUCOSE, CAPILLARY     Status: Abnormal   Collection Time   02/20/11  4:50 PM      Component Value Range Comment   Glucose-Capillary 119 (*) 70 - 99 (mg/dL)   GLUCOSE, CAPILLARY     Status: Abnormal   Collection Time   02/20/11  9:06 PM      Component Value Range Comment   Glucose-Capillary 152 (*) 70 - 99 (mg/dL)    Comment 1 Notify RN      Comment 2 Documented in Chart     GLUCOSE, CAPILLARY     Status: Abnormal   Collection Time   02/21/11  7:22 AM  Component Value Range Comment   Glucose-Capillary 153 (*) 70 - 99 (mg/dL)     Diagnostics:  Dg Chest 2 View  02/18/2011  *RADIOLOGY REPORT*  Clinical Data: Mild congestion, history diabetes, hypertension, hyperlipidemia  CHEST - 2 VIEW  Comparison: 02/13/2011  Findings:  Upper-normal size of cardiac silhouette. Calcified tortuous aorta. Pulmonary vascularity normal. Lungs clear. No pleural effusion or pneumothorax. Minimal atelectasis or scarring left base. Bronchitic changes noted. Bones unremarkable.  IMPRESSION: Minimal bronchitic changes at left basilar atelectasis versus scarring.  Original Report Authenticated By: Lollie Marrow, M.D.   Dg Chest Portable 1 View  02/13/2011  *RADIOLOGY REPORT*  Clinical Data: Fever and chest pain.  PORTABLE CHEST - 1 VIEW  Comparison: 10/23/2010  Findings: Shallow inspiration.  Normal heart size and pulmonary vascularity.  No focal airspace consolidation.  No blunting of costophrenic angles.  No pneumothorax.  Stable appearance since previous study.  IMPRESSION: No evidence of active pulmonary disease.  Original Report Authenticated By: Marlon Pel, M.D.   Full Code   Hospital Course: The patient is a 77 year old white male with multiple medical problems who presented with weakness, falls, back pain, dysuria. He had evidence of urinary tract infection and physical examination consistent with pyelonephritis. Culture grew out greater than 100,000 colonies of Proteus mirabilis. It was sensitive to everything except for nitrofurantoin. He improved clinically and was switched to oral antibiotics. Unfortunately, he spiked a fever and had to be placed back on IV antibiotics for several days. He has been afebrile for several days and has received maximal hospital benefit. He has worked with physical therapy and will need short-term skilled nursing facility. He is weak but close to his baseline otherwise.  His diabetes was poorly controlled as evidenced by his random serum glucose and hemoglobin A1c of above 11. His Lantus and diabetes regimen was adjusted. At the time of discharge his blood glucoses ranged in the 1:30 to 150 range.  He has a history of chronic kidney disease stage III which was slightly worse on admission. His ACE  inhibitor was stopped and his other antihypertensives were adjusted for blood pressure control. His vital signs are stable at the time of discharge. Next  Patient is morbidly obese and was noted to be hypoxic with Episodes while asleep. He will need an outpatient polysomnogram. He did have a trial of CPAP while here but was unable to tolerate it. He also reports having had a polysomnogram in the past but it was not completed. His other medical problems remained stable during the hospitalization. Total time on the day of discharge is greater than 30 minutes.   Discharge Exam: Blood pressure 133/75, pulse 84, temperature 98 F (36.7 C), temperature source Oral, resp. rate 22, height 5\' 7"  (1.702 m), weight 112.81 kg (248 lb 11.2 oz), SpO2 94.00%.  Gen.: Asleep arousable. Nontoxic. Comfortable Lungs: Clear to auscultation bilaterally without wheeze rhonchi or rales Cardiovascular regular rate and rhythm without murmurs gallops rubs Abdomen obese soft nontender nondistended  extremities no clubbing cyanosis or edema   Signed: Neomia Herbel L 02/21/2011, 10:35 AM

## 2011-02-21 NOTE — Consult Note (Signed)
Pt to D/C today to [facility].  CSW spoke with Debbie at facility, and with Pt.  Both are in agreement with D/C plan.  Pt to be transported by [facility]. CSW will sign off at this time.

## 2011-02-22 LAB — CULTURE, BLOOD (ROUTINE X 2): Culture: NO GROWTH

## 2011-03-05 ENCOUNTER — Other Ambulatory Visit: Payer: Self-pay | Admitting: Urology

## 2011-03-05 ENCOUNTER — Encounter (HOSPITAL_COMMUNITY): Payer: Medicare Other | Attending: Urology

## 2011-03-05 DIAGNOSIS — Z01818 Encounter for other preprocedural examination: Secondary | ICD-10-CM | POA: Insufficient documentation

## 2011-03-05 LAB — CBC
HCT: 37.1 % — ABNORMAL LOW (ref 39.0–52.0)
Hemoglobin: 11.6 g/dL — ABNORMAL LOW (ref 13.0–17.0)
RBC: 4.65 MIL/uL (ref 4.22–5.81)
WBC: 8 10*3/uL (ref 4.0–10.5)

## 2011-03-05 LAB — BASIC METABOLIC PANEL
CO2: 26 mEq/L (ref 19–32)
Chloride: 102 mEq/L (ref 96–112)
Creatinine, Ser: 2.14 mg/dL — ABNORMAL HIGH (ref 0.50–1.35)
GFR calc Af Amer: 36 mL/min — ABNORMAL LOW (ref >60)
Potassium: 4.2 mEq/L (ref 3.5–5.1)
Sodium: 139 mEq/L (ref 135–145)

## 2011-03-05 LAB — SURGICAL PCR SCREEN
MRSA, PCR: NEGATIVE
Staphylococcus aureus: NEGATIVE

## 2011-03-06 NOTE — Progress Notes (Signed)
Encounter addended by: Clarene Critchley on: 03/06/2011 10:33 AM<BR>     Documentation filed: Flowsheet VN

## 2011-03-12 ENCOUNTER — Ambulatory Visit (HOSPITAL_COMMUNITY): Admission: RE | Admit: 2011-03-12 | Payer: Medicare Other | Source: Ambulatory Visit | Admitting: Urology

## 2011-03-20 LAB — URINALYSIS, ROUTINE W REFLEX MICROSCOPIC
Glucose, UA: NEGATIVE
Hgb urine dipstick: NEGATIVE
Leukocytes, UA: NEGATIVE
pH: 6

## 2011-03-20 LAB — SEDIMENTATION RATE: Sed Rate: 20 — ABNORMAL HIGH

## 2011-03-20 LAB — CBC
Hemoglobin: 12.4 — ABNORMAL LOW
Hemoglobin: 13.8
MCHC: 34
MCHC: 34.3
MCV: 82.5
Platelets: 315
Platelets: 319
RBC: 4.87
RDW: 15.3
RDW: 15.4
WBC: 11.1 — ABNORMAL HIGH
WBC: 11.2 — ABNORMAL HIGH

## 2011-03-20 LAB — BASIC METABOLIC PANEL
BUN: 11
CO2: 29
CO2: 30
Calcium: 9.2
Calcium: 9.3
Chloride: 101
Creatinine, Ser: 1.21
Creatinine, Ser: 1.24
GFR calc Af Amer: >60
GFR calc non Af Amer: 55 — ABNORMAL LOW
Glucose, Bld: 132 — ABNORMAL HIGH
Glucose, Bld: 148 — ABNORMAL HIGH
Potassium: 3.3 — ABNORMAL LOW
Sodium: 141
Sodium: 144

## 2011-03-20 LAB — PROTIME-INR: Prothrombin Time: 13.2

## 2011-03-20 LAB — DIFFERENTIAL
Basophils Absolute: 0.1
Basophils Absolute: 0.1
Basophils Relative: 1
Eosinophils Absolute: 0.4
Eosinophils Absolute: 0.4
Lymphocytes Relative: 25
Lymphs Abs: 2.2
Monocytes Absolute: 0.8
Monocytes Absolute: 0.9
Monocytes Relative: 7
Monocytes Relative: 8
Neutro Abs: 7
Neutrophils Relative %: 65
Neutrophils Relative %: 68

## 2011-03-20 LAB — URINE CULTURE
Colony Count: 2000
Special Requests: NEGATIVE

## 2011-03-20 LAB — LIPASE, BLOOD: Lipase: 22

## 2011-03-20 LAB — URINE MICROSCOPIC-ADD ON

## 2011-03-20 LAB — C-REACTIVE PROTEIN: CRP: 2.4 — ABNORMAL HIGH (ref <0.6)

## 2011-03-26 LAB — POCT I-STAT 4, (NA,K, GLUC, HGB,HCT)
Potassium: 3.9
Sodium: 140

## 2011-04-13 ENCOUNTER — Other Ambulatory Visit (HOSPITAL_COMMUNITY): Payer: Self-pay | Admitting: Nephrology

## 2011-04-13 DIAGNOSIS — N289 Disorder of kidney and ureter, unspecified: Secondary | ICD-10-CM

## 2011-04-16 ENCOUNTER — Other Ambulatory Visit (HOSPITAL_COMMUNITY): Payer: Medicare Other

## 2011-04-23 ENCOUNTER — Ambulatory Visit (HOSPITAL_COMMUNITY)
Admission: RE | Admit: 2011-04-23 | Discharge: 2011-04-23 | Disposition: A | Discharge status: Home / Self Care | Payer: Medicare Other | Source: Ambulatory Visit | Attending: Nephrology | Admitting: Nephrology

## 2011-04-23 DIAGNOSIS — Q619 Cystic kidney disease, unspecified: Secondary | ICD-10-CM | POA: Insufficient documentation

## 2011-04-23 DIAGNOSIS — N289 Disorder of kidney and ureter, unspecified: Secondary | ICD-10-CM | POA: Insufficient documentation

## 2011-06-26 ENCOUNTER — Encounter (HOSPITAL_COMMUNITY): Payer: Self-pay | Admitting: Emergency Medicine

## 2011-06-26 ENCOUNTER — Emergency Department (HOSPITAL_COMMUNITY)
Admission: EM | Admit: 2011-06-26 | Discharge: 2011-06-26 | Disposition: A | Discharge status: Home / Self Care | Payer: Medicare Other | Attending: Emergency Medicine | Admitting: Emergency Medicine

## 2011-06-26 DIAGNOSIS — Z8673 Personal history of transient ischemic attack (TIA), and cerebral infarction without residual deficits: Secondary | ICD-10-CM | POA: Insufficient documentation

## 2011-06-26 DIAGNOSIS — Z87442 Personal history of urinary calculi: Secondary | ICD-10-CM | POA: Insufficient documentation

## 2011-06-26 DIAGNOSIS — K219 Gastro-esophageal reflux disease without esophagitis: Secondary | ICD-10-CM | POA: Insufficient documentation

## 2011-06-26 DIAGNOSIS — E119 Type 2 diabetes mellitus without complications: Secondary | ICD-10-CM | POA: Insufficient documentation

## 2011-06-26 DIAGNOSIS — I129 Hypertensive chronic kidney disease with stage 1 through stage 4 chronic kidney disease, or unspecified chronic kidney disease: Secondary | ICD-10-CM | POA: Insufficient documentation

## 2011-06-26 DIAGNOSIS — E785 Hyperlipidemia, unspecified: Secondary | ICD-10-CM | POA: Insufficient documentation

## 2011-06-26 DIAGNOSIS — R739 Hyperglycemia, unspecified: Secondary | ICD-10-CM

## 2011-06-26 DIAGNOSIS — Z794 Long term (current) use of insulin: Secondary | ICD-10-CM | POA: Insufficient documentation

## 2011-06-26 DIAGNOSIS — N189 Chronic kidney disease, unspecified: Secondary | ICD-10-CM | POA: Insufficient documentation

## 2011-06-26 LAB — BASIC METABOLIC PANEL
BUN: 24 mg/dL — ABNORMAL HIGH (ref 6–23)
CO2: 30 mEq/L (ref 19–32)
Chloride: 97 mEq/L (ref 96–112)
Creatinine, Ser: 1.83 mg/dL — ABNORMAL HIGH (ref 0.50–1.35)

## 2011-06-26 MED ORDER — SODIUM CHLORIDE 0.9 % IV BOLUS (SEPSIS)
1000.0000 mL | Freq: Once | INTRAVENOUS | Status: AC
  Administered 2011-06-26: 1000 mL via INTRAVENOUS

## 2011-06-26 MED ORDER — INSULIN ASPART 100 UNIT/ML ~~LOC~~ SOLN
8.0000 [IU] | Freq: Once | SUBCUTANEOUS | Status: AC
  Administered 2011-06-26: 8 [IU] via INTRAVENOUS
  Filled 2011-06-26: qty 3

## 2011-06-26 NOTE — ED Notes (Signed)
Patient is alert and oriented x 4 with respirations even and unlabored.  NAD at this time.  Discharge instructions reviewed with patient and patient verbalized understanding.  Pt ambulated to lobby with steady gait, and brother to transport pt home.

## 2011-06-26 NOTE — ED Notes (Signed)
Pt states his blood sugar has been 530 last night and today. Pt states he feels tired/weak.

## 2011-06-26 NOTE — ED Notes (Signed)
[id number]

## 2011-06-26 NOTE — ED Provider Notes (Signed)
History     CSN: [id number]  Arrival date & time 06/26/11  1616   First MD Initiated Contact with Patient 06/26/11 1646      Chief Complaint  Patient presents with  . Hyperglycemia    (Consider location/radiation/quality/duration/timing/severity/associated sxs/prior treatment) HPI Comments: John [name] is a 77 y.o. Male with a h/o diabetes, HTN , GERD, stroke with no residual, who presents to the Emergency Department complaining of high blood sugars at home. He recorded sugars in the 500s  Last night and again this morning. He states he has not been eating a lot of sweets. His elevated glucose reading are associated with fatigue. He denies fever, chills, chest pain, shortness of breath, nausea, vomiting, urinary frequency.  PCP Dr. Phillips Odor Endocrinologist Dr. Horald Pollen    Past Medical History  Diagnosis Date  . GI bleed     2006, TCS/TI showed small polyp. EGD showed erosive antral gastritis with two polyp, one oozing and tx. HP neg. SB capsule shoed few jejunal ulcers with bleeding (naproxen and ASA)  . GI bleed     02/2010, EGD->pancreatitc rest, fundal gland polyps,  no bleeding. TCS->blood-tinged colonic effluent throughout the colon without bleeding lesion found, nl TI. SB capsule->SB erosions, nonbleeding, incomplete study. Patient on naproxyn.   . DM (diabetes mellitus)   . Pneumonia   . Hyperlipidemia   . HTN (hypertension)   . Stroke   . GERD (gastroesophageal reflux disease)   . Poor vision     left eye  . Nephrolithiasis   . Sleep apnea   . Obesity   . Ischemic colitis, enteritis, or enterocolitis     flex sig 01/2010  . Chronic renal insufficiency   . Gastric AVM 10/24/10    GI bleed EGD w/ bicap and hemoclip placement, 2 AVMs, presented w/bright red rectal bleeding  . GI bleed 10/27/10    ileocolonoscopy/GIVENS capsule study by Dr Rourk->bloody colonic effluent throughout colon but no lesion identified, TA @ hepatic flexure, fresh blood in dital SB on capsule  .  Tubular adenoma of colon 10/27/10    on colonoscopy Dr Jena Gauss    Past Surgical History  Procedure Date  . Back surgery   . Transurethral resection of prostate   . Cataract extraction     bilateral  . Appendectomy   . Cholecystectomy   . Elbow surgery   . Esophagogastroduodenoscopy 10/23/10    GI bleed- capsule    Family History  Problem Relation Age of Onset  . Aneurysm Daughter     brain, deceased age 65  . GI problems Brother     gi bleed  . Cancer Sister     unknown type  . Colon cancer Neg Hx   . Liver disease Neg Hx     History  Substance Use Topics  . Smoking status: Never Smoker   . Smokeless tobacco: Never Used  . Alcohol Use: No      Review of Systems  Allergies  Betadine; Latex; Povidone-iodine; Propoxyphene hcl; and Sulfonamide derivatives  Home Medications   Current Outpatient Rx  Name Route Sig Dispense Refill  . AMLODIPINE BESYLATE 10 MG PO TABS Oral Take 7.5 mg by mouth every morning.      . CHOLINE FENOFIBRATE 135 MG PO CPDR Oral Take 135 mg by mouth at bedtime.     . FUROSEMIDE 40 MG PO TABS Oral Take 20 mg by mouth daily. Take one tablet one day and take one-half the next day    .  INSULIN GLARGINE 100 UNIT/ML [place] SOLN Subcutaneous Inject 56 Units into the skin at bedtime. 10 mL   . POLYSACCHARIDE IRON COMPLEX 150 MG PO CAPS Oral Take 150 mg by mouth at bedtime.     . ONE-A-DAY 50 PLUS PO Oral Take 1 tablet by mouth at bedtime.     . NEBIVOLOL HCL 10 MG PO TABS Oral Take 10 mg by mouth every morning.      Marland Kitchen PANTOPRAZOLE SODIUM 40 MG PO TBEC Oral Take 40 mg by mouth at bedtime.     Marland Kitchen POTASSIUM CHLORIDE CR 10 MEQ PO TBCR Oral Take 10 mEq by mouth daily.      Marland Kitchen SITAGLIPTIN PHOSPHATE 100 MG PO TABS Oral Take 50 mg by mouth daily.      Marland Kitchen TERAZOSIN HCL 5 MG PO CAPS Oral Take 5 mg by mouth at bedtime.       BP 111/64  Pulse 79  Temp 98.6 F (37 C)  Resp 20  Ht 5\' 7"  (1.702 m)  Wt 248 lb (112.492 kg)  BMI 38.84 kg/m2  SpO2 99%  Physical  Exam  ED Course  Procedures (including critical care time)   visit   MDM  Patient with diabetes and elevated home glucose reading since last night. Has not changed his insulin doses, no recent illness. Labs unremarkable except for hyperglycemia. Initiated IVFs x 3 liters and insulin.  Patient has had PO fluids. Pt stable in ED with no significant deterioration in condition.The patient appears reasonably screened and/or stabilized for discharge and I doubt any other medical condition or other [facility] requiring further screening, evaluation, or treatment in the ED at this time prior to discharge.  MDM Reviewed: previous chart, nursing note and vitals Reviewed previous: labs Interpretation: labs Total time providing critical care: 40.           Nicoletta Dress. Colon Branch, MD 06/26/11 2136

## 2011-08-06 DIAGNOSIS — E1159 Type 2 diabetes mellitus with other circulatory complications: Secondary | ICD-10-CM | POA: Diagnosis not present

## 2011-08-06 DIAGNOSIS — E119 Type 2 diabetes mellitus without complications: Secondary | ICD-10-CM | POA: Diagnosis not present

## 2011-08-20 DIAGNOSIS — M79609 Pain in unspecified limb: Secondary | ICD-10-CM | POA: Diagnosis not present

## 2011-08-20 DIAGNOSIS — L600 Ingrowing nail: Secondary | ICD-10-CM | POA: Diagnosis not present

## 2011-08-27 DIAGNOSIS — L600 Ingrowing nail: Secondary | ICD-10-CM | POA: Diagnosis not present

## 2011-08-27 DIAGNOSIS — M79609 Pain in unspecified limb: Secondary | ICD-10-CM | POA: Diagnosis not present

## 2011-08-28 DIAGNOSIS — E782 Mixed hyperlipidemia: Secondary | ICD-10-CM | POA: Diagnosis not present

## 2011-08-28 DIAGNOSIS — E119 Type 2 diabetes mellitus without complications: Secondary | ICD-10-CM | POA: Diagnosis not present

## 2011-08-28 DIAGNOSIS — E669 Obesity, unspecified: Secondary | ICD-10-CM | POA: Diagnosis not present

## 2011-08-28 DIAGNOSIS — G4733 Obstructive sleep apnea (adult) (pediatric): Secondary | ICD-10-CM | POA: Diagnosis not present

## 2011-09-07 DIAGNOSIS — I10 Essential (primary) hypertension: Secondary | ICD-10-CM | POA: Diagnosis not present

## 2011-09-07 DIAGNOSIS — E7800 Pure hypercholesterolemia, unspecified: Secondary | ICD-10-CM | POA: Diagnosis not present

## 2011-09-07 DIAGNOSIS — R809 Proteinuria, unspecified: Secondary | ICD-10-CM | POA: Diagnosis not present

## 2011-09-14 DIAGNOSIS — E559 Vitamin D deficiency, unspecified: Secondary | ICD-10-CM | POA: Diagnosis not present

## 2011-09-14 DIAGNOSIS — R809 Proteinuria, unspecified: Secondary | ICD-10-CM | POA: Diagnosis not present

## 2011-09-14 DIAGNOSIS — E1165 Type 2 diabetes mellitus with hyperglycemia: Secondary | ICD-10-CM | POA: Diagnosis not present

## 2011-09-14 DIAGNOSIS — E1129 Type 2 diabetes mellitus with other diabetic kidney complication: Secondary | ICD-10-CM | POA: Diagnosis not present

## 2011-09-14 DIAGNOSIS — E782 Mixed hyperlipidemia: Secondary | ICD-10-CM | POA: Diagnosis not present

## 2011-09-14 DIAGNOSIS — N189 Chronic kidney disease, unspecified: Secondary | ICD-10-CM | POA: Diagnosis not present

## 2011-09-14 DIAGNOSIS — Z79899 Other long term (current) drug therapy: Secondary | ICD-10-CM | POA: Diagnosis not present

## 2011-09-23 DIAGNOSIS — E1129 Type 2 diabetes mellitus with other diabetic kidney complication: Secondary | ICD-10-CM | POA: Diagnosis not present

## 2011-09-23 DIAGNOSIS — E559 Vitamin D deficiency, unspecified: Secondary | ICD-10-CM | POA: Diagnosis not present

## 2011-09-23 DIAGNOSIS — I10 Essential (primary) hypertension: Secondary | ICD-10-CM | POA: Diagnosis not present

## 2011-09-23 DIAGNOSIS — E1165 Type 2 diabetes mellitus with hyperglycemia: Secondary | ICD-10-CM | POA: Diagnosis not present

## 2011-10-07 DIAGNOSIS — N189 Chronic kidney disease, unspecified: Secondary | ICD-10-CM | POA: Diagnosis not present

## 2011-10-22 DIAGNOSIS — E1159 Type 2 diabetes mellitus with other circulatory complications: Secondary | ICD-10-CM | POA: Diagnosis not present

## 2011-10-22 DIAGNOSIS — E119 Type 2 diabetes mellitus without complications: Secondary | ICD-10-CM | POA: Diagnosis not present

## 2011-11-10 DIAGNOSIS — M25569 Pain in unspecified knee: Secondary | ICD-10-CM | POA: Diagnosis not present

## 2011-11-10 DIAGNOSIS — Z6838 Body mass index (BMI) 38.0-38.9, adult: Secondary | ICD-10-CM | POA: Diagnosis not present

## 2011-12-08 DIAGNOSIS — D649 Anemia, unspecified: Secondary | ICD-10-CM | POA: Diagnosis not present

## 2011-12-08 DIAGNOSIS — N189 Chronic kidney disease, unspecified: Secondary | ICD-10-CM | POA: Diagnosis not present

## 2011-12-08 DIAGNOSIS — Z79899 Other long term (current) drug therapy: Secondary | ICD-10-CM | POA: Diagnosis not present

## 2011-12-08 DIAGNOSIS — I10 Essential (primary) hypertension: Secondary | ICD-10-CM | POA: Diagnosis not present

## 2011-12-08 DIAGNOSIS — R809 Proteinuria, unspecified: Secondary | ICD-10-CM | POA: Diagnosis not present

## 2011-12-08 DIAGNOSIS — E559 Vitamin D deficiency, unspecified: Secondary | ICD-10-CM | POA: Diagnosis not present

## 2011-12-09 DIAGNOSIS — R809 Proteinuria, unspecified: Secondary | ICD-10-CM | POA: Diagnosis not present

## 2011-12-09 DIAGNOSIS — E559 Vitamin D deficiency, unspecified: Secondary | ICD-10-CM | POA: Diagnosis not present

## 2012-01-07 DIAGNOSIS — Z6838 Body mass index (BMI) 38.0-38.9, adult: Secondary | ICD-10-CM | POA: Diagnosis not present

## 2012-01-07 DIAGNOSIS — H6120 Impacted cerumen, unspecified ear: Secondary | ICD-10-CM | POA: Diagnosis not present

## 2012-01-07 DIAGNOSIS — S335XXA Sprain of ligaments of lumbar spine, initial encounter: Secondary | ICD-10-CM | POA: Diagnosis not present

## 2012-02-16 DIAGNOSIS — N401 Enlarged prostate with lower urinary tract symptoms: Secondary | ICD-10-CM | POA: Diagnosis not present

## 2012-02-16 DIAGNOSIS — R972 Elevated prostate specific antigen [PSA]: Secondary | ICD-10-CM | POA: Diagnosis not present

## 2012-02-16 DIAGNOSIS — I10 Essential (primary) hypertension: Secondary | ICD-10-CM | POA: Diagnosis not present

## 2012-02-16 DIAGNOSIS — N3941 Urge incontinence: Secondary | ICD-10-CM | POA: Diagnosis not present

## 2012-02-16 DIAGNOSIS — R809 Proteinuria, unspecified: Secondary | ICD-10-CM | POA: Diagnosis not present

## 2012-02-16 DIAGNOSIS — E7800 Pure hypercholesterolemia, unspecified: Secondary | ICD-10-CM | POA: Diagnosis not present

## 2012-02-18 DIAGNOSIS — N189 Chronic kidney disease, unspecified: Secondary | ICD-10-CM | POA: Diagnosis not present

## 2012-02-18 DIAGNOSIS — Z6838 Body mass index (BMI) 38.0-38.9, adult: Secondary | ICD-10-CM | POA: Diagnosis not present

## 2012-02-18 DIAGNOSIS — R5381 Other malaise: Secondary | ICD-10-CM | POA: Diagnosis not present

## 2012-02-18 DIAGNOSIS — E785 Hyperlipidemia, unspecified: Secondary | ICD-10-CM | POA: Diagnosis not present

## 2012-02-18 DIAGNOSIS — E109 Type 1 diabetes mellitus without complications: Secondary | ICD-10-CM | POA: Diagnosis not present

## 2012-02-18 DIAGNOSIS — I10 Essential (primary) hypertension: Secondary | ICD-10-CM | POA: Diagnosis not present

## 2012-02-18 DIAGNOSIS — L0291 Cutaneous abscess, unspecified: Secondary | ICD-10-CM | POA: Diagnosis not present

## 2012-02-18 DIAGNOSIS — R5383 Other fatigue: Secondary | ICD-10-CM | POA: Diagnosis not present

## 2012-02-18 DIAGNOSIS — L0390 Cellulitis, unspecified: Secondary | ICD-10-CM | POA: Diagnosis not present

## 2012-03-17 DIAGNOSIS — E1159 Type 2 diabetes mellitus with other circulatory complications: Secondary | ICD-10-CM | POA: Diagnosis not present

## 2012-03-17 DIAGNOSIS — E119 Type 2 diabetes mellitus without complications: Secondary | ICD-10-CM | POA: Diagnosis not present

## 2012-03-21 DIAGNOSIS — Z79899 Other long term (current) drug therapy: Secondary | ICD-10-CM | POA: Diagnosis not present

## 2012-03-21 DIAGNOSIS — N189 Chronic kidney disease, unspecified: Secondary | ICD-10-CM | POA: Diagnosis not present

## 2012-03-21 DIAGNOSIS — R809 Proteinuria, unspecified: Secondary | ICD-10-CM | POA: Diagnosis not present

## 2012-03-21 DIAGNOSIS — D649 Anemia, unspecified: Secondary | ICD-10-CM | POA: Diagnosis not present

## 2012-03-21 DIAGNOSIS — E559 Vitamin D deficiency, unspecified: Secondary | ICD-10-CM | POA: Diagnosis not present

## 2012-03-22 DIAGNOSIS — E119 Type 2 diabetes mellitus without complications: Secondary | ICD-10-CM | POA: Diagnosis not present

## 2012-03-22 DIAGNOSIS — I10 Essential (primary) hypertension: Secondary | ICD-10-CM | POA: Diagnosis not present

## 2012-03-22 DIAGNOSIS — E669 Obesity, unspecified: Secondary | ICD-10-CM | POA: Diagnosis not present

## 2012-03-22 DIAGNOSIS — E782 Mixed hyperlipidemia: Secondary | ICD-10-CM | POA: Diagnosis not present

## 2012-03-23 DIAGNOSIS — R609 Edema, unspecified: Secondary | ICD-10-CM | POA: Diagnosis not present

## 2012-03-23 DIAGNOSIS — E559 Vitamin D deficiency, unspecified: Secondary | ICD-10-CM | POA: Diagnosis not present

## 2012-03-23 DIAGNOSIS — R809 Proteinuria, unspecified: Secondary | ICD-10-CM | POA: Diagnosis not present

## 2012-03-30 DIAGNOSIS — Z6838 Body mass index (BMI) 38.0-38.9, adult: Secondary | ICD-10-CM | POA: Diagnosis not present

## 2012-03-30 DIAGNOSIS — J069 Acute upper respiratory infection, unspecified: Secondary | ICD-10-CM | POA: Diagnosis not present

## 2012-03-30 DIAGNOSIS — M549 Dorsalgia, unspecified: Secondary | ICD-10-CM | POA: Diagnosis not present

## 2012-03-30 DIAGNOSIS — Z7182 Exercise counseling: Secondary | ICD-10-CM | POA: Diagnosis not present

## 2012-03-30 DIAGNOSIS — Z713 Dietary counseling and surveillance: Secondary | ICD-10-CM | POA: Diagnosis not present

## 2012-04-06 ENCOUNTER — Encounter: Payer: Self-pay | Admitting: Gastroenterology

## 2012-04-06 ENCOUNTER — Ambulatory Visit (INDEPENDENT_AMBULATORY_CARE_PROVIDER_SITE_OTHER): Payer: Medicare Other | Admitting: Gastroenterology

## 2012-04-06 VITALS — BP 171/79 | HR 77 | Temp 98.0°F | Ht 67.0 in | Wt 267.6 lb

## 2012-04-06 DIAGNOSIS — R0602 Shortness of breath: Secondary | ICD-10-CM | POA: Insufficient documentation

## 2012-04-06 DIAGNOSIS — R1010 Upper abdominal pain, unspecified: Secondary | ICD-10-CM | POA: Insufficient documentation

## 2012-04-06 DIAGNOSIS — R109 Unspecified abdominal pain: Secondary | ICD-10-CM | POA: Diagnosis not present

## 2012-04-06 DIAGNOSIS — K5900 Constipation, unspecified: Secondary | ICD-10-CM | POA: Diagnosis not present

## 2012-04-06 DIAGNOSIS — K219 Gastro-esophageal reflux disease without esophagitis: Secondary | ICD-10-CM

## 2012-04-06 DIAGNOSIS — R1030 Lower abdominal pain, unspecified: Secondary | ICD-10-CM | POA: Insufficient documentation

## 2012-04-06 NOTE — Assessment & Plan Note (Signed)
Chronic SOB. Wheezing on exam today. Requested he discuss with his PCP and consider referral to pulmonologist.

## 2012-04-06 NOTE — Patient Instructions (Addendum)
Please have your blood work done today. We have scheduled you for a CT scan. Please see your family doctor and discuss your chronic shortness of breath. You may need to be referred to a pulmonologist (lung specialist).

## 2012-04-06 NOTE — Assessment & Plan Note (Signed)
Recommend daily fiber supplement.

## 2012-04-06 NOTE — Progress Notes (Signed)
Primary Care Physician: Colette Ribas, MD  Primary Gastroenterologist:  Roetta Sessions, MD   Chief Complaint  Patient presents with  . Abdominal Pain    HPI: John [name] is a 78 y.o. male here for further evaluation of abdominal pain. He was last seen in the office on 01/2011. H/O IDA secondary to GI bleed. Previously took Naprosyn/Aleve. Last GI Bleed in 09/2010. EGD/TCS/capsule study demonstrated gastric AVMs s/p BICAP and hemoclip. Blood tinged effluent noted in distal small bowel without obvious lesion on Given's. Therefore ileocolonoscopy done which showed bloody colonic effluent throughout colon but distal TI normal. Tagged RBC study was negative.  Two weeks ago woke up with acute onset left flank pain. Took some pain medication he had at home. Some relief. Went to PCP, on Monday due to "flu mess", felt nausea without vomiting. PCP gave shot. States he had microscopic hematuria. Still have some left flank pain and pain into front of abdomen on daily basis. Feels weak. Feels nauseated after meals. Sometimes may go several days without BM. Given Metamucil. Took one pack and BM later that day and felt better. Lot of flatus. No melena, brbpr. No heartburn. Denies fever or chills.   Current Outpatient Prescriptions  Medication Sig Dispense Refill  . amLODipine (NORVASC) 10 MG tablet Take 5 mg by mouth daily.       . Choline Fenofibrate (TRILIPIX) 135 MG capsule Take 135 mg by mouth at bedtime.       . furosemide (LASIX) 40 MG tablet Take 20 mg by mouth daily. Take one tablet one day and take one-half the next day      . HUMALOG MIX 75/25 KWIKPEN (75-25) 100 UNIT/ML SUSP 70 units in am, 60 units in pm      . iron polysaccharides (NIFEREX) 150 MG capsule Take 150 mg by mouth at bedtime.       Marland Kitchen lisinopril (PRINIVIL,ZESTRIL) 10 MG tablet 10 mg daily.      . Multiple Vitamins-Minerals (ONE-A-DAY 50 PLUS PO) Take 1 tablet by mouth at bedtime.       . nebivolol (BYSTOLIC) 10 MG tablet Take 10  mg by mouth every morning.        . pantoprazole (PROTONIX) 40 MG tablet Take 40 mg by mouth at bedtime.       . potassium chloride (K-DUR) 10 MEQ tablet Take 10 mEq by mouth daily.        . sitaGLIPtan (JANUVIA) 100 MG tablet Take 50 mg by mouth daily.        Marland Kitchen terazosin (HYTRIN) 5 MG capsule Take 5 mg by mouth at bedtime.       Marland Kitchen DISCONTD: Choline Fenofibrate (FENOFIBRIC ACID) 135 MG CPDR 135 mg daily.         Allergies as of 04/06/2012 - Review Complete 04/06/2012  Allergen Reaction Noted  . Betadine (povidone iodine)  02/13/2011  . Latex    . Povidone    . Propoxyphene hcl    . Sulfonamide derivatives      ROS:  General: Negative for anorexia, weight loss, fever, chills. C/O fatigue, weakness. ENT: Negative for hoarseness, difficulty swallowing , nasal congestion. CV: Negative for chest pain, angina, palpitations. Chronic  dyspnea on exertion. No peripheral edema.  Respiratory: Negative for dyspnea at rest. Chronic dyspnea on exertion, cough, wheezing.  GI: See history of present illness. GU:  Negative for dysuria, hematuria, urinary incontinence, urinary frequency, nocturnal urination. C/O groin pain.  Endo: Negative for unusual weight change.  Physical Examination:   BP 171/79  Pulse 77  Temp 98 F (36.7 C) (Temporal)  Ht 5\' 7"  (1.702 m)  Wt 267 lb 9.6 oz (121.383 kg)  BMI 41.91 kg/m2  General: Well-nourished, well-developed in no acute distress. Very talkative. Gets SOB after lying supine for exam.  Eyes: No icterus. Mouth: Oropharyngeal mucosa moist and pink , no lesions erythema or exudate. Lungs: Clear to auscultation bilaterally.  Heart: Regular rate and rhythm, no murmurs rubs or gallops.  Abdomen: Bowel sounds are normal, No CVA tenderness, tenderness with palpation in upper abdomen right>left, nondistended, no hepatosplenomegaly or masses, no abdominal bruits or hernia , no rebound or guarding.   Extremities: No lower extremity edema. No clubbing or  deformities. Neuro: Alert and oriented x 4   Skin: Warm and dry, no jaundice.   Psych: Alert and cooperative, normal mood and affect.

## 2012-04-06 NOTE — Assessment & Plan Note (Signed)
Recent onset Left flank pain with radiation into upper abdomen, daily for two weeks. Associated with nausea, groin pain. Intermittent constipation but reports BMs better the last one week. Etiology not clear at this time. Recently had microscopic hematuria per patient and h/o kidney stones in the past.   Check labs and CT A/P with contrast if creatinine allows.

## 2012-04-07 DIAGNOSIS — R109 Unspecified abdominal pain: Secondary | ICD-10-CM | POA: Diagnosis not present

## 2012-04-07 LAB — CBC WITH DIFFERENTIAL/PLATELET
Eosinophils Relative: 3 % (ref 0–5)
HCT: 38.6 % — ABNORMAL LOW (ref 39.0–52.0)
Hemoglobin: 13.1 g/dL (ref 13.0–17.0)
Lymphocytes Relative: 20 % (ref 12–46)
Lymphs Abs: 2 10*3/uL (ref 0.7–4.0)
MCV: 86.9 fL (ref 78.0–100.0)
Monocytes Absolute: 1.1 10*3/uL — ABNORMAL HIGH (ref 0.1–1.0)
RBC: 4.44 MIL/uL (ref 4.22–5.81)
WBC: 10.5 10*3/uL (ref 4.0–10.5)

## 2012-04-07 LAB — COMPREHENSIVE METABOLIC PANEL
ALT: 32 U/L (ref 0–53)
BUN: 30 mg/dL — ABNORMAL HIGH (ref 6–23)
CO2: 27 mEq/L (ref 19–32)
Calcium: 9.6 mg/dL (ref 8.4–10.5)
Chloride: 103 mEq/L (ref 96–112)
Creat: 2.01 mg/dL — ABNORMAL HIGH (ref 0.50–1.35)

## 2012-04-07 NOTE — Progress Notes (Signed)
Faxed to PCP

## 2012-04-08 ENCOUNTER — Ambulatory Visit (HOSPITAL_COMMUNITY)
Admission: RE | Admit: 2012-04-08 | Discharge: 2012-04-08 | Disposition: A | Discharge status: Home / Self Care | Payer: Medicare Other | Source: Ambulatory Visit | Attending: Gastroenterology | Admitting: Gastroenterology

## 2012-04-08 DIAGNOSIS — R9389 Abnormal findings on diagnostic imaging of other specified body structures: Secondary | ICD-10-CM | POA: Diagnosis not present

## 2012-04-08 DIAGNOSIS — R160 Hepatomegaly, not elsewhere classified: Secondary | ICD-10-CM | POA: Diagnosis not present

## 2012-04-08 DIAGNOSIS — R55 Syncope and collapse: Secondary | ICD-10-CM | POA: Diagnosis not present

## 2012-04-08 DIAGNOSIS — N400 Enlarged prostate without lower urinary tract symptoms: Secondary | ICD-10-CM | POA: Insufficient documentation

## 2012-04-08 DIAGNOSIS — R1032 Left lower quadrant pain: Secondary | ICD-10-CM | POA: Insufficient documentation

## 2012-04-08 MED ORDER — IOHEXOL 300 MG/ML  SOLN
80.0000 mL | Freq: Once | INTRAMUSCULAR | Status: AC | PRN
  Administered 2012-04-08: 80 mL via INTRAVENOUS

## 2012-04-11 ENCOUNTER — Telehealth: Payer: Self-pay

## 2012-04-11 NOTE — Progress Notes (Signed)
Quick Note:  Liver is enlarged (liver labs are normal) and so is prostate. Bilateral kidney cysts and to small to characterize lesions but no change in one year from past CT.  Nothing to explain abdominal pain.  Let's treat his constipation and see how abdominal pain is. Start with daily fiber supplement such as Fiberchoice two daily or Benefiber as per bottle.   OV with Dr. Jena Gauss (4 weeks) to f/u hepatomegaly and abd pain. ______

## 2012-04-11 NOTE — Progress Notes (Signed)
Results faxed to PCP and Dr Patsi Sears, appt made with RMR

## 2012-04-11 NOTE — Telephone Encounter (Signed)
Pt is still burning in his stomach. He can only eat small amounts.Please advise what he can do.

## 2012-04-11 NOTE — Progress Notes (Signed)
Quick Note:  Labs showed stable elevated Creatinine (abnormal kidney function). Would f/u with PCP or kidney specialist for this.  See CT report. ______

## 2012-04-12 NOTE — Telephone Encounter (Signed)
Other recommendations as given yesterday but let's also have him increase his Protonix to BID until he see RMR. He should keep [place] posted on how he is doing if no better.

## 2012-04-14 DIAGNOSIS — H34239 Retinal artery branch occlusion, unspecified eye: Secondary | ICD-10-CM | POA: Diagnosis not present

## 2012-04-14 DIAGNOSIS — H3530 Unspecified macular degeneration: Secondary | ICD-10-CM | POA: Diagnosis not present

## 2012-04-14 DIAGNOSIS — E119 Type 2 diabetes mellitus without complications: Secondary | ICD-10-CM | POA: Diagnosis not present

## 2012-04-14 DIAGNOSIS — H2510 Age-related nuclear cataract, unspecified eye: Secondary | ICD-10-CM | POA: Diagnosis not present

## 2012-04-20 NOTE — Telephone Encounter (Signed)
Tried to call with no answer  

## 2012-05-17 DIAGNOSIS — Z23 Encounter for immunization: Secondary | ICD-10-CM | POA: Diagnosis not present

## 2012-05-17 DIAGNOSIS — M159 Polyosteoarthritis, unspecified: Secondary | ICD-10-CM | POA: Diagnosis not present

## 2012-05-20 ENCOUNTER — Encounter: Payer: Self-pay | Admitting: Internal Medicine

## 2012-05-20 ENCOUNTER — Ambulatory Visit (INDEPENDENT_AMBULATORY_CARE_PROVIDER_SITE_OTHER): Payer: Medicare Other | Admitting: Internal Medicine

## 2012-05-20 VITALS — BP 167/81 | HR 71 | Temp 97.2°F | Ht 67.0 in | Wt 266.4 lb

## 2012-05-20 DIAGNOSIS — K5900 Constipation, unspecified: Secondary | ICD-10-CM | POA: Diagnosis not present

## 2012-05-20 DIAGNOSIS — R160 Hepatomegaly, not elsewhere classified: Secondary | ICD-10-CM | POA: Diagnosis not present

## 2012-05-20 NOTE — Progress Notes (Signed)
Primary Care Physician:  Colette Ribas, MD Primary Gastroenterologist:  Dr.   Pre-Procedure History & Physical: HPI:  John [name] is a 78 y.o. male here for followup of constipation and left flank pain. Left flank pain has resolved with the use of Metamucil. Bowel function is normal os. No melena rectal bleeding. A somewhat enlarged liver on prior CT without focal abnormality. No evidence of cirrhosis. LFTs were within normal limits. Enlarged prostate microscopic hematuria being evaluated by Dr. Patsi Sears. Hemoglobin improved into the normal range (13.1). No longer using NSAIDs.  Past Medical History  Diagnosis Date  . GI bleed     2006, TCS/TI showed small polyp. EGD showed erosive antral gastritis with two polyp, one oozing and tx. HP neg. SB capsule shoed few jejunal ulcers with bleeding (naproxen and ASA)  . GI bleed     02/2010, EGD->pancreatitc rest, fundal gland polyps,  no bleeding. TCS->blood-tinged colonic effluent throughout the colon without bleeding lesion found, nl TI. SB capsule->SB erosions, nonbleeding, incomplete study. Patient on naproxyn.   . DM (diabetes mellitus)   . Pneumonia   . Hyperlipidemia   . HTN (hypertension)   . Stroke   . GERD (gastroesophageal reflux disease)   . Poor vision     left eye  . Nephrolithiasis   . Sleep apnea   . Obesity   . Ischemic colitis, enteritis, or enterocolitis     flex sig 01/2010  . Chronic renal insufficiency   . Gastric AVM 10/24/10    GI bleed EGD w/ bicap and hemoclip placement, 2 AVMs, presented w/bright red rectal bleeding  . GI bleed 10/27/10    ileocolonoscopy/GIVENS capsule study by Dr Cori Henningsen->bloody colonic effluent throughout colon but no lesion identified, TA @ hepatic flexure, fresh blood in dital SB on capsule. Nuc med RBC study negative  . Tubular adenoma of colon 10/27/10    on colonoscopy Dr Jena Gauss    Past Surgical History  Procedure Date  . Back surgery   . Transurethral resection of prostate   .  Cataract extraction     bilateral  . Appendectomy   . Cholecystectomy   . Elbow surgery   . Esophagogastroduodenoscopy 10/23/10    Dr. Gery Pray arteriovenous malformations,GI bleed- capsule  . Colonoscopy 10/27/10    Dr. Allayne Butcher adenoma, normal rectum bloody colonic effluent throughout the colon with out bleeding lesion seen.     Prior to Admission medications   Medication Sig Start Date End Date Taking? Authorizing Provider  amLODipine (NORVASC) 10 MG tablet Take 5 mg by mouth daily.    Yes Christiane Ha, MD  Choline Fenofibrate (TRILIPIX) 135 MG capsule Take 135 mg by mouth at bedtime.    Yes Historical Provider, MD  furosemide (LASIX) 40 MG tablet Take 20 mg by mouth daily. Take one tablet one day and take one-half the next day   Yes Historical Provider, MD  HUMALOG MIX 75/25 KWIKPEN (75-25) 100 UNIT/ML SUSP 70 units in am, 60 units in pm 03/21/12  Yes Historical Provider, MD  iron polysaccharides (NIFEREX) 150 MG capsule Take 150 mg by mouth at bedtime.    Yes Historical Provider, MD  lisinopril (PRINIVIL,ZESTRIL) 10 MG tablet 10 mg daily. 03/16/12  Yes Historical Provider, MD  Multiple Vitamins-Minerals (ONE-A-DAY 50 PLUS PO) Take 1 tablet by mouth at bedtime.    Yes Historical Provider, MD  nebivolol (BYSTOLIC) 10 MG tablet Take 10 mg by mouth every morning.     Yes Historical Provider, MD  pantoprazole (PROTONIX) 40  MG tablet Take 40 mg by mouth at bedtime.    Yes Historical Provider, MD  potassium chloride (K-DUR) 10 MEQ tablet Take 10 mEq by mouth daily.     Yes Historical Provider, MD  sitaGLIPtan (JANUVIA) 100 MG tablet Take 50 mg by mouth daily.     Yes Historical Provider, MD  terazosin (HYTRIN) 5 MG capsule Take 5 mg by mouth at bedtime.    Yes Historical Provider, MD    Allergies as of 05/20/2012 - Review Complete 05/20/2012  Allergen Reaction Noted  . Betadine (povidone iodine)  02/13/2011  . Latex    . Povidone    . Propoxyphene hcl    . Sulfonamide  derivatives      Family History  Problem Relation Age of Onset  . Aneurysm Daughter     brain, deceased age 54  . GI problems Brother     gi bleed  . Cancer Sister     unknown type  . Colon cancer Neg Hx   . Liver disease Neg Hx     History   Social History  . Marital Status: Widowed    Spouse Name: N/A    Number of Children: 2  . Years of Education: N/A   Occupational History  . retired     [employer]   Social History Main Topics  . Smoking status: Never Smoker   . Smokeless tobacco: Never Used  . Alcohol Use: No  . Drug Use: No  . Sexually Active: No   Other Topics Concern  . Not on file   Social History Narrative  . No narrative on file    Review of Systems: See HPI, otherwise negative ROS  Physical Exam: BP 167/81  Pulse 71  Temp 97.2 F (36.2 C) (Temporal)  Ht 5\' 7"  (1.702 m)  Wt 266 lb 6.4 oz (120.838 kg)  BMI 41.72 kg/m2 General:   Alert, elderly obese gentleman resting comfortably. Well oriented and conversant.  Skin:  Intact without significant lesions or rashes. Eyes:  Sclera clear, no icterus.   Conjunctiva pink. Ears:  Normal auditory acuity. Nose:  No deformity, discharge,  or lesions. Mouth:  No deformity or lesions. Neck:  Supple; no masses or thyromegaly. No significant cervical adenopathy. Lungs:  Clear throughout to auscultation.   No wheezes, crackles, or rhonchi. No acute distress. Heart:  Regular rate and rhythm; no murmurs, clicks, rubs,  or gallops. Abdomen: Obese, Non-distended, normal bowel sounds.  Soft and nontender without appreciable mass or hepatosplenomegaly.  Pulses:  Normal pulses noted. Extremities:  Without clubbing or edema.  Impression/Plan:  Elderly gentleman with recent flank pain temporally associated with constipation now resolved with utilization of daily fiber supplementation. Somewhat enlarged liver without focal abnormalities on recent CT. LFTs normal. No splenomegaly. History of NSAID induced GI bleeding  doing well.  GERD symptoms well controlled on protonix.  Recommendations: Continue Metamucil daily. No further work up of hepatomegaly  at this time.  Office visit in one year and when necessary

## 2012-05-20 NOTE — Patient Instructions (Signed)
Take metamucil daily  Office visit here in 1 year

## 2012-06-02 DIAGNOSIS — E1159 Type 2 diabetes mellitus with other circulatory complications: Secondary | ICD-10-CM | POA: Diagnosis not present

## 2012-06-02 DIAGNOSIS — E119 Type 2 diabetes mellitus without complications: Secondary | ICD-10-CM | POA: Diagnosis not present

## 2012-06-16 DIAGNOSIS — M25569 Pain in unspecified knee: Secondary | ICD-10-CM | POA: Diagnosis not present

## 2012-06-16 DIAGNOSIS — M159 Polyosteoarthritis, unspecified: Secondary | ICD-10-CM | POA: Diagnosis not present

## 2012-06-16 DIAGNOSIS — M76899 Other specified enthesopathies of unspecified lower limb, excluding foot: Secondary | ICD-10-CM | POA: Diagnosis not present

## 2012-06-16 DIAGNOSIS — Z6839 Body mass index (BMI) 39.0-39.9, adult: Secondary | ICD-10-CM | POA: Diagnosis not present

## 2012-06-20 IMAGING — CT CT ABD-PELV W/O CM
2 of 4 series · 17 of 46 positions shown, 19 images · non-contrast
Comparison: CT 03/28/2010

CLINICAL DATA: Low abdominal pain and bloating., renal
insufficiency.

CT ABDOMEN AND PELVIS WITHOUT CONTRAST
TECHNIQUE: Multidetector CT imaging of the abdomen and pelvis was
performed following the standard protocol without intravenous
contrast.

[Series 2: abdomen/pelvis w/o contrast · axial · non-contrast · 0.94mm/px · z∈[-488,-62]mm · 14 of 93 slices shown, 16 images]
[im 4/93  soft-tissue]
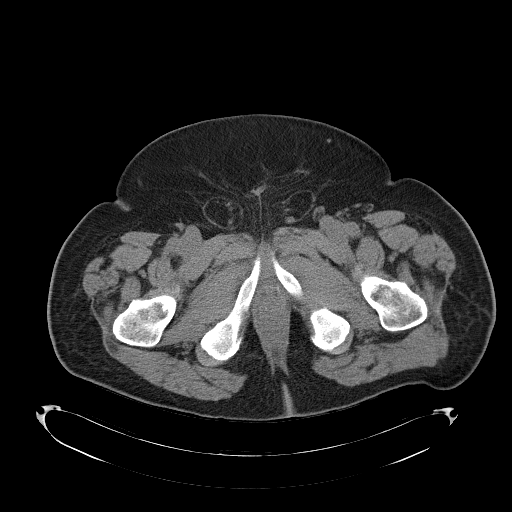
[im 4/93  bone]
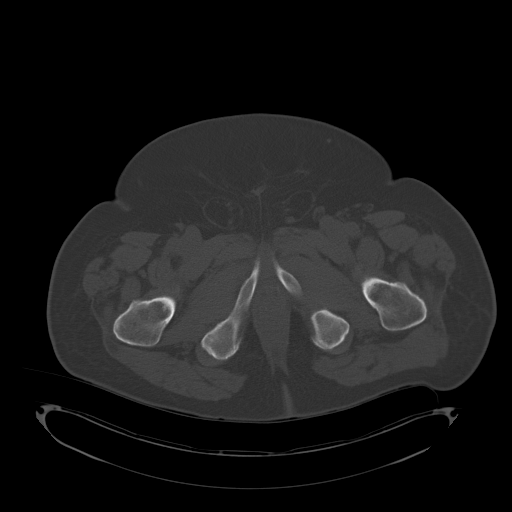
[im 11/93  soft-tissue]
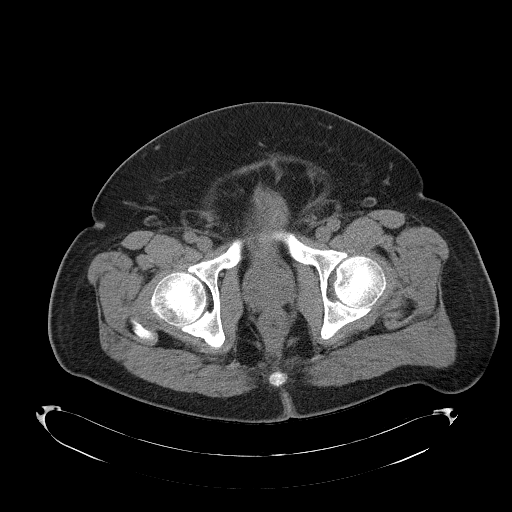
[im 18/93  soft-tissue]
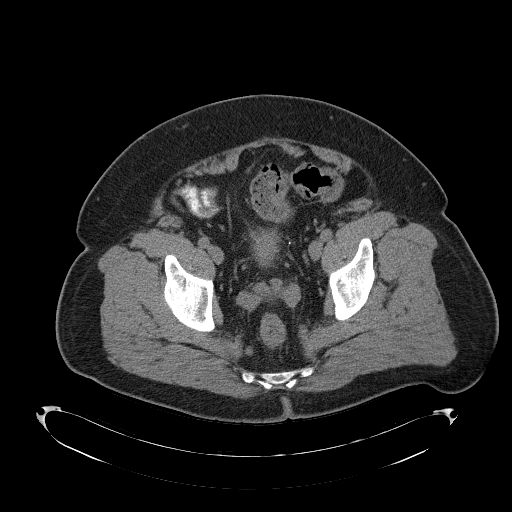
[im 25/93  soft-tissue]
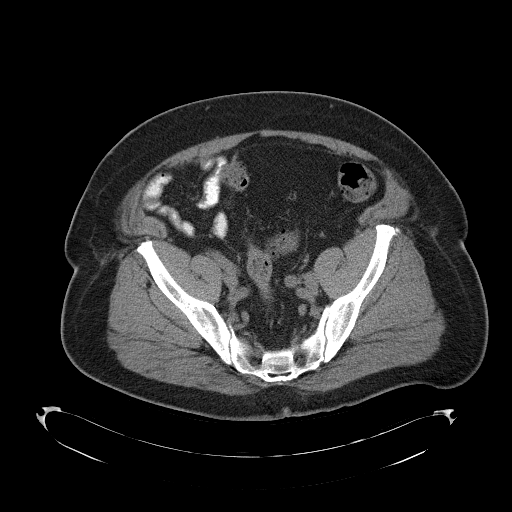
[im 32/93  soft-tissue]
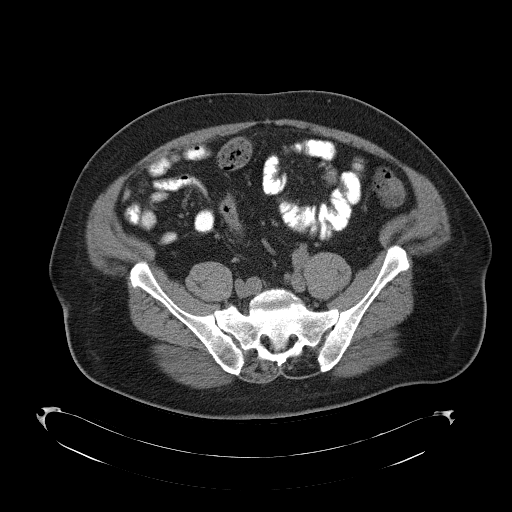
[im 36/93  soft-tissue]
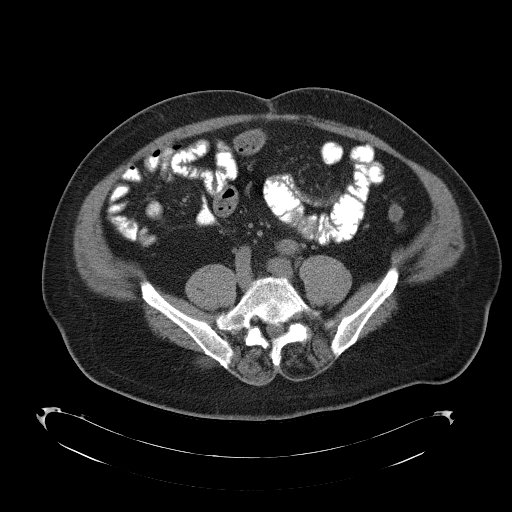
[im 43/93  soft-tissue]
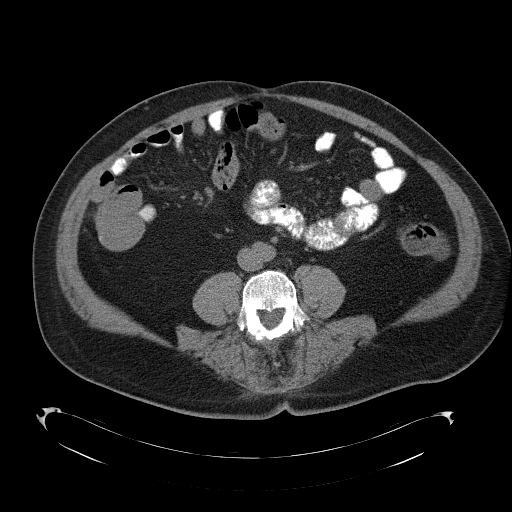
[im 50/93  soft-tissue]
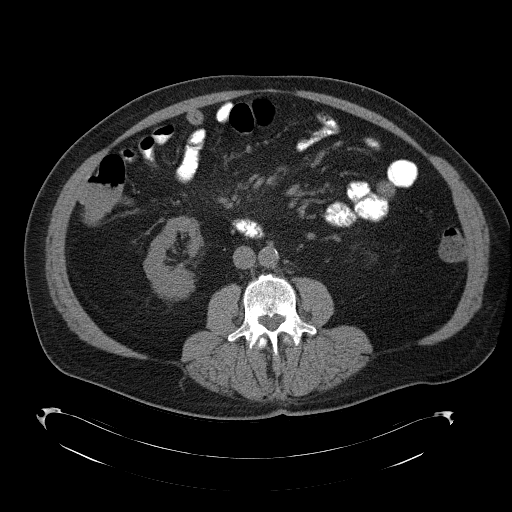
[im 57/93  soft-tissue]
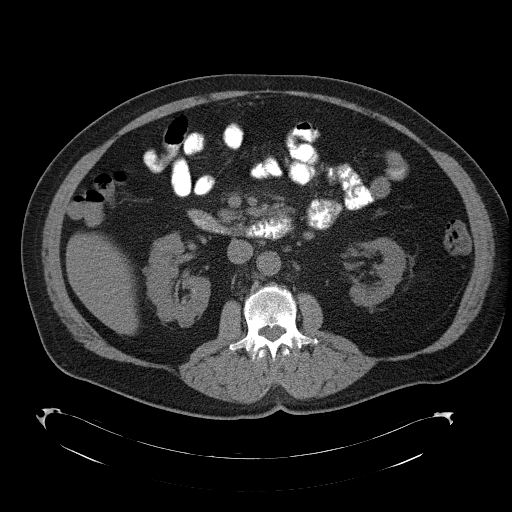
[im 57/93  bone]
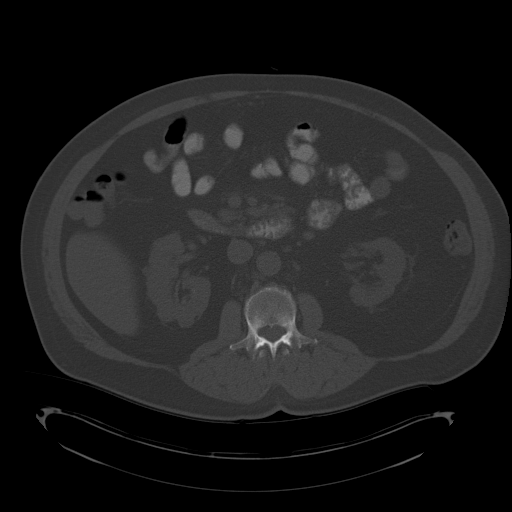
[im 61/93  soft-tissue]
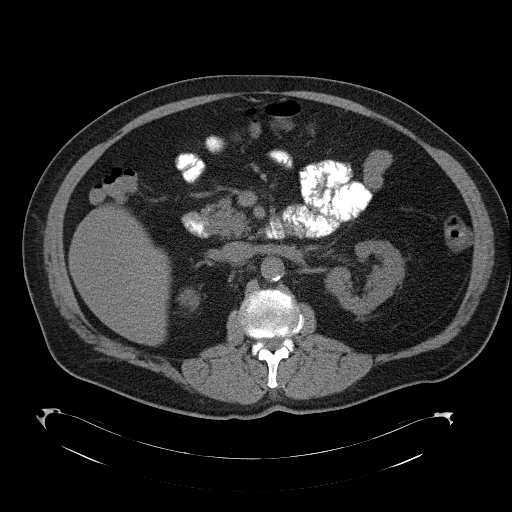
[im 68/93  soft-tissue]
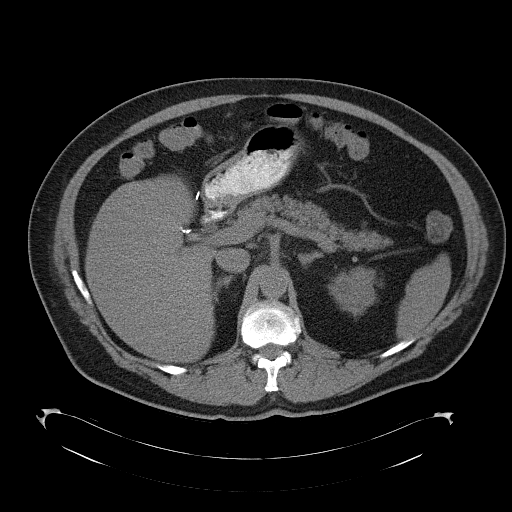
[im 75/93  soft-tissue]
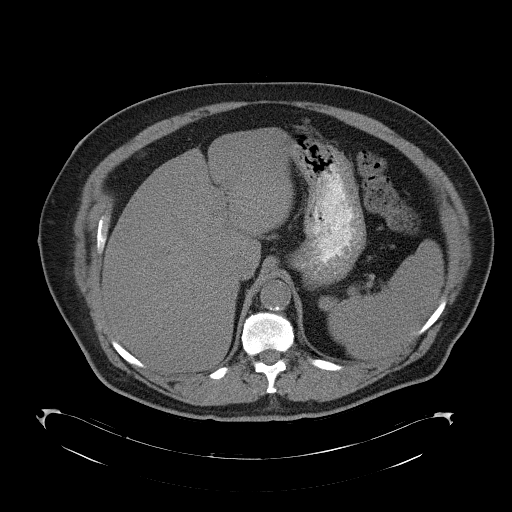
[im 82/93  soft-tissue]
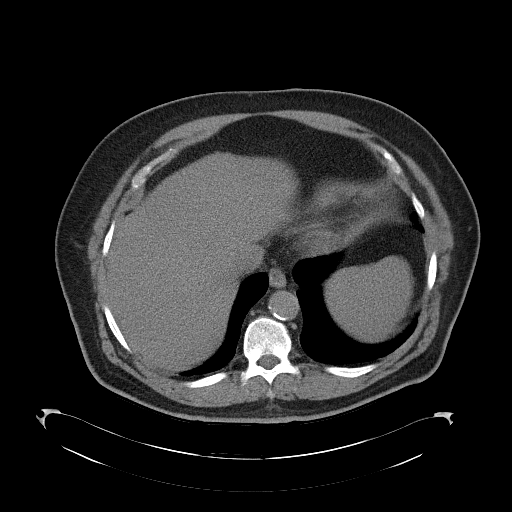
[im 89/93  soft-tissue]
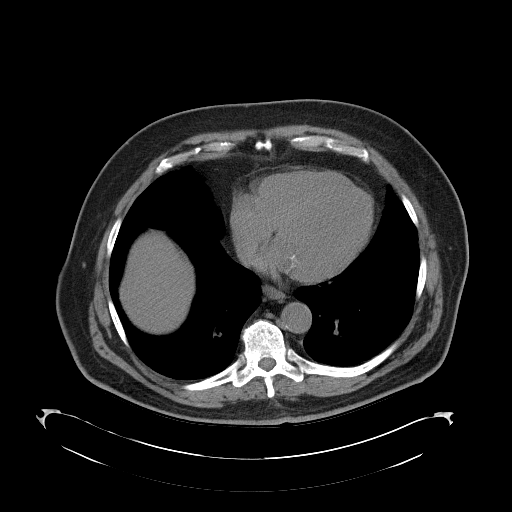

[Series 4: mpr cor (id) · coronal · 0.91mm/px · 3 of 106 slices shown]
[im 36/106  soft-tissue]
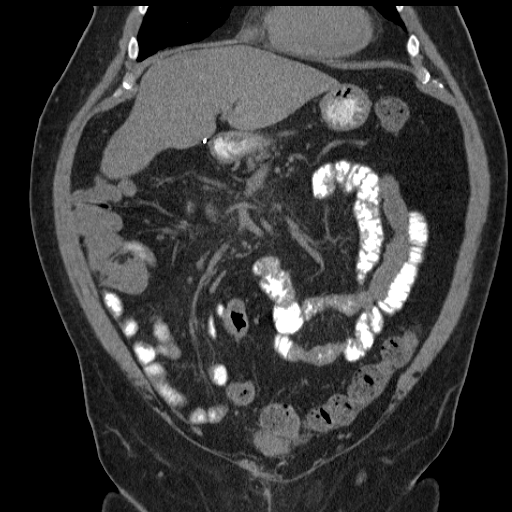
[im 47/106  soft-tissue]
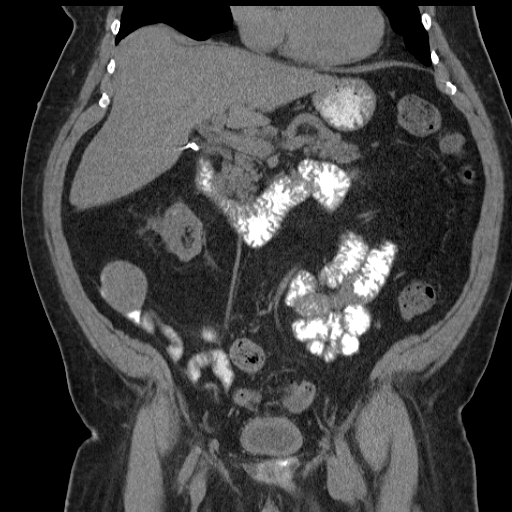
[im 59/106  soft-tissue]
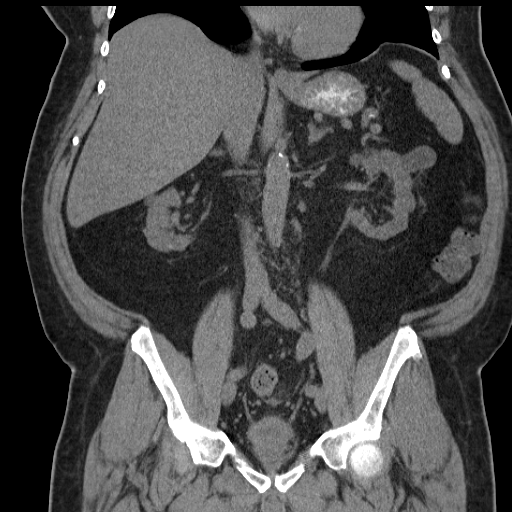

[17 of 46 positions shown; findings below may reference images not displayed]

FINDINGS: Lung bases are clear.  Non-IV contrast images demonstrate
no focal hepatic lesion.  The gallbladder is surgically absent.
The pancreas, spleen, adrenal glands, and kidneys demonstrate no
acute findings.  No evidence of ureterolithiasis or obstructive
uropathy.  There are several small cysts within both left and right
kidney which appear unchanged from prior.

The stomach, small bowel, and cecum are normal.  The colon and
rectosigmoid colon are normal.

Abdominal aorta is normal caliber.  No retroperitoneal
lymphadenopathy.

No evidence of free fluid in the pelvis.  The prostate gland
appears normal.  There is a ill-defined filling defect within the
posterior aspect of the bladder (image 79) which measures
approximately 11 mm.  This may relate to prostate hypertrophy.  No
pelvic lymphadenopathy. Review of  bone windows demonstrates no
aggressive osseous lesions.
IMPRESSION: 1.  No acute abdominal pelvic process.
2.  Small filling defect within the trigone region the bladder.
This may relate to prostate hypertrophy.  Recommend clinical
correlation with hematuria and present consider urology consult /
cystoscopy.
3.  Multiple small cysts within the kidneys appear unchanged from
prior.

## 2012-07-11 DIAGNOSIS — Z79899 Other long term (current) drug therapy: Secondary | ICD-10-CM | POA: Diagnosis not present

## 2012-07-11 DIAGNOSIS — D649 Anemia, unspecified: Secondary | ICD-10-CM | POA: Diagnosis not present

## 2012-07-11 DIAGNOSIS — I10 Essential (primary) hypertension: Secondary | ICD-10-CM | POA: Diagnosis not present

## 2012-07-11 DIAGNOSIS — R809 Proteinuria, unspecified: Secondary | ICD-10-CM | POA: Diagnosis not present

## 2012-07-11 DIAGNOSIS — E559 Vitamin D deficiency, unspecified: Secondary | ICD-10-CM | POA: Diagnosis not present

## 2012-07-11 DIAGNOSIS — N189 Chronic kidney disease, unspecified: Secondary | ICD-10-CM | POA: Diagnosis not present

## 2012-07-13 DIAGNOSIS — I10 Essential (primary) hypertension: Secondary | ICD-10-CM | POA: Diagnosis not present

## 2012-07-13 DIAGNOSIS — R809 Proteinuria, unspecified: Secondary | ICD-10-CM | POA: Diagnosis not present

## 2012-07-13 DIAGNOSIS — E559 Vitamin D deficiency, unspecified: Secondary | ICD-10-CM | POA: Diagnosis not present

## 2012-08-22 DIAGNOSIS — R809 Proteinuria, unspecified: Secondary | ICD-10-CM | POA: Diagnosis not present

## 2012-08-22 DIAGNOSIS — I10 Essential (primary) hypertension: Secondary | ICD-10-CM | POA: Diagnosis not present

## 2012-08-22 DIAGNOSIS — E7800 Pure hypercholesterolemia, unspecified: Secondary | ICD-10-CM | POA: Diagnosis not present

## 2012-09-01 DIAGNOSIS — E1159 Type 2 diabetes mellitus with other circulatory complications: Secondary | ICD-10-CM | POA: Diagnosis not present

## 2012-09-01 DIAGNOSIS — E119 Type 2 diabetes mellitus without complications: Secondary | ICD-10-CM | POA: Diagnosis not present

## 2012-09-12 DIAGNOSIS — I10 Essential (primary) hypertension: Secondary | ICD-10-CM | POA: Diagnosis not present

## 2012-09-12 DIAGNOSIS — E559 Vitamin D deficiency, unspecified: Secondary | ICD-10-CM | POA: Diagnosis not present

## 2012-09-12 DIAGNOSIS — Z79899 Other long term (current) drug therapy: Secondary | ICD-10-CM | POA: Diagnosis not present

## 2012-09-12 DIAGNOSIS — R809 Proteinuria, unspecified: Secondary | ICD-10-CM | POA: Diagnosis not present

## 2012-09-12 DIAGNOSIS — N189 Chronic kidney disease, unspecified: Secondary | ICD-10-CM | POA: Diagnosis not present

## 2012-09-12 DIAGNOSIS — D649 Anemia, unspecified: Secondary | ICD-10-CM | POA: Diagnosis not present

## 2012-09-14 DIAGNOSIS — R809 Proteinuria, unspecified: Secondary | ICD-10-CM | POA: Diagnosis not present

## 2012-09-14 DIAGNOSIS — I10 Essential (primary) hypertension: Secondary | ICD-10-CM | POA: Diagnosis not present

## 2012-09-14 DIAGNOSIS — N184 Chronic kidney disease, stage 4 (severe): Secondary | ICD-10-CM | POA: Diagnosis not present

## 2012-09-14 DIAGNOSIS — E559 Vitamin D deficiency, unspecified: Secondary | ICD-10-CM | POA: Diagnosis not present

## 2012-09-15 DIAGNOSIS — L600 Ingrowing nail: Secondary | ICD-10-CM | POA: Diagnosis not present

## 2012-09-15 DIAGNOSIS — M79609 Pain in unspecified limb: Secondary | ICD-10-CM | POA: Diagnosis not present

## 2012-09-19 DIAGNOSIS — E669 Obesity, unspecified: Secondary | ICD-10-CM | POA: Diagnosis not present

## 2012-09-19 DIAGNOSIS — I4510 Unspecified right bundle-branch block: Secondary | ICD-10-CM | POA: Diagnosis not present

## 2012-09-19 DIAGNOSIS — I10 Essential (primary) hypertension: Secondary | ICD-10-CM | POA: Diagnosis not present

## 2012-09-19 DIAGNOSIS — K219 Gastro-esophageal reflux disease without esophagitis: Secondary | ICD-10-CM | POA: Diagnosis not present

## 2012-09-20 ENCOUNTER — Telehealth: Payer: Self-pay | Admitting: Internal Medicine

## 2012-09-20 NOTE — Telephone Encounter (Signed)
Pt's daughter John [name]) is the POA and called this afternoon about Dr Alanda Amass doubling his protonix and changing around other medications. She said that Dr Alanda Amass said that patient had acid reflux and needed to see RMR, but patient said he is feeling fine and doesn't understand why he would need a GI OV. I told daughter that normally the PCP or Dr Alanda Amass would send [place] a referral if he felt that the patient needed to be seen by a GI specialist, but we have not received anything. I told her that I would be glad to make patient an appointment, but she would rather talk to the nurse first about the medications being doubled and what RMR would advise first. Please call POA John [name] at [phone]

## 2012-09-22 NOTE — Telephone Encounter (Signed)
FYI-Spoke with pts daughter- she said they went to see Dr. Alanda Amass on Monday and he wanted to increase pts protonix to bid and have an appointment with our office. She stated the pt is not having any problems with his reflux right now, she said he is doing very good with no complaints and they dont want to have to increase his medicine or bring him in unless he is having problems.   Advised her that RMR just saw pt in November 2013 and he was doing well and RMR's recommendations at that time were to continue protonix, and ov in 1 year, unless he started having problems before then. Informed her that in October of last year [facility] had ok'd pt to take protonix bid if needed. She stated she would only increase it on days that the pt was having some reflux problems.   Advised her to call us or make him an appointment if he were to develop problems. She agreed.

## 2012-09-22 NOTE — Telephone Encounter (Signed)
noted 

## 2012-09-23 DIAGNOSIS — E782 Mixed hyperlipidemia: Secondary | ICD-10-CM | POA: Diagnosis not present

## 2012-09-23 DIAGNOSIS — Z79899 Other long term (current) drug therapy: Secondary | ICD-10-CM | POA: Diagnosis not present

## 2012-09-23 DIAGNOSIS — R5381 Other malaise: Secondary | ICD-10-CM | POA: Diagnosis not present

## 2012-09-30 ENCOUNTER — Ambulatory Visit (HOSPITAL_COMMUNITY)
Admission: RE | Admit: 2012-09-30 | Discharge: 2012-09-30 | Disposition: A | Discharge status: Home / Self Care | Payer: Medicare Other | Source: Ambulatory Visit | Attending: Cardiovascular Disease | Admitting: Cardiovascular Disease

## 2012-09-30 DIAGNOSIS — R011 Cardiac murmur, unspecified: Secondary | ICD-10-CM | POA: Diagnosis not present

## 2012-09-30 DIAGNOSIS — Z8673 Personal history of transient ischemic attack (TIA), and cerebral infarction without residual deficits: Secondary | ICD-10-CM | POA: Insufficient documentation

## 2012-09-30 DIAGNOSIS — E119 Type 2 diabetes mellitus without complications: Secondary | ICD-10-CM | POA: Insufficient documentation

## 2012-09-30 DIAGNOSIS — I10 Essential (primary) hypertension: Secondary | ICD-10-CM | POA: Diagnosis not present

## 2012-09-30 DIAGNOSIS — E785 Hyperlipidemia, unspecified: Secondary | ICD-10-CM | POA: Diagnosis not present

## 2012-09-30 DIAGNOSIS — R0989 Other specified symptoms and signs involving the circulatory and respiratory systems: Secondary | ICD-10-CM | POA: Insufficient documentation

## 2012-09-30 DIAGNOSIS — R0609 Other forms of dyspnea: Secondary | ICD-10-CM | POA: Insufficient documentation

## 2012-09-30 NOTE — Progress Notes (Signed)
*  PRELIMINARY RESULTS* Echocardiogram 2D Echocardiogram has been performed.  John [name] 09/30/2012, 2:51 PM

## 2012-10-14 DIAGNOSIS — Z6839 Body mass index (BMI) 39.0-39.9, adult: Secondary | ICD-10-CM | POA: Diagnosis not present

## 2012-10-14 DIAGNOSIS — J069 Acute upper respiratory infection, unspecified: Secondary | ICD-10-CM | POA: Diagnosis not present

## 2012-10-14 DIAGNOSIS — J019 Acute sinusitis, unspecified: Secondary | ICD-10-CM | POA: Diagnosis not present

## 2012-10-19 ENCOUNTER — Encounter (HOSPITAL_COMMUNITY): Payer: Self-pay | Admitting: *Deleted

## 2012-10-19 ENCOUNTER — Inpatient Hospital Stay (HOSPITAL_COMMUNITY)
Admission: EM | Admit: 2012-10-19 | Discharge: 2012-10-24 | DRG: 291 | Disposition: A | Discharge status: Home Health | Payer: Medicare Other | Attending: Internal Medicine | Admitting: Internal Medicine

## 2012-10-19 ENCOUNTER — Emergency Department (HOSPITAL_COMMUNITY): Payer: Medicare Other

## 2012-10-19 DIAGNOSIS — J9600 Acute respiratory failure, unspecified whether with hypoxia or hypercapnia: Secondary | ICD-10-CM | POA: Diagnosis not present

## 2012-10-19 DIAGNOSIS — R0602 Shortness of breath: Secondary | ICD-10-CM

## 2012-10-19 DIAGNOSIS — N184 Chronic kidney disease, stage 4 (severe): Secondary | ICD-10-CM | POA: Diagnosis present

## 2012-10-19 DIAGNOSIS — I10 Essential (primary) hypertension: Secondary | ICD-10-CM

## 2012-10-19 DIAGNOSIS — R609 Edema, unspecified: Secondary | ICD-10-CM | POA: Diagnosis not present

## 2012-10-19 DIAGNOSIS — I509 Heart failure, unspecified: Secondary | ICD-10-CM | POA: Diagnosis present

## 2012-10-19 DIAGNOSIS — R079 Chest pain, unspecified: Secondary | ICD-10-CM | POA: Diagnosis not present

## 2012-10-19 DIAGNOSIS — K219 Gastro-esophageal reflux disease without esophagitis: Secondary | ICD-10-CM | POA: Diagnosis present

## 2012-10-19 DIAGNOSIS — Z79899 Other long term (current) drug therapy: Secondary | ICD-10-CM | POA: Diagnosis not present

## 2012-10-19 DIAGNOSIS — E785 Hyperlipidemia, unspecified: Secondary | ICD-10-CM | POA: Diagnosis not present

## 2012-10-19 DIAGNOSIS — N183 Chronic kidney disease, stage 3 unspecified: Secondary | ICD-10-CM | POA: Diagnosis present

## 2012-10-19 DIAGNOSIS — Z91040 Latex allergy status: Secondary | ICD-10-CM | POA: Diagnosis not present

## 2012-10-19 DIAGNOSIS — J90 Pleural effusion, not elsewhere classified: Secondary | ICD-10-CM | POA: Diagnosis not present

## 2012-10-19 DIAGNOSIS — R110 Nausea: Secondary | ICD-10-CM | POA: Diagnosis not present

## 2012-10-19 DIAGNOSIS — Z888 Allergy status to other drugs, medicaments and biological substances status: Secondary | ICD-10-CM

## 2012-10-19 DIAGNOSIS — D72829 Elevated white blood cell count, unspecified: Secondary | ICD-10-CM | POA: Diagnosis present

## 2012-10-19 DIAGNOSIS — R531 Weakness: Secondary | ICD-10-CM | POA: Diagnosis present

## 2012-10-19 DIAGNOSIS — Z8719 Personal history of other diseases of the digestive system: Secondary | ICD-10-CM

## 2012-10-19 DIAGNOSIS — N289 Disorder of kidney and ureter, unspecified: Secondary | ICD-10-CM | POA: Diagnosis not present

## 2012-10-19 DIAGNOSIS — Z882 Allergy status to sulfonamides status: Secondary | ICD-10-CM

## 2012-10-19 DIAGNOSIS — N400 Enlarged prostate without lower urinary tract symptoms: Secondary | ICD-10-CM | POA: Diagnosis present

## 2012-10-19 DIAGNOSIS — R1310 Dysphagia, unspecified: Secondary | ICD-10-CM | POA: Diagnosis present

## 2012-10-19 DIAGNOSIS — I5033 Acute on chronic diastolic (congestive) heart failure: Principal | ICD-10-CM | POA: Diagnosis present

## 2012-10-19 DIAGNOSIS — Z794 Long term (current) use of insulin: Secondary | ICD-10-CM | POA: Diagnosis not present

## 2012-10-19 DIAGNOSIS — R0609 Other forms of dyspnea: Secondary | ICD-10-CM | POA: Diagnosis not present

## 2012-10-19 DIAGNOSIS — J189 Pneumonia, unspecified organism: Secondary | ICD-10-CM | POA: Diagnosis present

## 2012-10-19 DIAGNOSIS — E669 Obesity, unspecified: Secondary | ICD-10-CM | POA: Diagnosis present

## 2012-10-19 DIAGNOSIS — Z6841 Body Mass Index (BMI) 40.0 and over, adult: Secondary | ICD-10-CM | POA: Diagnosis not present

## 2012-10-19 DIAGNOSIS — J9601 Acute respiratory failure with hypoxia: Secondary | ICD-10-CM | POA: Diagnosis present

## 2012-10-19 DIAGNOSIS — I129 Hypertensive chronic kidney disease with stage 1 through stage 4 chronic kidney disease, or unspecified chronic kidney disease: Secondary | ICD-10-CM | POA: Diagnosis present

## 2012-10-19 DIAGNOSIS — R0600 Dyspnea, unspecified: Secondary | ICD-10-CM | POA: Diagnosis present

## 2012-10-19 DIAGNOSIS — J111 Influenza due to unidentified influenza virus with other respiratory manifestations: Secondary | ICD-10-CM | POA: Diagnosis not present

## 2012-10-19 DIAGNOSIS — Z8673 Personal history of transient ischemic attack (TIA), and cerebral infarction without residual deficits: Secondary | ICD-10-CM

## 2012-10-19 DIAGNOSIS — E119 Type 2 diabetes mellitus without complications: Secondary | ICD-10-CM | POA: Diagnosis not present

## 2012-10-19 DIAGNOSIS — G4733 Obstructive sleep apnea (adult) (pediatric): Secondary | ICD-10-CM | POA: Diagnosis present

## 2012-10-19 DIAGNOSIS — R0989 Other specified symptoms and signs involving the circulatory and respiratory systems: Secondary | ICD-10-CM | POA: Diagnosis not present

## 2012-10-19 DIAGNOSIS — R0789 Other chest pain: Secondary | ICD-10-CM | POA: Diagnosis not present

## 2012-10-19 LAB — POCT I-STAT, CHEM 8
Calcium, Ion: 1.17 mmol/L (ref 1.13–1.30)
Creatinine, Ser: 1.9 mg/dL — ABNORMAL HIGH (ref 0.50–1.35)
Glucose, Bld: 189 mg/dL — ABNORMAL HIGH (ref 70–99)
HCT: 37 % — ABNORMAL LOW (ref 39.0–52.0)
Hemoglobin: 12.6 g/dL — ABNORMAL LOW (ref 13.0–17.0)
Potassium: 3.5 mEq/L (ref 3.5–5.1)
TCO2: 33 mmol/L (ref 0–100)

## 2012-10-19 LAB — COMPREHENSIVE METABOLIC PANEL
ALT: 30 U/L (ref 0–53)
AST: 30 U/L (ref 0–37)
Alkaline Phosphatase: 59 U/L (ref 39–117)
CO2: 32 mEq/L (ref 19–32)
Chloride: 100 mEq/L (ref 96–112)
GFR calc non Af Amer: 29 mL/min — ABNORMAL LOW (ref >90)
Potassium: 3.5 mEq/L (ref 3.5–5.1)
Sodium: 138 mEq/L (ref 135–145)
Total Bilirubin: 0.3 mg/dL (ref 0.3–1.2)

## 2012-10-19 LAB — CBC WITH DIFFERENTIAL/PLATELET
Basophils Absolute: 0 10*3/uL (ref 0.0–0.1)
HCT: 36.3 % — ABNORMAL LOW (ref 39.0–52.0)
Lymphocytes Relative: 11 % — ABNORMAL LOW (ref 12–46)
Monocytes Absolute: 0.7 10*3/uL (ref 0.1–1.0)
Neutro Abs: 9.3 10*3/uL — ABNORMAL HIGH (ref 1.7–7.7)
Neutrophils Relative %: 80 % — ABNORMAL HIGH (ref 43–77)
Platelets: 322 10*3/uL (ref 150–400)
RDW: 14.7 % (ref 11.5–15.5)
WBC: 11.6 10*3/uL — ABNORMAL HIGH (ref 4.0–10.5)

## 2012-10-19 LAB — URINALYSIS, ROUTINE W REFLEX MICROSCOPIC
Bilirubin Urine: NEGATIVE
Ketones, ur: NEGATIVE mg/dL
Nitrite: NEGATIVE
Protein, ur: 100 mg/dL — AB
Specific Gravity, Urine: 1.02 (ref 1.005–1.030)
Urobilinogen, UA: 0.2 mg/dL (ref 0.0–1.0)

## 2012-10-19 LAB — TROPONIN I: Troponin I: <0.3 ng/mL (ref <0.30)

## 2012-10-19 LAB — URINE MICROSCOPIC-ADD ON

## 2012-10-19 MED ORDER — INSULIN ASPART 100 UNIT/ML ~~LOC~~ SOLN
0.0000 [IU] | Freq: Every day | SUBCUTANEOUS | Status: DC
  Administered 2012-10-22: 2 [IU] via SUBCUTANEOUS
  Administered 2012-10-23: 3 [IU] via SUBCUTANEOUS

## 2012-10-19 MED ORDER — SODIUM CHLORIDE 0.9 % IJ SOLN
3.0000 mL | Freq: Two times a day (BID) | INTRAMUSCULAR | Status: DC
  Administered 2012-10-20 (×2): 3 mL via INTRAVENOUS

## 2012-10-19 MED ORDER — SODIUM CHLORIDE 0.9 % IJ SOLN: 3.0000 mL | INTRAMUSCULAR | Status: DC | PRN

## 2012-10-19 MED ORDER — INSULIN ASPART PROT & ASPART (70-30 MIX) 100 UNIT/ML ~~LOC~~ SUSP
SUBCUTANEOUS | Status: AC
  Filled 2012-10-19: qty 10

## 2012-10-19 MED ORDER — ALBUTEROL SULFATE (5 MG/ML) 0.5% IN NEBU: 2.5000 mg | INHALATION_SOLUTION | RESPIRATORY_TRACT | Status: DC | PRN

## 2012-10-19 MED ORDER — ASPIRIN EC 81 MG PO TBEC
81.0000 mg | DELAYED_RELEASE_TABLET | Freq: Every day | ORAL | Status: DC
  Administered 2012-10-20 – 2012-10-24 (×5): 81 mg via ORAL
  Filled 2012-10-19 (×5): qty 1

## 2012-10-19 MED ORDER — PANTOPRAZOLE SODIUM 40 MG PO TBEC
40.0000 mg | DELAYED_RELEASE_TABLET | Freq: Every day | ORAL | Status: DC
  Administered 2012-10-19 – 2012-10-23 (×5): 40 mg via ORAL
  Filled 2012-10-19 (×5): qty 1

## 2012-10-19 MED ORDER — INSULIN ASPART PROT & ASPART (70-30 MIX) 100 UNIT/ML ~~LOC~~ SUSP
20.0000 [IU] | Freq: Two times a day (BID) | SUBCUTANEOUS | Status: DC
  Administered 2012-10-20 – 2012-10-24 (×9): 20 [IU] via SUBCUTANEOUS
  Filled 2012-10-19: qty 10

## 2012-10-19 MED ORDER — NITROGLYCERIN 0.4 MG SL SUBL
0.4000 mg | SUBLINGUAL_TABLET | Freq: Once | SUBLINGUAL | Status: AC
  Administered 2012-10-19: 0.4 mg via SUBLINGUAL
  Filled 2012-10-19: qty 25

## 2012-10-19 MED ORDER — ACETAMINOPHEN 650 MG RE SUPP: 650.0000 mg | Freq: Four times a day (QID) | RECTAL | Status: DC | PRN

## 2012-10-19 MED ORDER — GUAIFENESIN ER 600 MG PO TB12
600.0000 mg | ORAL_TABLET | Freq: Two times a day (BID) | ORAL | Status: DC
  Administered 2012-10-19 – 2012-10-24 (×10): 600 mg via ORAL
  Filled 2012-10-19 (×4): qty 1
  Filled 2012-10-19: qty 2
  Filled 2012-10-19: qty 1
  Filled 2012-10-19: qty 2
  Filled 2012-10-19 (×3): qty 1

## 2012-10-19 MED ORDER — NEBIVOLOL HCL 10 MG PO TABS
10.0000 mg | ORAL_TABLET | Freq: Every day | ORAL | Status: DC
  Administered 2012-10-20 – 2012-10-24 (×5): 10 mg via ORAL
  Filled 2012-10-19 (×7): qty 1

## 2012-10-19 MED ORDER — FUROSEMIDE 10 MG/ML IJ SOLN
40.0000 mg | Freq: Once | INTRAMUSCULAR | Status: DC
  Filled 2012-10-19: qty 4

## 2012-10-19 MED ORDER — HEPARIN SODIUM (PORCINE) 5000 UNIT/ML IJ SOLN
5000.0000 [IU] | Freq: Three times a day (TID) | INTRAMUSCULAR | Status: DC
  Administered 2012-10-19 – 2012-10-24 (×14): 5000 [IU] via SUBCUTANEOUS
  Filled 2012-10-19 (×14): qty 1

## 2012-10-19 MED ORDER — ALBUTEROL SULFATE (5 MG/ML) 0.5% IN NEBU
2.5000 mg | INHALATION_SOLUTION | Freq: Four times a day (QID) | RESPIRATORY_TRACT | Status: DC
  Administered 2012-10-19 – 2012-10-24 (×16): 2.5 mg via RESPIRATORY_TRACT
  Filled 2012-10-19 (×16): qty 0.5

## 2012-10-19 MED ORDER — SODIUM CHLORIDE 0.9 % IJ SOLN
3.0000 mL | Freq: Two times a day (BID) | INTRAMUSCULAR | Status: DC
  Administered 2012-10-21 – 2012-10-24 (×6): 3 mL via INTRAVENOUS

## 2012-10-19 MED ORDER — FUROSEMIDE 10 MG/ML IJ SOLN
40.0000 mg | Freq: Once | INTRAMUSCULAR | Status: AC
  Administered 2012-10-19: 40 mg via INTRAVENOUS

## 2012-10-19 MED ORDER — ONDANSETRON HCL 4 MG/2ML IJ SOLN
4.0000 mg | Freq: Once | INTRAMUSCULAR | Status: AC
  Administered 2012-10-19: 4 mg via INTRAVENOUS
  Filled 2012-10-19: qty 2

## 2012-10-19 MED ORDER — AMLODIPINE BESYLATE 5 MG PO TABS
5.0000 mg | ORAL_TABLET | Freq: Every day | ORAL | Status: DC
  Administered 2012-10-20 – 2012-10-24 (×5): 5 mg via ORAL
  Filled 2012-10-19 (×5): qty 1

## 2012-10-19 MED ORDER — ACETAMINOPHEN 325 MG PO TABS: 650.0000 mg | ORAL_TABLET | Freq: Four times a day (QID) | ORAL | Status: DC | PRN

## 2012-10-19 MED ORDER — NYSTATIN 100000 UNIT/GM EX POWD
Freq: Three times a day (TID) | CUTANEOUS | Status: DC
  Administered 2012-10-19 – 2012-10-21 (×6): via TOPICAL
  Administered 2012-10-21: 1 g via TOPICAL
  Administered 2012-10-22: 18:00:00 via TOPICAL
  Administered 2012-10-22: 1 g via TOPICAL
  Administered 2012-10-22 – 2012-10-23 (×2): via TOPICAL
  Administered 2012-10-23: 1 g via TOPICAL
  Administered 2012-10-23 – 2012-10-24 (×2): via TOPICAL
  Filled 2012-10-19: qty 15

## 2012-10-19 MED ORDER — LEVOFLOXACIN IN D5W 750 MG/150ML IV SOLN
750.0000 mg | INTRAVENOUS | Status: DC
  Administered 2012-10-19 – 2012-10-21 (×2): 750 mg via INTRAVENOUS
  Filled 2012-10-19 (×3): qty 150

## 2012-10-19 MED ORDER — ASPIRIN 81 MG PO CHEW
324.0000 mg | CHEWABLE_TABLET | Freq: Once | ORAL | Status: AC
  Administered 2012-10-19: 324 mg via ORAL
  Filled 2012-10-19: qty 1

## 2012-10-19 MED ORDER — FUROSEMIDE 10 MG/ML IJ SOLN
80.0000 mg | Freq: Two times a day (BID) | INTRAMUSCULAR | Status: DC
  Administered 2012-10-20 (×2): 80 mg via INTRAVENOUS
  Filled 2012-10-19 (×2): qty 8
  Filled 2012-10-19: qty 4

## 2012-10-19 MED ORDER — SODIUM CHLORIDE 0.9 % IV SOLN: 250.0000 mL | INTRAVENOUS | Status: DC | PRN

## 2012-10-19 MED ORDER — ONDANSETRON HCL 4 MG PO TABS: 4.0000 mg | ORAL_TABLET | Freq: Four times a day (QID) | ORAL | Status: DC | PRN

## 2012-10-19 MED ORDER — NYSTATIN 100000 UNIT/GM EX POWD
CUTANEOUS | Status: AC
  Filled 2012-10-19: qty 15

## 2012-10-19 MED ORDER — LEVOFLOXACIN IN D5W 750 MG/150ML IV SOLN
INTRAVENOUS | Status: AC
  Filled 2012-10-19: qty 150

## 2012-10-19 MED ORDER — LISINOPRIL 5 MG PO TABS
15.0000 mg | ORAL_TABLET | Freq: Every day | ORAL | Status: DC
  Administered 2012-10-20: 15 mg via ORAL
  Filled 2012-10-19: qty 1

## 2012-10-19 MED ORDER — ONDANSETRON HCL 4 MG/2ML IJ SOLN: 4.0000 mg | Freq: Four times a day (QID) | INTRAMUSCULAR | Status: DC | PRN

## 2012-10-19 MED ORDER — INSULIN ASPART 100 UNIT/ML ~~LOC~~ SOLN
0.0000 [IU] | Freq: Three times a day (TID) | SUBCUTANEOUS | Status: DC
  Administered 2012-10-20: 2 [IU] via SUBCUTANEOUS
  Administered 2012-10-20: 3 [IU] via SUBCUTANEOUS
  Administered 2012-10-20: 2 [IU] via SUBCUTANEOUS
  Administered 2012-10-21: 3 [IU] via SUBCUTANEOUS
  Administered 2012-10-21 (×2): 2 [IU] via SUBCUTANEOUS
  Administered 2012-10-22: 5 [IU] via SUBCUTANEOUS
  Administered 2012-10-22: 8 [IU] via SUBCUTANEOUS
  Administered 2012-10-22: 3 [IU] via SUBCUTANEOUS
  Administered 2012-10-23: 8 [IU] via SUBCUTANEOUS
  Administered 2012-10-23: 4 [IU] via SUBCUTANEOUS
  Administered 2012-10-23: 5 [IU] via SUBCUTANEOUS
  Administered 2012-10-24: 2 [IU] via SUBCUTANEOUS
  Administered 2012-10-24: 3 [IU] via SUBCUTANEOUS

## 2012-10-19 NOTE — ED Provider Notes (Signed)
History  This chart was scribed for John Booze, MD by Shari Heritage and Lacey Jensen, ED Scribes. The patient was seen in room APA01/APA01. Patient's care was started at 1552.   CSN: [id number]  Arrival date & time 10/19/12  1552   First MD Initiated Contact with Patient 10/19/12 1552      Chief Complaint  Patient presents with  . Shortness of Breath    The history is provided by the patient. No language interpreter was used.    HPI Comments: John [name] is a 79 y.o. male brought in by EMS to the Emergency Department complaining of unchanged, moderate shortness of breath for the past 3 weeks. Patient also reports orthopnea, and states that shortness of breath is improved while sitting up. There is associated intermittent, mild to moderate chest tightness. Other associated symptoms include HA, urinary frequency and urinary urgency. He also reports some leg swelling but states this is baseline. Per EMS, patient had SpO2 in high 80s room air and was placed on O2 4L/min en route. Pt states that his wife passed and he lives alone. He states that he rarely has visitors. Pt denies past or current smoker.  Cardiologist - Alanda Amass PCP is [facility]or  Past Medical History  Diagnosis Date  . GI bleed     2006, TCS/TI showed small polyp. EGD showed erosive antral gastritis with two polyp, one oozing and tx. HP neg. SB capsule shoed few jejunal ulcers with bleeding (naproxen and ASA)  . GI bleed     02/2010, EGD->pancreatitc rest, fundal gland polyps,  no bleeding. TCS->blood-tinged colonic effluent throughout the colon without bleeding lesion found, nl TI. SB capsule->SB erosions, nonbleeding, incomplete study. Patient on naproxyn.   . DM (diabetes mellitus)   . Pneumonia   . Hyperlipidemia   . HTN (hypertension)   . Stroke   . GERD (gastroesophageal reflux disease)   . Poor vision     left eye  . Nephrolithiasis   . Sleep apnea   . Obesity   . Ischemic colitis, enteritis, or enterocolitis      flex sig 01/2010  . Chronic renal insufficiency   . Gastric AVM 10/24/10    GI bleed EGD w/ bicap and hemoclip placement, 2 AVMs, presented w/bright red rectal bleeding  . GI bleed 10/27/10    ileocolonoscopy/GIVENS capsule study by Dr Rourk->bloody colonic effluent throughout colon but no lesion identified, TA @ hepatic flexure, fresh blood in dital SB on capsule. Nuc med RBC study negative  . Tubular adenoma of colon 10/27/10    on colonoscopy Dr Jena Gauss    Past Surgical History  Procedure Laterality Date  . Back surgery    . Transurethral resection of prostate    . Cataract extraction      bilateral  . Appendectomy    . Cholecystectomy    . Elbow surgery    . Esophagogastroduodenoscopy  10/23/10    Dr. Gery Pray arteriovenous malformations,GI bleed- capsule  . Colonoscopy  10/27/10    Dr. Allayne Butcher adenoma, normal rectum bloody colonic effluent throughout the colon with out bleeding lesion seen.     Family History  Problem Relation Age of Onset  . Aneurysm Daughter     brain, deceased age 26  . GI problems Brother     gi bleed  . Cancer Sister     unknown type  . Colon cancer Neg Hx   . Liver disease Neg Hx     History  Substance Use Topics  .  Smoking status: Never Smoker   . Smokeless tobacco: Never Used  . Alcohol Use: No      Review of Systems  Constitutional: Negative for fever.  HENT: Negative for sore throat.   Respiratory: Positive for chest tightness.   Cardiovascular: Positive for leg swelling (Chronic).  Gastrointestinal: Positive for nausea. Negative for vomiting and abdominal pain.  Neurological: Positive for headaches.  All other systems reviewed and are negative.    Allergies  Betadine; Latex; Povidone; Propoxyphene hcl; and Sulfonamide derivatives  Home Medications   Current Outpatient Rx  Name  Route  Sig  Dispense  Refill  . amLODipine (NORVASC) 10 MG tablet   Oral   Take 5 mg by mouth daily.          . Choline Fenofibrate  (TRILIPIX) 135 MG capsule   Oral   Take 135 mg by mouth at bedtime.          . furosemide (LASIX) 40 MG tablet   Oral   Take 20 mg by mouth daily. Take one tablet one day and take one-half the next day         . HUMALOG MIX 75/25 KWIKPEN (75-25) 100 UNIT/ML SUSP      70 units in am, 60 units in pm         . iron polysaccharides (NIFEREX) 150 MG capsule   Oral   Take 150 mg by mouth at bedtime.          Marland Kitchen lisinopril (PRINIVIL,ZESTRIL) 10 MG tablet      10 mg daily.         . Multiple Vitamins-Minerals (ONE-A-DAY 50 PLUS PO)   Oral   Take 1 tablet by mouth at bedtime.          . nebivolol (BYSTOLIC) 10 MG tablet   Oral   Take 10 mg by mouth every morning.           . pantoprazole (PROTONIX) 40 MG tablet   Oral   Take 40 mg by mouth at bedtime.          . potassium chloride (K-DUR) 10 MEQ tablet   Oral   Take 10 mEq by mouth daily.           . sitaGLIPtan (JANUVIA) 100 MG tablet   Oral   Take 50 mg by mouth daily.           Marland Kitchen terazosin (HYTRIN) 5 MG capsule   Oral   Take 5 mg by mouth at bedtime.            Triage Vitals: Pulse 78  Temp(Src) 98.5 F (36.9 C) (Oral)  Resp 20  Ht 5\' 7"  (1.702 m)  Wt 268 lb (121.564 kg)  BMI 41.96 kg/m2  SpO2 95%  Physical Exam  Constitutional: He is oriented to person, place, and time. He appears well-developed and well-nourished.  Obese  HENT:  Head: Normocephalic and atraumatic.  Mouth/Throat: Oropharynx is clear and moist.  Eyes: Conjunctivae and EOM are normal. Pupils are equal, round, and reactive to light.  Neck: Normal range of motion. Neck supple.  Pulmonary/Chest: Effort normal. No respiratory distress. He has decreased breath sounds. He has rales.  Diminshed air flow diffusely. Rales at both bases.  Abdominal: Soft.  Musculoskeletal: He exhibits edema.  3+ pretibial edema. 1+ presacral edema.  Neurological: He is alert and oriented to person, place, and time.  Skin: Skin is warm and dry.     ED Course  Procedures (  including critical care time) DIAGNOSTIC STUDIES: Oxygen Saturation is 95% on room air, adequate by my interpretation.    COORDINATION OF CARE: 4:12 PM- Patient informed of current plan for treatment and evaluation and agrees with plan at this time.   Results for orders placed during the hospital encounter of 10/19/12  CBC WITH DIFFERENTIAL      Result Value Range   WBC 11.6 (*) 4.0 - 10.5 K/uL   RBC 4.06 (*) 4.22 - 5.81 MIL/uL   Hemoglobin 12.0 (*) 13.0 - 17.0 g/dL   HCT 16.1 (*) 09.6 - 04.5 %   MCV 89.4  78.0 - 100.0 fL   MCH 29.6  26.0 - 34.0 pg   MCHC 33.1  30.0 - 36.0 g/dL   RDW 40.9  81.1 - 91.4 %   Platelets 322  150 - 400 K/uL   Neutrophils Relative 80 (*) 43 - 77 %   Neutro Abs 9.3 (*) 1.7 - 7.7 K/uL   Lymphocytes Relative 11 (*) 12 - 46 %   Lymphs Abs 1.3  0.7 - 4.0 K/uL   Monocytes Relative 6  3 - 12 %   Monocytes Absolute 0.7  0.1 - 1.0 K/uL   Eosinophils Relative 2  0 - 5 %   Eosinophils Absolute 0.2  0.0 - 0.7 K/uL   Basophils Relative 0  0 - 1 %   Basophils Absolute 0.0  0.0 - 0.1 K/uL  COMPREHENSIVE METABOLIC PANEL      Result Value Range   Sodium 138  135 - 145 mEq/L   Potassium 3.5  3.5 - 5.1 mEq/L   Chloride 100  96 - 112 mEq/L   CO2 32  19 - 32 mEq/L   Glucose, Bld 192 (*) 70 - 99 mg/dL   BUN 26 (*) 6 - 23 mg/dL   Creatinine, Ser 7.82 (*) 0.50 - 1.35 mg/dL   Calcium 9.2  8.4 - 95.6 mg/dL   Total Protein 6.8  6.0 - 8.3 g/dL   Albumin 3.2 (*) 3.5 - 5.2 g/dL   AST 30  0 - 37 U/L   ALT 30  0 - 53 U/L   Alkaline Phosphatase 59  39 - 117 U/L   Total Bilirubin 0.3  0.3 - 1.2 mg/dL   GFR calc non Af Amer 29 (*) >90 mL/min   GFR calc Af Amer 34 (*) >90 mL/min  PRO B NATRIURETIC PEPTIDE      Result Value Range   Pro B Natriuretic peptide (BNP) 655.6 (*) 0 - 450 pg/mL  URINALYSIS, ROUTINE W REFLEX MICROSCOPIC      Result Value Range   Color, Urine YELLOW  YELLOW   APPearance CLEAR  CLEAR   Specific Gravity, Urine 1.020   1.005 - 1.030   pH 6.0  5.0 - 8.0   Glucose, UA NEGATIVE  NEGATIVE mg/dL   Hgb urine dipstick SMALL (*) NEGATIVE   Bilirubin Urine NEGATIVE  NEGATIVE   Ketones, ur NEGATIVE  NEGATIVE mg/dL   Protein, ur 213 (*) NEGATIVE mg/dL   Urobilinogen, UA 0.2  0.0 - 1.0 mg/dL   Nitrite NEGATIVE  NEGATIVE   Leukocytes, UA NEGATIVE  NEGATIVE  URINE MICROSCOPIC-ADD ON      Result Value Range   Squamous Epithelial / LPF RARE  RARE   WBC, UA 11-20  <3 WBC/hpf   RBC / HPF 3-6  <3 RBC/hpf   Bacteria, UA RARE  RARE  POCT I-STAT, CHEM 8      Result  Value Range   Sodium 142  135 - 145 mEq/L   Potassium 3.5  3.5 - 5.1 mEq/L   Chloride 104  96 - 112 mEq/L   BUN 25 (*) 6 - 23 mg/dL   Creatinine, Ser 9.60 (*) 0.50 - 1.35 mg/dL   Glucose, Bld 454 (*) 70 - 99 mg/dL   Calcium, Ion 0.98  1.19 - 1.30 mmol/L   TCO2 33  0 - 100 mmol/L   Hemoglobin 12.6 (*) 13.0 - 17.0 g/dL   HCT 14.7 (*) 82.9 - 56.2 %  POCT I-STAT TROPONIN I      Result Value Range   Troponin i, poc 0.04  0.00 - 0.08 ng/mL   Comment 3            Dg Chest 2 View  10/19/2012  *RADIOLOGY REPORT*  Clinical Data: 79 year old male with shortness of breath. Sleep apnea.  Diabetes.  CHEST - 2 VIEW  Comparison: 02/18/2011.  Findings: Lower lung volumes.  Patchy bibasilar opacity.  Cannot exclude small pleural effusions.  Pulmonary vascularity mildly increased.  No overt edema.  Stable mild cardiomegaly. Visualized tracheal air column is within normal limits.  IMPRESSION: Lower lung volumes with left greater than right bibasilar pulmonary opacity.  Favor atelectasis but left lower lobe pneumonia not excluded.  Small pleural effusions and increased vascular congestion.   Original Report Authenticated By: Erskine Speed, M.D.     Images viewed by me.   Date: 10/19/2012  Rate: 79  Rhythm: normal sinus rhythm  QRS Axis: right  Intervals: normal  ST/T Wave abnormalities: normal  Conduction Disutrbances:right bundle branch block  Narrative  Interpretation: Right bundle branch block with right axis deviation. When compared with ECG of 02/13/2011, no significant changes are seen.  Old EKG Reviewed: unchanged    1. CHF (congestive heart failure)   2. Renal insufficiency       MDM  Dyspnea which seems most consistent with CHF. Chest x-ray will be obtained as well as a BNP level. Old records are reviewed and he was seen in a gastroenterologist office in November with reported no edema. 2-D echocardiogram done yesterday showed grade 1 diastolic dysfunction. Laboratory workup is significant for renal insufficiency which is stable and elevated BNP. Case is discussed with Dr. Sherrie Mustache of [facility] who agrees to admit him to telemetry bed.   I personally performed the services described in this documentation, which was scribed in my presence. The recorded information has been reviewed and is accurate.     John Booze, MD 10/19/12 [phone]

## 2012-10-19 NOTE — H&P (Signed)
[facility] History and Physical  John [name] RUE:[id number] DOB: 11/01/1930 DOA: 10/19/2012   PCP: Colette Ribas, MD  Specialists: He is followed by Dr. Alanda Amass. He's also followed by Dr. Raj Janus, gastroenterology  Chief Complaint: Shortness of breath for the last 3 weeks  HPI: John [name] is a 79 y.o. male with a past medical history of, diabetes, on insulin, chronic GI bleeding, obesity, hypertension, obstructive sleep apnea, diastolic congestive heart failure, chronic kidney disease, stage III, who was in his usual state of health about 3 weeks ago, when he started developing a cough. Patient is a poor historian. Wasn't able to give specifics regarding his symptoms. He mentioned that he was able to cough up some sputum which was yellowish in color initially, and subsequently, greenish. Denies any blood in the sputum. Also has noticed leg swelling. However, he tells me, that it is no worse than normal. Has had fever and chills. However, did not check his temperature. Has had profound weakness. He also developed shortness of breath, initially with exertion, and, subsequently, even at rest. He had wheezing as well. He had chest pain across his chest for the last 3 weeks on and off. Unable to describe it any further. He went to see his PCP about 2 weeks ago, and he was prescribed antibiotics. He does not know the name of the antibiotic. Subsequently, after he finished that course he was prescribed Augmentin. He continues to take the antibiotic. He tells me that the symptoms have somewhat improved but the shortness of breath persists. He's had nausea without any vomiting. He feels sometimes that the food would not go down when he tries to swallow. He has received Lasix in the emergency department and hasn't experienced much improvement. He was found to be hypoxic with saturations in the mid-80s which improved with application of oxygen. He denies being on home oxygenation.  Home  Medications: Prior to Admission medications   Medication Sig Start Date End Date Taking? Authorizing Provider  amLODipine (NORVASC) 10 MG tablet Take 5 mg by mouth daily.    Yes Christiane Ha, MD  amoxicillin-clavulanate (AUGMENTIN) 875-125 MG per tablet Take 1 tablet by mouth 2 (two) times daily. Starting 10/14/2012 for 10 days.   Yes Historical Provider, MD  benzonatate (TESSALON) 200 MG capsule Take 200 mg by mouth 3 (three) times daily as needed for cough. Starting 10/14/2012 for 10 days.   Yes Historical Provider, MD  Cholecalciferol (VITAMIN D) 2000 UNITS CAPS Take 1 capsule by mouth daily.   Yes Historical Provider, MD  Choline Fenofibrate (TRILIPIX) 135 MG capsule Take 135 mg by mouth at bedtime.    Yes Historical Provider, MD  ferrous sulfate 325 (65 FE) MG tablet Take 325 mg by mouth daily with breakfast.   Yes Historical Provider, MD  furosemide (LASIX) 40 MG tablet Take 20 mg by mouth 2 (two) times daily.    Yes Historical Provider, MD  HUMALOG MIX 75/25 KWIKPEN (75-25) 100 UNIT/ML SUSP 60-70 Units 2 (two) times daily with a meal. 70 units in am, 60 units in pm 03/21/12  Yes Historical Provider, MD  lisinopril (PRINIVIL,ZESTRIL) 10 MG tablet Take 15 mg by mouth daily.  03/16/12  Yes Historical Provider, MD  Multiple Vitamins-Minerals (ONE-A-DAY 50 PLUS PO) Take 1 tablet by mouth at bedtime.    Yes Historical Provider, MD  nebivolol (BYSTOLIC) 10 MG tablet Take 10 mg by mouth every morning.     Yes Historical Provider, MD  pantoprazole (PROTONIX)  40 MG tablet Take 40 mg by mouth at bedtime.    Yes Historical Provider, MD    Allergies:  Allergies  Allergen Reactions  . Betadine (Povidone Iodine)   . Latex   . Povidone   . Propoxyphene Hcl   . Sulfonamide Derivatives     Past Medical History: Past Medical History  Diagnosis Date  . GI bleed     2006, TCS/TI showed small polyp. EGD showed erosive antral gastritis with two polyp, one oozing and tx. HP neg. SB capsule shoed few  jejunal ulcers with bleeding (naproxen and ASA)  . GI bleed     02/2010, EGD->pancreatitc rest, fundal gland polyps,  no bleeding. TCS->blood-tinged colonic effluent throughout the colon without bleeding lesion found, nl TI. SB capsule->SB erosions, nonbleeding, incomplete study. Patient on naproxyn.   . DM (diabetes mellitus)   . Pneumonia   . Hyperlipidemia   . HTN (hypertension)   . Stroke   . GERD (gastroesophageal reflux disease)   . Poor vision     left eye  . Nephrolithiasis   . Sleep apnea   . Obesity   . Ischemic colitis, enteritis, or enterocolitis     flex sig 01/2010  . Chronic renal insufficiency   . Gastric AVM 10/24/10    GI bleed EGD w/ bicap and hemoclip placement, 2 AVMs, presented w/bright red rectal bleeding  . GI bleed 10/27/10    ileocolonoscopy/GIVENS capsule study by Dr Rourk->bloody colonic effluent throughout colon but no lesion identified, TA @ hepatic flexure, fresh blood in dital SB on capsule. Nuc med RBC study negative  . Tubular adenoma of colon 10/27/10    on colonoscopy Dr Jena Gauss    Past Surgical History  Procedure Laterality Date  . Back surgery    . Transurethral resection of prostate    . Cataract extraction      bilateral  . Appendectomy    . Cholecystectomy    . Elbow surgery    . Esophagogastroduodenoscopy  10/23/10    Dr. Gery Pray arteriovenous malformations,GI bleed- capsule  . Colonoscopy  10/27/10    Dr. Allayne Butcher adenoma, normal rectum bloody colonic effluent throughout the colon with out bleeding lesion seen.     Social History:  reports that he has never smoked. He has never used smokeless tobacco. He reports that he does not drink alcohol or use illicit drugs.  Living Situation: He lives in [place] by himself Activity Level: Usually independent with daily activities   Family History:  Family History  Problem Relation Age of Onset  . Aneurysm Daughter     brain, deceased age 61  . GI problems Brother     gi bleed   . Cancer Sister     unknown type  . Colon cancer Neg Hx   . Liver disease Neg Hx      Review of Systems - History obtained from the patient General ROS: positive for  - fatigue Psychological ROS: negative Ophthalmic ROS: negative ENT ROS: negative Allergy and Immunology ROS: negative Hematological and Lymphatic ROS: negative Endocrine ROS: negative Respiratory ROS: as in hpi Cardiovascular ROS: as in hpi Gastrointestinal ROS: as in hpi Genito-Urinary ROS: no dysuria, trouble voiding, or hematuria Musculoskeletal ROS: negative Neurological ROS: no TIA or stroke symptoms Dermatological ROS: negative  Physical Examination  Filed Vitals:   10/19/12 1800 10/19/12 1803 10/19/12 1900 10/19/12 2000  BP: 165/129 169/77 176/92 157/75  Pulse: 72 73 69 71  Temp:      TempSrc:  Resp:  22    Height:      Weight:      SpO2: 93% 95% 92% 94%    General appearance: alert, cooperative, appears stated age, no distress and moderately obese Head: Normocephalic, without obvious abnormality, atraumatic Eyes: conjunctivae/corneas clear. PERRL, EOM's intact. Throat: lips, mucosa, and tongue normal; teeth and gums normal Neck: no adenopathy, no carotid bruit, no JVD, supple, symmetrical, trachea midline and thyroid not enlarged, symmetric, no tenderness/mass/nodules Back: symmetric, no curvature. ROM normal. No CVA tenderness. Resp: Decreased air entry at the bases with a few end expiratory wheezes bilaterally. Few crackles at the bases as well. The right more than the left Cardio: regular rate and rhythm, S1, S2 normal, no murmur, click, rub or gallop GI: soft, non-tender; bowel sounds normal; no masses,  no organomegaly Extremities: edema 2+ pitting edema. Bilateral lower extremities. No specific erythema. Pulses: 2+ and symmetric Skin: Erythematous rash in the lower abdominal force with a central clearing. Lymph nodes: Cervical, supraclavicular, and axillary nodes normal. Neurologic:  He is alert and oriented x3. No focal neurological deficits are present  Laboratory Data: Results for orders placed during the hospital encounter of 10/19/12 (from the past 48 hour(s))  CBC WITH DIFFERENTIAL     Status: Abnormal   Collection Time    10/19/12  4:18 PM      Result Value Range   WBC 11.6 (*) 4.0 - 10.5 K/uL   RBC 4.06 (*) 4.22 - 5.81 MIL/uL   Hemoglobin 12.0 (*) 13.0 - 17.0 g/dL   HCT 44.0 (*) 10.2 - 72.5 %   MCV 89.4  78.0 - 100.0 fL   MCH 29.6  26.0 - 34.0 pg   MCHC 33.1  30.0 - 36.0 g/dL   RDW 36.6  44.0 - 34.7 %   Platelets 322  150 - 400 K/uL   Neutrophils Relative 80 (*) 43 - 77 %   Neutro Abs 9.3 (*) 1.7 - 7.7 K/uL   Lymphocytes Relative 11 (*) 12 - 46 %   Lymphs Abs 1.3  0.7 - 4.0 K/uL   Monocytes Relative 6  3 - 12 %   Monocytes Absolute 0.7  0.1 - 1.0 K/uL   Eosinophils Relative 2  0 - 5 %   Eosinophils Absolute 0.2  0.0 - 0.7 K/uL   Basophils Relative 0  0 - 1 %   Basophils Absolute 0.0  0.0 - 0.1 K/uL  COMPREHENSIVE METABOLIC PANEL     Status: Abnormal   Collection Time    10/19/12  4:18 PM      Result Value Range   Sodium 138  135 - 145 mEq/L   Potassium 3.5  3.5 - 5.1 mEq/L   Chloride 100  96 - 112 mEq/L   CO2 32  19 - 32 mEq/L   Glucose, Bld 192 (*) 70 - 99 mg/dL   BUN 26 (*) 6 - 23 mg/dL   Creatinine, Ser 4.25 (*) 0.50 - 1.35 mg/dL   Calcium 9.2  8.4 - 95.6 mg/dL   Total Protein 6.8  6.0 - 8.3 g/dL   Albumin 3.2 (*) 3.5 - 5.2 g/dL   AST 30  0 - 37 U/L   ALT 30  0 - 53 U/L   Alkaline Phosphatase 59  39 - 117 U/L   Total Bilirubin 0.3  0.3 - 1.2 mg/dL   GFR calc non Af Amer 29 (*) >90 mL/min   GFR calc Af Amer 34 (*) >90 mL/min  Comment:            The eGFR has been calculated     using the CKD EPI equation.     This calculation has not been     validated in all clinical     situations.     eGFR's persistently     <90 mL/min signify     possible Chronic Kidney Disease.  PRO B NATRIURETIC PEPTIDE     Status: Abnormal   Collection  Time    10/19/12  4:18 PM      Result Value Range   Pro B Natriuretic peptide (BNP) 655.6 (*) 0 - 450 pg/mL  POCT I-STAT, CHEM 8     Status: Abnormal   Collection Time    10/19/12  4:43 PM      Result Value Range   Sodium 142  135 - 145 mEq/L   Potassium 3.5  3.5 - 5.1 mEq/L   Chloride 104  96 - 112 mEq/L   BUN 25 (*) 6 - 23 mg/dL   Creatinine, Ser 1.61 (*) 0.50 - 1.35 mg/dL   Glucose, Bld 096 (*) 70 - 99 mg/dL   Calcium, Ion 0.45  4.09 - 1.30 mmol/L   TCO2 33  0 - 100 mmol/L   Hemoglobin 12.6 (*) 13.0 - 17.0 g/dL   HCT 81.1 (*) 91.4 - 78.2 %  POCT I-STAT TROPONIN I     Status: None   Collection Time    10/19/12  4:46 PM      Result Value Range   Troponin i, poc 0.04  0.00 - 0.08 ng/mL   Comment 3            Comment: Due to the release kinetics of cTnI,     a negative result within the first hours     of the onset of symptoms does not rule out     myocardial infarction with certainty.     If myocardial infarction is still suspected,     repeat the test at appropriate intervals.  URINALYSIS, ROUTINE W REFLEX MICROSCOPIC     Status: Abnormal   Collection Time    10/19/12  5:50 PM      Result Value Range   Color, Urine YELLOW  YELLOW   APPearance CLEAR  CLEAR   Specific Gravity, Urine 1.020  1.005 - 1.030   pH 6.0  5.0 - 8.0   Glucose, UA NEGATIVE  NEGATIVE mg/dL   Hgb urine dipstick SMALL (*) NEGATIVE   Bilirubin Urine NEGATIVE  NEGATIVE   Ketones, ur NEGATIVE  NEGATIVE mg/dL   Protein, ur 956 (*) NEGATIVE mg/dL   Urobilinogen, UA 0.2  0.0 - 1.0 mg/dL   Nitrite NEGATIVE  NEGATIVE   Leukocytes, UA NEGATIVE  NEGATIVE  URINE MICROSCOPIC-ADD ON     Status: None   Collection Time    10/19/12  5:50 PM      Result Value Range   Squamous Epithelial / LPF RARE  RARE   WBC, UA 11-20  <3 WBC/hpf   RBC / HPF 3-6  <3 RBC/hpf   Bacteria, UA RARE  RARE    Radiology Reports: Dg Chest 2 View  10/19/2012  *RADIOLOGY REPORT*  Clinical Data: 79 year old male with shortness of  breath. Sleep apnea.  Diabetes.  CHEST - 2 VIEW  Comparison: 02/18/2011.  Findings: Lower lung volumes.  Patchy bibasilar opacity.  Cannot exclude small pleural effusions.  Pulmonary vascularity mildly increased.  No overt edema.  Stable  mild cardiomegaly. Visualized tracheal air column is within normal limits.  IMPRESSION: Lower lung volumes with left greater than right bibasilar pulmonary opacity.  Favor atelectasis but left lower lobe pneumonia not excluded.  Small pleural effusions and increased vascular congestion.   Original Report Authenticated By: Erskine Speed, M.D.     Electrocardiogram: EKG shows sinus rhythm at 79 beats per minute. Normal axis. He does have a right bundle branch block. Nonspecific T-wave changes are noted. This was compared to EKG from 2012 and no significant changes are noted.  Problem List  Principal Problem:   Dyspnea Active Problems:   HYPERTENSION   GASTROINTESTINAL HEMORRHAGE, HX OF   Weakness generalized   CKD (chronic kidney disease), stage III   BPH (benign prostatic hypertrophy)   DM type 2 (diabetes mellitus, type 2)   Diastolic CHF, chronic   Assessment: This is a 79 year old, Caucasian male, who presents with shortness of breath, and cough for the last 3 weeks. His x-ray suggests possibility of pneumonia. He has lower extremity edema, which points to possible congestive heart failure. Venous thromboembolism is another differential, though less likely.  Plan: #1 dyspnea: He'll be treated with intravenous antibiotics. He will be given Nebulizer treatments for his wheezing. He has been given a dose of Lasix. However, will require higher dose due to chronic kidney disease. Venous Dopplers will be obtained. Strict ins and outs and daily weights will be measured. Chest x-ray will have to be repeated when he is adequately treated. Troponins will be checked.  #2 diabetes mellitus type 2, on insulin: Continue with sliding scale coverage. Continue with this  insulin regimen, but at a lower dose. HbA1c will be checked.  #3 history of chronic kidney disease, stage III: Renal function appears to be close to baseline. Continue to monitor closely while he is on diuretics.  #4 history of chronic congestive heart failure, which is diastolic: He had an echocardiogram recently. Report is available in the chart. Systolic function was greater than 55%. Continue with his medications.  #5 history of obstructive sleep: Continue with CPAP.  #6 history of difficulty swallowing: He is followed by GI for history of chronic GI bleeding. He has had EGDs in the past. Continue to monitor. Once his  respiratory symptoms have improved an Esophagogram could be considered. Continue with PPI for now.  #7 history of hypertension: Continue with current medications  #8 chest pain: Most likely related to his respiratory issues. Troponin is normal. EKG does not show any ischemic findings. Continue to monitor for now. Obtain VQ scan.   DVT Prophylaxis: We will utilize subcutaneous heparin. He does not have any active bleeding at this time and no recent history of bleeding. Continue to monitor him closely Code Status: Full code Family Communication: Discussed with the patient and his brother  Disposition Plan: Admit to telemetry. PTOT will be consulted.   Further management decisions will depend on results of further testing and patient's response to treatment.  [facility]  [facility] Pager [phone]  If 7PM-7AM, please contact night-coverage [email or web address] Password [email or web address]  10/19/2012, 8:16 PM

## 2012-10-19 NOTE — ED Notes (Signed)
C/o shortness of breath x 3 weeks, progressively worsening. Per EMS, pt had SpO2 in high 80s RA; placed on O2 4L/min per EMS. A&ox4; answers questions appropriately.

## 2012-10-19 NOTE — Progress Notes (Addendum)
ANTIBIOTIC CONSULT NOTE - INITIAL  Pharmacy Consult for Levaquin Indication: pneumonia  Allergies  Allergen Reactions  . Betadine (Povidone Iodine)   . Latex   . Povidone   . Propoxyphene Hcl   . Sulfonamide Derivatives     Patient Measurements: Height: 5\' 7"  (170.2 cm) Weight: 263 lb 3.7 oz (119.4 kg) IBW/kg (Calculated) : 66.1  Vital Signs: Temp: 97.7 F (36.5 C) (04/23 2033) Temp src: Oral (04/23 2033) BP: 169/69 mmHg (04/23 2033) Pulse Rate: 76 (04/23 2033) Intake/Output from previous day:   Intake/Output from this shift:    Labs:  Recent Labs  10/19/12 1618 10/19/12 1643  WBC 11.6*  --   HGB 12.0* 12.6*  PLT 322  --   CREATININE 2.06* 1.90*   Estimated Creatinine Clearance: 39 ml/min (by C-G formula based on Cr of 1.9). No results found for this basename: VANCOTROUGH, VANCOPEAK, VANCORANDOM, GENTTROUGH, GENTPEAK, GENTRANDOM, TOBRATROUGH, TOBRAPEAK, TOBRARND, AMIKACINPEAK, AMIKACINTROU, AMIKACIN,  in the last 72 hours   Microbiology: No results found for this or any previous visit (from the past 720 hour(s)).  Medical History: Past Medical History  Diagnosis Date  . GI bleed     2006, TCS/TI showed small polyp. EGD showed erosive antral gastritis with two polyp, one oozing and tx. HP neg. SB capsule shoed few jejunal ulcers with bleeding (naproxen and ASA)  . GI bleed     02/2010, EGD->pancreatitc rest, fundal gland polyps,  no bleeding. TCS->blood-tinged colonic effluent throughout the colon without bleeding lesion found, nl TI. SB capsule->SB erosions, nonbleeding, incomplete study. Patient on naproxyn.   . DM (diabetes mellitus)   . Pneumonia   . Hyperlipidemia   . HTN (hypertension)   . Stroke   . GERD (gastroesophageal reflux disease)   . Poor vision     left eye  . Nephrolithiasis   . Sleep apnea   . Obesity   . Ischemic colitis, enteritis, or enterocolitis     flex sig 01/2010  . Chronic renal insufficiency   . Gastric AVM 10/24/10    GI  bleed EGD w/ bicap and hemoclip placement, 2 AVMs, presented w/bright red rectal bleeding  . GI bleed 10/27/10    ileocolonoscopy/GIVENS capsule study by Dr Rourk->bloody colonic effluent throughout colon but no lesion identified, TA @ hepatic flexure, fresh blood in dital SB on capsule. Nuc med RBC study negative  . Tubular adenoma of colon 10/27/10    on colonoscopy Dr Jena Gauss    Medications:  Scheduled:  . albuterol  2.5 mg Nebulization Q6H  . [START ON 10/20/2012] amLODipine  5 mg Oral Daily  . [COMPLETED] aspirin  324 mg Oral Once  . [START ON 10/20/2012] aspirin EC  81 mg Oral Daily  . [COMPLETED] furosemide  40 mg Intravenous Once  . furosemide  40 mg Intravenous Once  . [START ON 10/20/2012] furosemide  80 mg Intravenous Q12H  . guaiFENesin  600 mg Oral BID  . heparin  5,000 Units Subcutaneous Q8H  . [START ON 10/20/2012] insulin aspart  0-15 Units Subcutaneous TID WC  . insulin aspart  0-5 Units Subcutaneous QHS  . [START ON 10/20/2012] insulin aspart protamine- aspart  20 Units Subcutaneous BID WC  . [START ON 10/20/2012] lisinopril  15 mg Oral Daily  . [START ON 10/20/2012] nebivolol  10 mg Oral Daily  . [COMPLETED] nitroGLYCERIN  0.4 mg Sublingual Once  . nystatin   Topical TID  . [COMPLETED] ondansetron  4 mg Intravenous Once  . pantoprazole  40 mg  Oral QHS  . sodium chloride  3 mL Intravenous Q12H  . sodium chloride  3 mL Intravenous Q12H  . [DISCONTINUED] furosemide  40 mg Intramuscular Once   Assessment: 79 yo M admitted for PNA.  He has failed oral antibiotic regimens x2.   He is afebrile with slightly elevated WBC.     Goal of Therapy:  Eradicate infection.  Plan:  Levaquin 750mg  IV Q48h Monitor patient progress,renal function, & cx data Duration of therapy per MD  Elson Clan 10/19/2012,8:48 PM

## 2012-10-20 ENCOUNTER — Encounter (HOSPITAL_COMMUNITY): Payer: Self-pay

## 2012-10-20 ENCOUNTER — Inpatient Hospital Stay (HOSPITAL_COMMUNITY): Payer: Medicare Other

## 2012-10-20 DIAGNOSIS — R0602 Shortness of breath: Secondary | ICD-10-CM | POA: Diagnosis not present

## 2012-10-20 DIAGNOSIS — J189 Pneumonia, unspecified organism: Secondary | ICD-10-CM | POA: Diagnosis not present

## 2012-10-20 DIAGNOSIS — J9600 Acute respiratory failure, unspecified whether with hypoxia or hypercapnia: Secondary | ICD-10-CM | POA: Diagnosis not present

## 2012-10-20 DIAGNOSIS — J9601 Acute respiratory failure with hypoxia: Secondary | ICD-10-CM | POA: Diagnosis present

## 2012-10-20 DIAGNOSIS — E119 Type 2 diabetes mellitus without complications: Secondary | ICD-10-CM | POA: Diagnosis not present

## 2012-10-20 DIAGNOSIS — R609 Edema, unspecified: Secondary | ICD-10-CM | POA: Diagnosis not present

## 2012-10-20 DIAGNOSIS — R079 Chest pain, unspecified: Secondary | ICD-10-CM | POA: Diagnosis not present

## 2012-10-20 DIAGNOSIS — Z6841 Body Mass Index (BMI) 40.0 and over, adult: Secondary | ICD-10-CM | POA: Diagnosis not present

## 2012-10-20 DIAGNOSIS — I509 Heart failure, unspecified: Secondary | ICD-10-CM | POA: Diagnosis not present

## 2012-10-20 DIAGNOSIS — I10 Essential (primary) hypertension: Secondary | ICD-10-CM | POA: Diagnosis not present

## 2012-10-20 DIAGNOSIS — I5033 Acute on chronic diastolic (congestive) heart failure: Secondary | ICD-10-CM | POA: Diagnosis not present

## 2012-10-20 LAB — CBC
MCH: 28.8 pg (ref 26.0–34.0)
MCHC: 31.6 g/dL (ref 30.0–36.0)
MCV: 91 fL (ref 78.0–100.0)
Platelets: 344 10*3/uL (ref 150–400)
RDW: 14.9 % (ref 11.5–15.5)
WBC: 11.8 10*3/uL — ABNORMAL HIGH (ref 4.0–10.5)

## 2012-10-20 LAB — TSH: TSH: 1.098 u[IU]/mL (ref 0.350–4.500)

## 2012-10-20 LAB — COMPREHENSIVE METABOLIC PANEL
ALT: 25 U/L (ref 0–53)
Albumin: 2.9 g/dL — ABNORMAL LOW (ref 3.5–5.2)
Alkaline Phosphatase: 52 U/L (ref 39–117)
BUN: 25 mg/dL — ABNORMAL HIGH (ref 6–23)
Chloride: 101 mEq/L (ref 96–112)
Potassium: 3.5 mEq/L (ref 3.5–5.1)
Total Bilirubin: 0.4 mg/dL (ref 0.3–1.2)

## 2012-10-20 LAB — GLUCOSE, CAPILLARY

## 2012-10-20 LAB — TROPONIN I: Troponin I: <0.3 ng/mL (ref <0.30)

## 2012-10-20 LAB — HEMOGLOBIN A1C: Hgb A1c MFr Bld: 6.9 % — ABNORMAL HIGH (ref <5.7)

## 2012-10-20 MED ORDER — TECHNETIUM TO 99M ALBUMIN AGGREGATED
6.0000 | Freq: Once | INTRAVENOUS | Status: AC | PRN
  Administered 2012-10-20: 5.2 via INTRAVENOUS

## 2012-10-20 MED ORDER — TECHNETIUM TC 99M DIETHYLENETRIAME-PENTAACETIC ACID
40.0000 | Freq: Once | INTRAVENOUS | Status: AC | PRN
  Administered 2012-10-20: 40 via INTRAVENOUS

## 2012-10-20 NOTE — Progress Notes (Signed)
[facility] PROGRESS NOTE  John [name] ZOX:[id number] DOB: 1931-01-07 DOA: 10/19/2012 PCP: Colette Ribas, MD  Assessment/Plan: 1. Acute respiratory failure with hypoxia. Likely multifactorial. Continue to wean down oxygen as tolerated. 2. Community acquired pneumonia. Patient is started on levofloxacin as well as mucolytics. His leukocytosis is improving. Continue current treatments. 3. Acute on chronic diastolic congestive heart failure. Patient does have evidence of volume overload. He is having good urine output. Continue current dose of Lasix. 4. Chronic kidney disease stage III. Renal function appears to be at baseline. 5. Type 2 diabetes. Continue sliding scale coverage 6. Obstructive sleep apnea. Continue CPAP 7. Chest pain. EKG does not have any acute findings. Troponin is negative. VQ scan is also low probability for PE. Venous Dopplers have been ordered. 8. Difficulty swallowing. Will likely need to followup with GI.  Code Status: Full code Family Communication: Discussed with patient (indicate person spoken with, relationship, and if by phone, the number) Disposition Plan: Pending PT/OT evaluation   Consultants:  None  Procedures:  None  Antibiotics:  Levofloxacin 4/23  HPI/Subjective: Still feels short of breath, coughing  Objective: Filed Vitals:   10/20/12 0553 10/20/12 0615 10/20/12 1300 10/20/12 1428  BP: 199/94 170/70  129/67  Pulse: 95 96  80  Temp: 98.7 F (37.1 C)   97.9 F (36.6 C)  TempSrc: Oral     Resp: 21   20  Height:      Weight:      SpO2: 90% 92% 91% 93%    Intake/Output Summary (Last 24 hours) at 10/20/12 1445 Last data filed at 10/20/12 0322  Gross per 24 hour  Intake      0 ml  Output   1600 ml  Net  -1600 ml   Filed Weights   10/19/12 1555 10/19/12 2033 10/20/12 0500  Weight: 121.564 kg (268 lb) 119.4 kg (263 lb 3.7 oz) 118.3 kg (260 lb 12.9 oz)    Exam:   General:  NAD  Cardiovascular: S1, S2, regular  rate and rhythm  Respiratory: Bilateral rhonchi  Abdomen: Soft, nontender, nondistended, bowel sounds are active  Musculoskeletal: 1+ pitting edema bilaterally   Data Reviewed: Basic Metabolic Panel:  Recent Labs Lab 10/19/12 1618 10/19/12 1643 10/20/12 0220  NA 138 142 142  K 3.5 3.5 3.5  CL 100 104 101  CO2 32  --  33*  GLUCOSE 192* 189* 151*  BUN 26* 25* 25*  CREATININE 2.06* 1.90* 2.14*  CALCIUM 9.2  --  8.8   Liver Function Tests:  Recent Labs Lab 10/19/12 1618 10/20/12 0220  AST 30 28  ALT 30 25  ALKPHOS 59 52  BILITOT 0.3 0.4  PROT 6.8 6.4  ALBUMIN 3.2* 2.9*   No results found for this basename: LIPASE, AMYLASE,  in the last 168 hours No results found for this basename: AMMONIA,  in the last 168 hours CBC:  Recent Labs Lab 10/19/12 1618 10/19/12 1643 10/20/12 0220  WBC 11.6*  --  11.8*  NEUTROABS 9.3*  --   --   HGB 12.0* 12.6* 11.8*  HCT 36.3* 37.0* 37.3*  MCV 89.4  --  91.0  PLT 322  --  344   Cardiac Enzymes:  Recent Labs Lab 10/19/12 2030 10/20/12 0220 10/20/12 0815  TROPONINI <0.30 <0.30 <0.30   BNP (last 3 results)  Recent Labs  10/19/12 1618  PROBNP 655.6*   CBG:  Recent Labs Lab 10/19/12 2304 10/20/12 0726 10/20/12 1158  GLUCAP 141* 130* 131*  No results found for this or any previous visit (from the past 240 hour(s)).   Studies: Dg Chest 2 View  10/19/2012  *RADIOLOGY REPORT*  Clinical Data: 79 year old male with shortness of breath. Sleep apnea.  Diabetes.  CHEST - 2 VIEW  Comparison: 02/18/2011.  Findings: Lower lung volumes.  Patchy bibasilar opacity.  Cannot exclude small pleural effusions.  Pulmonary vascularity mildly increased.  No overt edema.  Stable mild cardiomegaly. Visualized tracheal air column is within normal limits.  IMPRESSION: Lower lung volumes with left greater than right bibasilar pulmonary opacity.  Favor atelectasis but left lower lobe pneumonia not excluded.  Small pleural effusions and  increased vascular congestion.   Original Report Authenticated By: Erskine Speed, M.D.    Nm Pulmonary Perf And Vent  10/20/2012  *RADIOLOGY REPORT*  Clinical Data: Shortness of breath and chest pain  NM PULMONARY VENTILATION AND PERFUSION SCAN  Views:  Anterior, posterior, left lateral, right lateral, RPO, LPO, RAO, LAO - ventilation and perfusion  Radiopharmaceutical: Technetium 77m DTPA - ventilation; technetium 2m macroaggregated albumin - perfusion  Dose:  40.0 mCi - ventilation; 5.2 mCi - perfusion  Route of administration:  Inhalation - ventilation; intravenous - perfusion    Comparison: Prior ventilation and perfusion lung scan October 24, 2010 and chest mammographic examinations October 19, 2012  Findings: On the ventilation study, there is homogeneous and symmetric uptake of radiotracer bilaterally.  On the perfusion study, there is homogeneous and symmetric uptake of radiotracer bilaterally.  There is no ventilation / perfusion mismatch on either side.  IMPRESSION:  No appreciable ventilation-perfusion defects.  Very low probability of pulmonary embolus.   Original Report Authenticated By: Bretta Bang, M.D.     Scheduled Meds: . albuterol  2.5 mg Nebulization Q6H  . amLODipine  5 mg Oral Daily  . aspirin EC  81 mg Oral Daily  . furosemide  80 mg Intravenous Q12H  . guaiFENesin  600 mg Oral BID  . heparin  5,000 Units Subcutaneous Q8H  . insulin aspart  0-15 Units Subcutaneous TID WC  . insulin aspart  0-5 Units Subcutaneous QHS  . insulin aspart protamine- aspart  20 Units Subcutaneous BID WC  . levofloxacin (LEVAQUIN) IV  750 mg Intravenous Q48H  . lisinopril  15 mg Oral Daily  . nebivolol  10 mg Oral Daily  . nystatin   Topical TID  . pantoprazole  40 mg Oral QHS  . sodium chloride  3 mL Intravenous Q12H  . sodium chloride  3 mL Intravenous Q12H   Continuous Infusions:   Principal Problem:   Dyspnea Active Problems:   HYPERTENSION   GASTROINTESTINAL HEMORRHAGE, HX OF    Weakness generalized   CKD (chronic kidney disease), stage III   BPH (benign prostatic hypertrophy)   DM type 2 (diabetes mellitus, type 2)   Diastolic CHF, chronic    Time spent:    MEMON,JEHANZEB  [facility] Pager [phone]. If 7PM-7AM, please contact night-coverage at [email or web address], password [facility] 10/20/2012, 2:45 PM  LOS: 1 day

## 2012-10-20 NOTE — Progress Notes (Signed)
OT Cancellation Note  Patient Details Name: John [name] MRN: [id number] DOB: 03/03/1931   Cancelled Treatment:    Reason Eval/Treat Not Completed: Patient at procedure or test/ unavailable. Pt receiving EKG. Will attempt OT eval at a later date.  [name], OTR/L,CBIS   10/20/2012, 10:55 AM

## 2012-10-21 DIAGNOSIS — J189 Pneumonia, unspecified organism: Secondary | ICD-10-CM

## 2012-10-21 DIAGNOSIS — J9600 Acute respiratory failure, unspecified whether with hypoxia or hypercapnia: Secondary | ICD-10-CM

## 2012-10-21 DIAGNOSIS — I5033 Acute on chronic diastolic (congestive) heart failure: Principal | ICD-10-CM

## 2012-10-21 LAB — CBC
HCT: 38 % — ABNORMAL LOW (ref 39.0–52.0)
Hemoglobin: 12.1 g/dL — ABNORMAL LOW (ref 13.0–17.0)
MCH: 29.3 pg (ref 26.0–34.0)
MCHC: 31.8 g/dL (ref 30.0–36.0)
MCV: 92 fL (ref 78.0–100.0)
RBC: 4.13 MIL/uL — ABNORMAL LOW (ref 4.22–5.81)

## 2012-10-21 LAB — BASIC METABOLIC PANEL
BUN: 30 mg/dL — ABNORMAL HIGH (ref 6–23)
CO2: 37 mEq/L — ABNORMAL HIGH (ref 19–32)
Chloride: 97 mEq/L (ref 96–112)
Glucose, Bld: 136 mg/dL — ABNORMAL HIGH (ref 70–99)
Potassium: 3.2 mEq/L — ABNORMAL LOW (ref 3.5–5.1)

## 2012-10-21 LAB — GLUCOSE, CAPILLARY
Glucose-Capillary: 127 mg/dL — ABNORMAL HIGH (ref 70–99)
Glucose-Capillary: 153 mg/dL — ABNORMAL HIGH (ref 70–99)
Glucose-Capillary: 171 mg/dL — ABNORMAL HIGH (ref 70–99)

## 2012-10-21 MED ORDER — BIOTENE DRY MOUTH MT LIQD
15.0000 mL | Freq: Two times a day (BID) | OROMUCOSAL | Status: DC
  Administered 2012-10-21 – 2012-10-24 (×6): 15 mL via OROMUCOSAL

## 2012-10-21 MED ORDER — FUROSEMIDE 10 MG/ML IJ SOLN
40.0000 mg | Freq: Two times a day (BID) | INTRAMUSCULAR | Status: DC
  Filled 2012-10-21: qty 4

## 2012-10-21 MED ORDER — SODIUM CHLORIDE 0.9 % IJ SOLN
INTRAMUSCULAR | Status: AC
  Administered 2012-10-21: 3 mL
  Filled 2012-10-21: qty 3

## 2012-10-21 MED ORDER — METHYLPREDNISOLONE SODIUM SUCC 125 MG IJ SOLR
60.0000 mg | Freq: Two times a day (BID) | INTRAMUSCULAR | Status: DC
  Administered 2012-10-21 – 2012-10-23 (×4): 60 mg via INTRAVENOUS
  Filled 2012-10-21 (×5): qty 2

## 2012-10-21 MED ORDER — POTASSIUM CHLORIDE CRYS ER 20 MEQ PO TBCR
40.0000 meq | EXTENDED_RELEASE_TABLET | Freq: Once | ORAL | Status: AC
  Administered 2012-10-21: 40 meq via ORAL
  Filled 2012-10-21: qty 2

## 2012-10-21 MED ORDER — FUROSEMIDE 40 MG PO TABS
40.0000 mg | ORAL_TABLET | Freq: Every day | ORAL | Status: DC
  Administered 2012-10-22: 40 mg via ORAL
  Filled 2012-10-21: qty 1

## 2012-10-21 NOTE — Progress Notes (Signed)
[facility] PROGRESS NOTE  John [name] Llorens OZH:[id number] DOB: 10/01/1930 DOA: 10/19/2012 PCP: Colette Ribas, MD  Assessment/Plan: 1. Acute respiratory failure with hypoxia. Likely multifactorial. Continue to wean down oxygen as tolerated. Patient is having some wheezing and diminished breath sounds.  He reports chronic wheezing and reports once being on advair.  He does not carry a diagnosis of COPD or asthma.  Will give trial of steroids. 2. Community acquired pneumonia. Patient is started on levofloxacin as well as mucolytics. Continue current treatments. Repeat chest xray in am. 3. Acute on chronic diastolic congestive heart failure. Patient does have evidence of volume overload. He is having good urine output. Continue current dose of Lasix. His net fluid balance is -4.7L.  Creatinine is trending up, so will change lasix to po. 4. Chronic kidney disease stage III. Renal function appears to be at baseline. Creatinine trending up due to diuresis. Lasix changed to po, continue to follow. 5. Type 2 diabetes. Continue sliding scale coverage 6. Obstructive sleep apnea. Continue CPAP 7. Chest pain. EKG does not have any acute findings. Troponin is negative. VQ scan is also low probability for PE. Venous Dopplers negative for DVT.  No further pain. 8. Difficulty swallowing. Will likely need to followup with GI.  Code Status: Full code Family Communication: Discussed with patient  Disposition Plan: discharge home once stable   Consultants:  None  Procedures:  None  Antibiotics:  Levofloxacin 4/23  HPI/Subjective: Feels a little better today.  Has not had significant coughing or sputum production.  Wheezing today.  Objective: Filed Vitals:   10/21/12 0500 10/21/12 0814 10/21/12 1433 10/21/12 1519  BP: 139/61   161/53  Pulse: 85   81  Temp: 99 F (37.2 C)   97.8 F (36.6 C)  TempSrc: Oral     Resp: 22   20  Height:      Weight: 116.6 kg (257 lb 0.9 oz)     SpO2: 93%  90% 91% 94%    Intake/Output Summary (Last 24 hours) at 10/21/12 1752 Last data filed at 10/21/12 1200  Gross per 24 hour  Intake    843 ml  Output   4025 ml  Net  -3182 ml   Filed Weights   10/19/12 2033 10/20/12 0500 10/21/12 0500  Weight: 119.4 kg (263 lb 3.7 oz) 118.3 kg (260 lb 12.9 oz) 116.6 kg (257 lb 0.9 oz)    Exam:   General:  NAD  Cardiovascular: S1, S2, regular rate and rhythm  Respiratory: dimnished breath sounds b/l with mild exp wheeze  Abdomen: Soft, nontender, nondistended, bowel sounds are active  Musculoskeletal: 1+ pitting edema bilaterally   Data Reviewed: Basic Metabolic Panel:  Recent Labs Lab 10/19/12 1618 10/19/12 1643 10/20/12 0220 10/21/12 0529  NA 138 142 142 142  K 3.5 3.5 3.5 3.2*  CL 100 104 101 97  CO2 32  --  33* 37*  GLUCOSE 192* 189* 151* 136*  BUN 26* 25* 25* 30*  CREATININE 2.06* 1.90* 2.14* 2.63*  CALCIUM 9.2  --  8.8 8.9   Liver Function Tests:  Recent Labs Lab 10/19/12 1618 10/20/12 0220  AST 30 28  ALT 30 25  ALKPHOS 59 52  BILITOT 0.3 0.4  PROT 6.8 6.4  ALBUMIN 3.2* 2.9*   No results found for this basename: LIPASE, AMYLASE,  in the last 168 hours No results found for this basename: AMMONIA,  in the last 168 hours CBC:  Recent Labs Lab 10/19/12 1618 10/19/12 1643  10/20/12 0220 10/21/12 0529  WBC 11.6*  --  11.8* 12.3*  NEUTROABS 9.3*  --   --   --   HGB 12.0* 12.6* 11.8* 12.1*  HCT 36.3* 37.0* 37.3* 38.0*  MCV 89.4  --  91.0 92.0  PLT 322  --  344 387   Cardiac Enzymes:  Recent Labs Lab 10/19/12 2030 10/20/12 0220 10/20/12 0815  TROPONINI <0.30 <0.30 <0.30   BNP (last 3 results)  Recent Labs  10/19/12 1618  PROBNP 655.6*   CBG:  Recent Labs Lab 10/20/12 1706 10/20/12 2103 10/21/12 0742 10/21/12 1132 10/21/12 1623  GLUCAP 156* 165* 127* 150* 153*    No results found for this or any previous visit (from the past 240 hour(s)).   Studies: Nm Pulmonary Perf And  Vent  10/20/2012  *RADIOLOGY REPORT*  Clinical Data: Shortness of breath and chest pain  NM PULMONARY VENTILATION AND PERFUSION SCAN  Views:  Anterior, posterior, left lateral, right lateral, RPO, LPO, RAO, LAO - ventilation and perfusion  Radiopharmaceutical: Technetium 59m DTPA - ventilation; technetium 78m macroaggregated albumin - perfusion  Dose:  40.0 mCi - ventilation; 5.2 mCi - perfusion  Route of administration:  Inhalation - ventilation; intravenous - perfusion    Comparison: Prior ventilation and perfusion lung scan October 24, 2010 and chest mammographic examinations October 19, 2012  Findings: On the ventilation study, there is homogeneous and symmetric uptake of radiotracer bilaterally.  On the perfusion study, there is homogeneous and symmetric uptake of radiotracer bilaterally.  There is no ventilation / perfusion mismatch on either side.  IMPRESSION:  No appreciable ventilation-perfusion defects.  Very low probability of pulmonary embolus.   Original Report Authenticated By: Bretta Bang, M.D.    US Venous Img Lower Bilateral  10/20/2012  *RADIOLOGY REPORT*  Clinical Data: Bilateral lower extremity edema.  BILATERAL LOWER EXTREMITY VENOUS DUPLEX ULTRASOUND  Technique:  Gray-scale sonography with graded compression, as well as color Doppler and duplex ultrasound, were performed to evaluate the deep venous system of both lower extremities from the level of the common femoral vein through the popliteal and proximal calf veins.  Spectral Doppler was utilized to evaluate flow at rest and with distal augmentation maneuvers.  Comparison:  None.  Findings:  Normal compressibility of bilateral common femoral, superficial femoral, and popliteal veins is demonstrated, as well as the visualized proximal calf veins.  No filling defects to suggest DVT on grayscale or color Doppler imaging.  Doppler waveforms show normal direction of venous flow, normal respiratory phasicity and response to augmentation.  No  evidence of superficial thrombophlebitis or abnormal fluid collection.  Prominent subcutaneous edema is noted in both lower legs, especially around the ankles.  IMPRESSION: No evidence of deep vein thrombosis in either lower extremity.   Original Report Authenticated By: Irish Lack, M.D.     Scheduled Meds: . albuterol  2.5 mg Nebulization Q6H  . amLODipine  5 mg Oral Daily  . aspirin EC  81 mg Oral Daily  . [START ON 10/22/2012] furosemide  40 mg Oral Daily  . guaiFENesin  600 mg Oral BID  . heparin  5,000 Units Subcutaneous Q8H  . insulin aspart  0-15 Units Subcutaneous TID WC  . insulin aspart  0-5 Units Subcutaneous QHS  . insulin aspart protamine- aspart  20 Units Subcutaneous BID WC  . levofloxacin (LEVAQUIN) IV  750 mg Intravenous Q48H  . nebivolol  10 mg Oral Daily  . nystatin   Topical TID  . pantoprazole  40 mg Oral  QHS  . potassium chloride  40 mEq Oral Once  . sodium chloride  3 mL Intravenous Q12H  . sodium chloride  3 mL Intravenous Q12H   Continuous Infusions:   Principal Problem:   Dyspnea Active Problems:   HYPERTENSION   GASTROINTESTINAL HEMORRHAGE, HX OF   Weakness generalized   CKD (chronic kidney disease), stage III   BPH (benign prostatic hypertrophy)   DM type 2 (diabetes mellitus, type 2)   Acute on chronic diastolic CHF (congestive heart failure)   CAP (community acquired pneumonia)   Acute respiratory failure with hypoxia    Time spent:    MEMON,JEHANZEB  [facility] Pager [phone]. If 7PM-7AM, please contact night-coverage at [email or web address], password [facility] 10/21/2012, 5:52 PM  LOS: 2 days

## 2012-10-21 NOTE — Progress Notes (Signed)
ANTIBIOTIC CONSULT NOTE   Pharmacy Consult for Levaquin Indication: pneumonia  Allergies  Allergen Reactions  . Betadine (Povidone Iodine)   . Latex   . Povidone   . Propoxyphene Hcl   . Sulfonamide Derivatives    Patient Measurements: Height: 5\' 7"  (170.2 cm) Weight: 257 lb 0.9 oz (116.6 kg) IBW/kg (Calculated) : 66.1  Vital Signs: Temp: 99 F (37.2 C) (04/25 0500) Temp src: Oral (04/25 0500) BP: 139/61 mmHg (04/25 0500) Pulse Rate: 85 (04/25 0500) Intake/Output from previous day: 04/24 0701 - 04/25 0700 In: 240 [P.O.:240] Out: 3525 [Urine:3525] Intake/Output from this shift: Total I/O In: 3 [I.V.:3] Out: -   Labs:  Recent Labs  10/19/12 1618 10/19/12 1643 10/20/12 0220 10/21/12 0529  WBC 11.6*  --  11.8* 12.3*  HGB 12.0* 12.6* 11.8* 12.1*  PLT 322  --  344 387  CREATININE 2.06* 1.90* 2.14* 2.63*   Estimated Creatinine Clearance: 27.8 ml/min (by C-G formula based on Cr of 2.63). No results found for this basename: VANCOTROUGH, VANCOPEAK, VANCORANDOM, GENTTROUGH, GENTPEAK, GENTRANDOM, TOBRATROUGH, TOBRAPEAK, TOBRARND, AMIKACINPEAK, AMIKACINTROU, AMIKACIN,  in the last 72 hours   Microbiology: No results found for this or any previous visit (from the past 720 hour(s)).  Medical History: Past Medical History  Diagnosis Date  . GI bleed     2006, TCS/TI showed small polyp. EGD showed erosive antral gastritis with two polyp, one oozing and tx. HP neg. SB capsule shoed few jejunal ulcers with bleeding (naproxen and ASA)  . GI bleed     02/2010, EGD->pancreatitc rest, fundal gland polyps,  no bleeding. TCS->blood-tinged colonic effluent throughout the colon without bleeding lesion found, nl TI. SB capsule->SB erosions, nonbleeding, incomplete study. Patient on naproxyn.   . DM (diabetes mellitus)   . Pneumonia   . Hyperlipidemia   . HTN (hypertension)   . Stroke   . GERD (gastroesophageal reflux disease)   . Poor vision     left eye  . Nephrolithiasis   .  Sleep apnea   . Obesity   . Ischemic colitis, enteritis, or enterocolitis     flex sig 01/2010  . Chronic renal insufficiency   . Gastric AVM 10/24/10    GI bleed EGD w/ bicap and hemoclip placement, 2 AVMs, presented w/bright red rectal bleeding  . GI bleed 10/27/10    ileocolonoscopy/GIVENS capsule study by Dr Rourk->bloody colonic effluent throughout colon but no lesion identified, TA @ hepatic flexure, fresh blood in dital SB on capsule. Nuc med RBC study negative  . Tubular adenoma of colon 10/27/10    on colonoscopy Dr Jena Gauss   Medications:  Scheduled:  . albuterol  2.5 mg Nebulization Q6H  . amLODipine  5 mg Oral Daily  . aspirin EC  81 mg Oral Daily  . furosemide  40 mg Intravenous Q12H  . guaiFENesin  600 mg Oral BID  . heparin  5,000 Units Subcutaneous Q8H  . insulin aspart  0-15 Units Subcutaneous TID WC  . insulin aspart  0-5 Units Subcutaneous QHS  . insulin aspart protamine- aspart  20 Units Subcutaneous BID WC  . levofloxacin (LEVAQUIN) IV  750 mg Intravenous Q48H  . nebivolol  10 mg Oral Daily  . nystatin   Topical TID  . pantoprazole  40 mg Oral QHS  . sodium chloride  3 mL Intravenous Q12H  . sodium chloride  3 mL Intravenous Q12H  . [COMPLETED] sodium chloride      . [DISCONTINUED] furosemide  80 mg Intravenous Q12H  . [  DISCONTINUED] lisinopril  15 mg Oral Daily   Assessment: John [name] admitted for PNA.  He has failed oral antibiotic regimens x2.   He is afebrile with slightly elevated WBC.   SCr is rising.  Estimated Creatinine Clearance: 27.8 ml/min (by C-G formula based on Cr of 2.63).  Goal of Therapy:  Eradicate infection.  Plan:  Levaquin 750mg  IV Q48h Monitor patient progress,renal function, & cx data Duration of therapy per MD  Valrie Hart A 10/21/2012,10:49 AM

## 2012-10-21 NOTE — Evaluation (Signed)
Occupational Therapy Evaluation Patient Details Name: John [name] MRN: [id number] DOB: 11-07-1930 Today's Date: 10/21/2012 Time: 7829-5621 OT Time Calculation (min): 26 min  OT Assessment / Plan / Recommendation Clinical Impression  Patient is a 79 y/o male s/p Dyspnea presenting to acute OT with deficits below. Pt will benefit from OT services to increase overal strength and endurance. Recommend Home Health at D/C.    OT Assessment  Patient needs continued OT Services    Follow Up Recommendations  Home health OT    Barriers to Discharge Decreased caregiver support Daughter stops by every two weeks to fill med box  Equipment Recommendations  Tub/shower bench       Frequency  Min 2X/week    Precautions / Restrictions Precautions Precautions: None   Pertinent Vitals/Pain No complaints.    ADL  Lower Body Dressing: Performed;Supervision/safety Where Assessed - Lower Body Dressing: Unsupported sitting Toilet Transfer: Performed;Min guard Toilet Transfer Method: Stand pivot Acupuncturist:  (to recliner) Transfers/Ambulation Related to ADLs: Patient transfers with Min guard without DME. Patient would be more stable with RW and would probably be Supervision.    OT Diagnosis: Generalized weakness  OT Problem List: Decreased strength;Decreased activity tolerance;Impaired balance (sitting and/or standing) OT Treatment Interventions: Self-care/ADL training;Therapeutic exercise;Balance training;Energy conservation;Patient/family education;DME and/or AE instruction   OT Goals Acute Rehab OT Goals OT Goal Formulation: With patient Time For Goal Achievement: 11/04/12 Potential to Achieve Goals: Good ADL Goals Pt Will Perform Grooming: with modified independence;Standing at sink ADL Goal: Grooming - Progress: Goal set today Pt Will Perform Lower Body Bathing: with modified independence ADL Goal: Lower Body Bathing - Progress: Goal set today Pt Will Perform Lower Body  Dressing: with modified independence ADL Goal: Lower Body Dressing - Progress: Goal set today Pt Will Transfer to Toilet: with modified independence ADL Goal: Toilet Transfer - Progress: Goal set today Pt Will Perform Toileting - Clothing Manipulation: Independently ADL Goal: Toileting - Clothing Manipulation - Progress: Goal set today Pt Will Perform Toileting - Hygiene: Independently ADL Goal: Toileting - Hygiene - Progress: Goal set today Arm Goals Pt Will Complete Theraband Exer: Independently;to maintain strength;Bilateral upper extremities;2 sets;10 reps;Level 2 Theraband Arm Goal: Theraband Exercises - Progress: Goal set today  Visit Information  Last OT Received On: 10/21/12    Subjective Data  Subjective: S: I thought I was suppose to be getting better but since I've been here I haven't been up at all.  Patient Stated Goal: To get stronger   Prior Functioning     Home Living Lives With: Alone Available Help at Discharge: Family;Available PRN/intermittently (daughter stops by every 2 weeks to fill med box) Type of Home: House Bathroom Shower/Tub: Engineer, manufacturing systems: Standard Home Adaptive Equipment: Straight cane;Walker - rolling Prior Function Level of Independence: Independent with assistive device(s) Driving: No Vocation: Retired Musician: No difficulties Dominant Hand: Right         Vision/Perception Vision - History Baseline Vision: Wears glasses all the time Patient Visual Report: No change from baseline   Cognition  Cognition Arousal/Alertness: Awake/alert Behavior During Therapy: WFL for tasks assessed/performed Overall Cognitive Status: Within Functional Limits for tasks assessed    Extremity/Trunk Assessment Right Upper Extremity Assessment RUE ROM/Strength/Tone: WFL for tasks assessed Left Upper Extremity Assessment LUE ROM/Strength/Tone: WFL for tasks assessed     Mobility Bed Mobility Bed Mobility: Supine  to Sit Supine to Sit: 6: Modified independent (Device/Increase time);HOB elevated;With rails Transfers Transfers: Sit to Stand;Stand to Sit Sit to Stand:  5: Supervision;From bed Stand to Sit: 5: Supervision;To chair/3-in-1           End of Session OT - End of Session Activity Tolerance: Patient tolerated treatment well Patient left: in chair;with call bell/phone within reach;with nursing in room    [facility], OTR/L,CBIS   10/21/2012, 8:50 AM

## 2012-10-21 NOTE — Progress Notes (Signed)
UR Chart Review Completed  

## 2012-10-21 NOTE — Care Management Note (Signed)
    Page 1 of 2   10/24/2012     2:25:41 PM   CARE MANAGEMENT NOTE 10/24/2012  Patient:  John [name], John [name]   Account Number:  [id number]  Date Initiated:  10/21/2012  Documentation initiated by:  Rosemary Holms  Subjective/Objective Assessment:   Pt admitted from home. Previously had PT from [place] who no longer are in this area. Does not qualify for PT now but has agreed to RN with [facility].     Action/Plan:   Anticipated DC Date:  10/24/2012   Anticipated DC Plan:  HOME W HOME HEALTH SERVICES      DC Planning Services  CM consult      [facility] Choice  HOME HEALTH   Choice offered to / List presented to:  C-1 Patient        HH arranged  HH-1 RN  HH-2 PT      [facility] agency  [facility]   Status of service:  In process, will continue to follow Medicare Important Message given?  YES (If response is "NO", the following Medicare IM given date fields will be blank) Date Medicare IM given:  10/24/2012 Date Additional Medicare IM given:    Discharge Disposition:  HOME W HOME HEALTH SERVICES  Per UR Regulation:    If discussed at Long Length of Stay Meetings, dates discussed:    Comments:  10/24/12 Rosemary Holms RN BSN CM [facility] will follow pt at home for community case management. [facility] RN and PT for [facility] needs set. Home O2 not required. Desat with ambulation to 92% without O2.  10/21/12 Maizey Menendez Leanord Hawking RN BSN CM Alroy Bailiff with [facility] alerted of selection for [facility] after DC.

## 2012-10-21 NOTE — Progress Notes (Signed)
Patient was placed on CPAP and only wore it for a very period of time. Patient felt he didn't need it and took mask off. Patient nurse removed and made secured his O2 nasal cannula back in place.

## 2012-10-21 NOTE — Evaluation (Signed)
Physical Therapy Evaluation Patient Details Name: John [name] MRN: [id number] DOB: 1931/01/30 Today's Date: 10/21/2012 Time: 4098-1191 PT Time Calculation (min): 23 min  PT Assessment / Plan / Recommendation Clinical Impression   Pt appears to be functioning at prior level    PT Assessment  Patent does not need any further PT services    Follow Up Recommendations  No PT follow up    Does the patient have the potential to tolerate intense rehabilitation    n/a  Barriers to Discharge  none      Equipment Recommendations    none   Recommendations for Other Services   none  Frequency      Precautions / Restrictions Precautions Precautions: None Restrictions Weight Bearing Restrictions: No   Pertinent Vitals/Pain o/10      Mobility  Bed Mobility Bed Mobility: Supine to Sit Supine to Sit: 6: Modified independent (Device/Increase time);HOB elevated;With rails Transfers Transfers: Sit to Stand Sit to Stand: 5: Supervision Stand to Sit: 5: Supervision Ambulation/Gait Ambulation Distance (Feet): 200 Feet Assistive device: Rolling walker Gait Pattern: Within Functional Limits Gait velocity: normal    Exercises     PT Diagnosis:   N/A PT Problem List:  N/A PT Treatment Interventions:   none  PT Goals    Visit Information  Last PT Received On: 10/21/12    Subjective Data  Subjective: Pt states he is breathing better than he was when he first came in.  Pt has canes and walkers at home but states that truthfully he walks in his home holding onto the walls. Patient Stated Goal: go home   Prior Functioning  Home Living Lives With: Alone Available Help at Discharge: Family;Available PRN/intermittently (daughter stops by every 2 weeks to fill med box) Type of Home: House Home Access: Stairs to enter [employer] of Steps: 5 Entrance Stairs-Rails: Right Home Layout: One level Bathroom Shower/Tub: Engineer, manufacturing systems: Standard Home  Adaptive Equipment: Straight cane;Walker - rolling Prior Function Level of Independence: Independent with assistive device(s) Driving: No Vocation: Retired Musician: No difficulties Dominant Hand: Right    Cognition  Cognition Arousal/Alertness: Awake/alert Behavior During Therapy: WFL for tasks assessed/performed Overall Cognitive Status: Within Functional Limits for tasks assessed    Extremity/Trunk Assessment Right Upper Extremity Assessment RUE ROM/Strength/Tone: [facility] for tasks assessed Left Upper Extremity Assessment LUE ROM/Strength/Tone: WFL for tasks assessed Right Lower Extremity Assessment RLE ROM/Strength/Tone: [facility] for tasks assessed Left Lower Extremity Assessment LLE ROM/Strength/Tone: WFL for tasks assessed   Balance    End of Session PT - End of Session Equipment Utilized During Treatment: Gait belt Activity Tolerance: Patient tolerated treatment well Patient left: in bed  GP     Mollye Guinta,CINDY 10/21/2012, 9:48 AM

## 2012-10-21 NOTE — Progress Notes (Signed)
After speaking with Pt, who I have known for years. He explained to me he was not going to wear CPAP machine. He did not wear it last night and does not plain to wear it tonight. Will place on CPAP if wants it or medically needs it.

## 2012-10-21 NOTE — Plan of Care (Signed)
Problem: Phase I Progression Outcomes Goal: Progress activity as tolerated unless otherwise ordered Outcome: Progressing 10/21/12 1521 Up to chair with therapy this morning, instructed to call for assistance and not attempt getting up on his own. Call light and telephone kept within reach. bedalarm on for safety at this time. Earnstine Regal, RN

## 2012-10-22 ENCOUNTER — Inpatient Hospital Stay (HOSPITAL_COMMUNITY): Payer: Medicare Other

## 2012-10-22 DIAGNOSIS — R0602 Shortness of breath: Secondary | ICD-10-CM | POA: Diagnosis not present

## 2012-10-22 DIAGNOSIS — I5033 Acute on chronic diastolic (congestive) heart failure: Secondary | ICD-10-CM | POA: Diagnosis not present

## 2012-10-22 DIAGNOSIS — J189 Pneumonia, unspecified organism: Secondary | ICD-10-CM | POA: Diagnosis not present

## 2012-10-22 DIAGNOSIS — J9600 Acute respiratory failure, unspecified whether with hypoxia or hypercapnia: Secondary | ICD-10-CM | POA: Diagnosis not present

## 2012-10-22 LAB — BASIC METABOLIC PANEL
Chloride: 94 mEq/L — ABNORMAL LOW (ref 96–112)
GFR calc Af Amer: 32 mL/min — ABNORMAL LOW (ref >90)
GFR calc non Af Amer: 27 mL/min — ABNORMAL LOW (ref >90)
Potassium: 3.8 mEq/L (ref 3.5–5.1)
Sodium: 135 mEq/L (ref 135–145)

## 2012-10-22 LAB — GLUCOSE, CAPILLARY
Glucose-Capillary: 166 mg/dL — ABNORMAL HIGH (ref 70–99)
Glucose-Capillary: 247 mg/dL — ABNORMAL HIGH (ref 70–99)
Glucose-Capillary: 266 mg/dL — ABNORMAL HIGH (ref 70–99)

## 2012-10-22 MED ORDER — FUROSEMIDE 40 MG PO TABS
40.0000 mg | ORAL_TABLET | Freq: Two times a day (BID) | ORAL | Status: DC
  Administered 2012-10-22 – 2012-10-23 (×3): 40 mg via ORAL
  Filled 2012-10-22 (×3): qty 1

## 2012-10-22 MED ORDER — LEVOFLOXACIN 750 MG PO TABS
750.0000 mg | ORAL_TABLET | ORAL | Status: DC
  Administered 2012-10-23: 750 mg via ORAL
  Filled 2012-10-22: qty 1

## 2012-10-22 NOTE — Progress Notes (Signed)
[facility] PROGRESS NOTE  John [name] ZOX:[id number] DOB: 1931-03-16 DOA: 10/19/2012 PCP: Colette Ribas, MD  Assessment/Plan: 1. Acute respiratory failure with hypoxia. Likely multifactorial. Continue to wean down oxygen as tolerated. Patient is having some wheezing and diminished breath sounds.  He reports chronic wheezing and reports once being on advair.  He does not carry a diagnosis of COPD or asthma.  He has been started on steroids and reports some subjective improvement. We'll continue current treatments. 2. Community acquired pneumonia. Patient is on levofloxacin as well as mucolytics. Continue current treatments.  3. Acute on chronic diastolic congestive heart failure. Volume status appears to have improved. We'll continue with by mouth Lasix. 4. Chronic kidney disease stage III. Renal function appears to be at baseline. Creatinine is stable. Lasix changed to po, continue to follow. 5. Type 2 diabetes. Continue sliding scale coverage 6. Obstructive sleep apnea. Continue CPAP 7. Chest pain. EKG does not have any acute findings. Troponin is negative. VQ scan is also low probability for PE. Venous Dopplers negative for DVT.  No further pain. 8. Difficulty swallowing. Will likely need to followup with GI.  Code Status: Full code Family Communication: Discussed with patient  Disposition Plan: discharge home once stable   Consultants:  None  Procedures:  None  Antibiotics:  Levofloxacin 4/23  HPI/Subjective: Healed breathing may be improving. Still wheezing.  Objective: Filed Vitals:   10/22/12 0700 10/22/12 1300 10/22/12 1434 10/22/12 1801  BP:  182/75  163/57  Pulse:  75    Temp:  97.9 F (36.6 C)    TempSrc:      Resp:  20    Height:      Weight:      SpO2: 94% 95% 95%     Intake/Output Summary (Last 24 hours) at 10/22/12 1857 Last data filed at 10/22/12 1700  Gross per 24 hour  Intake   1986 ml  Output   2125 ml  Net   -139 ml   Filed  Weights   10/20/12 0500 10/21/12 0500 10/22/12 0602  Weight: 118.3 kg (260 lb 12.9 oz) 116.6 kg (257 lb 0.9 oz) 116.801 kg (257 lb 8 oz)    Exam:   General:  NAD  Cardiovascular: S1, S2, regular rate and rhythm  Respiratory: Air movement has improved bilaterally, persistent wheeze bilaterally  Abdomen: Soft, nontender, nondistended, bowel sounds are active  Musculoskeletal: Trace pitting edema bilaterally  Data Reviewed: Basic Metabolic Panel:  Recent Labs Lab 10/19/12 1618 10/19/12 1643 10/20/12 0220 10/21/12 0529 10/22/12 1000  NA 138 142 142 142 135  K 3.5 3.5 3.5 3.2* 3.8  CL 100 104 101 97 94*  CO2 32  --  33* 37* 33*  GLUCOSE 192* 189* 151* 136* 271*  BUN 26* 25* 25* 30* 38*  CREATININE 2.06* 1.90* 2.14* 2.63* 2.16*  CALCIUM 9.2  --  8.8 8.9 9.1   Liver Function Tests:  Recent Labs Lab 10/19/12 1618 10/20/12 0220  AST 30 28  ALT 30 25  ALKPHOS 59 52  BILITOT 0.3 0.4  PROT 6.8 6.4  ALBUMIN 3.2* 2.9*   No results found for this basename: LIPASE, AMYLASE,  in the last 168 hours No results found for this basename: AMMONIA,  in the last 168 hours CBC:  Recent Labs Lab 10/19/12 1618 10/19/12 1643 10/20/12 0220 10/21/12 0529  WBC 11.6*  --  11.8* 12.3*  NEUTROABS 9.3*  --   --   --   HGB 12.0* 12.6* 11.8* 12.1*  HCT 36.3* 37.0* 37.3* 38.0*  MCV 89.4  --  91.0 92.0  PLT 322  --  344 387   Cardiac Enzymes:  Recent Labs Lab 10/19/12 2030 10/20/12 0220 10/20/12 0815  TROPONINI <0.30 <0.30 <0.30   BNP (last 3 results)  Recent Labs  10/19/12 1618  PROBNP 655.6*   CBG:  Recent Labs Lab 10/21/12 1623 10/21/12 2115 10/22/12 0722 10/22/12 1156 10/22/12 1638  GLUCAP 153* 171* 166* 247* 266*    No results found for this or any previous visit (from the past 240 hour(s)).   Studies: Dg Chest Port 1 View  10/22/2012  *RADIOLOGY REPORT*  Clinical Data: Shortness of breath.  PORTABLE CHEST - 1 VIEW  Comparison: 10/19/2012  Findings:  Retrocardiac airspace opacity continues to obscure the left hemidiaphragm.  Borderline cardiomegaly.  Indistinct band-like opacity along the right lung base.  Slight hilar prominence bilaterally is probably vascular.  IMPRESSION:  1.  Continued low lung volumes, with airspace opacities in the lung bases, left greater than right, potentially from atelectasis or pneumonia. 2.  Borderline cardiomegaly. 3.  Mild hilar prominence, probably vascular rather than due to adenopathy.   Original Report Authenticated By: Gaylyn Rong, M.D.     Scheduled Meds: . albuterol  2.5 mg Nebulization Q6H  . amLODipine  5 mg Oral Daily  . antiseptic oral rinse  15 mL Mouth Rinse BID  . aspirin EC  81 mg Oral Daily  . furosemide  40 mg Oral Daily  . guaiFENesin  600 mg Oral BID  . heparin  5,000 Units Subcutaneous Q8H  . insulin aspart  0-15 Units Subcutaneous TID WC  . insulin aspart  0-5 Units Subcutaneous QHS  . insulin aspart protamine- aspart  20 Units Subcutaneous BID WC  . [START ON 10/23/2012] levofloxacin  750 mg Oral Q48H  . methylPREDNISolone (SOLU-MEDROL) injection  60 mg Intravenous Q12H  . nebivolol  10 mg Oral Daily  . nystatin   Topical TID  . pantoprazole  40 mg Oral QHS  . sodium chloride  3 mL Intravenous Q12H   Continuous Infusions:   Principal Problem:   Dyspnea Active Problems:   HYPERTENSION   GASTROINTESTINAL HEMORRHAGE, HX OF   Weakness generalized   CKD (chronic kidney disease), stage III   BPH (benign prostatic hypertrophy)   DM type 2 (diabetes mellitus, type 2)   Acute on chronic diastolic CHF (congestive heart failure)   CAP (community acquired pneumonia)   Acute respiratory failure with hypoxia    Time spent:    Nieve Rojero  [facility] Pager [phone]. If 7PM-7AM, please contact night-coverage at [email or web address], password [facility] 10/22/2012, 6:57 PM  LOS: 3 days

## 2012-10-22 NOTE — Progress Notes (Signed)
ANTIBIOTIC CONSULT NOTE   Pharmacy Consult for Levaquin Indication: pneumonia  Allergies  Allergen Reactions  . Betadine (Povidone Iodine)   . Latex   . Povidone   . Propoxyphene Hcl   . Sulfonamide Derivatives    Patient Measurements: Height: 5\' 7"  (170.2 cm) Weight: 257 lb 8 oz (116.801 kg) IBW/kg (Calculated) : 66.1  Vital Signs: Temp: 97.4 F (36.3 C) (04/26 0602) Temp src: Oral (04/26 0602) BP: 156/88 mmHg (04/26 0602) Pulse Rate: 68 (04/26 0602) Intake/Output from previous day: 04/25 0701 - 04/26 0700 In: 1626 [P.O.:1320; I.V.:6; IV Piggyback:300] Out: 2950 [Urine:2950] Intake/Output from this shift:    Labs:  Recent Labs  10/19/12 1618 10/19/12 1643 10/20/12 0220 10/21/12 0529  WBC 11.6*  --  11.8* 12.3*  HGB 12.0* 12.6* 11.8* 12.1*  PLT 322  --  344 387  CREATININE 2.06* 1.90* 2.14* 2.63*   Estimated Creatinine Clearance: 27.8 ml/min (by C-G formula based on Cr of 2.63). No results found for this basename: VANCOTROUGH, VANCOPEAK, VANCORANDOM, GENTTROUGH, GENTPEAK, GENTRANDOM, TOBRATROUGH, TOBRAPEAK, TOBRARND, AMIKACINPEAK, AMIKACINTROU, AMIKACIN,  in the last 72 hours   Microbiology: No results found for this or any previous visit (from the past 720 hour(s)).  Medical History: Past Medical History  Diagnosis Date  . GI bleed     2006, TCS/TI showed small polyp. EGD showed erosive antral gastritis with two polyp, one oozing and tx. HP neg. SB capsule shoed few jejunal ulcers with bleeding (naproxen and ASA)  . GI bleed     02/2010, EGD->pancreatitc rest, fundal gland polyps,  no bleeding. TCS->blood-tinged colonic effluent throughout the colon without bleeding lesion found, nl TI. SB capsule->SB erosions, nonbleeding, incomplete study. Patient on naproxyn.   . DM (diabetes mellitus)   . Pneumonia   . Hyperlipidemia   . HTN (hypertension)   . Stroke   . GERD (gastroesophageal reflux disease)   . Poor vision     left eye  . Nephrolithiasis   .  Sleep apnea   . Obesity   . Ischemic colitis, enteritis, or enterocolitis     flex sig 01/2010  . Chronic renal insufficiency   . Gastric AVM 10/24/10    GI bleed EGD w/ bicap and hemoclip placement, 2 AVMs, presented w/bright red rectal bleeding  . GI bleed 10/27/10    ileocolonoscopy/GIVENS capsule study by Dr Rourk->bloody colonic effluent throughout colon but no lesion identified, TA @ hepatic flexure, fresh blood in dital SB on capsule. Nuc med RBC study negative  . Tubular adenoma of colon 10/27/10    on colonoscopy Dr Jena Gauss   Medications:  Scheduled:  . albuterol  2.5 mg Nebulization Q6H  . amLODipine  5 mg Oral Daily  . antiseptic oral rinse  15 mL Mouth Rinse BID  . aspirin EC  81 mg Oral Daily  . furosemide  40 mg Oral Daily  . guaiFENesin  600 mg Oral BID  . heparin  5,000 Units Subcutaneous Q8H  . insulin aspart  0-15 Units Subcutaneous TID WC  . insulin aspart  0-5 Units Subcutaneous QHS  . insulin aspart protamine- aspart  20 Units Subcutaneous BID WC  . [START ON 10/23/2012] levofloxacin  750 mg Oral Q48H  . methylPREDNISolone (SOLU-MEDROL) injection  60 mg Intravenous Q12H  . nebivolol  10 mg Oral Daily  . nystatin   Topical TID  . pantoprazole  40 mg Oral QHS  . [COMPLETED] potassium chloride  40 mEq Oral Once  . sodium chloride  3 mL Intravenous  Q12H  . sodium chloride  3 mL Intravenous Q12H  . [DISCONTINUED] furosemide  40 mg Intravenous Q12H  . [DISCONTINUED] levofloxacin (LEVAQUIN) IV  750 mg Intravenous Q48H   Assessment: 79 yo M admitted for PNA.  He has received 2 doses of IV Levaquin during hospitalization.   He is afebrile with slightly elevated WBC.   SCr is rising.  No culture data available.  Estimated Creatinine Clearance: 27.8 ml/min (by C-G formula based on Cr of 2.63).     Goal of Therapy:  Eradicate infection.  Plan:   CONCERNING: Antibiotic IV to Oral Route Change Policy  RECOMMENDATION: This patient is receiving Levaquin by the intravenous  route.  Based on criteria approved by the Pharmacy and Therapeutics Committee, the antibiotic(s) is/are being converted to the equivalent oral dose form(s).  DESCRIPTION: These criteria include:  Patient being treated for a respiratory tract infection, urinary tract infection, or cellulitis  The patient is not neutropenic and does not exhibit a GI malabsorption state  The patient is eating (either orally or via tube) and/or has been taking other orally administered medications for a least 24 hours  The patient is improving clinically and has a Tmax < 100.5  If you have questions about this conversion, please contact the Pharmacy Department  [x]   [phone] )  [facility] []   [phone] )  [facility]  []   [phone] )  [facility] []   [phone] )  [facility] [place] A 10/22/2012,9:37 AM

## 2012-10-23 DIAGNOSIS — J9600 Acute respiratory failure, unspecified whether with hypoxia or hypercapnia: Secondary | ICD-10-CM | POA: Diagnosis not present

## 2012-10-23 DIAGNOSIS — E785 Hyperlipidemia, unspecified: Secondary | ICD-10-CM | POA: Diagnosis not present

## 2012-10-23 DIAGNOSIS — I5033 Acute on chronic diastolic (congestive) heart failure: Secondary | ICD-10-CM | POA: Diagnosis not present

## 2012-10-23 LAB — GLUCOSE, CAPILLARY
Glucose-Capillary: 222 mg/dL — ABNORMAL HIGH (ref 70–99)
Glucose-Capillary: 247 mg/dL — ABNORMAL HIGH (ref 70–99)

## 2012-10-23 LAB — BASIC METABOLIC PANEL
BUN: 52 mg/dL — ABNORMAL HIGH (ref 6–23)
Creatinine, Ser: 2.27 mg/dL — ABNORMAL HIGH (ref 0.50–1.35)
GFR calc Af Amer: 30 mL/min — ABNORMAL LOW (ref >90)
GFR calc non Af Amer: 26 mL/min — ABNORMAL LOW (ref >90)
Glucose, Bld: 256 mg/dL — ABNORMAL HIGH (ref 70–99)
Potassium: 3.9 mEq/L (ref 3.5–5.1)

## 2012-10-23 MED ORDER — HYDRALAZINE HCL 20 MG/ML IJ SOLN
10.0000 mg | Freq: Four times a day (QID) | INTRAMUSCULAR | Status: DC | PRN
  Administered 2012-10-24 (×2): 10 mg via INTRAVENOUS
  Filled 2012-10-23 (×2): qty 1

## 2012-10-23 MED ORDER — PREDNISONE 20 MG PO TABS
40.0000 mg | ORAL_TABLET | Freq: Every day | ORAL | Status: DC
  Administered 2012-10-24: 40 mg via ORAL
  Filled 2012-10-23: qty 2

## 2012-10-23 NOTE — Progress Notes (Signed)
Patient ambulated halls independently with supervision this afternoon.  RA stats with ambulation were 88-91%.  Return to 96% with 2L O2 when back in room.  Tolerated well with minimal SOB.

## 2012-10-23 NOTE — Progress Notes (Signed)
10/23/12 1138 Notified Dr Kerry Hough this morning of patient's elevated BP and ventricular bigeminy on telemetry this morning. Pt given morning BP medications as ordered and rechecked BP 169/74. Notified Dr Kerry Hough. O2 sats 92% on 2 lpm, O2 to be weaned as tolerated. Pt up in chair, repositioned for comfort. Instructed to call for assist, call light within reach. Stated comfortable. Earnstine Regal, RN

## 2012-10-23 NOTE — Progress Notes (Signed)
[facility] PROGRESS NOTE  John [name] ZOX:[id number] DOB: 05/06/1931 DOA: 10/19/2012 PCP: Colette Ribas, MD  Assessment/Plan: 1. Acute respiratory failure with hypoxia. Likely multifactorial. Continue to wean down oxygen as tolerated. Wheezing and breath sounds have improved. We will change steroids to prednisone.. 2. Community acquired pneumonia. Patient is on levofloxacin as well as mucolytics. Continue current treatments. Encouraged incentive spirometry 3. Acute on chronic diastolic congestive heart failure. Volume status appears to have improved. We'll continue with by mouth Lasix. Net volume status is -6.7 L today. 4. Chronic kidney disease stage III. Creatinine is stable. Lasix changed to po, continue to follow. 5. Type 2 diabetes. Continue sliding scale coverage 6. Obstructive sleep apnea. Continue CPAP 7. Chest pain. EKG does not have any acute findings. Troponin is negative. VQ scan is also low probability for PE. Venous Dopplers negative for DVT.  No further pain. 8. Difficulty swallowing. Will likely need to followup with GI.  Code Status: Full code Family Communication: Discussed with patient  Disposition Plan: discharge home once stable. Ambulate patient today. Possible discharge in the next 24-48 hours.   Consultants:  None  Procedures:  None  Antibiotics:  Levofloxacin 4/23  HPI/Subjective: Feels breathing is improved today. Cough is also improving.  Objective: Filed Vitals:   10/23/12 0739 10/23/12 1130 10/23/12 1354 10/23/12 1419  BP:  169/74  167/79  Pulse:  75  76  Temp:    97.4 F (36.3 C)  TempSrc:      Resp:    18  Height:      Weight:      SpO2: 93% 92% 94% 95%    Intake/Output Summary (Last 24 hours) at 10/23/12 1609 Last data filed at 10/23/12 1300  Gross per 24 hour  Intake   1446 ml  Output   2350 ml  Net   -904 ml   Filed Weights   10/21/12 0500 10/22/12 0602 10/23/12 0522  Weight: 116.6 kg (257 lb 0.9 oz) 116.801 kg  (257 lb 8 oz) 114.397 kg (252 lb 3.2 oz)    Exam:   General:  NAD  Cardiovascular: S1, S2, regular rate and rhythm  Respiratory: Clear to auscultation bilaterally. Good air movement.  Abdomen: Soft, nontender, nondistended, bowel sounds are active  Musculoskeletal: Trace pitting edema bilaterally  Data Reviewed: Basic Metabolic Panel:  Recent Labs Lab 10/19/12 1618 10/19/12 1643 10/20/12 0220 10/21/12 0529 10/22/12 1000 10/23/12 0620  NA 138 142 142 142 135 140  K 3.5 3.5 3.5 3.2* 3.8 3.9  CL 100 104 101 97 94* 99  CO2 32  --  33* 37* 33* 32  GLUCOSE 192* 189* 151* 136* 271* 256*  BUN 26* 25* 25* 30* 38* 52*  CREATININE 2.06* 1.90* 2.14* 2.63* 2.16* 2.27*  CALCIUM 9.2  --  8.8 8.9 9.1 9.4   Liver Function Tests:  Recent Labs Lab 10/19/12 1618 10/20/12 0220  AST 30 28  ALT 30 25  ALKPHOS 59 52  BILITOT 0.3 0.4  PROT 6.8 6.4  ALBUMIN 3.2* 2.9*   No results found for this basename: LIPASE, AMYLASE,  in the last 168 hours No results found for this basename: AMMONIA,  in the last 168 hours CBC:  Recent Labs Lab 10/19/12 1618 10/19/12 1643 10/20/12 0220 10/21/12 0529  WBC 11.6*  --  11.8* 12.3*  NEUTROABS 9.3*  --   --   --   HGB 12.0* 12.6* 11.8* 12.1*  HCT 36.3* 37.0* 37.3* 38.0*  MCV 89.4  --  91.0 92.0  PLT 322  --  344 387   Cardiac Enzymes:  Recent Labs Lab 10/19/12 2030 10/20/12 0220 10/20/12 0815  TROPONINI <0.30 <0.30 <0.30   BNP (last 3 results)  Recent Labs  10/19/12 1618  PROBNP 655.6*   CBG:  Recent Labs Lab 10/22/12 1638 10/22/12 2143 10/22/12 2321 10/23/12 0731 10/23/12 1155  GLUCAP 266* 213* 214* 222* 247*    No results found for this or any previous visit (from the past 240 hour(s)).   Studies: Dg Chest Port 1 View  10/22/2012  *RADIOLOGY REPORT*  Clinical Data: Shortness of breath.  PORTABLE CHEST - 1 VIEW  Comparison: 10/19/2012  Findings: Retrocardiac airspace opacity continues to obscure the left  hemidiaphragm.  Borderline cardiomegaly.  Indistinct band-like opacity along the right lung base.  Slight hilar prominence bilaterally is probably vascular.  IMPRESSION:  1.  Continued low lung volumes, with airspace opacities in the lung bases, left greater than right, potentially from atelectasis or pneumonia. 2.  Borderline cardiomegaly. 3.  Mild hilar prominence, probably vascular rather than due to adenopathy.   Original Report Authenticated By: Gaylyn Rong, M.D.     Scheduled Meds: . albuterol  2.5 mg Nebulization Q6H  . amLODipine  5 mg Oral Daily  . antiseptic oral rinse  15 mL Mouth Rinse BID  . aspirin EC  81 mg Oral Daily  . furosemide  40 mg Oral BID  . guaiFENesin  600 mg Oral BID  . heparin  5,000 Units Subcutaneous Q8H  . insulin aspart  0-15 Units Subcutaneous TID WC  . insulin aspart  0-5 Units Subcutaneous QHS  . insulin aspart protamine- aspart  20 Units Subcutaneous BID WC  . levofloxacin  750 mg Oral Q48H  . methylPREDNISolone (SOLU-MEDROL) injection  60 mg Intravenous Q12H  . nebivolol  10 mg Oral Daily  . nystatin   Topical TID  . pantoprazole  40 mg Oral QHS  . sodium chloride  3 mL Intravenous Q12H   Continuous Infusions:   Principal Problem:   Dyspnea Active Problems:   HYPERTENSION   GASTROINTESTINAL HEMORRHAGE, HX OF   Weakness generalized   CKD (chronic kidney disease), stage III   BPH (benign prostatic hypertrophy)   DM type 2 (diabetes mellitus, type 2)   Acute on chronic diastolic CHF (congestive heart failure)   CAP (community acquired pneumonia)   Acute respiratory failure with hypoxia    Time spent:    [name]  [facility] Pager [phone]. If 7PM-7AM, please contact night-coverage at [email or web address], password [facility] 10/23/2012, 4:09 PM  LOS: 4 days

## 2012-10-24 DIAGNOSIS — J189 Pneumonia, unspecified organism: Secondary | ICD-10-CM | POA: Diagnosis not present

## 2012-10-24 DIAGNOSIS — J9600 Acute respiratory failure, unspecified whether with hypoxia or hypercapnia: Secondary | ICD-10-CM | POA: Diagnosis not present

## 2012-10-24 DIAGNOSIS — I5033 Acute on chronic diastolic (congestive) heart failure: Secondary | ICD-10-CM | POA: Diagnosis not present

## 2012-10-24 LAB — BASIC METABOLIC PANEL
BUN: 60 mg/dL — ABNORMAL HIGH (ref 6–23)
Chloride: 100 mEq/L (ref 96–112)
Creatinine, Ser: 2.25 mg/dL — ABNORMAL HIGH (ref 0.50–1.35)
GFR calc Af Amer: 30 mL/min — ABNORMAL LOW (ref >90)
GFR calc non Af Amer: 26 mL/min — ABNORMAL LOW (ref >90)
Potassium: 3.1 mEq/L — ABNORMAL LOW (ref 3.5–5.1)

## 2012-10-24 LAB — GLUCOSE, CAPILLARY
Glucose-Capillary: 150 mg/dL — ABNORMAL HIGH (ref 70–99)
Glucose-Capillary: 180 mg/dL — ABNORMAL HIGH (ref 70–99)

## 2012-10-24 MED ORDER — INSULIN LISPRO PROT & LISPRO (75-25 MIX) 100 UNIT/ML ~~LOC~~ SUSP: 30.0000 [IU] | Freq: Two times a day (BID) | SUBCUTANEOUS | Status: DC

## 2012-10-24 MED ORDER — POTASSIUM CHLORIDE CRYS ER 20 MEQ PO TBCR
40.0000 meq | EXTENDED_RELEASE_TABLET | Freq: Once | ORAL | Status: AC
  Administered 2012-10-24: 40 meq via ORAL
  Filled 2012-10-24: qty 2

## 2012-10-24 MED ORDER — PREDNISONE 10 MG PO TABS: 40.0000 mg | ORAL_TABLET | Freq: Every day | ORAL | Status: DC

## 2012-10-24 MED ORDER — FUROSEMIDE 40 MG PO TABS
40.0000 mg | ORAL_TABLET | Freq: Every day | ORAL | Status: DC
  Administered 2012-10-24: 40 mg via ORAL
  Filled 2012-10-24: qty 1

## 2012-10-24 MED ORDER — ASPIRIN 81 MG PO TBEC: 81.0000 mg | DELAYED_RELEASE_TABLET | Freq: Every day | ORAL | Status: DC

## 2012-10-24 MED ORDER — LEVOFLOXACIN 750 MG PO TABS: 750.0000 mg | ORAL_TABLET | ORAL | Status: DC

## 2012-10-24 NOTE — Progress Notes (Signed)
John [name] is a 79 y.o. male admitted on 4.23.14 with a past medical history of, diabetes, on insulin, chronic GI bleeding, obesity, hypertension, obstructive sleep apnea, diastolic congestive heart failure, chronic kidney disease, stage III, who was in his usual state of health about 3 weeks ago, when he started developing a cough. Patient is a poor historian. Wasn't able to give specifics regarding his symptoms. He mentioned that he was able to cough up some sputum which was yellowish in color initially, and subsequently, greenish. Denies any blood in the sputum. Also has noticed leg swelling. However, he tells me, that it is no worse than normal. Has had fever and chills. However, did not check his temperature. Has had profound weakness. He also developed shortness of breath, initially with exertion, and, subsequently, even at rest. He had wheezing as well. He had chest pain across his chest for the last 3 weeks on and off. Unable to describe it any further. He went to see his PCP about 2 weeks ago, and he was prescribed antibiotics. He does not know the name of the antibiotic. Subsequently, after he finished that course he was prescribed Augmentin. He continues to take the antibiotic. He tells me that the symptoms have somewhat improved but the shortness of breath persists. He's had nausea without any vomiting. He feels sometimes that the food would not go down when he tries to swallow. He has received Lasix in the emergency department and hasn't experienced much improvement. He was found to be hypoxic with saturations in the mid-80s which improved with application of oxygen. He denies being on home oxygenation.  Patient evaluated for community based chronic disease management services with [facility] Care Management Program as a benefit of patient's [employer]. Patient will receive a post discharge transition of care call and will be evaluated for monthly home visits for assessments and disease process  education. Spoke with patient by phone to explain [facility] Care Management services.  He feels that he will benefit from some pneumonia education management and follow up.  He is able to take his medications consistently and can afford them.  It is noted that he needs additional education on when to report symptoms to his PCP.  Made inpatient Case Management aware that [facility] Care Management following. Of note, [facility] Care Management services does not replace or interfere with any services that are arranged by inpatient case management or social work.  For additional questions or referrals please contact Anibal [name] BSN RN [facility] Liaison at [phone].

## 2012-10-24 NOTE — Discharge Summary (Signed)
Physician Discharge Summary  John [name] WUJ:[id number] DOB: [birth year] DOA: 10/19/2012  PCP: Colette Ribas, MD  Admit date: 10/19/2012 Discharge date: 10/24/2012  Time spent: 40 minutes minutes  Recommendations for Outpatient Follow-up:  1. Followup with primary care physician in 2 weeks 2. Outpatient pulmonary function tests  Discharge Diagnoses:  Principal Problem:   Dyspnea Active Problems:   HYPERTENSION   GASTROINTESTINAL HEMORRHAGE, HX OF   Weakness generalized   CKD (chronic kidney disease), stage III   BPH (benign prostatic hypertrophy)   DM type 2 (diabetes mellitus, type 2)   Acute on chronic diastolic CHF (congestive heart failure)   CAP (community acquired pneumonia)   Acute respiratory failure with hypoxia   Discharge Condition: Improved  Diet recommendation: Low salt, low carb  Filed Weights   10/22/12 0602 10/23/12 0522 10/24/12 0522  Weight: 116.801 kg (257 lb 8 oz) 114.397 kg (252 lb 3.2 oz) 115.667 kg (255 lb)    History of present illness:  John [name] is a 79 y.o. male with a past medical history of, diabetes, on insulin, chronic GI bleeding, obesity, hypertension, obstructive sleep apnea, diastolic congestive heart failure, chronic kidney disease, stage III, who was in his usual state of health about 3 weeks ago, when he started developing a cough. Patient is a poor historian. Wasn't able to give specifics regarding his symptoms. He mentioned that he was able to cough up some sputum which was yellowish in color initially, and subsequently, greenish. Denies any blood in the sputum. Also has noticed leg swelling. However, he tells me, that it is no worse than normal. Has had fever and chills. However, did not check his temperature. Has had profound weakness. He also developed shortness of breath, initially with exertion, and, subsequently, even at rest. He had wheezing as well. He had chest pain across his chest for the last 3 weeks on and off. Unable  to describe it any further. He went to see his PCP about 2 weeks ago, and he was prescribed antibiotics. He does not know the name of the antibiotic. Subsequently, after he finished that course he was prescribed Augmentin. He continues to take the antibiotic. He tells me that the symptoms have somewhat improved but the shortness of breath persists. He's had nausea without any vomiting. He feels sometimes that the food would not go down when he tries to swallow. He has received Lasix in the emergency department and hasn't experienced much improvement. He was found to be hypoxic with saturations in the mid-80s which improved with application of oxygen. He denies being on home oxygenation.   Hospital Course:  This gentleman was admitted to the hospital shortness of breath. He was found to have evidence of a possible pneumonia on imaging. He was started on empiric antibiotics. He also had evidence of volume overload with peripheral edema and elevated BNP. He was also started on Lasix. The patient has had good urine output, he was continued on antibiotics and has been afebrile. He continued to be short of breath. VQ scan was found to be low probability for pulmonary embolus. He does not have diminished breath sounds with wheezing. He was started on IV steroids and had an improvement in his respiratory status. He may have an underlying COPD. He would benefit from pulmonary function tests as an outpatient. His antibiotics has been de escalated to Levaquin. He is on oral Lasix and he's been placed on prednisone taper. He is ambulating the halls maintain saturations at 93% on  room air. He feels significantly improved. He does have chronic kidney disease and his creatinine is near his baseline. He is felt stable for discharge home today. He's been set up with [facility] as well as home health services.  Procedures:  none  Consultations:  none  Discharge Exam: Filed Vitals:   10/24/12 0652 10/24/12 1300 10/24/12 1414  10/24/12 1451  BP:    176/70  Pulse:    72  Temp:    97.8 F (36.6 C)  TempSrc:      Resp:    20  Height:      Weight:      SpO2: 97% 91% 92% 92%    General: No acute distress Cardiovascular: S1, S2, regular rate and rhythm Respiratory: Clear to auscultation bilaterally  Discharge Instructions  Discharge Orders   Future Orders Complete By Expires     Call MD for:  difficulty breathing, headache or visual disturbances  As directed     Call MD for:  temperature >100.4  As directed     Consult to [facility] Management  As directed     Comments:      Pt has minimal family support and risk for readmit for CHF. PCP John [name]    Questions:      Reason for consult:  CHF    Expected date of contact:  1-3 days (reserved for hospital discharges)    Diagnoses of:  Heart Failure    Diet - low sodium heart healthy  As directed     Diet Carb Modified  As directed     Increase activity slowly  As directed         Medication List    STOP taking these medications       amoxicillin-clavulanate 875-125 MG per tablet  Commonly known as:  AUGMENTIN     lisinopril 10 MG tablet  Commonly known as:  PRINIVIL,ZESTRIL     ONE-A-DAY 50 PLUS PO      TAKE these medications       amLODipine 10 MG tablet  Commonly known as:  NORVASC  Take 5 mg by mouth daily.     aspirin 81 MG EC tablet  Take 1 tablet (81 mg total) by mouth daily.     benzonatate 200 MG capsule  Commonly known as:  TESSALON  Take 200 mg by mouth 3 (three) times daily as needed for cough. Starting 10/14/2012 for 10 days.     ferrous sulfate 325 (65 FE) MG tablet  Take 325 mg by mouth daily with breakfast.     furosemide 40 MG tablet  Commonly known as:  LASIX  Take 20 mg by mouth 2 (two) times daily.     insulin lispro protamine-lispro (75-25) 100 UNIT/ML Susp  Commonly known as:  HUMALOG MIX 75/25 KWIKPEN  Inject 30 Units into the skin 2 (two) times daily with a meal. 70 units in am, 60 units in pm     levofloxacin  750 MG tablet  Commonly known as:  LEVAQUIN  Take 1 tablet (750 mg total) by mouth every other day.     nebivolol 10 MG tablet  Commonly known as:  BYSTOLIC  Take 10 mg by mouth every morning.     pantoprazole 40 MG tablet  Commonly known as:  PROTONIX  Take 40 mg by mouth at bedtime.     predniSONE 10 MG tablet  Commonly known as:  DELTASONE  Take 4 tablets (40 mg total) by mouth daily with  breakfast. Take 40mg  po daily for 2 days then 30mg  po daily for 2 days then 20mg  po daily for 2 days then 10mg  po daily for 2 days then stop     TRILIPIX 135 MG capsule  Generic drug:  Choline Fenofibrate  Take 135 mg by mouth at bedtime.     Vitamin D 2000 UNITS Caps  Take 1 capsule by mouth daily.          The results of significant diagnostics from this hospitalization (including imaging, microbiology, ancillary and laboratory) are listed below for reference.    Significant Diagnostic Studies: Dg Chest 2 View  10/19/2012  *RADIOLOGY REPORT*  Clinical Data: 79 year old male with shortness of breath. Sleep apnea.  Diabetes.  CHEST - 2 VIEW  Comparison: 02/18/2011.  Findings: Lower lung volumes.  Patchy bibasilar opacity.  Cannot exclude small pleural effusions.  Pulmonary vascularity mildly increased.  No overt edema.  Stable mild cardiomegaly. Visualized tracheal air column is within normal limits.  IMPRESSION: Lower lung volumes with left greater than right bibasilar pulmonary opacity.  Favor atelectasis but left lower lobe pneumonia not excluded.  Small pleural effusions and increased vascular congestion.   Original Report Authenticated By: Erskine Speed, M.D.    Nm Pulmonary Perf And Vent  10/20/2012  *RADIOLOGY REPORT*  Clinical Data: Shortness of breath and chest pain  NM PULMONARY VENTILATION AND PERFUSION SCAN  Views:  Anterior, posterior, left lateral, right lateral, RPO, LPO, RAO, LAO - ventilation and perfusion  Radiopharmaceutical: Technetium 12m DTPA - ventilation; technetium 41m  macroaggregated albumin - perfusion  Dose:  40.0 mCi - ventilation; 5.2 mCi - perfusion  Route of administration:  Inhalation - ventilation; intravenous - perfusion    Comparison: Prior ventilation and perfusion lung scan October 24, 2010 and chest mammographic examinations October 19, 2012  Findings: On the ventilation study, there is homogeneous and symmetric uptake of radiotracer bilaterally.  On the perfusion study, there is homogeneous and symmetric uptake of radiotracer bilaterally.  There is no ventilation / perfusion mismatch on either side.  IMPRESSION:  No appreciable ventilation-perfusion defects.  Very low probability of pulmonary embolus.   Original Report Authenticated By: Bretta Bang, M.D.    US Venous Img Lower Bilateral  10/20/2012  *RADIOLOGY REPORT*  Clinical Data: Bilateral lower extremity edema.  BILATERAL LOWER EXTREMITY VENOUS DUPLEX ULTRASOUND  Technique:  Gray-scale sonography with graded compression, as well as color Doppler and duplex ultrasound, were performed to evaluate the deep venous system of both lower extremities from the level of the common femoral vein through the popliteal and proximal calf veins.  Spectral Doppler was utilized to evaluate flow at rest and with distal augmentation maneuvers.  Comparison:  None.  Findings:  Normal compressibility of bilateral common femoral, superficial femoral, and popliteal veins is demonstrated, as well as the visualized proximal calf veins.  No filling defects to suggest DVT on grayscale or color Doppler imaging.  Doppler waveforms show normal direction of venous flow, normal respiratory phasicity and response to augmentation.  No evidence of superficial thrombophlebitis or abnormal fluid collection.  Prominent subcutaneous edema is noted in both lower legs, especially around the ankles.  IMPRESSION: No evidence of deep vein thrombosis in either lower extremity.   Original Report Authenticated By: Irish Lack, M.D.    Dg Chest Port 1  View  10/22/2012  *RADIOLOGY REPORT*  Clinical Data: Shortness of breath.  PORTABLE CHEST - 1 VIEW  Comparison: 10/19/2012  Findings: Retrocardiac airspace opacity continues to obscure the  left hemidiaphragm.  Borderline cardiomegaly.  Indistinct band-like opacity along the right lung base.  Slight hilar prominence bilaterally is probably vascular.  IMPRESSION:  1.  Continued low lung volumes, with airspace opacities in the lung bases, left greater than right, potentially from atelectasis or pneumonia. 2.  Borderline cardiomegaly. 3.  Mild hilar prominence, probably vascular rather than due to adenopathy.   Original Report Authenticated By: Gaylyn Rong, M.D.     Microbiology: No results found for this or any previous visit (from the past 240 hour(s)).   Labs: Basic Metabolic Panel:  Recent Labs Lab 10/20/12 0220 10/21/12 0529 10/22/12 1000 10/23/12 0620 10/24/12 0456  NA 142 142 135 140 143  K 3.5 3.2* 3.8 3.9 3.1*  CL 101 97 94* 99 100  CO2 33* 37* 33* 32 34*  GLUCOSE 151* 136* 271* 256* 148*  BUN 25* 30* 38* 52* 60*  CREATININE 2.14* 2.63* 2.16* 2.27* 2.25*  CALCIUM 8.8 8.9 9.1 9.4 9.0   Liver Function Tests:  Recent Labs Lab 10/19/12 1618 10/20/12 0220  AST 30 28  ALT 30 25  ALKPHOS 59 52  BILITOT 0.3 0.4  PROT 6.8 6.4  ALBUMIN 3.2* 2.9*   No results found for this basename: LIPASE, AMYLASE,  in the last 168 hours No results found for this basename: AMMONIA,  in the last 168 hours CBC:  Recent Labs Lab 10/19/12 1618 10/19/12 1643 10/20/12 0220 10/21/12 0529  WBC 11.6*  --  11.8* 12.3*  NEUTROABS 9.3*  --   --   --   HGB 12.0* 12.6* 11.8* 12.1*  HCT 36.3* 37.0* 37.3* 38.0*  MCV 89.4  --  91.0 92.0  PLT 322  --  344 387   Cardiac Enzymes:  Recent Labs Lab 10/19/12 2030 10/20/12 0220 10/20/12 0815  TROPONINI <0.30 <0.30 <0.30   BNP: BNP (last 3 results)  Recent Labs  10/19/12 1618  PROBNP 655.6*   CBG:  Recent Labs Lab 10/23/12 1155  10/23/12 1654 10/23/12 2114 10/24/12 0757 10/24/12 1146  GLUCAP 247* 263* 263* 150* 180*       Signed:  Letroy Vazguez  [facility] 10/24/2012, 4:45 PM

## 2012-10-24 NOTE — Progress Notes (Signed)
Occupational Therapy Discharge Patient Details Name: John [name] MRN: [id number] DOB: 1931/03/27 Today's Date: 10/24/2012 Time: 4098-1191 OT Time Calculation (min): 8 min  Patient discharged from OT services secondary to goals met and no further OT needs identified.  Please see latest therapy progress note for current level of functioning and progress toward goals.    Progress and discharge plan discussed with patient and/or caregiver: Patient/Caregiver agrees with plan    John [name], OTR/L,CBIS   10/24/2012, 9:15 AM

## 2012-10-24 NOTE — Progress Notes (Signed)
Occupational Therapy Treatment Patient Details Name: John [name] MRN: [id number] DOB: 01-Jun-1931 Today's Date: 10/24/2012 Time: 4098-1191 OT Time Calculation (min): 8 min  OT Assessment / Plan / Recommendation Comments on Treatment Session Patient has met all OT goals and does not need follow up Home health. Will discharge of OT services.    Follow Up Recommendations  No OT follow up                Plan All goals met and education completed, patient discharged from OT services    Precautions / Restrictions Precautions Precautions: None   Pertinent Vitals/Pain No complaints.    ADL  Grooming: Simulated;Independent Upper Body Bathing: Simulated;Independent Lower Body Bathing: Simulated;Modified independent Upper Body Dressing: Simulated;Independent Lower Body Dressing: Simulated;Modified independent Toilet Transfer: Performed;Independent Toilet Transfer Method: Stand Wellsite geologist: Regular height toilet;Grab bars Toileting - Architect and Hygiene: Performed;Independent Where Assessed - Toileting Clothing Manipulation and Hygiene: Sit on 3-in-1 or toilet Transfers/Ambulation Related to ADLs: Patient transfers at Independent level.      OT Goals ADL Goals Pt Will Perform Grooming: with modified independence;Standing at sink ADL Goal: Grooming - Progress: Met Pt Will Perform Lower Body Bathing: with modified independence ADL Goal: Lower Body Bathing - Progress: Met Pt Will Perform Lower Body Dressing: with modified independence ADL Goal: Lower Body Dressing - Progress: Met Pt Will Transfer to Toilet: with modified independence ADL Goal: Toilet Transfer - Progress: Met Pt Will Perform Toileting - Clothing Manipulation: Independently ADL Goal: Toileting - Clothing Manipulation - Progress: Met Pt Will Perform Toileting - Hygiene: Independently ADL Goal: Toileting - Hygiene - Progress: Met Arm Goals Pt Will Complete Theraband Exer:  Independently;to maintain strength;Bilateral upper extremities;2 sets;10 reps;Level 2 Theraband  Visit Information  Last OT Received On: 10/24/12    Subjective Data  Subjective: S: I've been doing a lot better. Patient Stated Goal: To go home.      Cognition  Cognition Arousal/Alertness: Awake/alert Behavior During Therapy: WFL for tasks assessed/performed Overall Cognitive Status: Within Functional Limits for tasks assessed             End of Session OT - End of Session Activity Tolerance: Patient tolerated treatment well Patient left: in chair;with call bell/phone within reach    [facility], OTR/L,CBIS   10/24/2012, 9:16 AM

## 2012-10-24 NOTE — Progress Notes (Signed)
Pt off O2. O2 sat 91% after Pt ambulated in hall and returned to chair O2 sat 92% after ambulation without O2

## 2012-10-24 NOTE — Plan of Care (Signed)
Problem: Discharge Progression Outcomes Goal: Independent ADLs or Home Health Care Outcome: Completed/Met Date Met:  10/24/12 Advance home care Goal: Other Discharge Outcomes/Goals Outcome: Completed/Met Date Met:  10/24/12 Discharged to home with family

## 2012-10-26 DIAGNOSIS — I509 Heart failure, unspecified: Secondary | ICD-10-CM | POA: Diagnosis not present

## 2012-10-26 DIAGNOSIS — E119 Type 2 diabetes mellitus without complications: Secondary | ICD-10-CM | POA: Diagnosis not present

## 2012-10-26 DIAGNOSIS — J189 Pneumonia, unspecified organism: Secondary | ICD-10-CM | POA: Diagnosis not present

## 2012-10-26 DIAGNOSIS — I5033 Acute on chronic diastolic (congestive) heart failure: Secondary | ICD-10-CM | POA: Diagnosis not present

## 2012-10-26 DIAGNOSIS — E669 Obesity, unspecified: Secondary | ICD-10-CM | POA: Diagnosis not present

## 2012-10-26 DIAGNOSIS — Z794 Long term (current) use of insulin: Secondary | ICD-10-CM | POA: Diagnosis not present

## 2012-10-26 DIAGNOSIS — I129 Hypertensive chronic kidney disease with stage 1 through stage 4 chronic kidney disease, or unspecified chronic kidney disease: Secondary | ICD-10-CM | POA: Diagnosis not present

## 2012-10-27 DIAGNOSIS — E119 Type 2 diabetes mellitus without complications: Secondary | ICD-10-CM | POA: Diagnosis not present

## 2012-10-27 DIAGNOSIS — I129 Hypertensive chronic kidney disease with stage 1 through stage 4 chronic kidney disease, or unspecified chronic kidney disease: Secondary | ICD-10-CM | POA: Diagnosis not present

## 2012-10-27 DIAGNOSIS — I5033 Acute on chronic diastolic (congestive) heart failure: Secondary | ICD-10-CM | POA: Diagnosis not present

## 2012-10-27 DIAGNOSIS — J189 Pneumonia, unspecified organism: Secondary | ICD-10-CM | POA: Diagnosis not present

## 2012-10-27 DIAGNOSIS — I509 Heart failure, unspecified: Secondary | ICD-10-CM | POA: Diagnosis not present

## 2012-10-28 DIAGNOSIS — I5033 Acute on chronic diastolic (congestive) heart failure: Secondary | ICD-10-CM | POA: Diagnosis not present

## 2012-10-28 DIAGNOSIS — E119 Type 2 diabetes mellitus without complications: Secondary | ICD-10-CM | POA: Diagnosis not present

## 2012-10-28 DIAGNOSIS — J189 Pneumonia, unspecified organism: Secondary | ICD-10-CM | POA: Diagnosis not present

## 2012-10-28 DIAGNOSIS — I509 Heart failure, unspecified: Secondary | ICD-10-CM | POA: Diagnosis not present

## 2012-10-28 DIAGNOSIS — I129 Hypertensive chronic kidney disease with stage 1 through stage 4 chronic kidney disease, or unspecified chronic kidney disease: Secondary | ICD-10-CM | POA: Diagnosis not present

## 2012-11-01 DIAGNOSIS — J189 Pneumonia, unspecified organism: Secondary | ICD-10-CM | POA: Diagnosis not present

## 2012-11-01 DIAGNOSIS — I129 Hypertensive chronic kidney disease with stage 1 through stage 4 chronic kidney disease, or unspecified chronic kidney disease: Secondary | ICD-10-CM | POA: Diagnosis not present

## 2012-11-01 DIAGNOSIS — I509 Heart failure, unspecified: Secondary | ICD-10-CM | POA: Diagnosis not present

## 2012-11-01 DIAGNOSIS — E119 Type 2 diabetes mellitus without complications: Secondary | ICD-10-CM | POA: Diagnosis not present

## 2012-11-01 DIAGNOSIS — I5033 Acute on chronic diastolic (congestive) heart failure: Secondary | ICD-10-CM | POA: Diagnosis not present

## 2012-11-03 DIAGNOSIS — J159 Unspecified bacterial pneumonia: Secondary | ICD-10-CM | POA: Diagnosis not present

## 2012-11-03 DIAGNOSIS — Z6837 Body mass index (BMI) 37.0-37.9, adult: Secondary | ICD-10-CM | POA: Diagnosis not present

## 2012-11-03 DIAGNOSIS — J449 Chronic obstructive pulmonary disease, unspecified: Secondary | ICD-10-CM | POA: Diagnosis not present

## 2012-11-03 DIAGNOSIS — R0602 Shortness of breath: Secondary | ICD-10-CM | POA: Diagnosis not present

## 2012-11-04 DIAGNOSIS — I129 Hypertensive chronic kidney disease with stage 1 through stage 4 chronic kidney disease, or unspecified chronic kidney disease: Secondary | ICD-10-CM | POA: Diagnosis not present

## 2012-11-04 DIAGNOSIS — E119 Type 2 diabetes mellitus without complications: Secondary | ICD-10-CM | POA: Diagnosis not present

## 2012-11-04 DIAGNOSIS — I509 Heart failure, unspecified: Secondary | ICD-10-CM | POA: Diagnosis not present

## 2012-11-04 DIAGNOSIS — J189 Pneumonia, unspecified organism: Secondary | ICD-10-CM | POA: Diagnosis not present

## 2012-11-04 DIAGNOSIS — I5033 Acute on chronic diastolic (congestive) heart failure: Secondary | ICD-10-CM | POA: Diagnosis not present

## 2012-11-09 DIAGNOSIS — I129 Hypertensive chronic kidney disease with stage 1 through stage 4 chronic kidney disease, or unspecified chronic kidney disease: Secondary | ICD-10-CM | POA: Diagnosis not present

## 2012-11-09 DIAGNOSIS — J189 Pneumonia, unspecified organism: Secondary | ICD-10-CM | POA: Diagnosis not present

## 2012-11-09 DIAGNOSIS — E119 Type 2 diabetes mellitus without complications: Secondary | ICD-10-CM | POA: Diagnosis not present

## 2012-11-09 DIAGNOSIS — I509 Heart failure, unspecified: Secondary | ICD-10-CM | POA: Diagnosis not present

## 2012-11-09 DIAGNOSIS — I5033 Acute on chronic diastolic (congestive) heart failure: Secondary | ICD-10-CM | POA: Diagnosis not present

## 2012-11-10 DIAGNOSIS — E119 Type 2 diabetes mellitus without complications: Secondary | ICD-10-CM | POA: Diagnosis not present

## 2012-11-10 DIAGNOSIS — E1159 Type 2 diabetes mellitus with other circulatory complications: Secondary | ICD-10-CM | POA: Diagnosis not present

## 2012-11-14 DIAGNOSIS — R809 Proteinuria, unspecified: Secondary | ICD-10-CM | POA: Diagnosis not present

## 2012-11-14 DIAGNOSIS — Z79899 Other long term (current) drug therapy: Secondary | ICD-10-CM | POA: Diagnosis not present

## 2012-11-14 DIAGNOSIS — N189 Chronic kidney disease, unspecified: Secondary | ICD-10-CM | POA: Diagnosis not present

## 2012-11-14 DIAGNOSIS — E559 Vitamin D deficiency, unspecified: Secondary | ICD-10-CM | POA: Diagnosis not present

## 2012-11-14 DIAGNOSIS — D649 Anemia, unspecified: Secondary | ICD-10-CM | POA: Diagnosis not present

## 2012-11-23 DIAGNOSIS — I10 Essential (primary) hypertension: Secondary | ICD-10-CM | POA: Diagnosis not present

## 2012-11-23 DIAGNOSIS — R809 Proteinuria, unspecified: Secondary | ICD-10-CM | POA: Diagnosis not present

## 2012-11-23 DIAGNOSIS — I509 Heart failure, unspecified: Secondary | ICD-10-CM | POA: Diagnosis not present

## 2012-11-28 DIAGNOSIS — I509 Heart failure, unspecified: Secondary | ICD-10-CM | POA: Diagnosis not present

## 2012-11-28 DIAGNOSIS — E119 Type 2 diabetes mellitus without complications: Secondary | ICD-10-CM | POA: Diagnosis not present

## 2012-11-28 DIAGNOSIS — I129 Hypertensive chronic kidney disease with stage 1 through stage 4 chronic kidney disease, or unspecified chronic kidney disease: Secondary | ICD-10-CM | POA: Diagnosis not present

## 2012-11-28 DIAGNOSIS — J189 Pneumonia, unspecified organism: Secondary | ICD-10-CM | POA: Diagnosis not present

## 2012-11-28 DIAGNOSIS — I5033 Acute on chronic diastolic (congestive) heart failure: Secondary | ICD-10-CM | POA: Diagnosis not present

## 2012-12-02 DIAGNOSIS — E669 Obesity, unspecified: Secondary | ICD-10-CM | POA: Diagnosis not present

## 2012-12-02 DIAGNOSIS — I10 Essential (primary) hypertension: Secondary | ICD-10-CM | POA: Diagnosis not present

## 2012-12-02 DIAGNOSIS — Z6837 Body mass index (BMI) 37.0-37.9, adult: Secondary | ICD-10-CM | POA: Diagnosis not present

## 2012-12-02 DIAGNOSIS — M67919 Unspecified disorder of synovium and tendon, unspecified shoulder: Secondary | ICD-10-CM | POA: Diagnosis not present

## 2012-12-20 DIAGNOSIS — J189 Pneumonia, unspecified organism: Secondary | ICD-10-CM | POA: Diagnosis not present

## 2012-12-20 DIAGNOSIS — I5033 Acute on chronic diastolic (congestive) heart failure: Secondary | ICD-10-CM | POA: Diagnosis not present

## 2012-12-20 DIAGNOSIS — I129 Hypertensive chronic kidney disease with stage 1 through stage 4 chronic kidney disease, or unspecified chronic kidney disease: Secondary | ICD-10-CM | POA: Diagnosis not present

## 2012-12-20 DIAGNOSIS — I509 Heart failure, unspecified: Secondary | ICD-10-CM | POA: Diagnosis not present

## 2012-12-20 DIAGNOSIS — E119 Type 2 diabetes mellitus without complications: Secondary | ICD-10-CM | POA: Diagnosis not present

## 2013-01-05 DIAGNOSIS — M79609 Pain in unspecified limb: Secondary | ICD-10-CM | POA: Diagnosis not present

## 2013-01-05 DIAGNOSIS — L600 Ingrowing nail: Secondary | ICD-10-CM | POA: Diagnosis not present

## 2013-01-09 DIAGNOSIS — E669 Obesity, unspecified: Secondary | ICD-10-CM | POA: Diagnosis not present

## 2013-01-09 DIAGNOSIS — I4510 Unspecified right bundle-branch block: Secondary | ICD-10-CM | POA: Diagnosis not present

## 2013-01-09 DIAGNOSIS — R0602 Shortness of breath: Secondary | ICD-10-CM | POA: Diagnosis not present

## 2013-01-09 DIAGNOSIS — J449 Chronic obstructive pulmonary disease, unspecified: Secondary | ICD-10-CM | POA: Diagnosis not present

## 2013-01-10 ENCOUNTER — Encounter: Payer: Self-pay | Admitting: Cardiovascular Disease

## 2013-01-13 ENCOUNTER — Other Ambulatory Visit: Payer: Self-pay | Admitting: Cardiovascular Disease

## 2013-01-13 DIAGNOSIS — R5381 Other malaise: Secondary | ICD-10-CM | POA: Diagnosis not present

## 2013-01-13 DIAGNOSIS — R6889 Other general symptoms and signs: Secondary | ICD-10-CM | POA: Diagnosis not present

## 2013-01-13 DIAGNOSIS — R0602 Shortness of breath: Secondary | ICD-10-CM | POA: Diagnosis not present

## 2013-01-13 LAB — CBC WITH DIFFERENTIAL/PLATELET
Basophils Absolute: 0 10*3/uL (ref 0.0–0.1)
Basophils Relative: 0 % (ref 0–1)
MCHC: 33.6 g/dL (ref 30.0–36.0)
Monocytes Absolute: 0.8 10*3/uL (ref 0.1–1.0)
Neutro Abs: 6.3 10*3/uL (ref 1.7–7.7)
Neutrophils Relative %: 68 % (ref 43–77)
Platelets: 335 10*3/uL (ref 150–400)
RDW: 16.7 % — ABNORMAL HIGH (ref 11.5–15.5)

## 2013-01-14 LAB — COMPREHENSIVE METABOLIC PANEL
AST: 20 U/L (ref 0–37)
Albumin: 4 g/dL (ref 3.5–5.2)
Alkaline Phosphatase: 43 U/L (ref 39–117)
BUN: 28 mg/dL — ABNORMAL HIGH (ref 6–23)
Potassium: 4 mEq/L (ref 3.5–5.3)
Sodium: 143 mEq/L (ref 135–145)

## 2013-02-13 ENCOUNTER — Other Ambulatory Visit: Payer: Self-pay | Admitting: Cardiovascular Disease

## 2013-02-13 DIAGNOSIS — R6889 Other general symptoms and signs: Secondary | ICD-10-CM | POA: Diagnosis not present

## 2013-02-14 LAB — COMPREHENSIVE METABOLIC PANEL
Albumin: 3.9 g/dL (ref 3.5–5.2)
Alkaline Phosphatase: 40 U/L (ref 39–117)
BUN: 30 mg/dL — ABNORMAL HIGH (ref 6–23)
Calcium: 9.8 mg/dL (ref 8.4–10.5)
Glucose, Bld: 88 mg/dL (ref 70–99)
Potassium: 4 mEq/L (ref 3.5–5.3)

## 2013-03-02 DIAGNOSIS — B354 Tinea corporis: Secondary | ICD-10-CM | POA: Diagnosis not present

## 2013-03-02 DIAGNOSIS — Z6837 Body mass index (BMI) 37.0-37.9, adult: Secondary | ICD-10-CM | POA: Diagnosis not present

## 2013-03-06 ENCOUNTER — Other Ambulatory Visit: Payer: Self-pay | Admitting: *Deleted

## 2013-03-06 DIAGNOSIS — N189 Chronic kidney disease, unspecified: Secondary | ICD-10-CM | POA: Diagnosis not present

## 2013-03-06 DIAGNOSIS — D649 Anemia, unspecified: Secondary | ICD-10-CM | POA: Diagnosis not present

## 2013-03-06 DIAGNOSIS — R809 Proteinuria, unspecified: Secondary | ICD-10-CM | POA: Diagnosis not present

## 2013-03-06 DIAGNOSIS — E559 Vitamin D deficiency, unspecified: Secondary | ICD-10-CM | POA: Diagnosis not present

## 2013-03-06 DIAGNOSIS — Z79899 Other long term (current) drug therapy: Secondary | ICD-10-CM | POA: Diagnosis not present

## 2013-03-06 DIAGNOSIS — I10 Essential (primary) hypertension: Secondary | ICD-10-CM | POA: Diagnosis not present

## 2013-03-06 MED ORDER — LOSARTAN POTASSIUM 50 MG PO TABS: 50.0000 mg | ORAL_TABLET | Freq: Every day | ORAL | Status: DC

## 2013-03-06 NOTE — Telephone Encounter (Signed)
Rx was sent to pharmacy electronically via [employer] 

## 2013-03-08 DIAGNOSIS — E559 Vitamin D deficiency, unspecified: Secondary | ICD-10-CM | POA: Diagnosis not present

## 2013-03-08 DIAGNOSIS — R809 Proteinuria, unspecified: Secondary | ICD-10-CM | POA: Diagnosis not present

## 2013-03-08 DIAGNOSIS — I10 Essential (primary) hypertension: Secondary | ICD-10-CM | POA: Diagnosis not present

## 2013-03-08 DIAGNOSIS — N184 Chronic kidney disease, stage 4 (severe): Secondary | ICD-10-CM | POA: Diagnosis not present

## 2013-03-16 DIAGNOSIS — E119 Type 2 diabetes mellitus without complications: Secondary | ICD-10-CM | POA: Diagnosis not present

## 2013-03-16 DIAGNOSIS — E1149 Type 2 diabetes mellitus with other diabetic neurological complication: Secondary | ICD-10-CM | POA: Diagnosis not present

## 2013-04-05 DIAGNOSIS — Z23 Encounter for immunization: Secondary | ICD-10-CM | POA: Diagnosis not present

## 2013-05-02 ENCOUNTER — Encounter: Payer: Self-pay | Admitting: Gastroenterology

## 2013-05-02 ENCOUNTER — Encounter (INDEPENDENT_AMBULATORY_CARE_PROVIDER_SITE_OTHER): Payer: Self-pay

## 2013-05-02 ENCOUNTER — Ambulatory Visit (INDEPENDENT_AMBULATORY_CARE_PROVIDER_SITE_OTHER): Payer: Medicare Other | Admitting: Gastroenterology

## 2013-05-02 VITALS — BP 152/81 | HR 82 | Temp 97.1°F | Wt 250.8 lb

## 2013-05-02 DIAGNOSIS — K5900 Constipation, unspecified: Secondary | ICD-10-CM

## 2013-05-02 DIAGNOSIS — K219 Gastro-esophageal reflux disease without esophagitis: Secondary | ICD-10-CM

## 2013-05-02 DIAGNOSIS — R1310 Dysphagia, unspecified: Secondary | ICD-10-CM

## 2013-05-02 DIAGNOSIS — R1314 Dysphagia, pharyngoesophageal phase: Secondary | ICD-10-CM | POA: Diagnosis not present

## 2013-05-02 NOTE — Progress Notes (Signed)
Primary Care Physician: Colette Ribas, MD  Primary Gastroenterologist:  Roetta Sessions, MD   Chief Complaint  Patient presents with  . Follow-up    HPI: John [name] is a 79 y.o. male here for one-year followup visit. He was last seen in November 2013. History of chronic constipation, GERD, GI bleeding previously on NSAIDs.  No heartburn. Pain with deep breath at times. Some in right side and left side. Not sure if on pantoprazole BID. Some problems swallowing. Feels like food sticking. BM most days. Occasional correctol. No melena, brbpr. Stools dark on iron. C/O lot of gas. Some gas-X. No abdominal pain. Feels tight some time.   Current Outpatient Prescriptions  Medication Sig Dispense Refill  . amLODipine (NORVASC) 10 MG tablet Take 5 mg by mouth daily.       Marland Kitchen aspirin EC 81 MG EC tablet Take 1 tablet (81 mg total) by mouth daily.  30 tablet  1  . benzonatate (TESSALON) 200 MG capsule Take 200 mg by mouth 3 (three) times daily as needed for cough. Starting 10/14/2012 for 10 days.      . Cholecalciferol (VITAMIN D) 2000 UNITS CAPS Take 1 capsule by mouth daily.      . Choline Fenofibrate (TRILIPIX) 135 MG capsule Take 135 mg by mouth at bedtime.       . ferrous sulfate 325 (65 FE) MG tablet Take 325 mg by mouth daily with breakfast.      . furosemide (LASIX) 40 MG tablet Take 20 mg by mouth 2 (two) times daily.       . insulin lispro protamine-lispro (HUMALOG MIX 75/25 KWIKPEN) (75-25) 100 UNIT/ML SUSP Inject 30 Units into the skin 2 (two) times daily with a meal. 70 units in am, 60 units in pm  10 mL    . losartan (COZAAR) 50 MG tablet Take 1 tablet (50 mg total) by mouth daily.  90 tablet  3  . nebivolol (BYSTOLIC) 10 MG tablet Take 10 mg by mouth every morning.        . pantoprazole (PROTONIX) 40 MG tablet Take 40 mg by mouth at bedtime.        No current facility-administered medications for this visit.    Allergies as of 05/02/2013 - Review Complete 05/02/2013  Allergen  Reaction Noted  . Betadine [povidone iodine]  02/13/2011  . Latex    . Povidone    . Propoxyphene hcl    . Sulfonamide derivatives     Past Medical History  Diagnosis Date  . GI bleed     2006, TCS/TI showed small polyp. EGD showed erosive antral gastritis with two polyp, one oozing and tx. HP neg. SB capsule shoed few jejunal ulcers with bleeding (naproxen and ASA)  . GI bleed     02/2010, EGD->pancreatitc rest, fundal gland polyps,  no bleeding. TCS->blood-tinged colonic effluent throughout the colon without bleeding lesion found, nl TI. SB capsule->SB erosions, nonbleeding, incomplete study. Patient on naproxyn.   . DM (diabetes mellitus)   . Pneumonia   . Hyperlipidemia   . HTN (hypertension)   . Stroke   . GERD (gastroesophageal reflux disease)   . Poor vision     left eye  . Nephrolithiasis   . Sleep apnea   . Obesity   . Ischemic colitis, enteritis, or enterocolitis     flex sig 01/2010  . Chronic renal insufficiency   . Gastric AVM 10/24/10    GI bleed EGD w/ bicap and hemoclip placement, 2  AVMs, presented w/bright red rectal bleeding  . GI bleed 10/27/10    ileocolonoscopy/GIVENS capsule study by Dr Rourk->bloody colonic effluent throughout colon but no lesion identified, TA @ hepatic flexure, fresh blood in dital SB on capsule. Nuc med RBC study negative  . Tubular adenoma of colon 10/27/10    on colonoscopy Dr Jena Gauss   Past Surgical History  Procedure Laterality Date  . Back surgery    . Transurethral resection of prostate    . Cataract extraction      bilateral  . Appendectomy    . Cholecystectomy    . Elbow surgery    . Esophagogastroduodenoscopy  10/23/10    Dr. Gery Pray arteriovenous malformations,GI bleed- capsule  . Colonoscopy  10/27/10    Dr. Allayne Butcher adenoma, normal rectum bloody colonic effluent throughout the colon with out bleeding lesion seen.     ROS:  General: Negative for anorexia, weight loss, fever, chills, fatigue, weakness. ENT:  Negative for hoarseness, nasal congestion. CV: Negative for chest pain, angina, palpitations, dyspnea on exertion, peripheral edema.  Respiratory: Negative for dyspnea at rest, dyspnea on exertion, cough, sputum, wheezing.  GI: See history of present illness. GU:  Negative for dysuria, hematuria, urinary incontinence, urinary frequency, nocturnal urination.  Endo: Negative for unusual weight change.    Physical Examination:   BP 152/81  Pulse 82  Temp(Src) 97.1 F (36.2 C) (Oral)  Wt 250 lb 12.8 oz (113.762 kg)  General: Well-nourished, well-developed in no acute distress.  Eyes: No icterus. Mouth: Oropharyngeal mucosa moist and pink , no lesions erythema or exudate. Lungs: Clear to auscultation bilaterally.  Heart: Regular rate and rhythm, no murmurs rubs or gallops.  Abdomen: Bowel sounds are normal, nontender, nondistended, no hepatosplenomegaly or masses, no abdominal bruits or hernia , no rebound or guarding.   Extremities: Trace lower extremity edema. No clubbing or deformities. Neuro: Alert and oriented x 4   Skin: Warm and dry, no jaundice.   Psych: Alert and cooperative, normal mood and affect.  Labs:  Lab Results  Component Value Date   WBC 9.3 01/13/2013   HGB 13.7 01/13/2013   HCT 40.8 01/13/2013   MCV 84.8 01/13/2013   PLT 335 01/13/2013   Lab Results  Component Value Date   CREATININE 2.33* 02/13/2013   BUN 30* 02/13/2013   NA 140 02/13/2013   K 4.0 02/13/2013   CL 104 02/13/2013   CO2 24 02/13/2013   Lab Results  Component Value Date   ALT 15 02/13/2013   AST 19 02/13/2013   ALKPHOS 40 02/13/2013   BILITOT 0.4 02/13/2013    Imaging Studies: No results found.

## 2013-05-02 NOTE — Patient Instructions (Signed)
1. Esophagram has been scheduled to evaluate your swallowing difficulties.

## 2013-05-04 NOTE — Assessment & Plan Note (Signed)
Doing ok at this time

## 2013-05-04 NOTE — Assessment & Plan Note (Signed)
Normal esophagus in 2012 on EGD. Evaluate dysphagia with BPE.

## 2013-05-04 NOTE — Assessment & Plan Note (Signed)
Well-controlled. Confirmed with daughter, Williams Lions. Patient is on protonix once daily. Continue current regimen.

## 2013-05-05 ENCOUNTER — Ambulatory Visit (HOSPITAL_COMMUNITY)
Admission: RE | Admit: 2013-05-05 | Discharge: 2013-05-05 | Disposition: A | Discharge status: Home / Self Care | Payer: Medicare Other | Source: Ambulatory Visit | Attending: Gastroenterology | Admitting: Gastroenterology

## 2013-05-05 DIAGNOSIS — R1319 Other dysphagia: Secondary | ICD-10-CM | POA: Insufficient documentation

## 2013-05-05 DIAGNOSIS — K219 Gastro-esophageal reflux disease without esophagitis: Secondary | ICD-10-CM

## 2013-05-05 DIAGNOSIS — K228 Other specified diseases of esophagus: Secondary | ICD-10-CM | POA: Diagnosis not present

## 2013-05-05 DIAGNOSIS — K224 Dyskinesia of esophagus: Secondary | ICD-10-CM | POA: Diagnosis not present

## 2013-05-05 DIAGNOSIS — R1310 Dysphagia, unspecified: Secondary | ICD-10-CM

## 2013-05-08 NOTE — Progress Notes (Signed)
cc'd to pcp 

## 2013-05-10 NOTE — Progress Notes (Signed)
Quick Note:  Please let patient know his esophagram shows age related esophageal dysmotility. No strictures noted. Would not recommend EGD with dilation for these findings. He should chew food thoroughly, use plenty of liquid when taking pills and eating. Stay upright 30 minutes after eating and taking medicaitons. ______

## 2013-05-18 DIAGNOSIS — L609 Nail disorder, unspecified: Secondary | ICD-10-CM | POA: Diagnosis not present

## 2013-05-18 DIAGNOSIS — I70209 Unspecified atherosclerosis of native arteries of extremities, unspecified extremity: Secondary | ICD-10-CM | POA: Diagnosis not present

## 2013-05-29 ENCOUNTER — Telehealth: Payer: Self-pay | Admitting: *Deleted

## 2013-05-29 DIAGNOSIS — R809 Proteinuria, unspecified: Secondary | ICD-10-CM | POA: Diagnosis not present

## 2013-05-29 DIAGNOSIS — N189 Chronic kidney disease, unspecified: Secondary | ICD-10-CM | POA: Diagnosis not present

## 2013-05-29 DIAGNOSIS — Z79899 Other long term (current) drug therapy: Secondary | ICD-10-CM | POA: Diagnosis not present

## 2013-05-29 DIAGNOSIS — I10 Essential (primary) hypertension: Secondary | ICD-10-CM | POA: Diagnosis not present

## 2013-05-29 DIAGNOSIS — E559 Vitamin D deficiency, unspecified: Secondary | ICD-10-CM | POA: Diagnosis not present

## 2013-05-29 NOTE — Telephone Encounter (Signed)
Called pt-LMOM with results.  

## 2013-05-29 NOTE — Telephone Encounter (Signed)
Pt was returning a call. Please advise [phone]

## 2013-05-31 DIAGNOSIS — N184 Chronic kidney disease, stage 4 (severe): Secondary | ICD-10-CM | POA: Diagnosis not present

## 2013-05-31 DIAGNOSIS — R809 Proteinuria, unspecified: Secondary | ICD-10-CM | POA: Diagnosis not present

## 2013-05-31 DIAGNOSIS — I10 Essential (primary) hypertension: Secondary | ICD-10-CM | POA: Diagnosis not present

## 2013-06-12 DIAGNOSIS — E119 Type 2 diabetes mellitus without complications: Secondary | ICD-10-CM | POA: Diagnosis not present

## 2013-06-12 DIAGNOSIS — H2510 Age-related nuclear cataract, unspecified eye: Secondary | ICD-10-CM | POA: Diagnosis not present

## 2013-06-12 DIAGNOSIS — H3530 Unspecified macular degeneration: Secondary | ICD-10-CM | POA: Diagnosis not present

## 2013-06-12 DIAGNOSIS — H538 Other visual disturbances: Secondary | ICD-10-CM | POA: Diagnosis not present

## 2013-07-10 ENCOUNTER — Encounter (HOSPITAL_COMMUNITY): Payer: Self-pay | Admitting: Emergency Medicine

## 2013-07-10 ENCOUNTER — Emergency Department (HOSPITAL_COMMUNITY): Payer: Medicare Other

## 2013-07-10 ENCOUNTER — Emergency Department (HOSPITAL_COMMUNITY)
Admission: EM | Admit: 2013-07-10 | Discharge: 2013-07-10 | Disposition: A | Discharge status: Home / Self Care | Payer: Medicare Other | Attending: Emergency Medicine | Admitting: Emergency Medicine

## 2013-07-10 DIAGNOSIS — E669 Obesity, unspecified: Secondary | ICD-10-CM | POA: Diagnosis not present

## 2013-07-10 DIAGNOSIS — Z87442 Personal history of urinary calculi: Secondary | ICD-10-CM | POA: Diagnosis not present

## 2013-07-10 DIAGNOSIS — Z8673 Personal history of transient ischemic attack (TIA), and cerebral infarction without residual deficits: Secondary | ICD-10-CM | POA: Diagnosis not present

## 2013-07-10 DIAGNOSIS — J069 Acute upper respiratory infection, unspecified: Secondary | ICD-10-CM | POA: Diagnosis not present

## 2013-07-10 DIAGNOSIS — Z91040 Latex allergy status: Secondary | ICD-10-CM | POA: Diagnosis not present

## 2013-07-10 DIAGNOSIS — E119 Type 2 diabetes mellitus without complications: Secondary | ICD-10-CM | POA: Insufficient documentation

## 2013-07-10 DIAGNOSIS — K219 Gastro-esophageal reflux disease without esophagitis: Secondary | ICD-10-CM | POA: Diagnosis not present

## 2013-07-10 DIAGNOSIS — R630 Anorexia: Secondary | ICD-10-CM | POA: Diagnosis not present

## 2013-07-10 DIAGNOSIS — R52 Pain, unspecified: Secondary | ICD-10-CM | POA: Insufficient documentation

## 2013-07-10 DIAGNOSIS — Z860100 Personal history of colon polyps, unspecified: Secondary | ICD-10-CM | POA: Insufficient documentation

## 2013-07-10 DIAGNOSIS — Z794 Long term (current) use of insulin: Secondary | ICD-10-CM | POA: Insufficient documentation

## 2013-07-10 DIAGNOSIS — R072 Precordial pain: Secondary | ICD-10-CM | POA: Diagnosis not present

## 2013-07-10 DIAGNOSIS — R110 Nausea: Secondary | ICD-10-CM | POA: Diagnosis not present

## 2013-07-10 DIAGNOSIS — I10 Essential (primary) hypertension: Secondary | ICD-10-CM | POA: Insufficient documentation

## 2013-07-10 DIAGNOSIS — Z8669 Personal history of other diseases of the nervous system and sense organs: Secondary | ICD-10-CM | POA: Diagnosis not present

## 2013-07-10 DIAGNOSIS — Z8701 Personal history of pneumonia (recurrent): Secondary | ICD-10-CM | POA: Insufficient documentation

## 2013-07-10 DIAGNOSIS — R0602 Shortness of breath: Secondary | ICD-10-CM | POA: Diagnosis not present

## 2013-07-10 DIAGNOSIS — J90 Pleural effusion, not elsewhere classified: Secondary | ICD-10-CM | POA: Diagnosis not present

## 2013-07-10 DIAGNOSIS — R062 Wheezing: Secondary | ICD-10-CM | POA: Insufficient documentation

## 2013-07-10 DIAGNOSIS — Z79899 Other long term (current) drug therapy: Secondary | ICD-10-CM | POA: Insufficient documentation

## 2013-07-10 DIAGNOSIS — J9819 Other pulmonary collapse: Secondary | ICD-10-CM | POA: Diagnosis not present

## 2013-07-10 LAB — CBC WITH DIFFERENTIAL/PLATELET
Basophils Absolute: 0 10*3/uL (ref 0.0–0.1)
Basophils Relative: 0 % (ref 0–1)
EOS ABS: 0.2 10*3/uL (ref 0.0–0.7)
EOS PCT: 2 % (ref 0–5)
HCT: 34.9 % — ABNORMAL LOW (ref 39.0–52.0)
HEMOGLOBIN: 11.8 g/dL — AB (ref 13.0–17.0)
LYMPHS PCT: 13 % (ref 12–46)
Lymphs Abs: 2.1 10*3/uL (ref 0.7–4.0)
MCH: 29.7 pg (ref 26.0–34.0)
MCHC: 33.8 g/dL (ref 30.0–36.0)
MCV: 87.9 fL (ref 78.0–100.0)
Monocytes Absolute: 1.4 10*3/uL — ABNORMAL HIGH (ref 0.1–1.0)
Monocytes Relative: 9 % (ref 3–12)
NEUTROS PCT: 76 % (ref 43–77)
Neutro Abs: 11.8 10*3/uL — ABNORMAL HIGH (ref 1.7–7.7)
PLATELETS: 304 10*3/uL (ref 150–400)
RBC: 3.97 MIL/uL — ABNORMAL LOW (ref 4.22–5.81)
RDW: 14.5 % (ref 11.5–15.5)
WBC: 15.5 10*3/uL — AB (ref 4.0–10.5)

## 2013-07-10 LAB — COMPREHENSIVE METABOLIC PANEL
ALK PHOS: 58 U/L (ref 39–117)
ALT: 21 U/L (ref 0–53)
AST: 27 U/L (ref 0–37)
Albumin: 3.4 g/dL — ABNORMAL LOW (ref 3.5–5.2)
BILIRUBIN TOTAL: 0.3 mg/dL (ref 0.3–1.2)
BUN: 35 mg/dL — AB (ref 6–23)
CO2: 26 meq/L (ref 19–32)
Calcium: 9.2 mg/dL (ref 8.4–10.5)
Chloride: 102 mEq/L (ref 96–112)
Creatinine, Ser: 2.95 mg/dL — ABNORMAL HIGH (ref 0.50–1.35)
GFR, EST AFRICAN AMERICAN: 22 mL/min — AB (ref >90)
GFR, EST NON AFRICAN AMERICAN: 19 mL/min — AB (ref >90)
Glucose, Bld: 114 mg/dL — ABNORMAL HIGH (ref 70–99)
POTASSIUM: 3.6 meq/L — AB (ref 3.7–5.3)
SODIUM: 142 meq/L (ref 137–147)
Total Protein: 7.3 g/dL (ref 6.0–8.3)

## 2013-07-10 LAB — TROPONIN I

## 2013-07-10 LAB — PRO B NATRIURETIC PEPTIDE: Pro B Natriuretic peptide (BNP): 996.8 pg/mL — ABNORMAL HIGH (ref 0–450)

## 2013-07-10 MED ORDER — ALBUTEROL SULFATE (2.5 MG/3ML) 0.083% IN NEBU
5.0000 mg | INHALATION_SOLUTION | Freq: Once | RESPIRATORY_TRACT | Status: AC
  Administered 2013-07-10: 5 mg via RESPIRATORY_TRACT
  Filled 2013-07-10: qty 6

## 2013-07-10 MED ORDER — IPRATROPIUM BROMIDE 0.02 % IN SOLN
0.5000 mg | Freq: Once | RESPIRATORY_TRACT | Status: AC
  Administered 2013-07-10: 0.5 mg via RESPIRATORY_TRACT
  Filled 2013-07-10: qty 2.5

## 2013-07-10 MED ORDER — ALBUTEROL SULFATE HFA 108 (90 BASE) MCG/ACT IN AERS
2.0000 | INHALATION_SPRAY | RESPIRATORY_TRACT | Status: DC | PRN
  Administered 2013-07-10: 2 via RESPIRATORY_TRACT
  Filled 2013-07-10: qty 6.7

## 2013-07-10 MED ORDER — LEVOFLOXACIN 500 MG PO TABS: 750.0000 mg | ORAL_TABLET | Freq: Every day | ORAL | Status: DC

## 2013-07-10 MED ORDER — METHYLPREDNISOLONE SODIUM SUCC 125 MG IJ SOLR
125.0000 mg | Freq: Once | INTRAMUSCULAR | Status: AC
  Administered 2013-07-10: 125 mg via INTRAVENOUS
  Filled 2013-07-10: qty 2

## 2013-07-10 NOTE — ED Provider Notes (Addendum)
CSN: [id number]     Arrival date & time 07/10/13  1309 History   First MD Initiated Contact with Patient 07/10/13 1334 This chart was scribed for Maudry Diego, MD by Anastasia Pall, ED Scribe. This patient was seen in room APA08/APA08 and the patient's care was started at 1:46 PM.      Chief Complaint  Patient presents with  . Chest Pain  . Generalized Body Aches    Patient is a 80 y.o. male presenting with chest pain. The history is provided by the patient. No language interpreter was used.  Chest Pain Pain location:  Substernal area, L chest and R chest Pain radiates to:  Does not radiate Duration:  1 week Timing:  Intermittent Progression:  Worsening Chronicity:  Recurrent (6 months) Associated symptoms: cough, nausea and shortness of breath   Associated symptoms: no abdominal pain, no back pain, no dizziness, no fatigue, no headache and not vomiting    HPI Comments: John [name] is a 80 y.o. male who presents to the Emergency Department complaining of worsening chest pain, across his chest, onset 1 week. He reports h/o intermittent chest pain for past 6 months, but states never this bad. He reports associated worsening SOB and cough, productive of minimal sputum for a few days. He reports h/o SOB. He also reports associated chills, body aches, decreased appetite, and nausea. He denies vomiting, and any other associated symptoms. He reports he takes his medications regularly, stating he takes whatHe denies having his flu immunization.    PCP - Purvis Kilts, MD  Past Medical History  Diagnosis Date  . GI bleed     2006, TCS/TI showed small polyp. EGD showed erosive antral gastritis with two polyp, one oozing and tx. HP neg. SB capsule shoed few jejunal ulcers with bleeding (naproxen and ASA)  . GI bleed     02/2010, EGD->pancreatitc rest, fundal gland polyps,  no bleeding. TCS->blood-tinged colonic effluent throughout the colon without bleeding lesion found, nl TI. SB  capsule->SB erosions, nonbleeding, incomplete study. Patient on naproxyn.   . DM (diabetes mellitus)   . Pneumonia   . Hyperlipidemia   . HTN (hypertension)   . Stroke   . GERD (gastroesophageal reflux disease)   . Poor vision     left eye  . Nephrolithiasis   . Sleep apnea   . Obesity   . Ischemic colitis, enteritis, or enterocolitis     flex sig 01/2010  . Chronic renal insufficiency   . Gastric AVM 10/24/10    GI bleed EGD w/ bicap and hemoclip placement, 2 AVMs, presented w/bright red rectal bleeding  . GI bleed 10/27/10    ileocolonoscopy/GIVENS capsule study by Dr Rourk->bloody colonic effluent throughout colon but no lesion identified, TA @ hepatic flexure, fresh blood in dital SB on capsule. Nuc med RBC study negative  . Tubular adenoma of colon 10/27/10    on colonoscopy Dr Gala Romney   Past Surgical History  Procedure Laterality Date  . Back surgery    . Transurethral resection of prostate    . Cataract extraction      bilateral  . Appendectomy    . Cholecystectomy    . Elbow surgery    . Esophagogastroduodenoscopy  10/23/10    Dr. Tora Kindred arteriovenous malformations,GI bleed- capsule  . Colonoscopy  10/27/10    Dr. Chauncy Lean adenoma, normal rectum bloody colonic effluent throughout the colon with out bleeding lesion seen.    Family History  Problem Relation Age of Onset  .  Aneurysm Daughter     brain, deceased age 76  . GI problems Brother     gi bleed  . Cancer Sister     unknown type  . Colon cancer Neg Hx   . Liver disease Neg Hx    History  Substance Use Topics  . Smoking status: Never Smoker   . Smokeless tobacco: Never Used  . Alcohol Use: No    Review of Systems  Constitutional: Positive for chills and appetite change (decreased). Negative for fatigue.  HENT: Negative for congestion, ear discharge and sinus pressure.   Eyes: Negative for discharge.  Respiratory: Positive for cough and shortness of breath.   Cardiovascular: Positive for  chest pain.  Gastrointestinal: Positive for nausea. Negative for vomiting, abdominal pain and diarrhea.  Genitourinary: Negative for frequency and hematuria.  Musculoskeletal: Positive for myalgias (generalized body aches). Negative for back pain.  Skin: Negative for rash.  Neurological: Negative for dizziness, seizures and headaches.  Psychiatric/Behavioral: Negative for hallucinations.    Allergies  Betadine; Latex; Povidone; Propoxyphene hcl; and Sulfonamide derivatives  Home Medications   Current Outpatient Rx  Name  Route  Sig  Dispense  Refill  . amLODipine (NORVASC) 10 MG tablet   Oral   Take 5 mg by mouth daily.          . Cholecalciferol (VITAMIN D) 2000 UNITS CAPS   Oral   Take 1 capsule by mouth daily.         . Choline Fenofibrate (TRILIPIX) 135 MG capsule   Oral   Take 135 mg by mouth at bedtime.          . ferrous sulfate 325 (65 FE) MG tablet   Oral   Take 325 mg by mouth daily with breakfast.         . furosemide (LASIX) 40 MG tablet   Oral   Take 20-40 mg by mouth 2 (two) times daily. Take 40 mg in the morning and 20 mg at night.         . insulin lispro protamine-lispro (HUMALOG 75/25 MIX) (75-25) 100 UNIT/ML SUSP injection   Subcutaneous   Inject 55 Units into the skin 2 (two) times daily with a meal.         . KLOR-CON M10 10 MEQ tablet   Oral   Take 1 tablet by mouth daily.         Marland Kitchen losartan (COZAAR) 50 MG tablet   Oral   Take 1 tablet (50 mg total) by mouth daily.   90 tablet   3   . nebivolol (BYSTOLIC) 10 MG tablet   Oral   Take 10 mg by mouth every morning.           . pantoprazole (PROTONIX) 40 MG tablet   Oral   Take 40 mg by mouth daily.          Marland Kitchen terazosin (HYTRIN) 5 MG capsule   Oral   Take 1 capsule by mouth daily.          BP 132/78  Pulse 77  Temp(Src) 97.5 F (36.4 C) (Oral)  Resp 24  Ht 5\' 7"  (1.702 m)  Wt 247 lb (112.038 kg)  BMI 38.68 kg/m2  SpO2 94%  Physical Exam  Constitutional: He  is oriented to person, place, and time. He appears well-developed.  HENT:  Head: Normocephalic.  Eyes: Conjunctivae and EOM are normal. No scleral icterus.  Neck: Neck supple. No thyromegaly present.  Cardiovascular: Normal rate and  regular rhythm.  Exam reveals no gallop and no friction rub.   No murmur heard. Pulmonary/Chest: Effort normal. No stridor. He has wheezes (moderate wheezing bilaterally). He has no rales. He exhibits no tenderness.  Abdominal: He exhibits no distension. There is no tenderness. There is no rebound.  Musculoskeletal: Normal range of motion. He exhibits no edema.  Lymphadenopathy:    He has no cervical adenopathy.  Neurological: He is oriented to person, place, and time. He exhibits normal muscle tone. Coordination normal.  Skin: No rash noted. No erythema.  Psychiatric: He has a normal mood and affect. His behavior is normal.    ED Course  Procedures (including critical care time)  DIAGNOSTIC STUDIES: Oxygen Saturation is 94% on room air, normal by my interpretation.    COORDINATION OF CARE: 1:50 PM-Discussed treatment plan which includes DG chest and breathing treatment with pt at bedside and pt agreed to plan.   Results for orders placed during the hospital encounter of 07/10/13  CBC WITH DIFFERENTIAL      Result Value Range   WBC 15.5 (*) 4.0 - 10.5 K/uL   RBC 3.97 (*) 4.22 - 5.81 MIL/uL   Hemoglobin 11.8 (*) 13.0 - 17.0 g/dL   HCT 34.9 (*) 39.0 - 52.0 %   MCV 87.9  78.0 - 100.0 fL   MCH 29.7  26.0 - 34.0 pg   MCHC 33.8  30.0 - 36.0 g/dL   RDW 14.5  11.5 - 15.5 %   Platelets 304  150 - 400 K/uL   Neutrophils Relative % 76  43 - 77 %   Neutro Abs 11.8 (*) 1.7 - 7.7 K/uL   Lymphocytes Relative 13  12 - 46 %   Lymphs Abs 2.1  0.7 - 4.0 K/uL   Monocytes Relative 9  3 - 12 %   Monocytes Absolute 1.4 (*) 0.1 - 1.0 K/uL   Eosinophils Relative 2  0 - 5 %   Eosinophils Absolute 0.2  0.0 - 0.7 K/uL   Basophils Relative 0  0 - 1 %   Basophils  Absolute 0.0  0.0 - 0.1 K/uL  COMPREHENSIVE METABOLIC PANEL      Result Value Range   Sodium 142  137 - 147 mEq/L   Potassium 3.6 (*) 3.7 - 5.3 mEq/L   Chloride 102  96 - 112 mEq/L   CO2 26  19 - 32 mEq/L   Glucose, Bld 114 (*) 70 - 99 mg/dL   BUN 35 (*) 6 - 23 mg/dL   Creatinine, Ser 2.95 (*) 0.50 - 1.35 mg/dL   Calcium 9.2  8.4 - 10.5 mg/dL   Total Protein 7.3  6.0 - 8.3 g/dL   Albumin 3.4 (*) 3.5 - 5.2 g/dL   AST 27  0 - 37 U/L   ALT 21  0 - 53 U/L   Alkaline Phosphatase 58  39 - 117 U/L   Total Bilirubin 0.3  0.3 - 1.2 mg/dL   GFR calc non Af Amer 19 (*) >90 mL/min   GFR calc Af Amer 22 (*) >90 mL/min  TROPONIN I      Result Value Range   Troponin I <0.30  <0.30 ng/mL  PRO B NATRIURETIC PEPTIDE      Result Value Range   Pro B Natriuretic peptide (BNP) 996.8 (*) 0 - 450 pg/mL   Dg Chest 2 View  07/10/2013   CLINICAL DATA:  Chest pain  EXAM: CHEST  2 VIEW  COMPARISON:  10/22/2012  FINDINGS:  Upper normal heart size. Small left pleural effusion and basilar atelectasis. No pneumothorax. No pulmonary edema.  IMPRESSION: Small left pleural effusion and basilar atelectasis.   Electronically Signed   By: Maryclare Bean M.D.   On: 07/10/2013 14:52    EKG Interpretation    Date/Time:  Monday July 10 2013 13:27:12 EST Ventricular Rate:  72 PR Interval:  142 QRS Duration: 156 QT Interval:  426 QTC Calculation: 466 R Axis:   117 Text Interpretation:  Normal sinus rhythm Non-specific intra-ventricular conduction block Lateral infarct , age undetermined Inferior infarct , age undetermined Abnormal ECG When compared with ECG of 20-Oct-2012 02:27, Non-specific intra-ventricular conduction block has replaced Right bundle branch block Lateral infarct is now Present Inferior infarct is now Present Confirmed by Fredia Chittenden  MD, Bailen Geffre ([id number]) on 07/10/2013 3:40:19 PM           Medications  albuterol (PROVENTIL) (2.5 MG/3ML) 0.083% nebulizer solution 5 mg (5 mg Nebulization Given 07/10/13 1403)   ipratropium (ATROVENT) nebulizer solution 0.5 mg (0.5 mg Nebulization Given 07/10/13 1403)  methylPREDNISolone sodium succinate (SOLU-MEDROL) 125 mg/2 mL injection 125 mg (125 mg Intravenous Given 07/10/13 1409)   Pt improved with neb tx.  MDM  Al Pimple,   Will give albuterol and antibiotics and increase lasix The chart was scribed for me under my direct supervision.  I personally performed the history, physical, and medical decision making and all procedures in the evaluation of this patient.Maudry Diego, MD 07/10/13 [place], MD 07/10/13 [phone]

## 2013-07-10 NOTE — Discharge Instructions (Signed)
Increase your lasix fluid pills from 2, 20mg  lasix in the am and 1, 20mg  pill in the pm       To 2, 20mg  lasix in the am and pm.       Follow up with your md in 2-3 days

## 2013-07-10 NOTE — ED Notes (Signed)
Pt states chills, body aches, no appetite, nausea. Denies cough. States always sob but sob is a little worse than usual. States  He has seen his PMD for CP for past 6 months but it has been on the right. States CP is all the way across x 1week.

## 2013-07-18 DIAGNOSIS — J019 Acute sinusitis, unspecified: Secondary | ICD-10-CM | POA: Diagnosis not present

## 2013-07-18 DIAGNOSIS — IMO0002 Reserved for concepts with insufficient information to code with codable children: Secondary | ICD-10-CM | POA: Diagnosis not present

## 2013-07-18 DIAGNOSIS — J069 Acute upper respiratory infection, unspecified: Secondary | ICD-10-CM | POA: Diagnosis not present

## 2013-08-03 DIAGNOSIS — E1159 Type 2 diabetes mellitus with other circulatory complications: Secondary | ICD-10-CM | POA: Diagnosis not present

## 2013-08-03 DIAGNOSIS — E119 Type 2 diabetes mellitus without complications: Secondary | ICD-10-CM | POA: Diagnosis not present

## 2013-08-07 DIAGNOSIS — E559 Vitamin D deficiency, unspecified: Secondary | ICD-10-CM | POA: Diagnosis not present

## 2013-08-07 DIAGNOSIS — N183 Chronic kidney disease, stage 3 unspecified: Secondary | ICD-10-CM | POA: Diagnosis not present

## 2013-08-07 DIAGNOSIS — Z79899 Other long term (current) drug therapy: Secondary | ICD-10-CM | POA: Diagnosis not present

## 2013-08-07 DIAGNOSIS — D649 Anemia, unspecified: Secondary | ICD-10-CM | POA: Diagnosis not present

## 2013-08-09 DIAGNOSIS — D649 Anemia, unspecified: Secondary | ICD-10-CM | POA: Diagnosis not present

## 2013-08-09 DIAGNOSIS — I10 Essential (primary) hypertension: Secondary | ICD-10-CM | POA: Diagnosis not present

## 2013-08-09 DIAGNOSIS — R809 Proteinuria, unspecified: Secondary | ICD-10-CM | POA: Diagnosis not present

## 2013-08-09 DIAGNOSIS — N184 Chronic kidney disease, stage 4 (severe): Secondary | ICD-10-CM | POA: Diagnosis not present

## 2013-08-31 ENCOUNTER — Other Ambulatory Visit: Payer: Self-pay | Admitting: *Deleted

## 2013-08-31 MED ORDER — POTASSIUM CHLORIDE CRYS ER 10 MEQ PO TBCR: 10.0000 meq | EXTENDED_RELEASE_TABLET | Freq: Every day | ORAL | Status: DC

## 2013-08-31 MED ORDER — FUROSEMIDE 40 MG PO TABS: ORAL_TABLET | ORAL | Status: DC

## 2013-08-31 NOTE — Telephone Encounter (Signed)
This was a patient of Dr. Rollene Fare. Appointment due in July 2015. Note sent to schedulers to place patient on a recall for any MD.

## 2013-09-01 ENCOUNTER — Encounter: Payer: Self-pay | Admitting: *Deleted

## 2013-09-28 ENCOUNTER — Other Ambulatory Visit: Payer: Self-pay

## 2013-09-28 NOTE — Telephone Encounter (Signed)
Rx was sent to pharmacy electronically. 

## 2013-09-29 ENCOUNTER — Other Ambulatory Visit: Payer: Self-pay

## 2013-09-29 MED ORDER — FUROSEMIDE 40 MG PO TABS: ORAL_TABLET | ORAL | Status: DC

## 2013-09-29 NOTE — Telephone Encounter (Signed)
Rx was sent to pharmacy electronically. 

## 2013-10-24 DIAGNOSIS — IMO0002 Reserved for concepts with insufficient information to code with codable children: Secondary | ICD-10-CM | POA: Diagnosis not present

## 2013-10-24 DIAGNOSIS — K219 Gastro-esophageal reflux disease without esophagitis: Secondary | ICD-10-CM | POA: Diagnosis not present

## 2013-10-24 DIAGNOSIS — Z6838 Body mass index (BMI) 38.0-38.9, adult: Secondary | ICD-10-CM | POA: Diagnosis not present

## 2013-10-24 DIAGNOSIS — M1710 Unilateral primary osteoarthritis, unspecified knee: Secondary | ICD-10-CM | POA: Diagnosis not present

## 2013-10-26 DIAGNOSIS — E119 Type 2 diabetes mellitus without complications: Secondary | ICD-10-CM | POA: Diagnosis not present

## 2013-10-26 DIAGNOSIS — E1159 Type 2 diabetes mellitus with other circulatory complications: Secondary | ICD-10-CM | POA: Diagnosis not present

## 2013-11-10 DIAGNOSIS — Z6837 Body mass index (BMI) 37.0-37.9, adult: Secondary | ICD-10-CM | POA: Diagnosis not present

## 2013-11-10 DIAGNOSIS — T50995A Adverse effect of other drugs, medicaments and biological substances, initial encounter: Secondary | ICD-10-CM | POA: Diagnosis not present

## 2013-11-10 DIAGNOSIS — D485 Neoplasm of uncertain behavior of skin: Secondary | ICD-10-CM | POA: Diagnosis not present

## 2013-11-10 DIAGNOSIS — R252 Cramp and spasm: Secondary | ICD-10-CM | POA: Diagnosis not present

## 2013-11-27 DIAGNOSIS — E559 Vitamin D deficiency, unspecified: Secondary | ICD-10-CM | POA: Diagnosis not present

## 2013-11-27 DIAGNOSIS — Z79899 Other long term (current) drug therapy: Secondary | ICD-10-CM | POA: Diagnosis not present

## 2013-11-27 DIAGNOSIS — N189 Chronic kidney disease, unspecified: Secondary | ICD-10-CM | POA: Diagnosis not present

## 2013-11-27 DIAGNOSIS — D649 Anemia, unspecified: Secondary | ICD-10-CM | POA: Diagnosis not present

## 2013-11-29 DIAGNOSIS — E559 Vitamin D deficiency, unspecified: Secondary | ICD-10-CM | POA: Diagnosis not present

## 2013-11-29 DIAGNOSIS — R809 Proteinuria, unspecified: Secondary | ICD-10-CM | POA: Diagnosis not present

## 2013-11-29 DIAGNOSIS — I10 Essential (primary) hypertension: Secondary | ICD-10-CM | POA: Diagnosis not present

## 2013-11-29 DIAGNOSIS — N184 Chronic kidney disease, stage 4 (severe): Secondary | ICD-10-CM | POA: Diagnosis not present

## 2013-12-11 DIAGNOSIS — IMO0001 Reserved for inherently not codable concepts without codable children: Secondary | ICD-10-CM | POA: Diagnosis not present

## 2013-12-19 ENCOUNTER — Other Ambulatory Visit: Payer: Self-pay

## 2013-12-19 MED ORDER — TERAZOSIN HCL 5 MG PO CAPS: 5.0000 mg | ORAL_CAPSULE | Freq: Every day | ORAL | Status: DC

## 2013-12-19 MED ORDER — POTASSIUM CHLORIDE CRYS ER 10 MEQ PO TBCR: 10.0000 meq | EXTENDED_RELEASE_TABLET | Freq: Every day | ORAL | Status: DC

## 2013-12-19 NOTE — Telephone Encounter (Signed)
Rx was sent to pharmacy electronically. Patient's last office visit - 01/09/2013 with  Dr Marella Chimes

## 2014-01-09 DIAGNOSIS — M1710 Unilateral primary osteoarthritis, unspecified knee: Secondary | ICD-10-CM | POA: Diagnosis not present

## 2014-01-09 DIAGNOSIS — IMO0002 Reserved for concepts with insufficient information to code with codable children: Secondary | ICD-10-CM | POA: Diagnosis not present

## 2014-01-18 DIAGNOSIS — E119 Type 2 diabetes mellitus without complications: Secondary | ICD-10-CM | POA: Diagnosis not present

## 2014-01-18 DIAGNOSIS — E1159 Type 2 diabetes mellitus with other circulatory complications: Secondary | ICD-10-CM | POA: Diagnosis not present

## 2014-01-23 ENCOUNTER — Other Ambulatory Visit (HOSPITAL_COMMUNITY): Payer: Self-pay | Admitting: Internal Medicine

## 2014-01-23 ENCOUNTER — Ambulatory Visit (HOSPITAL_COMMUNITY)
Admission: RE | Admit: 2014-01-23 | Discharge: 2014-01-23 | Disposition: A | Discharge status: Home / Self Care | Payer: Medicare Other | Source: Ambulatory Visit | Attending: Internal Medicine | Admitting: Internal Medicine

## 2014-01-23 DIAGNOSIS — K3189 Other diseases of stomach and duodenum: Secondary | ICD-10-CM | POA: Diagnosis not present

## 2014-01-23 DIAGNOSIS — Z6836 Body mass index (BMI) 36.0-36.9, adult: Secondary | ICD-10-CM | POA: Diagnosis not present

## 2014-01-23 DIAGNOSIS — I2510 Atherosclerotic heart disease of native coronary artery without angina pectoris: Secondary | ICD-10-CM | POA: Diagnosis not present

## 2014-01-23 DIAGNOSIS — R1032 Left lower quadrant pain: Secondary | ICD-10-CM | POA: Diagnosis not present

## 2014-01-23 DIAGNOSIS — Z87442 Personal history of urinary calculi: Secondary | ICD-10-CM | POA: Diagnosis not present

## 2014-01-23 DIAGNOSIS — R109 Unspecified abdominal pain: Secondary | ICD-10-CM

## 2014-01-23 DIAGNOSIS — N200 Calculus of kidney: Secondary | ICD-10-CM | POA: Diagnosis not present

## 2014-01-23 DIAGNOSIS — E119 Type 2 diabetes mellitus without complications: Secondary | ICD-10-CM | POA: Diagnosis not present

## 2014-01-23 DIAGNOSIS — R1013 Epigastric pain: Secondary | ICD-10-CM | POA: Diagnosis not present

## 2014-01-23 DIAGNOSIS — I10 Essential (primary) hypertension: Secondary | ICD-10-CM | POA: Diagnosis not present

## 2014-01-24 ENCOUNTER — Other Ambulatory Visit (HOSPITAL_COMMUNITY): Payer: Self-pay | Admitting: Internal Medicine

## 2014-01-24 DIAGNOSIS — M549 Dorsalgia, unspecified: Secondary | ICD-10-CM

## 2014-01-26 ENCOUNTER — Ambulatory Visit (HOSPITAL_COMMUNITY): Payer: Medicare Other

## 2014-01-27 ENCOUNTER — Encounter (HOSPITAL_COMMUNITY): Payer: Self-pay | Admitting: *Deleted

## 2014-01-27 ENCOUNTER — Emergency Department (HOSPITAL_COMMUNITY): Payer: Medicare Other

## 2014-01-27 ENCOUNTER — Observation Stay (HOSPITAL_COMMUNITY)
Admission: EM | Admit: 2014-01-27 | Discharge: 2014-01-29 | Disposition: A | Discharge status: Home / Self Care | Payer: Medicare Other | Attending: Internal Medicine | Admitting: Internal Medicine

## 2014-01-27 DIAGNOSIS — N183 Chronic kidney disease, stage 3 unspecified: Secondary | ICD-10-CM

## 2014-01-27 DIAGNOSIS — R0602 Shortness of breath: Secondary | ICD-10-CM | POA: Diagnosis not present

## 2014-01-27 DIAGNOSIS — Z8673 Personal history of transient ischemic attack (TIA), and cerebral infarction without residual deficits: Secondary | ICD-10-CM | POA: Insufficient documentation

## 2014-01-27 DIAGNOSIS — Z91040 Latex allergy status: Secondary | ICD-10-CM | POA: Insufficient documentation

## 2014-01-27 DIAGNOSIS — H547 Unspecified visual loss: Secondary | ICD-10-CM | POA: Diagnosis not present

## 2014-01-27 DIAGNOSIS — R110 Nausea: Secondary | ICD-10-CM | POA: Diagnosis not present

## 2014-01-27 DIAGNOSIS — Z860100 Personal history of colon polyps, unspecified: Secondary | ICD-10-CM | POA: Insufficient documentation

## 2014-01-27 DIAGNOSIS — E785 Hyperlipidemia, unspecified: Secondary | ICD-10-CM | POA: Diagnosis not present

## 2014-01-27 DIAGNOSIS — N189 Chronic kidney disease, unspecified: Secondary | ICD-10-CM | POA: Insufficient documentation

## 2014-01-27 DIAGNOSIS — G4730 Sleep apnea, unspecified: Secondary | ICD-10-CM | POA: Diagnosis not present

## 2014-01-27 DIAGNOSIS — E669 Obesity, unspecified: Secondary | ICD-10-CM

## 2014-01-27 DIAGNOSIS — K922 Gastrointestinal hemorrhage, unspecified: Secondary | ICD-10-CM | POA: Diagnosis not present

## 2014-01-27 DIAGNOSIS — Z8701 Personal history of pneumonia (recurrent): Secondary | ICD-10-CM | POA: Insufficient documentation

## 2014-01-27 DIAGNOSIS — N400 Enlarged prostate without lower urinary tract symptoms: Secondary | ICD-10-CM

## 2014-01-27 DIAGNOSIS — Z79899 Other long term (current) drug therapy: Secondary | ICD-10-CM | POA: Insufficient documentation

## 2014-01-27 DIAGNOSIS — Z87442 Personal history of urinary calculi: Secondary | ICD-10-CM | POA: Diagnosis not present

## 2014-01-27 DIAGNOSIS — Z794 Long term (current) use of insulin: Secondary | ICD-10-CM | POA: Insufficient documentation

## 2014-01-27 DIAGNOSIS — E119 Type 2 diabetes mellitus without complications: Secondary | ICD-10-CM | POA: Diagnosis not present

## 2014-01-27 DIAGNOSIS — K2100 Gastro-esophageal reflux disease with esophagitis, without bleeding: Secondary | ICD-10-CM

## 2014-01-27 DIAGNOSIS — K559 Vascular disorder of intestine, unspecified: Secondary | ICD-10-CM | POA: Diagnosis not present

## 2014-01-27 DIAGNOSIS — R079 Chest pain, unspecified: Secondary | ICD-10-CM | POA: Diagnosis not present

## 2014-01-27 DIAGNOSIS — E1122 Type 2 diabetes mellitus with diabetic chronic kidney disease: Secondary | ICD-10-CM

## 2014-01-27 DIAGNOSIS — K219 Gastro-esophageal reflux disease without esophagitis: Secondary | ICD-10-CM | POA: Diagnosis not present

## 2014-01-27 DIAGNOSIS — I129 Hypertensive chronic kidney disease with stage 1 through stage 4 chronic kidney disease, or unspecified chronic kidney disease: Secondary | ICD-10-CM | POA: Diagnosis not present

## 2014-01-27 DIAGNOSIS — K5900 Constipation, unspecified: Secondary | ICD-10-CM

## 2014-01-27 DIAGNOSIS — I10 Essential (primary) hypertension: Secondary | ICD-10-CM | POA: Diagnosis not present

## 2014-01-27 DIAGNOSIS — R5381 Other malaise: Secondary | ICD-10-CM

## 2014-01-27 DIAGNOSIS — E1129 Type 2 diabetes mellitus with other diabetic kidney complication: Secondary | ICD-10-CM | POA: Diagnosis not present

## 2014-01-27 DIAGNOSIS — R531 Weakness: Secondary | ICD-10-CM

## 2014-01-27 DIAGNOSIS — E1121 Type 2 diabetes mellitus with diabetic nephropathy: Secondary | ICD-10-CM

## 2014-01-27 LAB — CBC WITH DIFFERENTIAL/PLATELET
BASOS ABS: 0 10*3/uL (ref 0.0–0.1)
BASOS PCT: 0 % (ref 0–1)
EOS ABS: 0.2 10*3/uL (ref 0.0–0.7)
EOS PCT: 1 % (ref 0–5)
HCT: 36 % — ABNORMAL LOW (ref 39.0–52.0)
Hemoglobin: 11.6 g/dL — ABNORMAL LOW (ref 13.0–17.0)
LYMPHS ABS: 1.4 10*3/uL (ref 0.7–4.0)
Lymphocytes Relative: 10 % — ABNORMAL LOW (ref 12–46)
MCH: 28.8 pg (ref 26.0–34.0)
MCHC: 32.2 g/dL (ref 30.0–36.0)
MCV: 89.3 fL (ref 78.0–100.0)
Monocytes Absolute: 1.4 10*3/uL — ABNORMAL HIGH (ref 0.1–1.0)
Monocytes Relative: 10 % (ref 3–12)
NEUTROS PCT: 79 % — AB (ref 43–77)
Neutro Abs: 11.7 10*3/uL — ABNORMAL HIGH (ref 1.7–7.7)
Platelets: 330 10*3/uL (ref 150–400)
RBC: 4.03 MIL/uL — ABNORMAL LOW (ref 4.22–5.81)
RDW: 14.6 % (ref 11.5–15.5)
WBC: 14.7 10*3/uL — ABNORMAL HIGH (ref 4.0–10.5)

## 2014-01-27 LAB — TROPONIN I

## 2014-01-27 LAB — URINALYSIS, ROUTINE W REFLEX MICROSCOPIC
BILIRUBIN URINE: NEGATIVE
Glucose, UA: NEGATIVE mg/dL
KETONES UR: NEGATIVE mg/dL
Leukocytes, UA: NEGATIVE
Nitrite: NEGATIVE
Protein, ur: 100 mg/dL — AB
SPECIFIC GRAVITY, URINE: 1.015 (ref 1.005–1.030)
UROBILINOGEN UA: 0.2 mg/dL (ref 0.0–1.0)
pH: 5.5 (ref 5.0–8.0)

## 2014-01-27 LAB — BASIC METABOLIC PANEL
Anion gap: 15 (ref 5–15)
BUN: 58 mg/dL — ABNORMAL HIGH (ref 6–23)
CALCIUM: 9 mg/dL (ref 8.4–10.5)
CO2: 23 mEq/L (ref 19–32)
Chloride: 101 mEq/L (ref 96–112)
Creatinine, Ser: 3.33 mg/dL — ABNORMAL HIGH (ref 0.50–1.35)
GFR, EST AFRICAN AMERICAN: 19 mL/min — AB (ref >90)
GFR, EST NON AFRICAN AMERICAN: 16 mL/min — AB (ref >90)
GLUCOSE: 153 mg/dL — AB (ref 70–99)
Potassium: 4.1 mEq/L (ref 3.7–5.3)
SODIUM: 139 meq/L (ref 137–147)

## 2014-01-27 LAB — PRO B NATRIURETIC PEPTIDE: Pro B Natriuretic peptide (BNP): 2210 pg/mL — ABNORMAL HIGH (ref 0–450)

## 2014-01-27 LAB — URINE MICROSCOPIC-ADD ON

## 2014-01-27 MED ORDER — SODIUM CHLORIDE 0.45 % IV SOLN
INTRAVENOUS | Status: DC
  Administered 2014-01-28 (×2): via INTRAVENOUS

## 2014-01-27 MED ORDER — FUROSEMIDE 10 MG/ML IJ SOLN
40.0000 mg | Freq: Once | INTRAMUSCULAR | Status: AC
Start: 2014-01-27 — End: 2014-01-28
  Administered 2014-01-28: 40 mg via INTRAVENOUS
  Filled 2014-01-27: qty 4

## 2014-01-27 MED ORDER — INSULIN ASPART 100 UNIT/ML ~~LOC~~ SOLN
0.0000 [IU] | Freq: Three times a day (TID) | SUBCUTANEOUS | Status: DC
  Administered 2014-01-28: 2 [IU] via SUBCUTANEOUS
  Administered 2014-01-28: 3 [IU] via SUBCUTANEOUS
  Administered 2014-01-29: 5 [IU] via SUBCUTANEOUS
  Administered 2014-01-29: 2 [IU] via SUBCUTANEOUS

## 2014-01-27 MED ORDER — ONDANSETRON HCL 4 MG/2ML IJ SOLN: 4.0000 mg | Freq: Four times a day (QID) | INTRAMUSCULAR | Status: DC | PRN

## 2014-01-27 MED ORDER — TERAZOSIN HCL 5 MG PO CAPS
10.0000 mg | ORAL_CAPSULE | Freq: Two times a day (BID) | ORAL | Status: DC
  Administered 2014-01-28 – 2014-01-29 (×2): 10 mg via ORAL
  Filled 2014-01-27 (×2): qty 2

## 2014-01-27 MED ORDER — POLYETHYLENE GLYCOL 3350 17 G PO PACK: 17.0000 g | PACK | Freq: Every day | ORAL | Status: DC | PRN

## 2014-01-27 MED ORDER — HEPARIN SODIUM (PORCINE) 5000 UNIT/ML IJ SOLN
5000.0000 [IU] | Freq: Three times a day (TID) | INTRAMUSCULAR | Status: DC
  Administered 2014-01-28: 5000 [IU] via SUBCUTANEOUS
  Filled 2014-01-27 (×4): qty 1

## 2014-01-27 MED ORDER — HEPARIN SODIUM (PORCINE) 5000 UNIT/ML IJ SOLN
5000.0000 [IU] | Freq: Three times a day (TID) | INTRAMUSCULAR | Status: DC
  Administered 2014-01-28 – 2014-01-29 (×4): 5000 [IU] via SUBCUTANEOUS
  Filled 2014-01-27 (×2): qty 1

## 2014-01-27 MED ORDER — GI COCKTAIL ~~LOC~~
30.0000 mL | Freq: Four times a day (QID) | ORAL | Status: DC | PRN
  Administered 2014-01-28: 30 mL via ORAL
  Filled 2014-01-27: qty 30

## 2014-01-27 MED ORDER — PANTOPRAZOLE SODIUM 40 MG PO TBEC
40.0000 mg | DELAYED_RELEASE_TABLET | Freq: Every day | ORAL | Status: DC
  Administered 2014-01-29: 40 mg via ORAL
  Filled 2014-01-27: qty 1

## 2014-01-27 MED ORDER — SENNA 8.6 MG PO TABS
1.0000 | ORAL_TABLET | Freq: Two times a day (BID) | ORAL | Status: DC
  Administered 2014-01-28 – 2014-01-29 (×2): 8.6 mg via ORAL
  Filled 2014-01-27 (×6): qty 1

## 2014-01-27 MED ORDER — ACETAMINOPHEN 325 MG PO TABS
650.0000 mg | ORAL_TABLET | ORAL | Status: DC | PRN
  Filled 2014-01-27: qty 2

## 2014-01-27 MED ORDER — HYDROCODONE-ACETAMINOPHEN 5-325 MG PO TABS: 1.0000 | ORAL_TABLET | ORAL | Status: DC | PRN

## 2014-01-27 NOTE — Plan of Care (Signed)
Problem: Consults Goal: Chest Pain Patient Education (See Patient Education module for education specifics.) Outcome: Progressing Patient being monitored for vitals, serial labwork and patient symptoms Goal: Skin Care Protocol Initiated - if Braden Score 18 or less If consults are not indicated, leave blank or document N/A Outcome: Progressing Multiple moles over back and chest and face Goal: Nutrition Consult-if indicated Outcome: Progressing Patient is obese Goal: Diabetes Guidelines if Diabetic/Glucose > 140 If diabetic or lab glucose is > 140 mg/dl - Initiate Diabetes/Hyperglycemia Guidelines & Document Interventions  Outcome: Progressing Patient follows regimen for diabetes  Problem: Phase I Progression Outcomes Goal: Hemodynamically stable Outcome: Progressing Hypertension on admission Goal: Anginal pain relieved Outcome: Progressing No complaints of pain on admission

## 2014-01-27 NOTE — ED Notes (Signed)
In and out cath assisted by RN Peyton Najjar

## 2014-01-27 NOTE — H&P (Signed)
[facility] Admission History and Physical   Patient name: John [name] Medical record number: [id number] Date of birth: [birth year] Age: 80 y.o. Gender: male  Primary Care Provider: Purvis Kilts, MD Consultants: none Code Status: Partial  Chief Complaint: CP  Assessment and Plan: John [name] is a 80 y.o. male presenting with CP. PMH is significant for GI bleed GI AVM, DM, HLd, HTN, Stroke,   Unstable angina: multiple risk factor for ACS. Troponin neg, EKG w/ RBBB but no sign of iischemia. Reflux vs cardiac in nature. Pt out of PPI for several days and endorses worsening of CP during this time, especially after meals. Pain at rest and w/ exertion concerning. CXR w/ PNA. BNP 2210 likely elevated secondary to renal failure as opposed to acute CHF exacerbation (last Echo 2014 w/ EF 55-60% and Grade I diastolic dysfunction).  - Admit for obs - Tele - Cycle troponins (1/3 neg) - restart protonix - GI cocktail PRN - am EKG  SOB: progressive over the past several weeks and typically associated w/ CP. Pulmonary edema on CXR per my read w/o mention of PNA. No O2 requirement in ED - IV lasix 40 x1 - consider repeat CXR in the am if not improving -BMET in am  DM: on insulin at home - diabetic diet - SSI  HTN: hypotensive on admission - hold home BP medications.   AoCKD: CR 3.3 on admission. baseline around 2.5. Likely trending up secondary to progressive disease and possible dehydration.  - IVF 1/2NS 180ml/hr - BMET in the am  BPH: h/o frequent urination and wetting multiple pairs of pants per day. UA w/o evidence of infection - increase terazosin to BID.   Constipation: no BM x 1 wk. Intermittently constipated - miralax and senokot  Leukocytosis: WBC to 14.7. Afebrile. UA nml. Stress reaction vs infection. CXR concerning for pulm edema, may have underlying PNA that is masked by poor film.  - lasix and repeat film as above - CBC in am  FEN/GI:  - heart  healthy carb mod diet.  - PPI as above  Prophylaxis: Hep [place]win TID  Disposition: pending clinical improvement and CP r/o  History of Present Illness: John [name] is a 80 y.o. male presenting with CP, and SOB. Off an on throughought the day. Worse w/ eating, exertion, and rest. Ongoing for several months but significantly more over the past 2 weeks. Now w/ 2-3 episodes per day. Improved initially w/ protonix but ran out about 2-3 days ago.Denies worsening w/ lying down at night. CP inmproves w/ rest. Denies LE swelling.   Review Of Systems: Per HPI with the following additions: none Otherwise 12 point review of systems was performed and was unremarkable.  Patient Active Problem List   Diagnosis Date Noted  . Chest pain 01/27/2014  . Esophageal dysphagia 05/02/2013  . CAP (community acquired pneumonia) 10/20/2012  . Acute respiratory failure with hypoxia 10/20/2012  . DM type 2 (diabetes mellitus, type 2) 10/19/2012  . Dyspnea 10/19/2012  . Acute on chronic diastolic CHF (congestive heart failure) 10/19/2012  . Upper abdominal pain 04/06/2012  . Left flank pain 04/06/2012  . Groin pain 04/06/2012  . SOB (shortness of breath) 04/06/2012  . Physical deconditioning 02/18/2011  . Pyelonephritis 02/13/2011  . Weakness generalized 02/13/2011  . Fall 02/13/2011  . CKD (chronic kidney disease), stage III 02/13/2011  . BPH (benign prostatic hypertrophy) 02/13/2011  . Abdominal pain, acute, left lower quadrant 10/17/2010  . Constipation 10/17/2010  .  Melena 10/17/2010  . ANEMIA 04/25/2010  . COLONIC POLYPS, HX OF 03/04/2010  . HYPERLIPIDEMIA 02/27/2010  . EXOGENOUS OBESITY 02/27/2010  . HYPERTENSION 02/27/2010  . CVA 02/27/2010  . GERD 02/27/2010  . GASTROINTESTINAL HEMORRHAGE, HX OF 02/27/2010   Past Medical History: Past Medical History  Diagnosis Date  . GI bleed     2006, TCS/TI showed small polyp. EGD showed erosive antral gastritis with two polyp, one oozing and tx. HP neg.  SB capsule shoed few jejunal ulcers with bleeding (naproxen and ASA)  . GI bleed     02/2010, EGD->pancreatitc rest, fundal gland polyps,  no bleeding. TCS->blood-tinged colonic effluent throughout the colon without bleeding lesion found, nl TI. SB capsule->SB erosions, nonbleeding, incomplete study. Patient on naproxyn.   . DM (diabetes mellitus)   . Pneumonia   . Hyperlipidemia   . HTN (hypertension)   . Stroke   . GERD (gastroesophageal reflux disease)   . Poor vision     left eye  . Nephrolithiasis   . Sleep apnea   . Obesity   . Ischemic colitis, enteritis, or enterocolitis     flex sig 01/2010  . Chronic renal insufficiency   . Gastric AVM 10/24/10    GI bleed EGD w/ bicap and hemoclip placement, 2 AVMs, presented w/bright red rectal bleeding  . GI bleed 10/27/10    ileocolonoscopy/GIVENS capsule study by Dr Rourk->bloody colonic effluent throughout colon but no lesion identified, TA @ hepatic flexure, fresh blood in dital SB on capsule. Nuc med RBC study negative  . Tubular adenoma of colon 10/27/10    on colonoscopy Dr Gala Romney   Past Surgical History: Past Surgical History  Procedure Laterality Date  . Back surgery    . Transurethral resection of prostate    . Cataract extraction      bilateral  . Appendectomy    . Cholecystectomy    . Elbow surgery    . Esophagogastroduodenoscopy  10/23/10    Dr. Tora Kindred arteriovenous malformations,GI bleed- capsule  . Colonoscopy  10/27/10    Dr. Chauncy Lean adenoma, normal rectum bloody colonic effluent throughout the colon with out bleeding lesion seen.    Social History: History  Substance Use Topics  . Smoking status: Never Smoker   . Smokeless tobacco: Never Used  . Alcohol Use: No   Additional social history: none  Please also refer to relevant sections of EMR.  Family History: Family History  Problem Relation Age of Onset  . Aneurysm Daughter     brain, deceased age 52  . GI problems Brother     gi bleed  .  Cancer Sister     unknown type  . Colon cancer Neg Hx   . Liver disease Neg Hx    Allergies and Medications: Allergies  Allergen Reactions  . Latex   . Povidone-Iodine   . Propoxyphene     Other reaction(s): Dizziness  . Propoxyphene Hcl   . Sulfa Antibiotics Other (See Comments)  . Sulfonamide Derivatives   . Betadine [Povidone Iodine] Rash   No current facility-administered medications on file prior to encounter.   Current Outpatient Prescriptions on File Prior to Encounter  Medication Sig Dispense Refill  . Cholecalciferol (VITAMIN D) 2000 UNITS CAPS Take 1 capsule by mouth daily.      . Choline Fenofibrate (TRILIPIX) 135 MG capsule Take 135 mg by mouth at bedtime.       . ferrous sulfate 325 (65 FE) MG tablet Take 325 mg by mouth daily.       Marland Kitchen  insulin lispro protamine-lispro (HUMALOG 75/25 MIX) (75-25) 100 UNIT/ML SUSP injection Inject 60-65 Units into the skin 2 (two) times daily with a meal.       . losartan (COZAAR) 50 MG tablet Take 1 tablet (50 mg total) by mouth daily.  90 tablet  3  . nebivolol (BYSTOLIC) 10 MG tablet Take 10 mg by mouth every morning.        . pantoprazole (PROTONIX) 40 MG tablet Take 40 mg by mouth daily.       . potassium chloride (KLOR-CON M10) 10 MEQ tablet Take 1 tablet (10 mEq total) by mouth daily.  90 tablet  0    Objective: BP 109/86  Pulse 71  Temp(Src) 98.6 F (37 C) (Oral)  Resp 17  Ht 5\' 7"  (1.702 m)  Wt 110.678 kg (244 lb)  BMI 38.21 kg/m2  SpO2 95% Exam: General: obese and elderly HEENT: mmm, EOMI, PERRL Cardiovascular: III/VI systolic murmur, RRR Respiratory: CTAB, nml effort Abdomen: non-ttp Extremities: trace LE edema Skin: intact, no rash Neuro: CN 2-12 grossly intact.   Labs and Imaging: Results for orders placed during the hospital encounter of 01/27/14 (from the past 24 hour(s))  CBC WITH DIFFERENTIAL     Status: Abnormal   Collection Time    01/27/14  6:50 PM      Result Value Ref Range   WBC 14.7 (*) 4.0 -  10.5 K/uL   RBC 4.03 (*) 4.22 - 5.81 MIL/uL   Hemoglobin 11.6 (*) 13.0 - 17.0 g/dL   HCT 36.0 (*) 39.0 - 52.0 %   MCV 89.3  78.0 - 100.0 fL   MCH 28.8  26.0 - 34.0 pg   MCHC 32.2  30.0 - 36.0 g/dL   RDW 14.6  11.5 - 15.5 %   Platelets 330  150 - 400 K/uL   Neutrophils Relative % 79 (*) 43 - 77 %   Neutro Abs 11.7 (*) 1.7 - 7.7 K/uL   Lymphocytes Relative 10 (*) 12 - 46 %   Lymphs Abs 1.4  0.7 - 4.0 K/uL   Monocytes Relative 10  3 - 12 %   Monocytes Absolute 1.4 (*) 0.1 - 1.0 K/uL   Eosinophils Relative 1  0 - 5 %   Eosinophils Absolute 0.2  0.0 - 0.7 K/uL   Basophils Relative 0  0 - 1 %   Basophils Absolute 0.0  0.0 - 0.1 K/uL  BASIC METABOLIC PANEL     Status: Abnormal   Collection Time    01/27/14  6:50 PM      Result Value Ref Range   Sodium 139  137 - 147 mEq/L   Potassium 4.1  3.7 - 5.3 mEq/L   Chloride 101  96 - 112 mEq/L   CO2 23  19 - 32 mEq/L   Glucose, Bld 153 (*) 70 - 99 mg/dL   BUN 58 (*) 6 - 23 mg/dL   Creatinine, Ser 3.33 (*) 0.50 - 1.35 mg/dL   Calcium 9.0  8.4 - 10.5 mg/dL   GFR calc non Af Amer 16 (*) >90 mL/min   GFR calc Af Amer 19 (*) >90 mL/min   Anion gap 15  5 - 15  TROPONIN I     Status: None   Collection Time    01/27/14  6:50 PM      Result Value Ref Range   Troponin I <0.30  <0.30 ng/mL  PRO B NATRIURETIC PEPTIDE     Status: Abnormal   Collection  Time    01/27/14  7:23 PM      Result Value Ref Range   Pro B Natriuretic peptide (BNP) 2210.0 (*) 0 - 450 pg/mL  URINALYSIS, ROUTINE W REFLEX MICROSCOPIC     Status: Abnormal   Collection Time    01/27/14  8:34 PM      Result Value Ref Range   Color, Urine YELLOW  YELLOW   APPearance CLEAR  CLEAR   Specific Gravity, Urine 1.015  1.005 - 1.030   pH 5.5  5.0 - 8.0   Glucose, UA NEGATIVE  NEGATIVE mg/dL   Hgb urine dipstick TRACE (*) NEGATIVE   Bilirubin Urine NEGATIVE  NEGATIVE   Ketones, ur NEGATIVE  NEGATIVE mg/dL   Protein, ur 100 (*) NEGATIVE mg/dL   Urobilinogen, UA 0.2  0.0 - 1.0 mg/dL    Nitrite NEGATIVE  NEGATIVE   Leukocytes, UA NEGATIVE  NEGATIVE  URINE MICROSCOPIC-ADD ON     Status: Abnormal   Collection Time    01/27/14  8:34 PM      Result Value Ref Range   WBC, UA 0-2  <3 WBC/hpf   RBC / HPF 0-2  <3 RBC/hpf   Casts GRANULAR CAST (*) NEGATIVE    Ct Abdomen Pelvis Wo Contrast  01/23/2014   CLINICAL DATA:  Left flank pain with history of urinary tract stones  EXAM: CT ABDOMEN AND PELVIS WITHOUT CONTRAST  TECHNIQUE: Multidetector CT imaging of the abdomen and pelvis was performed following the standard protocol without IV contrast.  COMPARISON:  CT scan the abdomen pelvis of April 08, 2012  FINDINGS: The kidneys exhibit subcentimeter parenchymal calcifications but no stones or hydronephrosis. There are cortically based hypodensities on the left. One exophytic from the lateral aspect of the upper pole exhibits HU value of -7 and measures 2.4 cm . A second exophytic from the medial aspect of the midpole has HU value of -28 and measures 1.5 cm on the right there are is a 1 cm diameter mid pole hypodensity with HU value of -3. There is no perinephric fluid collection. No ureteral stones are demonstrated. The urinary bladder is nondistended. The prostate gland and seminal vesicles exhibit no acute abnormalities. The psoas musculature is normal.  The gallbladder is surgically absent. The liver, pancreas, spleen, adrenal glands, and abdominal aorta are normal. There is no lymphadenopathy.  The stomach is distended with food. The small and large bowel exhibit no ileus nor obstruction nor acute inflammation. There is no significant diverticulosis. The appendix is surgically absent.  The lung bases are clear. There are degenerative disc changes of the upper lumbar spine and facet joint degenerative changes inferiorly. The bony pelvis is unremarkable.  IMPRESSION: 1. No urinary tract stones are demonstrated nor is there evidence of urinary tract obstruction. 2. There is no acute  hepatobiliary nor acute bowel abnormality. 3. There are mild degenerative changes of the lumbar spine. 4. These results will be called to the ordering clinician or representative by the Radiology Department at the imaging location.   Electronically Signed   By: Cantrell Martus  [place]   On: 01/23/2014 12:08   Dg Chest Portable 1 View  01/27/2014   CLINICAL DATA:  Generalize weakness.  Shortness of breath.  EXAM: PORTABLE CHEST - 1 VIEW  COMPARISON:  Two-view chest x-ray 06/30/2013, 10/19/2012, 02/18/2011. Portable chest x-ray 10/22/2012.  FINDINGS: Suboptimal inspiration due to body habitus accounts for crowded bronchovascular markings, especially in the bases, and accentuates the cardiac silhouette. Taking this into account, cardiac  silhouette mildly to moderately enlarged, unchanged. Thoracic aorta atherosclerotic, unchanged. Hilar and mediastinal contours otherwise unremarkable. Lungs clear. Bronchovascular markings normal. Pulmonary vascularity normal. No visible pleural effusions. No pneumothorax.  IMPRESSION: Suboptimal inspiration. Stable cardiomegaly. No acute cardiopulmonary disease.   Electronically Signed   By: Evangeline Dakin M.D.   On: 01/27/2014 19:39     Waldemar Dickens, MD Family Medicine 01/27/2014, 9:44 PM [place]

## 2014-01-27 NOTE — ED Notes (Signed)
Pt states his chest pain has been going on for 2 weeks, pt was seen by PCP earlier this week for the same complaint. Pt was given hydocodone for pain at that time. Pt states he believes the hydrocodone is what caused the nausea and dizziness.

## 2014-01-27 NOTE — ED Provider Notes (Signed)
CSN: [id number]     Arrival date & time 01/27/14  1821 History   First MD Initiated Contact with Patient 01/27/14 1904     Chief Complaint  Patient presents with  . Chest Pain  . Nausea     (Consider location/radiation/quality/duration/timing/severity/associated sxs/prior Treatment) Patient is a 80 y.o. male presenting with chest pain. The history is provided by the patient (the pt complains of chest pain and some sob today).  Chest Pain Pain quality: aching   Pain radiates to:  Does not radiate Pain radiates to the back: no   Pain severity:  Moderate Onset quality:  Gradual Timing:  Intermittent Progression:  Waxing and waning Chronicity:  Recurrent Context: not breathing   Associated symptoms: no abdominal pain, no back pain, no cough, no fatigue and no headache     Past Medical History  Diagnosis Date  . GI bleed     2006, TCS/TI showed small polyp. EGD showed erosive antral gastritis with two polyp, one oozing and tx. HP neg. SB capsule shoed few jejunal ulcers with bleeding (naproxen and ASA)  . GI bleed     02/2010, EGD->pancreatitc rest, fundal gland polyps,  no bleeding. TCS->blood-tinged colonic effluent throughout the colon without bleeding lesion found, nl TI. SB capsule->SB erosions, nonbleeding, incomplete study. Patient on naproxyn.   . DM (diabetes mellitus)   . Pneumonia   . Hyperlipidemia   . HTN (hypertension)   . Stroke   . GERD (gastroesophageal reflux disease)   . Poor vision     left eye  . Nephrolithiasis   . Sleep apnea   . Obesity   . Ischemic colitis, enteritis, or enterocolitis     flex sig 01/2010  . Chronic renal insufficiency   . Gastric AVM 10/24/10    GI bleed EGD w/ bicap and hemoclip placement, 2 AVMs, presented w/bright red rectal bleeding  . GI bleed 10/27/10    ileocolonoscopy/GIVENS capsule study by Dr Rourk->bloody colonic effluent throughout colon but no lesion identified, TA @ hepatic flexure, fresh blood in dital SB on capsule. Nuc  med RBC study negative  . Tubular adenoma of colon 10/27/10    on colonoscopy Dr Gala Romney   Past Surgical History  Procedure Laterality Date  . Back surgery    . Transurethral resection of prostate    . Cataract extraction      bilateral  . Appendectomy    . Cholecystectomy    . Elbow surgery    . Esophagogastroduodenoscopy  10/23/10    Dr. Tora Kindred arteriovenous malformations,GI bleed- capsule  . Colonoscopy  10/27/10    Dr. Chauncy Lean adenoma, normal rectum bloody colonic effluent throughout the colon with out bleeding lesion seen.    Family History  Problem Relation Age of Onset  . Aneurysm Daughter     brain, deceased age 81  . GI problems Brother     gi bleed  . Cancer Sister     unknown type  . Colon cancer Neg Hx   . Liver disease Neg Hx    History  Substance Use Topics  . Smoking status: Never Smoker   . Smokeless tobacco: Never Used  . Alcohol Use: No    Review of Systems  Constitutional: Negative for appetite change and fatigue.  HENT: Negative for congestion, ear discharge and sinus pressure.   Eyes: Negative for discharge.  Respiratory: Negative for cough.   Cardiovascular: Positive for chest pain.  Gastrointestinal: Negative for abdominal pain and diarrhea.  Genitourinary: Negative for frequency and  hematuria.  Musculoskeletal: Negative for back pain.  Skin: Negative for rash.  Neurological: Negative for seizures and headaches.  Psychiatric/Behavioral: Negative for hallucinations.      Allergies  Latex; Povidone-iodine; Propoxyphene; Propoxyphene hcl; Sulfa antibiotics; Sulfonamide derivatives; and Betadine  Home Medications   Prior to Admission medications   Medication Sig Start Date End Date Taking? Authorizing Provider  amLODipine (NORVASC) 5 MG tablet Take 7.5 mg by mouth daily.   Yes Historical Provider, MD  Cholecalciferol (VITAMIN D) 2000 UNITS CAPS Take 1 capsule by mouth daily.   Yes Historical Provider, MD  Choline Fenofibrate  (TRILIPIX) 135 MG capsule Take 135 mg by mouth at bedtime.    Yes Historical Provider, MD  ferrous sulfate 325 (65 FE) MG tablet Take 325 mg by mouth daily.    Yes Historical Provider, MD  furosemide (LASIX) 40 MG tablet Take 20-40 mg by mouth 2 (two) times daily. Patient takes 1 tablet in the morning and 1/2 at night   Yes Historical Provider, MD  HYDROcodone-acetaminophen (NORCO) 10-325 MG per tablet Take 1 tablet by mouth 4 (four) times daily. For 10 days started 01/24/14   Yes Historical Provider, MD  insulin lispro protamine-lispro (HUMALOG 75/25 MIX) (75-25) 100 UNIT/ML SUSP injection Inject 60-65 Units into the skin 2 (two) times daily with a meal.    Yes Historical Provider, MD  losartan (COZAAR) 50 MG tablet Take 1 tablet (50 mg total) by mouth daily. 03/06/13  Yes Rebecca Eaton, MD  nebivolol (BYSTOLIC) 10 MG tablet Take 10 mg by mouth every morning.     Yes Historical Provider, MD  omeprazole (PRILOSEC) 40 MG capsule Take 40 mg by mouth daily.   Yes Historical Provider, MD  pantoprazole (PROTONIX) 40 MG tablet Take 40 mg by mouth daily.    Yes Historical Provider, MD  potassium chloride (KLOR-CON M10) 10 MEQ tablet Take 1 tablet (10 mEq total) by mouth daily. 12/19/13  Yes Troy Sine, MD  terazosin (HYTRIN) 10 MG capsule Take 10 mg by mouth at bedtime.   Yes Historical Provider, MD   BP 109/86  Pulse 71  Temp(Src) 98.6 F (37 C) (Oral)  Resp 17  Ht 5\' 7"  (1.702 m)  Wt 244 lb (110.678 kg)  BMI 38.21 kg/m2  SpO2 95% Physical Exam  Constitutional: He is oriented to person, place, and time. He appears well-developed.  HENT:  Head: Normocephalic.  Eyes: Conjunctivae and EOM are normal. No scleral icterus.  Neck: Neck supple. No thyromegaly present.  Cardiovascular: Normal rate and regular rhythm.  Exam reveals no gallop and no friction rub.   No murmur heard. Pulmonary/Chest: No stridor. He has no wheezes. He has no rales. He exhibits no tenderness.  Abdominal: He exhibits  no distension. There is no tenderness. There is no rebound.  Musculoskeletal: Normal range of motion. He exhibits no edema.  Lymphadenopathy:    He has no cervical adenopathy.  Neurological: He is oriented to person, place, and time. He exhibits normal muscle tone. Coordination normal.  Skin: No rash noted. No erythema.  Psychiatric: He has a normal mood and affect. His behavior is normal.    ED Course  Procedures (including critical care time) Labs Review Labs Reviewed  CBC WITH DIFFERENTIAL - Abnormal; Notable for the following:    WBC 14.7 (*)    RBC 4.03 (*)    Hemoglobin 11.6 (*)    HCT 36.0 (*)    Neutrophils Relative % 79 (*)    Neutro Abs 11.7 (*)  Lymphocytes Relative 10 (*)    Monocytes Absolute 1.4 (*)    All other components within normal limits  BASIC METABOLIC PANEL - Abnormal; Notable for the following:    Glucose, Bld 153 (*)    BUN 58 (*)    Creatinine, Ser 3.33 (*)    GFR calc non Af Amer 16 (*)    GFR calc Af Amer 19 (*)    All other components within normal limits  PRO B NATRIURETIC PEPTIDE - Abnormal; Notable for the following:    Pro B Natriuretic peptide (BNP) 2210.0 (*)    All other components within normal limits  URINALYSIS, ROUTINE W REFLEX MICROSCOPIC - Abnormal; Notable for the following:    Hgb urine dipstick TRACE (*)    Protein, ur 100 (*)    All other components within normal limits  URINE MICROSCOPIC-ADD ON - Abnormal; Notable for the following:    Casts GRANULAR CAST (*)    All other components within normal limits  TROPONIN I    Imaging Review Dg Chest Portable 1 View  01/27/2014   CLINICAL DATA:  Generalize weakness.  Shortness of breath.  EXAM: PORTABLE CHEST - 1 VIEW  COMPARISON:  Two-view chest x-ray 06/30/2013, 10/19/2012, 02/18/2011. Portable chest x-ray 10/22/2012.  FINDINGS: Suboptimal inspiration due to body habitus accounts for crowded bronchovascular markings, especially in the bases, and accentuates the cardiac silhouette.  Taking this into account, cardiac silhouette mildly to moderately enlarged, unchanged. Thoracic aorta atherosclerotic, unchanged. Hilar and mediastinal contours otherwise unremarkable. Lungs clear. Bronchovascular markings normal. Pulmonary vascularity normal. No visible pleural effusions. No pneumothorax.  IMPRESSION: Suboptimal inspiration. Stable cardiomegaly. No acute cardiopulmonary disease.   Electronically Signed   By: Evangeline Dakin M.D.   On: 01/27/2014 19:39     EKG Interpretation   Date/Time:  Saturday January 27 2014 18:33:20 EDT Ventricular Rate:  78 PR Interval:  139 QRS Duration: 159 QT Interval:  391 QTC Calculation: 445 R Axis:   80 Text Interpretation:  Sinus rhythm Right bundle branch block Confirmed by  Vestal Crandall  MD, Johnda Billiot [phone]) on 01/27/2014 9:21:45 PM      MDM   Final diagnoses:  Chest pain, unspecified chest pain type    Admit for chest pain rule out   The chart was scribed for me under my direct supervision.  I personally performed the history, physical, and medical decision making and all procedures in the evaluation of this patient.Maudry Diego, MD 01/27/14 2129

## 2014-01-27 NOTE — ED Notes (Signed)
Pt co burning across center of chest.

## 2014-01-27 NOTE — ED Notes (Signed)
Pt states he has been having bouts of urinary and bowel incontinence for unspecified amount of time. Pt states last BM 7 days ago, able to pass flatulence

## 2014-01-27 NOTE — ED Notes (Signed)
Pt states he is not really having any pain at present. States " I just sick " states he has had some diarrhea

## 2014-01-28 DIAGNOSIS — E119 Type 2 diabetes mellitus without complications: Secondary | ICD-10-CM

## 2014-01-28 DIAGNOSIS — R5381 Other malaise: Secondary | ICD-10-CM

## 2014-01-28 DIAGNOSIS — N183 Chronic kidney disease, stage 3 unspecified: Secondary | ICD-10-CM

## 2014-01-28 DIAGNOSIS — E1129 Type 2 diabetes mellitus with other diabetic kidney complication: Secondary | ICD-10-CM

## 2014-01-28 DIAGNOSIS — I10 Essential (primary) hypertension: Secondary | ICD-10-CM

## 2014-01-28 DIAGNOSIS — R079 Chest pain, unspecified: Principal | ICD-10-CM

## 2014-01-28 DIAGNOSIS — N189 Chronic kidney disease, unspecified: Secondary | ICD-10-CM

## 2014-01-28 LAB — CBC
HCT: 38.6 % — ABNORMAL LOW (ref 39.0–52.0)
HEMOGLOBIN: 12.4 g/dL — AB (ref 13.0–17.0)
MCH: 28.6 pg (ref 26.0–34.0)
MCHC: 32.1 g/dL (ref 30.0–36.0)
MCV: 88.9 fL (ref 78.0–100.0)
PLATELETS: 249 10*3/uL (ref 150–400)
RBC: 4.34 MIL/uL (ref 4.22–5.81)
RDW: 14.5 % (ref 11.5–15.5)
WBC: 11.9 10*3/uL — AB (ref 4.0–10.5)

## 2014-01-28 LAB — BASIC METABOLIC PANEL
Anion gap: 16 — ABNORMAL HIGH (ref 5–15)
BUN: 57 mg/dL — ABNORMAL HIGH (ref 6–23)
CALCIUM: 8.9 mg/dL (ref 8.4–10.5)
CO2: 21 mEq/L (ref 19–32)
Chloride: 100 mEq/L (ref 96–112)
Creatinine, Ser: 3.19 mg/dL — ABNORMAL HIGH (ref 0.50–1.35)
GFR calc non Af Amer: 17 mL/min — ABNORMAL LOW (ref >90)
GFR, EST AFRICAN AMERICAN: 20 mL/min — AB (ref >90)
Glucose, Bld: 159 mg/dL — ABNORMAL HIGH (ref 70–99)
POTASSIUM: 3.7 meq/L (ref 3.7–5.3)
SODIUM: 137 meq/L (ref 137–147)

## 2014-01-28 LAB — GLUCOSE, CAPILLARY
GLUCOSE-CAPILLARY: 156 mg/dL — AB (ref 70–99)
Glucose-Capillary: 139 mg/dL — ABNORMAL HIGH (ref 70–99)

## 2014-01-28 LAB — TROPONIN I

## 2014-01-28 NOTE — Progress Notes (Signed)
Patient was agitated about not having any breakfast this am.  I notified Dr. Roderic Palau because th e patient was on a clear liquid diet and I wanted to know if the patient could be advanced since he had no c/o this am of pain or GI symptioms and he had requested to eat.  Breakfast according to the dietary aide was over and I was to offer him some cereal and I asked her if they had a sandwich.  She said they had [place] so I offered it to him and he was frustrated because he wanted a egg sandwich.  I also offered him one of the TV dinners he declined.  He did say he would take the sandwich.  I told him I would be bringing down his am medications and he stated that he already had taken an mouth full of piklls this am and that he didn't need anymore.  I asked him 3 times about the meds but he stayed consistent in the fact that he was not going to take them.

## 2014-01-28 NOTE — Progress Notes (Signed)
[facility] PROGRESS NOTE  Hassen Bruun Terlecki HBZ:[id number] DOB: 07-Jul-1930 DOA: 01/27/2014 PCP: Purvis Kilts, MD  Assessment/Plan: 1. Chest pain. Atypical. Likely related to GI causes. Cardiac enzymes are negative. Continue with Protonix and GI cocktails. Symptoms have resolved 2. Shortness of breath. No pulmonary edema or pneumonia reported on chest x-ray. Patient feels that his symptoms have improved. We'll continue to monitor 3. Diabetes. Continue sliding scale insulin 4. Acute on chronic kidney disease stage III. Possibly related to an element of dehydration. Creatinine was 3.3 on admission. He appears his baseline creatinine is roughly 2.9. Improving with IV fluids. Continue current treatment. Can likely discontinue IV fluids in the morning 5. Leukocytosis. No clear source of infection. Possibly reactive. Trend down with hydration. Continue to follow 6. Generalized weakness. We'll get physical therapy evaluation  Code Status:partial code (no intubation) Family Communication: discussed with patient Disposition Plan: discharge home, possibly in am   Consultants:    Procedures:    Antibiotics:    HPI/Subjective: Patient is upset that he received a [place] sandwich around breakfast, rather than an egg sandwich. He denies any shortness of breath.  Reports that chest pain has improved. Tolerating diet.  Objective: Filed Vitals:   01/28/14 1432  BP: 154/63  Pulse: 73  Temp: 98.2 F (36.8 C)  Resp: 18    Intake/Output Summary (Last 24 hours) at 01/28/14 2036 Last data filed at 01/28/14 1700  Gross per 24 hour  Intake    720 ml  Output   1100 ml  Net   -380 ml   Filed Weights   01/27/14 1831 01/27/14 2310  Weight: 110.678 kg (244 lb) 111.313 kg (245 lb 6.4 oz)    Exam:   General:  NAD  Cardiovascular: S1, S2 RRR  Respiratory: diminished breath sounds, no crackles  Abdomen: soft, nt,nd, bs+  Musculoskeletal: no edema b/l   Data Reviewed: Basic  Metabolic Panel:  Recent Labs Lab 01/27/14 1850 01/28/14 0434  NA 139 137  K 4.1 3.7  CL 101 100  CO2 23 21  GLUCOSE 153* 159*  BUN 58* 57*  CREATININE 3.33* 3.19*  CALCIUM 9.0 8.9   Liver Function Tests: No results found for this basename: AST, ALT, ALKPHOS, BILITOT, PROT, ALBUMIN,  in the last 168 hours No results found for this basename: LIPASE, AMYLASE,  in the last 168 hours No results found for this basename: AMMONIA,  in the last 168 hours CBC:  Recent Labs Lab 01/27/14 1850 01/28/14 0500  WBC 14.7* 11.9*  NEUTROABS 11.7*  --   HGB 11.6* 12.4*  HCT 36.0* 38.6*  MCV 89.3 88.9  PLT 330 249   Cardiac Enzymes:  Recent Labs Lab 01/27/14 1850 01/27/14 2313 01/28/14 0134 01/28/14 0434  TROPONINI <0.30 <0.30 <0.30 <0.30   BNP (last 3 results)  Recent Labs  07/10/13 1415 01/27/14 1923  PROBNP 996.8* 2210.0*   CBG:  Recent Labs Lab 01/28/14 1153 01/28/14 1639  GLUCAP 156* 139*    No results found for this or any previous visit (from the past 240 hour(s)).   Studies: Dg Chest Portable 1 View  01/27/2014   CLINICAL DATA:  Generalize weakness.  Shortness of breath.  EXAM: PORTABLE CHEST - 1 VIEW  COMPARISON:  Two-view chest x-ray 06/30/2013, 10/19/2012, 02/18/2011. Portable chest x-ray 10/22/2012.  FINDINGS: Suboptimal inspiration due to body habitus accounts for crowded bronchovascular markings, especially in the bases, and accentuates the cardiac silhouette. Taking this into account, cardiac silhouette mildly to moderately enlarged, unchanged. Thoracic  aorta atherosclerotic, unchanged. Hilar and mediastinal contours otherwise unremarkable. Lungs clear. Bronchovascular markings normal. Pulmonary vascularity normal. No visible pleural effusions. No pneumothorax.  IMPRESSION: Suboptimal inspiration. Stable cardiomegaly. No acute cardiopulmonary disease.   Electronically Signed   By: Evangeline Dakin M.D.   On: 01/27/2014 19:39    Scheduled Meds: . heparin   5,000 Units Subcutaneous 3 times per day  . heparin  5,000 Units Subcutaneous 3 times per day  . insulin aspart  0-15 Units Subcutaneous TID WC  . pantoprazole  40 mg Oral Daily  . senna  1 tablet Oral BID  . terazosin  10 mg Oral BID   Continuous Infusions: . sodium chloride 100 mL/hr at 01/28/14 0136    Active Problems:   Chest pain    Time spent: 18mins    Rmani Kapusta  [facility] Pager [phone]. If 7PM-7AM, please contact night-coverage at [email or web address], password [facility] 01/28/2014, 8:36 PM  LOS: 1 day

## 2014-01-29 DIAGNOSIS — R5381 Other malaise: Secondary | ICD-10-CM

## 2014-01-29 DIAGNOSIS — N183 Chronic kidney disease, stage 3 unspecified: Secondary | ICD-10-CM | POA: Diagnosis not present

## 2014-01-29 DIAGNOSIS — E1129 Type 2 diabetes mellitus with other diabetic kidney complication: Secondary | ICD-10-CM | POA: Diagnosis not present

## 2014-01-29 DIAGNOSIS — N058 Unspecified nephritic syndrome with other morphologic changes: Secondary | ICD-10-CM

## 2014-01-29 DIAGNOSIS — K219 Gastro-esophageal reflux disease without esophagitis: Secondary | ICD-10-CM

## 2014-01-29 DIAGNOSIS — R079 Chest pain, unspecified: Secondary | ICD-10-CM | POA: Diagnosis not present

## 2014-01-29 DIAGNOSIS — R5383 Other fatigue: Secondary | ICD-10-CM

## 2014-01-29 LAB — BASIC METABOLIC PANEL
ANION GAP: 13 (ref 5–15)
BUN: 53 mg/dL — AB (ref 6–23)
CO2: 24 meq/L (ref 19–32)
CREATININE: 2.86 mg/dL — AB (ref 0.50–1.35)
Calcium: 9 mg/dL (ref 8.4–10.5)
Chloride: 101 mEq/L (ref 96–112)
GFR calc non Af Amer: 19 mL/min — ABNORMAL LOW (ref >90)
GFR, EST AFRICAN AMERICAN: 22 mL/min — AB (ref >90)
Glucose, Bld: 131 mg/dL — ABNORMAL HIGH (ref 70–99)
POTASSIUM: 3.5 meq/L — AB (ref 3.7–5.3)
Sodium: 138 mEq/L (ref 137–147)

## 2014-01-29 LAB — CBC
HCT: 35.9 % — ABNORMAL LOW (ref 39.0–52.0)
Hemoglobin: 11.5 g/dL — ABNORMAL LOW (ref 13.0–17.0)
MCH: 28.4 pg (ref 26.0–34.0)
MCHC: 32 g/dL (ref 30.0–36.0)
MCV: 88.6 fL (ref 78.0–100.0)
Platelets: 316 10*3/uL (ref 150–400)
RBC: 4.05 MIL/uL — AB (ref 4.22–5.81)
RDW: 14.1 % (ref 11.5–15.5)
WBC: 10.7 10*3/uL — ABNORMAL HIGH (ref 4.0–10.5)

## 2014-01-29 LAB — GLUCOSE, CAPILLARY
Glucose-Capillary: 118 mg/dL — ABNORMAL HIGH (ref 70–99)
Glucose-Capillary: 243 mg/dL — ABNORMAL HIGH (ref 70–99)
Glucose-Capillary: 131 mg/dL — ABNORMAL HIGH (ref 70–99)

## 2014-01-29 MED ORDER — POTASSIUM CHLORIDE CRYS ER 20 MEQ PO TBCR
40.0000 meq | EXTENDED_RELEASE_TABLET | Freq: Once | ORAL | Status: AC
  Administered 2014-01-29: 40 meq via ORAL
  Filled 2014-01-29: qty 2

## 2014-01-29 MED ORDER — INSULIN LISPRO PROT & LISPRO (75-25 MIX) 100 UNIT/ML ~~LOC~~ SUSP: 30.0000 [IU] | Freq: Two times a day (BID) | SUBCUTANEOUS | Status: DC

## 2014-01-29 NOTE — Care Management Note (Signed)
    Page 1 of 1   01/29/2014     5:52:09 PM CARE MANAGEMENT NOTE 01/29/2014  Patient:  John [name], John [name]   Account Number:  [id number]  Date Initiated:  01/29/2014  Documentation initiated by:  Vladimir Creeks  Subjective/Objective Assessment:   Admitted with CP and acute on chronic KD, leukocytosis. Pt is from home alone, but is fairly independent in the home. He has Alyssa fro [facility] who visits monthly, and uses [facility] for [facility]     Action/Plan:   Notified Alyssa, and L/[place], RN, [facility] of D/C today, and [facility] of new orders   Anticipated DC Date:  01/29/2014   Anticipated DC Plan:  [place]  CM consult      [place]   Choice offered to / List presented to:  C-1 Patient        [place] arranged  HH-1 RN  [place].   Status of service:  Completed, signed off Medicare Important Message given?  NA - LOS <3 / Initial given by admissions (If response is "NO", the following Medicare IM given date fields will be blank) Date Medicare IM given:   Medicare IM given by:   Date Additional Medicare IM given:   Additional Medicare IM given by:    Discharge Disposition:  [place]  Per UR Regulation:  Reviewed for med. necessity/level of care/duration of stay  If discussed at [place] of Stay Meetings, dates discussed:    Comments:  01/29/14 [place] Aleecia Tapia RN/CM

## 2014-01-29 NOTE — Evaluation (Signed)
Physical Therapy Evaluation Patient Details Name: John [name] MRN: [id number] DOB: 02/04/1931 Today's Date: 01/29/2014   History of Present Illness  John [name] is a 80 y.o. male presenting with CP.  Clinical Impression  Pt is an 80 year old male who presents to physical therapy with dx of chest pain.  Pt reports he lives alone and is (I) with all functional mobility skills.  During evaluation, pt requested use of RW for gait secondary to generalized weakness from "being stuck in bed".  Pt was mod (I) with bed mobility skills, supervision for transfers, and min guard for gait with use of RW for 50 feet.  Pt unable to amb further distances secondary to fatigue with activity.  Noted step through gait pattern, with decreased foot clearance and step length (B).  Recommend continued PT while in the hospital to address strengthening, balance, and activity tolerance for improvement of functional mobility skills with transition to HHPT at discharge.  No DME recommendations as pt has personal equipment.      Follow Up Recommendations Home health PT    Equipment Recommendations  None recommended by PT       Precautions / Restrictions Precautions Precautions: Fall Restrictions Weight Bearing Restrictions: No      Mobility  Bed Mobility Overal bed mobility: Modified Independent             General bed mobility comments: Increased time to complete task.   Transfers Overall transfer level: Needs assistance Equipment used: Rolling walker (2 wheeled) Transfers: Sit to/from Stand Sit to Stand: Supervision            Ambulation/Gait Ambulation/Gait assistance: Min guard Ambulation Distance (Feet): 50 Feet Assistive device: Rolling walker (2 wheeled) Gait Pattern/deviations: Step-through pattern;Decreased step length - right;Decreased step length - left;Decreased dorsiflexion - left;Decreased dorsiflexion - right   Gait velocity interpretation: Below normal speed for  age/gender General Gait Details: Pt requested use of RW for gait secondary to generalized weakness "stuck in bed for a couple of days"      Balance Overall balance assessment: No apparent balance deficits (not formally assessed)                                           Pertinent Vitals/Pain  No pain reported.     Home Living Family/patient expects to be discharged to:: Private residence Living Arrangements: Alone Available Help at Discharge: Family;Available PRN/intermittently Type of Home: House Home Access: Stairs to enter Entrance Stairs-Rails: None Entrance Stairs-Number of Steps: 2 Home Layout: One level Home Equipment: Walker - 2 wheels;Cane - single point Agricultural consultant)      Prior Function Level of Independence: Independent         Comments: Pt reports he has AD, though does not use them.      Hand Dominance   Dominant Hand: Right    Extremity/Trunk Assessment               Lower Extremity Assessment: Generalized weakness         Communication   Communication: No difficulties  Cognition Arousal/Alertness: Awake/alert Behavior During Therapy: WFL for tasks assessed/performed Overall Cognitive Status: Within Functional Limits for tasks assessed                               Assessment/Plan    PT  Assessment Patient needs continued PT services  PT Diagnosis Difficulty walking;Generalized weakness   PT Problem List Decreased strength;Decreased activity tolerance;Decreased mobility  PT Treatment Interventions Balance training;Gait training;Functional mobility training;Therapeutic activities;Therapeutic exercise;Neuromuscular re-education;Patient/family education   PT Goals (Current goals can be found in the Care Plan section) Acute Rehab PT Goals Patient Stated Goal: get stronger PT Goal Formulation: With patient Time For Goal Achievement: 02/12/14 Potential to Achieve Goals: Good    Frequency Min 3X/week    End  of Session Equipment Utilized During Treatment: Gait belt Activity Tolerance: Patient limited by fatigue Patient left: in chair;with call bell/phone within reach;with chair alarm set      Functional Assessment Tool Used: Clinical Judgement Functional Limitation: Mobility: Walking and moving around Mobility: Walking and Moving Around Current Status [phone]): At least 20 percent but less than 40 percent impaired, limited or restricted Mobility: Walking and Moving Around Goal Status [phone]): At least 1 percent but less than 20 percent impaired, limited or restricted    Time: 0806-0831 PT Time Calculation (min): 25 min   Charges:   PT Evaluation $Initial PT Evaluation Tier I: 1 Procedure     PT G Codes:   Functional Assessment Tool Used: Clinical Judgement Functional Limitation: Mobility: Walking and moving around    [facility] 01/29/2014, 8:43 AM

## 2014-01-29 NOTE — Discharge Summary (Signed)
Physician Discharge Summary  John [name] [id number]:[id number] DOB: 04-04-1931 DOA: 01/27/2014  PCP: Purvis Kilts, MD  Admit date: 01/27/2014 Discharge date: 01/29/2014  Time spent: 40 minutes  Recommendations for Outpatient Follow-up:  1. Follow up with primary care physician in one week 2. Patient has been set up with home health PT  Discharge Diagnoses:  Active Problems:   Chest pain likely related to acid reflux  shortness of breath Diabetes Acute on chronic kidney disease stage III Dehydration Leukocytosis Generalized weakness  Discharge Condition: Improved  Diet recommendation: Low salt, low carb  Filed Weights   01/27/14 1831 01/27/14 2310  Weight: 110.678 kg (244 lb) 111.313 kg (245 lb 6.4 oz)    History of present illness and hospital course:  This is an 80 year old gentleman that was admitted to the hospital with chest discomfort, generalized weakness. He described his symptoms as related to indigestion. Since he has significant medical history, he was admitted to the hospital where cardiac markers have been negative and EKG was nonacute, arguing against ACS. He was treated with proton pump inhibitors GI cocktail and had improvement in his symptoms. He was likely that his chest discomfort was related to GI causes. He is advised to continue on a proton pump inhibitor. The patient was also noted to be dehydrated. His creatinine was above his baseline. He was started on intravenous fluids with creatinine returning back to baseline levels. He was seen by physical therapy and recommended home health physical therapy. Leukocytosis was present, but he did not have any clear source of infection, this may be related to his dehydration. Patient is otherwise stable for discharge home for outpatient followup.  Procedures:    Consultations:    Discharge Exam: Filed Vitals:   01/29/14 1319  BP: 127/60  Pulse: 74  Temp: 98.2 F (36.8 C)  Resp: 18    General: No acute  distress Cardiovascular: S1, S2, regular rate and rhythm Respiratory: Clear to auscultation bilaterally  Discharge Instructions You were cared for by a hospitalist during your hospital stay. If you have any questions about your discharge medications or the care you received while you were in the hospital after you are discharged, you can call the unit and asked to speak with the hospitalist on call if the hospitalist that took care of you is not available. Once you are discharged, your primary care physician will handle any further medical issues. Please note that NO REFILLS for any discharge medications will be authorized once you are discharged, as it is imperative that you return to your primary care physician (or establish a relationship with a primary care physician if you do not have one) for your aftercare needs so that they can reassess your need for medications and monitor your lab values.  Discharge Instructions   Call MD for:  extreme fatigue    Complete by:  As directed      Call MD for:  persistant dizziness or light-headedness    Complete by:  As directed      Diet - low sodium heart healthy    Complete by:  As directed      Diet Carb Modified    Complete by:  As directed      Face-to-face encounter (required for Medicare/Medicaid patients)    Complete by:  As directed   I MEMON,JEHANZEB certify that this patient is under my care and that I, or a nurse practitioner or physician's assistant working with me, had a face-to-face encounter that meets  the physician face-to-face encounter requirements with this patient on 01/29/2014. The encounter with the patient was in whole, or in part for the following medical condition(s) which is the primary reason for home health care (List medical condition): patient admitted with chest pain, deconditioning and dehydration.  Would benefit from home health RN and PT  The encounter with the patient was in whole, or in part, for the following medical  condition, which is the primary reason for home health care:  dehydration, deconditioning  I certify that, based on my findings, the following services are medically necessary home health services:   Nursing Physical therapy    My clinical findings support the need for the above services:  Shortness of breath with activity  Further, I certify that my clinical findings support that this patient is homebound due to:  Shortness of Breath with activity  Reason for Medically Necessary Home Health Services:  Skilled Nursing- Teaching of Disease Process/Symptom Management     Home Health    Complete by:  As directed   To provide the following care/treatments:   RN PT       Increase activity slowly    Complete by:  As directed             Medication List    STOP taking these medications       losartan 50 MG tablet  Commonly known as:  COZAAR      TAKE these medications       amLODipine 5 MG tablet  Commonly known as:  NORVASC  Take 7.5 mg by mouth daily.     ferrous sulfate 325 (65 FE) MG tablet  Take 325 mg by mouth daily.     furosemide 40 MG tablet  Commonly known as:  LASIX  Take 20-40 mg by mouth 2 (two) times daily. Patient takes 1 tablet in the morning and 1/2 at night     HYDROcodone-acetaminophen 10-325 MG per tablet  Commonly known as:  NORCO  Take 1 tablet by mouth 4 (four) times daily. For 10 days started 01/24/14     insulin lispro protamine-lispro (75-25) 100 UNIT/ML Susp injection  Commonly known as:  HUMALOG 75/25 MIX  Inject 30 Units into the skin 2 (two) times daily with a meal.     nebivolol 10 MG tablet  Commonly known as:  BYSTOLIC  Take 10 mg by mouth every morning.     omeprazole 40 MG capsule  Commonly known as:  PRILOSEC  Take 40 mg by mouth daily.     pantoprazole 40 MG tablet  Commonly known as:  PROTONIX  Take 40 mg by mouth daily.     potassium chloride 10 MEQ tablet  Commonly known as:  KLOR-CON M10  Take 1 tablet (10 mEq total) by  mouth daily.     terazosin 10 MG capsule  Commonly known as:  HYTRIN  Take 10 mg by mouth at bedtime.     TRILIPIX 135 MG capsule  Generic drug:  Choline Fenofibrate  Take 135 mg by mouth at bedtime.     Vitamin D 2000 UNITS Caps  Take 1 capsule by mouth daily.       Allergies  Allergen Reactions  . Latex   . Povidone-Iodine   . Propoxyphene     Other reaction(s): Dizziness  . Propoxyphene Hcl   . Sulfa Antibiotics Other (See Comments)  . Sulfonamide Derivatives   . Betadine [Povidone Iodine] Rash      The results  of significant diagnostics from this hospitalization (including imaging, microbiology, ancillary and laboratory) are listed below for reference.    Significant Diagnostic Studies: Ct Abdomen Pelvis Wo Contrast  01/23/2014   CLINICAL DATA:  Left flank pain with history of urinary tract stones  EXAM: CT ABDOMEN AND PELVIS WITHOUT CONTRAST  TECHNIQUE: Multidetector CT imaging of the abdomen and pelvis was performed following the standard protocol without IV contrast.  COMPARISON:  CT scan the abdomen pelvis of April 08, 2012  FINDINGS: The kidneys exhibit subcentimeter parenchymal calcifications but no stones or hydronephrosis. There are cortically based hypodensities on the left. One exophytic from the lateral aspect of the upper pole exhibits HU value of -7 and measures 2.4 cm . A second exophytic from the medial aspect of the midpole has HU value of -28 and measures 1.5 cm on the right there are is a 1 cm diameter mid pole hypodensity with HU value of -3. There is no perinephric fluid collection. No ureteral stones are demonstrated. The urinary bladder is nondistended. The prostate gland and seminal vesicles exhibit no acute abnormalities. The psoas musculature is normal.  The gallbladder is surgically absent. The liver, pancreas, spleen, adrenal glands, and abdominal aorta are normal. There is no lymphadenopathy.  The stomach is distended with food. The small and large  bowel exhibit no ileus nor obstruction nor acute inflammation. There is no significant diverticulosis. The appendix is surgically absent.  The lung bases are clear. There are degenerative disc changes of the upper lumbar spine and facet joint degenerative changes inferiorly. The bony pelvis is unremarkable.  IMPRESSION: 1. No urinary tract stones are demonstrated nor is there evidence of urinary tract obstruction. 2. There is no acute hepatobiliary nor acute bowel abnormality. 3. There are mild degenerative changes of the lumbar spine. 4. These results will be called to the ordering clinician or representative by the Radiology Department at the imaging location.   Electronically Signed   By: David  Martinique   On: 01/23/2014 12:08   Dg Chest Portable 1 View  01/27/2014   CLINICAL DATA:  Generalize weakness.  Shortness of breath.  EXAM: PORTABLE CHEST - 1 VIEW  COMPARISON:  Two-view chest x-ray 06/30/2013, 10/19/2012, 02/18/2011. Portable chest x-ray 10/22/2012.  FINDINGS: Suboptimal inspiration due to body habitus accounts for crowded bronchovascular markings, especially in the bases, and accentuates the cardiac silhouette. Taking this into account, cardiac silhouette mildly to moderately enlarged, unchanged. Thoracic aorta atherosclerotic, unchanged. Hilar and mediastinal contours otherwise unremarkable. Lungs clear. Bronchovascular markings normal. Pulmonary vascularity normal. No visible pleural effusions. No pneumothorax.  IMPRESSION: Suboptimal inspiration. Stable cardiomegaly. No acute cardiopulmonary disease.   Electronically Signed   By: Evangeline Dakin M.D.   On: 01/27/2014 19:39    Microbiology: No results found for this or any previous visit (from the past 240 hour(s)).   Labs: Basic Metabolic Panel:  Recent Labs Lab 01/27/14 1850 01/28/14 0434 01/29/14 0608  NA 139 137 138  K 4.1 3.7 3.5*  CL 101 100 101  CO2 23 21 24   GLUCOSE 153* 159* 131*  BUN 58* 57* 53*  CREATININE 3.33* 3.19*  2.86*  CALCIUM 9.0 8.9 9.0   Liver Function Tests: No results found for this basename: AST, ALT, ALKPHOS, BILITOT, PROT, ALBUMIN,  in the last 168 hours No results found for this basename: LIPASE, AMYLASE,  in the last 168 hours No results found for this basename: AMMONIA,  in the last 168 hours CBC:  Recent Labs Lab 01/27/14 1850 01/28/14 0500  01/29/14 0608  WBC 14.7* 11.9* 10.7*  NEUTROABS 11.7*  --   --   HGB 11.6* 12.4* 11.5*  HCT 36.0* 38.6* 35.9*  MCV 89.3 88.9 88.6  PLT 330 249 316   Cardiac Enzymes:  Recent Labs Lab 01/27/14 1850 01/27/14 2313 01/28/14 0134 01/28/14 0434  TROPONINI <0.30 <0.30 <0.30 <0.30   BNP: BNP (last 3 results)  Recent Labs  07/10/13 1415 01/27/14 1923  PROBNP 996.8* 2210.0*   CBG:  Recent Labs Lab 01/28/14 1153 01/28/14 1639 01/29/14 0713 01/29/14 1109 01/29/14 1630  GLUCAP 156* 139* 118* 243* 131*       Signed:  MEMON,JEHANZEB  [facility] 01/29/2014, 8:11 PM

## 2014-01-31 DIAGNOSIS — E119 Type 2 diabetes mellitus without complications: Secondary | ICD-10-CM | POA: Diagnosis not present

## 2014-01-31 DIAGNOSIS — Z794 Long term (current) use of insulin: Secondary | ICD-10-CM | POA: Diagnosis not present

## 2014-01-31 DIAGNOSIS — I509 Heart failure, unspecified: Secondary | ICD-10-CM | POA: Diagnosis not present

## 2014-01-31 DIAGNOSIS — I129 Hypertensive chronic kidney disease with stage 1 through stage 4 chronic kidney disease, or unspecified chronic kidney disease: Secondary | ICD-10-CM | POA: Diagnosis not present

## 2014-01-31 DIAGNOSIS — N183 Chronic kidney disease, stage 3 unspecified: Secondary | ICD-10-CM | POA: Diagnosis not present

## 2014-02-02 DIAGNOSIS — E119 Type 2 diabetes mellitus without complications: Secondary | ICD-10-CM | POA: Diagnosis not present

## 2014-02-02 DIAGNOSIS — Z794 Long term (current) use of insulin: Secondary | ICD-10-CM | POA: Diagnosis not present

## 2014-02-02 DIAGNOSIS — I509 Heart failure, unspecified: Secondary | ICD-10-CM | POA: Diagnosis not present

## 2014-02-02 DIAGNOSIS — I129 Hypertensive chronic kidney disease with stage 1 through stage 4 chronic kidney disease, or unspecified chronic kidney disease: Secondary | ICD-10-CM | POA: Diagnosis not present

## 2014-02-02 DIAGNOSIS — N183 Chronic kidney disease, stage 3 unspecified: Secondary | ICD-10-CM | POA: Diagnosis not present

## 2014-02-05 DIAGNOSIS — N189 Chronic kidney disease, unspecified: Secondary | ICD-10-CM | POA: Diagnosis not present

## 2014-02-05 DIAGNOSIS — Z794 Long term (current) use of insulin: Secondary | ICD-10-CM | POA: Diagnosis not present

## 2014-02-05 DIAGNOSIS — E119 Type 2 diabetes mellitus without complications: Secondary | ICD-10-CM | POA: Diagnosis not present

## 2014-02-05 DIAGNOSIS — N183 Chronic kidney disease, stage 3 unspecified: Secondary | ICD-10-CM | POA: Diagnosis not present

## 2014-02-05 DIAGNOSIS — R809 Proteinuria, unspecified: Secondary | ICD-10-CM | POA: Diagnosis not present

## 2014-02-05 DIAGNOSIS — Z79899 Other long term (current) drug therapy: Secondary | ICD-10-CM | POA: Diagnosis not present

## 2014-02-05 DIAGNOSIS — I129 Hypertensive chronic kidney disease with stage 1 through stage 4 chronic kidney disease, or unspecified chronic kidney disease: Secondary | ICD-10-CM | POA: Diagnosis not present

## 2014-02-05 DIAGNOSIS — I10 Essential (primary) hypertension: Secondary | ICD-10-CM | POA: Diagnosis not present

## 2014-02-05 DIAGNOSIS — D649 Anemia, unspecified: Secondary | ICD-10-CM | POA: Diagnosis not present

## 2014-02-05 DIAGNOSIS — E559 Vitamin D deficiency, unspecified: Secondary | ICD-10-CM | POA: Diagnosis not present

## 2014-02-05 DIAGNOSIS — I509 Heart failure, unspecified: Secondary | ICD-10-CM | POA: Diagnosis not present

## 2014-02-07 DIAGNOSIS — I509 Heart failure, unspecified: Secondary | ICD-10-CM | POA: Diagnosis not present

## 2014-02-07 DIAGNOSIS — N184 Chronic kidney disease, stage 4 (severe): Secondary | ICD-10-CM | POA: Diagnosis not present

## 2014-02-07 DIAGNOSIS — I10 Essential (primary) hypertension: Secondary | ICD-10-CM | POA: Diagnosis not present

## 2014-02-07 DIAGNOSIS — D649 Anemia, unspecified: Secondary | ICD-10-CM | POA: Diagnosis not present

## 2014-02-08 DIAGNOSIS — I509 Heart failure, unspecified: Secondary | ICD-10-CM | POA: Diagnosis not present

## 2014-02-08 DIAGNOSIS — N183 Chronic kidney disease, stage 3 unspecified: Secondary | ICD-10-CM | POA: Diagnosis not present

## 2014-02-08 DIAGNOSIS — I129 Hypertensive chronic kidney disease with stage 1 through stage 4 chronic kidney disease, or unspecified chronic kidney disease: Secondary | ICD-10-CM | POA: Diagnosis not present

## 2014-02-08 DIAGNOSIS — Z794 Long term (current) use of insulin: Secondary | ICD-10-CM | POA: Diagnosis not present

## 2014-02-08 DIAGNOSIS — E119 Type 2 diabetes mellitus without complications: Secondary | ICD-10-CM | POA: Diagnosis not present

## 2014-02-09 DIAGNOSIS — I2510 Atherosclerotic heart disease of native coronary artery without angina pectoris: Secondary | ICD-10-CM | POA: Diagnosis not present

## 2014-02-09 DIAGNOSIS — R55 Syncope and collapse: Secondary | ICD-10-CM | POA: Diagnosis not present

## 2014-02-09 DIAGNOSIS — N184 Chronic kidney disease, stage 4 (severe): Secondary | ICD-10-CM | POA: Diagnosis not present

## 2014-02-09 DIAGNOSIS — E119 Type 2 diabetes mellitus without complications: Secondary | ICD-10-CM | POA: Diagnosis not present

## 2014-02-09 DIAGNOSIS — R509 Fever, unspecified: Secondary | ICD-10-CM | POA: Diagnosis not present

## 2014-02-09 DIAGNOSIS — I509 Heart failure, unspecified: Secondary | ICD-10-CM | POA: Diagnosis not present

## 2014-02-09 DIAGNOSIS — Z794 Long term (current) use of insulin: Secondary | ICD-10-CM | POA: Diagnosis not present

## 2014-02-09 DIAGNOSIS — I10 Essential (primary) hypertension: Secondary | ICD-10-CM | POA: Diagnosis not present

## 2014-02-09 DIAGNOSIS — R079 Chest pain, unspecified: Secondary | ICD-10-CM | POA: Diagnosis not present

## 2014-02-09 DIAGNOSIS — I129 Hypertensive chronic kidney disease with stage 1 through stage 4 chronic kidney disease, or unspecified chronic kidney disease: Secondary | ICD-10-CM | POA: Diagnosis not present

## 2014-02-09 DIAGNOSIS — N183 Chronic kidney disease, stage 3 unspecified: Secondary | ICD-10-CM | POA: Diagnosis not present

## 2014-02-10 DIAGNOSIS — N390 Urinary tract infection, site not specified: Secondary | ICD-10-CM | POA: Diagnosis not present

## 2014-02-10 DIAGNOSIS — Z6835 Body mass index (BMI) 35.0-35.9, adult: Secondary | ICD-10-CM | POA: Diagnosis not present

## 2014-02-10 DIAGNOSIS — N400 Enlarged prostate without lower urinary tract symptoms: Secondary | ICD-10-CM | POA: Diagnosis not present

## 2014-02-10 DIAGNOSIS — R079 Chest pain, unspecified: Secondary | ICD-10-CM | POA: Diagnosis not present

## 2014-02-12 ENCOUNTER — Emergency Department (HOSPITAL_COMMUNITY): Payer: Medicare Other

## 2014-02-12 ENCOUNTER — Inpatient Hospital Stay (HOSPITAL_COMMUNITY)
Admission: EM | Admit: 2014-02-12 | Discharge: 2014-02-19 | DRG: 394 | Disposition: A | Discharge status: Home Health | Payer: Medicare Other | Attending: Family Medicine | Admitting: Family Medicine

## 2014-02-12 ENCOUNTER — Encounter (HOSPITAL_COMMUNITY): Payer: Self-pay | Admitting: Emergency Medicine

## 2014-02-12 DIAGNOSIS — D62 Acute posthemorrhagic anemia: Secondary | ICD-10-CM | POA: Diagnosis present

## 2014-02-12 DIAGNOSIS — E860 Dehydration: Secondary | ICD-10-CM | POA: Diagnosis present

## 2014-02-12 DIAGNOSIS — D126 Benign neoplasm of colon, unspecified: Secondary | ICD-10-CM | POA: Diagnosis present

## 2014-02-12 DIAGNOSIS — D649 Anemia, unspecified: Secondary | ICD-10-CM | POA: Diagnosis present

## 2014-02-12 DIAGNOSIS — I10 Essential (primary) hypertension: Secondary | ICD-10-CM

## 2014-02-12 DIAGNOSIS — E119 Type 2 diabetes mellitus without complications: Secondary | ICD-10-CM

## 2014-02-12 DIAGNOSIS — K5900 Constipation, unspecified: Secondary | ICD-10-CM | POA: Diagnosis present

## 2014-02-12 DIAGNOSIS — Z6838 Body mass index (BMI) 38.0-38.9, adult: Secondary | ICD-10-CM | POA: Diagnosis not present

## 2014-02-12 DIAGNOSIS — N184 Chronic kidney disease, stage 4 (severe): Secondary | ICD-10-CM | POA: Diagnosis present

## 2014-02-12 DIAGNOSIS — Z794 Long term (current) use of insulin: Secondary | ICD-10-CM

## 2014-02-12 DIAGNOSIS — E1122 Type 2 diabetes mellitus with diabetic chronic kidney disease: Secondary | ICD-10-CM

## 2014-02-12 DIAGNOSIS — Z8673 Personal history of transient ischemic attack (TIA), and cerebral infarction without residual deficits: Secondary | ICD-10-CM

## 2014-02-12 DIAGNOSIS — K219 Gastro-esophageal reflux disease without esophagitis: Secondary | ICD-10-CM | POA: Diagnosis present

## 2014-02-12 DIAGNOSIS — K922 Gastrointestinal hemorrhage, unspecified: Secondary | ICD-10-CM | POA: Diagnosis not present

## 2014-02-12 DIAGNOSIS — R079 Chest pain, unspecified: Secondary | ICD-10-CM | POA: Diagnosis not present

## 2014-02-12 DIAGNOSIS — I129 Hypertensive chronic kidney disease with stage 1 through stage 4 chronic kidney disease, or unspecified chronic kidney disease: Secondary | ICD-10-CM | POA: Diagnosis present

## 2014-02-12 DIAGNOSIS — G4730 Sleep apnea, unspecified: Secondary | ICD-10-CM | POA: Diagnosis present

## 2014-02-12 DIAGNOSIS — K259 Gastric ulcer, unspecified as acute or chronic, without hemorrhage or perforation: Secondary | ICD-10-CM | POA: Diagnosis present

## 2014-02-12 DIAGNOSIS — Z79899 Other long term (current) drug therapy: Secondary | ICD-10-CM

## 2014-02-12 DIAGNOSIS — K921 Melena: Secondary | ICD-10-CM

## 2014-02-12 DIAGNOSIS — K253 Acute gastric ulcer without hemorrhage or perforation: Secondary | ICD-10-CM | POA: Diagnosis not present

## 2014-02-12 DIAGNOSIS — E785 Hyperlipidemia, unspecified: Secondary | ICD-10-CM | POA: Diagnosis present

## 2014-02-12 DIAGNOSIS — D638 Anemia in other chronic diseases classified elsewhere: Secondary | ICD-10-CM | POA: Diagnosis present

## 2014-02-12 DIAGNOSIS — E669 Obesity, unspecified: Secondary | ICD-10-CM | POA: Diagnosis present

## 2014-02-12 DIAGNOSIS — D72829 Elevated white blood cell count, unspecified: Secondary | ICD-10-CM | POA: Diagnosis present

## 2014-02-12 DIAGNOSIS — N19 Unspecified kidney failure: Secondary | ICD-10-CM

## 2014-02-12 DIAGNOSIS — K633 Ulcer of intestine: Principal | ICD-10-CM | POA: Diagnosis present

## 2014-02-12 DIAGNOSIS — N189 Chronic kidney disease, unspecified: Secondary | ICD-10-CM

## 2014-02-12 DIAGNOSIS — R531 Weakness: Secondary | ICD-10-CM

## 2014-02-12 DIAGNOSIS — R5381 Other malaise: Secondary | ICD-10-CM

## 2014-02-12 DIAGNOSIS — K648 Other hemorrhoids: Secondary | ICD-10-CM | POA: Diagnosis present

## 2014-02-12 DIAGNOSIS — H547 Unspecified visual loss: Secondary | ICD-10-CM | POA: Diagnosis present

## 2014-02-12 DIAGNOSIS — R5383 Other fatigue: Secondary | ICD-10-CM

## 2014-02-12 DIAGNOSIS — K319 Disease of stomach and duodenum, unspecified: Secondary | ICD-10-CM | POA: Diagnosis not present

## 2014-02-12 DIAGNOSIS — N179 Acute kidney failure, unspecified: Secondary | ICD-10-CM | POA: Diagnosis present

## 2014-02-12 DIAGNOSIS — E1129 Type 2 diabetes mellitus with other diabetic kidney complication: Secondary | ICD-10-CM | POA: Diagnosis not present

## 2014-02-12 LAB — URINALYSIS, ROUTINE W REFLEX MICROSCOPIC
Bilirubin Urine: NEGATIVE
GLUCOSE, UA: NEGATIVE mg/dL
HGB URINE DIPSTICK: NEGATIVE
Ketones, ur: NEGATIVE mg/dL
Leukocytes, UA: NEGATIVE
Nitrite: NEGATIVE
Protein, ur: 30 mg/dL — AB
SPECIFIC GRAVITY, URINE: 1.02 (ref 1.005–1.030)
Urobilinogen, UA: 0.2 mg/dL (ref 0.0–1.0)
pH: 5.5 (ref 5.0–8.0)

## 2014-02-12 LAB — CBC WITH DIFFERENTIAL/PLATELET
Basophils Absolute: 0 10*3/uL (ref 0.0–0.1)
Basophils Relative: 0 % (ref 0–1)
EOS ABS: 0 10*3/uL (ref 0.0–0.7)
EOS PCT: 0 % (ref 0–5)
HEMATOCRIT: 32.5 % — AB (ref 39.0–52.0)
Hemoglobin: 10.3 g/dL — ABNORMAL LOW (ref 13.0–17.0)
LYMPHS ABS: 1.1 10*3/uL (ref 0.7–4.0)
LYMPHS PCT: 6 % — AB (ref 12–46)
MCH: 28.8 pg (ref 26.0–34.0)
MCHC: 31.7 g/dL (ref 30.0–36.0)
MCV: 90.8 fL (ref 78.0–100.0)
MONO ABS: 1.1 10*3/uL — AB (ref 0.1–1.0)
Monocytes Relative: 6 % (ref 3–12)
Neutro Abs: 17.1 10*3/uL — ABNORMAL HIGH (ref 1.7–7.7)
Neutrophils Relative %: 88 % — ABNORMAL HIGH (ref 43–77)
Platelets: 333 10*3/uL (ref 150–400)
RBC: 3.58 MIL/uL — AB (ref 4.22–5.81)
RDW: 14.9 % (ref 11.5–15.5)
WBC: 19.3 10*3/uL — AB (ref 4.0–10.5)

## 2014-02-12 LAB — BASIC METABOLIC PANEL
Anion gap: 17 — ABNORMAL HIGH (ref 5–15)
BUN: 80 mg/dL — ABNORMAL HIGH (ref 6–23)
CALCIUM: 9.2 mg/dL (ref 8.4–10.5)
CO2: 21 meq/L (ref 19–32)
CREATININE: 4.4 mg/dL — AB (ref 0.50–1.35)
Chloride: 106 mEq/L (ref 96–112)
GFR calc Af Amer: 13 mL/min — ABNORMAL LOW (ref >90)
GFR calc non Af Amer: 11 mL/min — ABNORMAL LOW (ref >90)
GLUCOSE: 163 mg/dL — AB (ref 70–99)
Potassium: 4.5 mEq/L (ref 3.7–5.3)
Sodium: 144 mEq/L (ref 137–147)

## 2014-02-12 LAB — CBC
HCT: 29.6 % — ABNORMAL LOW (ref 39.0–52.0)
Hemoglobin: 9.3 g/dL — ABNORMAL LOW (ref 13.0–17.0)
MCH: 28.4 pg (ref 26.0–34.0)
MCHC: 31.4 g/dL (ref 30.0–36.0)
MCV: 90.2 fL (ref 78.0–100.0)
Platelets: 289 10*3/uL (ref 150–400)
RBC: 3.28 MIL/uL — ABNORMAL LOW (ref 4.22–5.81)
RDW: 15 % (ref 11.5–15.5)
WBC: 16.2 10*3/uL — AB (ref 4.0–10.5)

## 2014-02-12 LAB — GLUCOSE, CAPILLARY
GLUCOSE-CAPILLARY: 143 mg/dL — AB (ref 70–99)
Glucose-Capillary: 112 mg/dL — ABNORMAL HIGH (ref 70–99)

## 2014-02-12 LAB — PRO B NATRIURETIC PEPTIDE: Pro B Natriuretic peptide (BNP): 507.3 pg/mL — ABNORMAL HIGH (ref 0–450)

## 2014-02-12 LAB — URINE MICROSCOPIC-ADD ON

## 2014-02-12 LAB — TROPONIN I: Troponin I: <0.3 ng/mL (ref <0.30)

## 2014-02-12 LAB — OCCULT BLOOD, POC DEVICE: FECAL OCCULT BLD: POSITIVE — AB

## 2014-02-12 MED ORDER — GI COCKTAIL ~~LOC~~
30.0000 mL | Freq: Once | ORAL | Status: AC
  Administered 2014-02-12: 30 mL via ORAL
  Filled 2014-02-12: qty 30

## 2014-02-12 MED ORDER — TAMSULOSIN HCL 0.4 MG PO CAPS
0.4000 mg | ORAL_CAPSULE | Freq: Every day | ORAL | Status: DC
  Administered 2014-02-12 – 2014-02-19 (×8): 0.4 mg via ORAL
  Filled 2014-02-12 (×8): qty 1

## 2014-02-12 MED ORDER — IPRATROPIUM-ALBUTEROL 0.5-2.5 (3) MG/3ML IN SOLN
3.0000 mL | Freq: Three times a day (TID) | RESPIRATORY_TRACT | Status: DC
  Administered 2014-02-13 – 2014-02-19 (×19): 3 mL via RESPIRATORY_TRACT
  Filled 2014-02-12 (×20): qty 3

## 2014-02-12 MED ORDER — NEBIVOLOL HCL 10 MG PO TABS
10.0000 mg | ORAL_TABLET | ORAL | Status: DC
  Administered 2014-02-14 – 2014-02-19 (×6): 10 mg via ORAL
  Filled 2014-02-12 (×9): qty 1

## 2014-02-12 MED ORDER — TERAZOSIN HCL 5 MG PO CAPS
10.0000 mg | ORAL_CAPSULE | Freq: Every day | ORAL | Status: DC
  Administered 2014-02-12 – 2014-02-18 (×7): 10 mg via ORAL
  Filled 2014-02-12 (×7): qty 2

## 2014-02-12 MED ORDER — PANTOPRAZOLE SODIUM 40 MG IV SOLR
40.0000 mg | Freq: Once | INTRAVENOUS | Status: AC
  Administered 2014-02-12: 40 mg via INTRAVENOUS
  Filled 2014-02-12: qty 40

## 2014-02-12 MED ORDER — ALBUTEROL SULFATE (2.5 MG/3ML) 0.083% IN NEBU: 2.5000 mg | INHALATION_SOLUTION | RESPIRATORY_TRACT | Status: DC | PRN

## 2014-02-12 MED ORDER — INSULIN ASPART PROT & ASPART (70-30 MIX) 100 UNIT/ML ~~LOC~~ SUSP
SUBCUTANEOUS | Status: AC
  Filled 2014-02-12: qty 10

## 2014-02-12 MED ORDER — ONDANSETRON HCL 4 MG PO TABS
4.0000 mg | ORAL_TABLET | Freq: Four times a day (QID) | ORAL | Status: DC | PRN
  Administered 2014-02-13 – 2014-02-15 (×2): 4 mg via ORAL
  Filled 2014-02-12 (×2): qty 1

## 2014-02-12 MED ORDER — SODIUM CHLORIDE 0.9 % IV SOLN
INTRAVENOUS | Status: DC
  Administered 2014-02-12 – 2014-02-14 (×3): via INTRAVENOUS

## 2014-02-12 MED ORDER — ALBUTEROL SULFATE (2.5 MG/3ML) 0.083% IN NEBU: 2.5000 mg | INHALATION_SOLUTION | Freq: Four times a day (QID) | RESPIRATORY_TRACT | Status: DC

## 2014-02-12 MED ORDER — ONDANSETRON HCL 4 MG/2ML IJ SOLN
4.0000 mg | Freq: Four times a day (QID) | INTRAMUSCULAR | Status: DC | PRN
  Administered 2014-02-18 – 2014-02-19 (×2): 4 mg via INTRAVENOUS
  Filled 2014-02-12 (×3): qty 2

## 2014-02-12 MED ORDER — IPRATROPIUM-ALBUTEROL 0.5-2.5 (3) MG/3ML IN SOLN
3.0000 mL | Freq: Four times a day (QID) | RESPIRATORY_TRACT | Status: DC
  Administered 2014-02-12: 3 mL via RESPIRATORY_TRACT
  Filled 2014-02-12: qty 3

## 2014-02-12 MED ORDER — IPRATROPIUM BROMIDE 0.02 % IN SOLN: 0.5000 mg | Freq: Four times a day (QID) | RESPIRATORY_TRACT | Status: DC

## 2014-02-12 MED ORDER — INSULIN ASPART PROT & ASPART (70-30 MIX) 100 UNIT/ML ~~LOC~~ SUSP
15.0000 [IU] | Freq: Two times a day (BID) | SUBCUTANEOUS | Status: DC
  Administered 2014-02-12 – 2014-02-15 (×4): 15 [IU] via SUBCUTANEOUS
  Filled 2014-02-12: qty 10

## 2014-02-12 MED ORDER — ACETAMINOPHEN 650 MG RE SUPP: 650.0000 mg | Freq: Four times a day (QID) | RECTAL | Status: DC | PRN

## 2014-02-12 MED ORDER — PANTOPRAZOLE SODIUM 40 MG IV SOLR
40.0000 mg | Freq: Two times a day (BID) | INTRAVENOUS | Status: DC
  Administered 2014-02-12 – 2014-02-13 (×3): 40 mg via INTRAVENOUS
  Filled 2014-02-12 (×4): qty 40

## 2014-02-12 MED ORDER — ASPIRIN 81 MG PO CHEW
324.0000 mg | CHEWABLE_TABLET | Freq: Once | ORAL | Status: AC
  Administered 2014-02-12: 324 mg via ORAL
  Filled 2014-02-12: qty 4

## 2014-02-12 MED ORDER — INSULIN ASPART 100 UNIT/ML ~~LOC~~ SOLN: 0.0000 [IU] | Freq: Every day | SUBCUTANEOUS | Status: DC

## 2014-02-12 MED ORDER — INSULIN ASPART 100 UNIT/ML ~~LOC~~ SOLN
0.0000 [IU] | Freq: Three times a day (TID) | SUBCUTANEOUS | Status: DC
  Administered 2014-02-13 – 2014-02-17 (×5): 2 [IU] via SUBCUTANEOUS
  Administered 2014-02-17: 10 [IU] via SUBCUTANEOUS
  Administered 2014-02-17 – 2014-02-19 (×5): 2 [IU] via SUBCUTANEOUS

## 2014-02-12 MED ORDER — ACETAMINOPHEN 325 MG PO TABS
650.0000 mg | ORAL_TABLET | Freq: Four times a day (QID) | ORAL | Status: DC | PRN
  Administered 2014-02-13 – 2014-02-19 (×3): 650 mg via ORAL
  Filled 2014-02-12 (×3): qty 2

## 2014-02-12 NOTE — ED Notes (Signed)
Patient being transferred to 314. Report given to [facility]

## 2014-02-12 NOTE — Consult Note (Signed)
Referring Provider: Dr. Roderic Palau   Primary Care Physician:  Purvis Kilts, MD Primary Gastroenterologist:  Dr. Gala Romney   Date of Admission: 02/12/14 Date of Consultation: 02/12/14  Reason for Consultation:  GI bleed  HPI:  John [name] is a pleasant 80 year old male with a history of upper GI bleed, IDA in April 2012. EGD, colonoscopy, capsule study performed at that time. Noted to have gastric AVMs s/p bleeding control. Blood tinged in distal small bowel without obvious lesion on capsule study. Colonoscopy with blood colonic effluent throughout colon but distal TI normal. Tagged RBC study negative.   Presented with intermittent right-sided chest pain. Feels like somebody has "beat the hell out of me" for the past few days. Fatigued, weak. Stool looks like he has been eating oreo cookies. "Plumb black" and loose. Right sided vague abdominal discomfort and right-sided chest discomfort. Pain intermittent. Worsened with deep breath. Chronic shortness of breath. Eating/drinking causes nausea. Prescribed pills for GERD after recent admission Aug 3. At that time had negative cardiac markers, EKG without significant findings. Protonix started with some improvement. States helped "some but not much". No NSAIDs or aspirin powders. No dysphagia.Occasional heartburn. Chest discomfort, GERD worsening since at least early August.     Past Medical History  Diagnosis Date  . GI bleed     2006, TCS/TI showed small polyp. EGD showed erosive antral gastritis with two polyp, one oozing and tx. HP neg. SB capsule shoed few jejunal ulcers with bleeding (naproxen and ASA)  . GI bleed     02/2010, EGD->pancreatitc rest, fundal gland polyps,  no bleeding. TCS->blood-tinged colonic effluent throughout the colon without bleeding lesion found, nl TI. SB capsule->SB erosions, nonbleeding, incomplete study. Patient on naproxyn.   . DM (diabetes mellitus)   . Pneumonia   . Hyperlipidemia   . HTN (hypertension)    . Stroke   . GERD (gastroesophageal reflux disease)   . Poor vision     left eye  . Nephrolithiasis   . Sleep apnea   . Obesity   . Ischemic colitis, enteritis, or enterocolitis     flex sig 01/2010  . Chronic renal insufficiency   . Gastric AVM 10/24/10    GI bleed EGD w/ bicap and hemoclip placement, 2 AVMs, presented w/bright red rectal bleeding  . GI bleed 10/27/10    ileocolonoscopy/GIVENS capsule study by Dr Rani Idler->bloody colonic effluent throughout colon but no lesion identified, TA @ hepatic flexure, fresh blood in dital SB on capsule. Nuc med RBC study negative  . Tubular adenoma of colon 10/27/10    on colonoscopy Dr Gala Romney    Past Surgical History  Procedure Laterality Date  . Back surgery    . Transurethral resection of prostate    . Cataract extraction      bilateral  . Appendectomy    . Cholecystectomy    . Elbow surgery    . Esophagogastroduodenoscopy  10/23/10    Dr. Tora Kindred arteriovenous malformations,GI bleed- capsule  . Colonoscopy  10/27/10    Dr. Chauncy Lean adenoma, normal rectum bloody colonic effluent throughout the colon with out bleeding lesion seen.     Prior to Admission medications   Medication Sig Start Date End Date Taking? Authorizing Provider  amLODipine (NORVASC) 5 MG tablet Take 7.5 mg by mouth daily.   Yes Historical Provider, MD  Cholecalciferol (VITAMIN D) 2000 UNITS CAPS Take 1 capsule by mouth daily.   Yes Historical Provider, MD  Choline Fenofibrate (TRILIPIX) 135 MG capsule Take  135 mg by mouth at bedtime.    Yes Historical Provider, MD  ciprofloxacin (CIPRO) 500 MG tablet Take 500 mg by mouth daily.   Yes Historical Provider, MD  ferrous sulfate 325 (65 FE) MG tablet Take 325 mg by mouth daily.    Yes Historical Provider, MD  furosemide (LASIX) 40 MG tablet Take 20-40 mg by mouth 2 (two) times daily. Patient takes 1 tablet in the morning and 1/2 at night   Yes Historical Provider, MD  insulin lispro protamine-lispro (HUMALOG  75/25 MIX) (75-25) 100 UNIT/ML SUSP injection Inject 30 Units into the skin 2 (two) times daily with a meal. 01/29/14  Yes Kathie Dike, MD  losartan (COZAAR) 50 MG tablet Take 1 tablet by mouth daily. 11/23/13  Yes Historical Provider, MD  nebivolol (BYSTOLIC) 10 MG tablet Take 10 mg by mouth every morning.     Yes Historical Provider, MD  pantoprazole (PROTONIX) 40 MG tablet Take 40 mg by mouth daily.    Yes Historical Provider, MD  potassium chloride (KLOR-CON M10) 10 MEQ tablet Take 1 tablet (10 mEq total) by mouth daily. 12/19/13  Yes Troy Sine, MD  tamsulosin (FLOMAX) 0.4 MG CAPS capsule Take 0.4 mg by mouth daily.   Yes Historical Provider, MD  terazosin (HYTRIN) 10 MG capsule Take 10 mg by mouth at bedtime.   Yes Historical Provider, MD    No current facility-administered medications for this encounter.    Allergies as of 02/12/2014 - Review Complete 02/12/2014  Allergen Reaction Noted  . Latex Other (See Comments)   . Propoxyphene  01/27/2014  . Sulfa antibiotics Other (See Comments) 01/27/2014  . Betadine [povidone iodine] Rash 02/13/2011    Family History  Problem Relation Age of Onset  . Aneurysm Daughter     brain, deceased age 38  . GI problems Brother     gi bleed  . Cancer Sister     unknown type  . Colon cancer Neg Hx   . Liver disease Neg Hx     History   Social History  . Marital Status: Widowed    Spouse Name: N/A    Number of Children: 2  . Years of Education: N/A   Occupational History  . retired     [employer]   Social History Main Topics  . Smoking status: Never Smoker   . Smokeless tobacco: Never Used  . Alcohol Use: No  . Drug Use: No  . Sexual Activity: No   Other Topics Concern  . Not on file   Social History Narrative  . No narrative on file    Review of Systems: Negative unless mentioned in HPI.   Physical Exam: Vital signs in last 24 hours: Temp:  [98.4 F (36.9 C)-98.6 F (37 C)] 98.4 F (36.9 C) (08/17 1438) Pulse  Rate:  [82-94] 85 (08/17 1438) Resp:  [16-19] 19 (08/17 1438) BP: (124-136)/(39-117) 127/39 mmHg (08/17 1438) SpO2:  [94 %-100 %] 97 % (08/17 1438) Weight:  [233 lb (105.688 kg)] 233 lb (105.688 kg) (08/17 1438) Last BM Date: 02/11/14 General:   Alert,  Well-developed, well-nourished, pleasant and cooperative in NAD Head:  Normocephalic and atraumatic. Eyes:  Sclera clear, no icterus.    Ears:  Mild HOH Nose:  No deformity, discharge,  or lesions. Mouth:  No deformity or lesions Lungs:  Clear throughout to auscultation.  Heart:  S1 S2 present without murmurs appreciated.  Abdomen:  Soft, TTP upper abdomen and nondistended. Obese. Difficult to appreciate HSM due  to large AP diameter.  Rectal:  Deferred  Extremities:  Without edema. Neurologic:  Alert and  oriented x4;  grossly normal neurologically. Skin:  Intact without significant lesions or rashes. Psych:  Alert and cooperative. Normal mood and affect.  Intake/Output from previous day:   Intake/Output this shift:    Lab Results:  Recent Labs  02/12/14 1118  WBC 19.3*  HGB 10.3*  HCT 32.5*  PLT 333   BMET  Recent Labs  02/12/14 1118  NA 144  K 4.5  CL 106  CO2 21  GLUCOSE 163*  BUN 80*  CREATININE 4.40*  CALCIUM 9.2    Studies/Results: Dg Chest 2 View  02/12/2014   CLINICAL DATA:  Chest pain.  Unable to stand.  EXAM: CHEST  2 VIEW  COMPARISON:  01/27/2014.  FINDINGS: The heart is enlarged. There are no infiltrates or failure. No effusion or pneumothorax. Thoracic atherosclerosis. Similar appearance to priors.  IMPRESSION: No active cardiopulmonary disease.   Electronically Signed   By: Rolla Flatten M.D.   On: 02/12/2014 12:20    Impression: 80 year old male admitted with melena, heme positive stool, and about 2 gram drop in Hgb from several weeks ago. History significant for upper GI bleed in the setting of NSAIDs most recently in 2012. Last EGD with gastric AVMs. Capsule without obvious lesions in 2012.  Currently, no NSAIDs or aspirin powders taken per patient since around 2012. Right-sided chest discomfort and abdominal pain appear to improve slightly with PPI after recent discharge in early August. Ultimately needs EGD for further evaluation as a starting point.   Plan: NPO after midnight PPI BID Serial H/H EGD with Dr. Gala Romney on 8/18. Risks and benefits discussed in detail with stated understanding. Consider capsule +/- colonoscopy if no convincing etiology for acute blood loss anemia.    Orvil Feil, ANP-BC [facility]     LOS: 0 days    02/12/2014, 3:55 PM

## 2014-02-12 NOTE — H&P (Addendum)
[facility] History and Physical  John [name] PFX:[id number] DOB: Jul 05, 1930 DOA: 02/12/2014  Referring physician: Dr. Dina Rich, ER physician PCP: Purvis Kilts, MD   Chief Complaint: weakness  HPI: John [name] is a 80 y.o. male history of chronic kidney disease stage III, GERD, history of GI bleeding in the past related to gastric AVMs, was recently admitted to the hospital for chest pain which was felt to be related to GERD. His symptoms improved with Protonix GI cocktail he was discharged home with a prescription for protonix. He reports that for the last 3-4 days he's been having increased dark colored stools and has been feeling generally weak. Shortness of breath on exertion. Describes discomfort/pain in his right chest, right abdomen and periumbilical area. He's not had any vomiting. Denies any nonsteroidal use. Denies any fever, cough. He has noticed decreased urine output. He was evaluated in the emergency room for his son had heme positive stools, acute on chronic renal failure with elevated creatinine and BUN, and a mildly decreased hemoglobin. He'll be admitted to the hospital for further treatment   Review of Systems:  Pertinent positives as per HPI, otherwise negative   Past Medical History  Diagnosis Date  . GI bleed     2006, TCS/TI showed small polyp. EGD showed erosive antral gastritis with two polyp, one oozing and tx. HP neg. SB capsule shoed few jejunal ulcers with bleeding (naproxen and ASA)  . GI bleed     02/2010, EGD->pancreatitc rest, fundal gland polyps,  no bleeding. TCS->blood-tinged colonic effluent throughout the colon without bleeding lesion found, nl TI. SB capsule->SB erosions, nonbleeding, incomplete study. Patient on naproxyn.   . DM (diabetes mellitus)   . Pneumonia   . Hyperlipidemia   . HTN (hypertension)   . Stroke   . GERD (gastroesophageal reflux disease)   . Poor vision     left eye  . Nephrolithiasis   . Sleep apnea   .  Obesity   . Ischemic colitis, enteritis, or enterocolitis     flex sig 01/2010  . Chronic renal insufficiency   . Gastric AVM 10/24/10    GI bleed EGD w/ bicap and hemoclip placement, 2 AVMs, presented w/bright red rectal bleeding  . GI bleed 10/27/10    ileocolonoscopy/GIVENS capsule study by Dr Rourk->bloody colonic effluent throughout colon but no lesion identified, TA @ hepatic flexure, fresh blood in dital SB on capsule. Nuc med RBC study negative  . Tubular adenoma of colon 10/27/10    on colonoscopy Dr Gala Romney   Past Surgical History  Procedure Laterality Date  . Back surgery    . Transurethral resection of prostate    . Cataract extraction      bilateral  . Appendectomy    . Cholecystectomy    . Elbow surgery    . Esophagogastroduodenoscopy  10/23/10    Dr. Tora Kindred arteriovenous malformations,GI bleed- capsule  . Colonoscopy  10/27/10    Dr. Chauncy Lean adenoma, normal rectum bloody colonic effluent throughout the colon with out bleeding lesion seen.    Social History:  reports that he has never smoked. He has never used smokeless tobacco. He reports that he does not drink alcohol or use illicit drugs.  Allergies  Allergen Reactions  . Latex Other (See Comments)    unknown  . Propoxyphene     Other reaction(s): Dizziness  . Sulfa Antibiotics Other (See Comments)    unknown  . Betadine [Povidone Iodine] Rash    Family History  Problem  Relation Age of Onset  . Aneurysm Daughter     brain, deceased age 55  . GI problems Brother     gi bleed  . Cancer Sister     unknown type  . Colon cancer Neg Hx   . Liver disease Neg Hx      Prior to Admission medications   Medication Sig Start Date End Date Taking? Authorizing Provider  amLODipine (NORVASC) 5 MG tablet Take 7.5 mg by mouth daily.   Yes Historical Provider, MD  Cholecalciferol (VITAMIN D) 2000 UNITS CAPS Take 1 capsule by mouth daily.   Yes Historical Provider, MD  Choline Fenofibrate (TRILIPIX) 135 MG  capsule Take 135 mg by mouth at bedtime.    Yes Historical Provider, MD  ciprofloxacin (CIPRO) 500 MG tablet Take 500 mg by mouth daily.   Yes Historical Provider, MD  ferrous sulfate 325 (65 FE) MG tablet Take 325 mg by mouth daily.    Yes Historical Provider, MD  furosemide (LASIX) 40 MG tablet Take 20-40 mg by mouth 2 (two) times daily. Patient takes 1 tablet in the morning and 1/2 at night   Yes Historical Provider, MD  insulin lispro protamine-lispro (HUMALOG 75/25 MIX) (75-25) 100 UNIT/ML SUSP injection Inject 30 Units into the skin 2 (two) times daily with a meal. 01/29/14  Yes Kathie Dike, MD  losartan (COZAAR) 50 MG tablet Take 1 tablet by mouth daily. 11/23/13  Yes Historical Provider, MD  nebivolol (BYSTOLIC) 10 MG tablet Take 10 mg by mouth every morning.     Yes Historical Provider, MD  pantoprazole (PROTONIX) 40 MG tablet Take 40 mg by mouth daily.    Yes Historical Provider, MD  potassium chloride (KLOR-CON M10) 10 MEQ tablet Take 1 tablet (10 mEq total) by mouth daily. 12/19/13  Yes Troy Sine, MD  tamsulosin (FLOMAX) 0.4 MG CAPS capsule Take 0.4 mg by mouth daily.   Yes Historical Provider, MD  terazosin (HYTRIN) 10 MG capsule Take 10 mg by mouth at bedtime.   Yes Historical Provider, MD   Physical Exam: Filed Vitals:   02/12/14 1130 02/12/14 1230 02/12/14 1300 02/12/14 1438  BP:  127/43 136/117 127/39  Pulse: 84 87 94 85  Temp:    98.4 F (36.9 C)  TempSrc:    Oral  Resp:    19  Height:    5\' 7"  (1.702 m)  Weight:    105.688 kg (233 lb)  SpO2: 100% 94% 95% 97%    Wt Readings from Last 3 Encounters:  02/12/14 105.688 kg (233 lb)  01/27/14 111.313 kg (245 lb 6.4 oz)  07/10/13 112.038 kg (247 lb)    General:  Appears calm and comfortable Eyes: PERRL, normal lids, irises & conjunctiva ENT: grossly normal hearing, lips & tongue Neck: no LAD, masses or thyromegaly Cardiovascular: RRR, no m/r/g. No LE edema. Telemetry: SR, no arrhythmias  Respiratory: CTA  bilaterally, no w/r/r. Normal respiratory effort. Abdomen: soft, obese, tender around periumbilical area, bs+ Skin: no rash or induration seen on limited exam Musculoskeletal: grossly normal tone BUE/BLE Psychiatric: grossly normal mood and affect, speech fluent and appropriate Neurologic: grossly non-focal.          Labs on Admission:  Basic Metabolic Panel:  Recent Labs Lab 02/12/14 1118  NA 144  K 4.5  CL 106  CO2 21  GLUCOSE 163*  BUN 80*  CREATININE 4.40*  CALCIUM 9.2   Liver Function Tests: No results found for this basename: AST, ALT, ALKPHOS, BILITOT, PROT,  ALBUMIN,  in the last 168 hours No results found for this basename: LIPASE, AMYLASE,  in the last 168 hours No results found for this basename: AMMONIA,  in the last 168 hours CBC:  Recent Labs Lab 02/12/14 1118  WBC 19.3*  NEUTROABS 17.1*  HGB 10.3*  HCT 32.5*  MCV 90.8  PLT 333   Cardiac Enzymes:  Recent Labs Lab 02/12/14 1118  TROPONINI <0.30    BNP (last 3 results)  Recent Labs  07/10/13 1415 01/27/14 1923 02/12/14 1118  PROBNP 996.8* 2210.0* 507.3*   CBG: No results found for this basename: GLUCAP,  in the last 168 hours  Radiological Exams on Admission: Dg Chest 2 View  02/12/2014   CLINICAL DATA:  Chest pain.  Unable to stand.  EXAM: CHEST  2 VIEW  COMPARISON:  01/27/2014.  FINDINGS: The heart is enlarged. There are no infiltrates or failure. No effusion or pneumothorax. Thoracic atherosclerosis. Similar appearance to priors.  IMPRESSION: No active cardiopulmonary disease.   Electronically Signed   By: Rolla Flatten M.D.   On: 02/12/2014 12:20    EKG: Independently reviewed. Unchanged from prior ekg  Assessment/Plan Active Problems:   ANEMIA   HYPERTENSION   GERD   Melena   Weakness generalized   DM type 2 (diabetes mellitus, type 2)   Chest pain   Acute renal failure superimposed on stage 4 chronic kidney disease   GI bleed   1. GI bleeding. Per history it appears to be  upper GI bleed. He will started on twice a day Protonix. He'll be n.p.o. after midnight. Gastroenterology has been consulted and will plan on EGD in the morning. We'll cycle CBC. 2. Anemia. Likely acute blood loss anemia superimposed on anemia of chronic disease. Currently his hemoglobin is 10.3. I suspect he will have a significant hemoconcentration and his hemoglobin should drop with hydration. We'll follow closely and transfuse as necessary 3. Acute on chronic renal failure. Evaluate line placement dehydration. His upper GI bleeding is likely contributed to his elevated BUN. We'll start IV fluids and recheck renal function in the morning. 4. Hypertension. Discontinue losartan in light of chronic kidney disease 5. Diabetes. The patient takes insulin at home. He'll be prescribed half his home dose while he is on clear liquids and n.p.o.  6. Leukocytosis. He does not appear to be septic or toxic. Suspect this is related to hemoconcentration. We'll recheck in a.m.  Code Status: limited code: no intubation DVT Prophylaxis:scd Family Communication:  Disposition Plan: discharge home once improved  Time spent: 17mins  Merriam Brandner [facility] Pager [phone]  **Disclaimer: This note may have been dictated with voice recognition software. Similar sounding words can inadvertently be transcribed and this note may contain transcription errors which may not have been corrected upon publication of note.**

## 2014-02-12 NOTE — ED Provider Notes (Signed)
CSN: [id number]     Arrival date & time 02/12/14  1107 History  This chart was scribed for Merryl Hacker, MD by Ludger Nutting, ED Scribe. This patient was seen in room APA06/APA06 and the patient's care was started 11:12 AM.    Chief Complaint  Patient presents with  . Chest Pain    The history is provided by the patient. No language interpreter was used.    HPI Comments: John [name] is a 80 y.o. male with past medical history of HTN, HLD, DM, GI bleed who presents to the Emergency Department complaining of intermittent right sided chest pain that began yesterday and general malaise. Patient reports pain has been on and off. The current episode started for 2 hours ago. He reports associated SOB but states he has this at baseline. He describes the pain as sharp, burning and currently rates his pain as 3/10. He reports a history of similar symptoms but denies history of MI. He states he has not eaten today nor has he taken any of his daily medications or ASA today. Pain is not associated with food. Patient denies any vomiting or diarrhea. He does report 2 days of dark stools.    Patient was admitted approximately 2 weeks ago for chest pain rule out. At that time his chest pain was felt to be related to GI symptoms and resolved with PPIs.  Past Medical History  Diagnosis Date  . GI bleed     2006, TCS/TI showed small polyp. EGD showed erosive antral gastritis with two polyp, one oozing and tx. HP neg. SB capsule shoed few jejunal ulcers with bleeding (naproxen and ASA)  . GI bleed     02/2010, EGD->pancreatitc rest, fundal gland polyps,  no bleeding. TCS->blood-tinged colonic effluent throughout the colon without bleeding lesion found, nl TI. SB capsule->SB erosions, nonbleeding, incomplete study. Patient on naproxyn.   . DM (diabetes mellitus)   . Pneumonia   . Hyperlipidemia   . HTN (hypertension)   . Stroke   . GERD (gastroesophageal reflux disease)   . Poor vision     left eye  .  Nephrolithiasis   . Sleep apnea   . Obesity   . Ischemic colitis, enteritis, or enterocolitis     flex sig 01/2010  . Chronic renal insufficiency   . Gastric AVM 10/24/10    GI bleed EGD w/ bicap and hemoclip placement, 2 AVMs, presented w/bright red rectal bleeding  . GI bleed 10/27/10    ileocolonoscopy/GIVENS capsule study by Dr Rourk->bloody colonic effluent throughout colon but no lesion identified, TA @ hepatic flexure, fresh blood in dital SB on capsule. Nuc med RBC study negative  . Tubular adenoma of colon 10/27/10    on colonoscopy Dr Gala Romney   Past Surgical History  Procedure Laterality Date  . Back surgery    . Transurethral resection of prostate    . Cataract extraction      bilateral  . Appendectomy    . Cholecystectomy    . Elbow surgery    . Esophagogastroduodenoscopy  10/23/10    Dr. Tora Kindred arteriovenous malformations,GI bleed- capsule  . Colonoscopy  10/27/10    Dr. Chauncy Lean adenoma, normal rectum bloody colonic effluent throughout the colon with out bleeding lesion seen.    Family History  Problem Relation Age of Onset  . Aneurysm Daughter     brain, deceased age 90  . GI problems Brother     gi bleed  . Cancer Sister  unknown type  . Colon cancer Neg Hx   . Liver disease Neg Hx    History  Substance Use Topics  . Smoking status: Never Smoker   . Smokeless tobacco: Never Used  . Alcohol Use: No    Review of Systems  Constitutional: Positive for fatigue. Negative for fever.  Respiratory: Positive for chest tightness and shortness of breath. Negative for cough.   Cardiovascular: Positive for chest pain. Negative for leg swelling.  Gastrointestinal: Negative.  Negative for nausea, vomiting, abdominal pain and rectal pain.       Dark stools  Genitourinary: Negative.  Negative for dysuria.  Musculoskeletal: Negative for back pain.  Neurological: Negative for headaches.  All other systems reviewed and are negative.     Allergies  Latex;  Propoxyphene; Sulfa antibiotics; and Betadine  Home Medications   Prior to Admission medications   Medication Sig Start Date End Date Taking? Authorizing Provider  amLODipine (NORVASC) 5 MG tablet Take 7.5 mg by mouth daily.   Yes Historical Provider, MD  Cholecalciferol (VITAMIN D) 2000 UNITS CAPS Take 1 capsule by mouth daily.   Yes Historical Provider, MD  Choline Fenofibrate (TRILIPIX) 135 MG capsule Take 135 mg by mouth at bedtime.    Yes Historical Provider, MD  ciprofloxacin (CIPRO) 500 MG tablet Take 500 mg by mouth daily.   Yes Historical Provider, MD  ferrous sulfate 325 (65 FE) MG tablet Take 325 mg by mouth daily.    Yes Historical Provider, MD  furosemide (LASIX) 40 MG tablet Take 20-40 mg by mouth 2 (two) times daily. Patient takes 1 tablet in the morning and 1/2 at night   Yes Historical Provider, MD  insulin lispro protamine-lispro (HUMALOG 75/25 MIX) (75-25) 100 UNIT/ML SUSP injection Inject 30 Units into the skin 2 (two) times daily with a meal. 01/29/14  Yes Kathie Dike, MD  losartan (COZAAR) 50 MG tablet Take 1 tablet by mouth daily. 11/23/13  Yes Historical Provider, MD  nebivolol (BYSTOLIC) 10 MG tablet Take 10 mg by mouth every morning.     Yes Historical Provider, MD  pantoprazole (PROTONIX) 40 MG tablet Take 40 mg by mouth daily.    Yes Historical Provider, MD  potassium chloride (KLOR-CON M10) 10 MEQ tablet Take 1 tablet (10 mEq total) by mouth daily. 12/19/13  Yes Troy Sine, MD  tamsulosin (FLOMAX) 0.4 MG CAPS capsule Take 0.4 mg by mouth daily.   Yes Historical Provider, MD  terazosin (HYTRIN) 10 MG capsule Take 10 mg by mouth at bedtime.   Yes Historical Provider, MD   BP 127/43  Pulse 87  Temp(Src) 98.6 F (37 C) (Oral)  Resp 16  Ht 5\' 7"  (1.702 m)  Wt 233 lb (105.688 kg)  BMI 36.48 kg/m2  SpO2 94% Physical Exam  Nursing note and vitals reviewed. Constitutional: He is oriented to person, place, and time.  Elderly, obese, no acute distress  HENT:   Head: Normocephalic and atraumatic.  Mouth/Throat: Oropharynx is clear and moist.  Eyes: Pupils are equal, round, and reactive to light.  Neck: Neck supple.  Cardiovascular: Normal rate, regular rhythm and normal heart sounds.   No murmur heard. Pulmonary/Chest: Effort normal and breath sounds normal. No respiratory distress. He has no wheezes. He exhibits no tenderness.  Abdominal: Soft. Bowel sounds are normal. There is tenderness. There is no rebound.  Mild tenderness to palpation of the epigastrium  Genitourinary: Rectum normal.  Dark stool noted just outside the rectum, heme positive  Musculoskeletal: He exhibits  no edema.  Trace bilateral lower extremity edema  Neurological: He is alert and oriented to person, place, and time.  Skin: Skin is warm and dry.  Multiple lesions consistent with keratosis over the face, chest, and back  Psychiatric: He has a normal mood and affect.    ED Course  Procedures (including critical care time)  DIAGNOSTIC STUDIES: Oxygen Saturation is 99% on 2 L/min, normal by my interpretation.    COORDINATION OF CARE: 11:19 AM Discussed treatment plan with pt at bedside and pt agreed to plan.   Labs Review Labs Reviewed  CBC WITH DIFFERENTIAL - Abnormal; Notable for the following:    WBC 19.3 (*)    RBC 3.58 (*)    Hemoglobin 10.3 (*)    HCT 32.5 (*)    Neutrophils Relative % 88 (*)    Neutro Abs 17.1 (*)    Lymphocytes Relative 6 (*)    Monocytes Absolute 1.1 (*)    All other components within normal limits  BASIC METABOLIC PANEL - Abnormal; Notable for the following:    Glucose, Bld 163 (*)    BUN 80 (*)    Creatinine, Ser 4.40 (*)    GFR calc non Af Amer 11 (*)    GFR calc Af Amer 13 (*)    Anion gap 17 (*)    All other components within normal limits  PRO B NATRIURETIC PEPTIDE - Abnormal; Notable for the following:    Pro B Natriuretic peptide (BNP) 507.3 (*)    All other components within normal limits  TROPONIN I  URINALYSIS,  ROUTINE W REFLEX MICROSCOPIC  POC OCCULT BLOOD, ED  TYPE AND SCREEN    Imaging Review Dg Chest 2 View  02/12/2014   CLINICAL DATA:  Chest pain.  Unable to stand.  EXAM: CHEST  2 VIEW  COMPARISON:  01/27/2014.  FINDINGS: The heart is enlarged. There are no infiltrates or failure. No effusion or pneumothorax. Thoracic atherosclerosis. Similar appearance to priors.  IMPRESSION: No active cardiopulmonary disease.   Electronically Signed   By: Rolla Flatten M.D.   On: 02/12/2014 12:20     EKG Interpretation   Date/Time:  Monday February 12 2014 11:02:52 EDT Ventricular Rate:  82 PR Interval:  72 QRS Duration: 161 QT Interval:  375 QTC Calculation: 438 R Axis:   80 Text Interpretation:  Sinus rhythm Short PR interval Right atrial  enlargement Right bundle branch block  Baseline wander Confirmed by Simmie Camerer   MD, Andreu Drudge ([id number]) on 02/12/2014 11:09:57 AM      MDM   Final diagnoses:  Chest pain, unspecified chest pain type  Melena  Acute on chronic kidney failure  Uremia   Patient presents with complaints of chest pain. Reports on and off pain was recently started at 9:00 this morning. He is nontoxic on exam. Vital signs are reassuring.  Basic labwork obtained. Patient given dose aspirin and a GI cocktail for his chest pain. No known history of coronary artery disease. EKG is unchanged. Initial troponin is negative.  Chest x-ray is reassuring. Patient does have heme positive dark stool. Given this and history of chest pain related to GI symptoms, will also give IV Protonix.  Patient reports improvement of chest pain on evaluation. Labs were notable for a hemoglobin of 10.3 which is slightly declined from a baseline of 11.5.  Patient also has acute elevation in creatinine from his baseline of 2.8 to 4.4 and a BUN of 80.  Patient's initial cardiac evaluation is reassuring. He  has evidence of an acute GI bleed although he is hemodynamically stable. He also has evidence of acute on chronic kidney  dysfunction with acute uremia.  Given this, will admit patient for further management. Urinalysis and type and screen are pending.  Discussed patient with Dr. Gala Romney who is his GI Dr. He will see him in consultation.  Plan admission to the hospital.  I personally performed the services described in this documentation, which was scribed in my presence. The recorded information has been reviewed and is accurate.   Merryl Hacker, MD 02/12/14 [phone]

## 2014-02-12 NOTE — ED Notes (Signed)
Patient states that he has been having chest pain on and off for 1 week. Patient states that pain started today on right side of chest at 9am and has not stopped. States feels like a burning. Patient states been having black stools x 2 days.

## 2014-02-13 ENCOUNTER — Encounter (HOSPITAL_COMMUNITY)
Admission: EM | Disposition: A | Discharge status: Home Health | Payer: Self-pay | Source: Home / Self Care | Attending: Family Medicine

## 2014-02-13 ENCOUNTER — Encounter (HOSPITAL_COMMUNITY): Payer: Self-pay | Admitting: *Deleted

## 2014-02-13 DIAGNOSIS — N184 Chronic kidney disease, stage 4 (severe): Secondary | ICD-10-CM

## 2014-02-13 DIAGNOSIS — K253 Acute gastric ulcer without hemorrhage or perforation: Secondary | ICD-10-CM

## 2014-02-13 HISTORY — PX: ESOPHAGOGASTRODUODENOSCOPY: SHX5428

## 2014-02-13 LAB — CBC
HCT: 28.6 % — ABNORMAL LOW (ref 39.0–52.0)
HCT: 25 % — ABNORMAL LOW (ref 39.0–52.0)
HEMATOCRIT: 27.3 % — AB (ref 39.0–52.0)
HEMOGLOBIN: 8.6 g/dL — AB (ref 13.0–17.0)
HEMOGLOBIN: 8.8 g/dL — AB (ref 13.0–17.0)
HEMOGLOBIN: 8.9 g/dL — AB (ref 13.0–17.0)
MCH: 28.2 pg (ref 26.0–34.0)
MCH: 29 pg (ref 26.0–34.0)
MCH: 31.5 pg (ref 26.0–34.0)
MCHC: 31.1 g/dL (ref 30.0–36.0)
MCHC: 32.2 g/dL (ref 30.0–36.0)
MCHC: 34.4 g/dL (ref 30.0–36.0)
MCV: 90.1 fL (ref 78.0–100.0)
MCV: 90.5 fL (ref 78.0–100.0)
MCV: 91.6 fL (ref 78.0–100.0)
PLATELETS: 251 10*3/uL (ref 150–400)
Platelets: 248 10*3/uL (ref 150–400)
Platelets: 263 10*3/uL (ref 150–400)
RBC: 2.73 MIL/uL — AB (ref 4.22–5.81)
RBC: 3.03 MIL/uL — ABNORMAL LOW (ref 4.22–5.81)
RBC: 3.16 MIL/uL — AB (ref 4.22–5.81)
RDW: 14.9 % (ref 11.5–15.5)
RDW: 15.1 % (ref 11.5–15.5)
RDW: 15.2 % (ref 11.5–15.5)
WBC: 13.9 10*3/uL — AB (ref 4.0–10.5)
WBC: 9.4 10*3/uL (ref 4.0–10.5)
WBC: 10.9 10*3/uL — AB (ref 4.0–10.5)

## 2014-02-13 LAB — BASIC METABOLIC PANEL
ANION GAP: 14 (ref 5–15)
ANION GAP: 15 (ref 5–15)
BUN: 74 mg/dL — ABNORMAL HIGH (ref 6–23)
BUN: 75 mg/dL — ABNORMAL HIGH (ref 6–23)
CHLORIDE: 109 meq/L (ref 96–112)
CHLORIDE: 107 meq/L (ref 96–112)
CO2: 20 mEq/L (ref 19–32)
CO2: 20 meq/L (ref 19–32)
CREATININE: 4.34 mg/dL — AB (ref 0.50–1.35)
Calcium: 8.6 mg/dL (ref 8.4–10.5)
Calcium: 8.8 mg/dL (ref 8.4–10.5)
Creatinine, Ser: 4.19 mg/dL — ABNORMAL HIGH (ref 0.50–1.35)
GFR calc Af Amer: 14 mL/min — ABNORMAL LOW (ref >90)
GFR calc non Af Amer: 12 mL/min — ABNORMAL LOW (ref >90)
GFR calc non Af Amer: 12 mL/min — ABNORMAL LOW (ref >90)
GFR, EST AFRICAN AMERICAN: 13 mL/min — AB (ref >90)
GLUCOSE: 138 mg/dL — AB (ref 70–99)
Glucose, Bld: 136 mg/dL — ABNORMAL HIGH (ref 70–99)
POTASSIUM: 4.2 meq/L (ref 3.7–5.3)
POTASSIUM: 4.2 meq/L (ref 3.7–5.3)
SODIUM: 142 meq/L (ref 137–147)
Sodium: 143 mEq/L (ref 137–147)

## 2014-02-13 LAB — GLUCOSE, CAPILLARY
GLUCOSE-CAPILLARY: 113 mg/dL — AB (ref 70–99)
GLUCOSE-CAPILLARY: 127 mg/dL — AB (ref 70–99)
GLUCOSE-CAPILLARY: 165 mg/dL — AB (ref 70–99)
Glucose-Capillary: 121 mg/dL — ABNORMAL HIGH (ref 70–99)

## 2014-02-13 LAB — HEMOGLOBIN A1C
HEMOGLOBIN A1C: 7.3 % — AB (ref <5.7)
MEAN PLASMA GLUCOSE: 163 mg/dL — AB (ref <117)

## 2014-02-13 SURGERY — EGD (ESOPHAGOGASTRODUODENOSCOPY)
Anesthesia: Moderate Sedation

## 2014-02-13 MED ORDER — MIDAZOLAM HCL 5 MG/5ML IJ SOLN
INTRAMUSCULAR | Status: DC | PRN
  Administered 2014-02-13: 2 mg via INTRAVENOUS
  Administered 2014-02-13: 1 mg via INTRAVENOUS

## 2014-02-13 MED ORDER — ONDANSETRON HCL 4 MG/2ML IJ SOLN
INTRAMUSCULAR | Status: DC | PRN
  Administered 2014-02-13: 4 mg via INTRAVENOUS

## 2014-02-13 MED ORDER — MEPERIDINE HCL 100 MG/ML IJ SOLN
INTRAMUSCULAR | Status: AC
  Filled 2014-02-13: qty 2

## 2014-02-13 MED ORDER — ONDANSETRON HCL 4 MG/2ML IJ SOLN
INTRAMUSCULAR | Status: AC
Start: 2014-02-13 — End: 2014-02-14
  Filled 2014-02-13: qty 2

## 2014-02-13 MED ORDER — METOCLOPRAMIDE HCL 5 MG/ML IJ SOLN
10.0000 mg | INTRAMUSCULAR | Status: AC
  Administered 2014-02-14: 10 mg via INTRAVENOUS
  Filled 2014-02-13: qty 2

## 2014-02-13 MED ORDER — MIDAZOLAM HCL 5 MG/5ML IJ SOLN
INTRAMUSCULAR | Status: AC
  Filled 2014-02-13: qty 10

## 2014-02-13 MED ORDER — CETYLPYRIDINIUM CHLORIDE 0.05 % MT LIQD
7.0000 mL | Freq: Two times a day (BID) | OROMUCOSAL | Status: DC
  Administered 2014-02-13 – 2014-02-19 (×12): 7 mL via OROMUCOSAL

## 2014-02-13 MED ORDER — LIDOCAINE VISCOUS 2 % MT SOLN
OROMUCOSAL | Status: DC | PRN
  Administered 2014-02-13: 3 mL via OROMUCOSAL

## 2014-02-13 MED ORDER — STERILE WATER FOR IRRIGATION IR SOLN
Status: DC | PRN
  Administered 2014-02-13: 16:00:00

## 2014-02-13 MED ORDER — MEPERIDINE HCL 100 MG/ML IJ SOLN
INTRAMUSCULAR | Status: DC | PRN
  Administered 2014-02-13: 25 mg via INTRAVENOUS
  Administered 2014-02-13: 50 mg via INTRAVENOUS

## 2014-02-13 MED ORDER — LIDOCAINE VISCOUS 2 % MT SOLN
OROMUCOSAL | Status: AC
  Filled 2014-02-13: qty 15

## 2014-02-13 MED ORDER — METOCLOPRAMIDE HCL 5 MG/ML IJ SOLN: 10.0000 mg | INTRAMUSCULAR | Status: DC

## 2014-02-13 NOTE — Progress Notes (Signed)
UR chart review completed.  

## 2014-02-13 NOTE — Care Management Note (Addendum)
    Page 1 of 2   02/19/2014     11:23:44 AM CARE MANAGEMENT NOTE 02/19/2014  Patient:  John [name], John [name]   Account Number:  [id number]  Date Initiated:  02/13/2014  Documentation initiated by:  Theophilus Kinds  Subjective/Objective Assessment:   Pt admitted from home with anemia and gi bleeding. Pt lives alone and will return home at discharge. Pt is active with [facility] and [facility] RN and PT. Pt has a cane and walker for home use.     Action/Plan:   CM will arrange resumption of HH at discharge.   Anticipated DC Date:  02/15/2014   Anticipated DC Plan:  [place]  CM consult      [facility] Choice  Resumption Of Svcs/PTA Provider   Choice offered to / List presented to:  C-1 Patient        [place] arranged  HH-1 RN  [place].   Status of service:  Completed, signed off Medicare Important Message given?  YES (If response is "NO", the following Medicare IM given date fields will be blank) Date Medicare IM given:  02/16/2014 Medicare IM given by:  Theophilus Kinds Date Additional Medicare IM given:  02/19/2014 Additional Medicare IM given by:  Theophilus Kinds  Discharge Disposition:  [place]  Per UR Regulation:    If discussed at Long Length of Stay Meetings, dates discussed:    Comments:  02/19/14 [place], RN BSN CM Pt discharged home today with resumption of [facility] RN and PT (per pts choice). Romualdo Bolk of [facility] is aware and will collect the pts information from the chart. [place] services to resume within 48 hours of discharge. No DME needs noted. Pt does not qualify for home O2. Pt and pts nurse aware of discharge arrangements.  02/16/14 [place], RN BSN CM Pt potential discharge over the weekend. Pt for colonoscopy today. Cm has arranged resumption of RN and PT with [facility] (per pts choice). Romualdo Bolk of [facility] is aware and will collect the pts information from the  chart. [place] services to resume within 48 hours of discharge. No DME needs noted. [facility] will resume services as well after discharge. No other CM needs noted. Pt and pts nurse aware of discharge arrangements.  02/13/14 [place], RN BSN CM

## 2014-02-13 NOTE — Discharge Instructions (Signed)
EGD Discharge instructions Please read the instructions outlined below and refer to this sheet in the next few weeks. These discharge instructions provide you with general information on caring for yourself after you leave the hospital. Your doctor may also give you specific instructions. While your treatment has been planned according to the most current medical practices available, unavoidable complications occasionally occur. If you have any problems or questions after discharge, please call your doctor. ACTIVITY You may resume your regular activity but move at a slower pace for the next 24 hours.  Take frequent rest periods for the next 24 hours.  Walking will help expel (get rid of) the air and reduce the bloated feeling in your abdomen.  No driving for 24 hours (because of the anesthesia (medicine) used during the test).  You may shower.  Do not sign any important legal documents or operate any machinery for 24 hours (because of the anesthesia used during the test).  NUTRITION Drink plenty of fluids.  You may resume your normal diet.  Begin with a light meal and progress to your normal diet.  Avoid alcoholic beverages for 24 hours or as instructed by your caregiver.  MEDICATIONS You may resume your normal medications unless your caregiver tells you otherwise.  WHAT YOU CAN EXPECT TODAY You may experience abdominal discomfort such as a feeling of fullness or "gas" pains.  FOLLOW-UP Your doctor will discuss the results of your test with you.  SEEK IMMEDIATE MEDICAL ATTENTION IF ANY OF THE FOLLOWING OCCUR: Excessive nausea (feeling sick to your stomach) and/or vomiting.  Severe abdominal pain and distention (swelling).  Trouble swallowing.  Temperature over 101 F (37.8 C).  Rectal bleeding or vomiting of blood.      

## 2014-02-13 NOTE — Progress Notes (Signed)
[facility] PROGRESS NOTE  John [name] IFO:[id number] DOB: 1930/10/07 DOA: 02/12/2014 PCP: Purvis Kilts, MD  Assessment/Plan: 1. GI bleeding. Appears to be upper GI bleeding. Plans are for EGD later today. His current n.p.o. Hemoglobin has been stable overnight, although lower than admission. He is on proton pump inhibitors. 2. Anemia, acute blood loss anemia superimposed on anemia of chronic disease. We'll continue to follow hemoglobin. Transfuse for hemoglobin less than 8. Currently hemoglobin appears to be stable. 3. Acute on chronic renal failure. Likely related to dehydration. Upper GI bleeding is likely contributing to elevated BUN. Recheck labs this morning. 4. Diabetes. Restart insulin once his procedure has been completed. 5. Hypertension. Discontinue losartan in light of chronic kidney disease. 6. Leukocytosis. Likely related to hemoconcentration and dehydration. Improved with IV fluids. No signs of sepsis.  Code Status: limited code Family Communication: discussed with patient Disposition Plan: discharge home once improved   Consultants:  gastroenterology  Procedures:    Antibiotics:    HPI/Subjective: Feeling better. No new complaints  Objective: Filed Vitals:   02/13/14 0500  BP: 120/41  Pulse: 68  Temp: 97.9 F (36.6 C)  Resp: 18    Intake/Output Summary (Last 24 hours) at 02/13/14 1032 Last data filed at 02/12/14 2100  Gross per 24 hour  Intake    720 ml  Output   1000 ml  Net   -280 ml   Filed Weights   02/12/14 1059 02/12/14 1438 02/13/14 0500  Weight: 105.688 kg (233 lb) 105.688 kg (233 lb) 107.4 kg (236 lb 12.4 oz)    Exam:   General:  NAD  Cardiovascular: S1, s2 rrr  Respiratory: cta b  Abdomen: soft, tender in periumbilical area, nd, bs+  Musculoskeletal:  No edema b/l   Data Reviewed: Basic Metabolic Panel:  Recent Labs Lab 02/12/14 1118 02/12/14 2353  NA 144 142  K 4.5 4.2  CL 106 107  CO2 21 20   GLUCOSE 163* 138*  BUN 80* 75*  CREATININE 4.40* 4.19*  CALCIUM 9.2 8.6   Liver Function Tests: No results found for this basename: AST, ALT, ALKPHOS, BILITOT, PROT, ALBUMIN,  in the last 168 hours No results found for this basename: LIPASE, AMYLASE,  in the last 168 hours No results found for this basename: AMMONIA,  in the last 168 hours CBC:  Recent Labs Lab 02/12/14 1118 02/12/14 1638 02/12/14 2353 02/13/14 0827  WBC 19.3* 16.2* 13.9* 10.9*  NEUTROABS 17.1*  --   --   --   HGB 10.3* 9.3* 8.8* 8.9*  HCT 32.5* 29.6* 27.3* 28.6*  MCV 90.8 90.2 90.1 90.5  PLT 333 289 263 251   Cardiac Enzymes:  Recent Labs Lab 02/12/14 1118  TROPONINI <0.30   BNP (last 3 results)  Recent Labs  07/10/13 1415 01/27/14 1923 02/12/14 1118  PROBNP 996.8* 2210.0* 507.3*   CBG:  Recent Labs Lab 02/12/14 1647 02/12/14 2126 02/13/14 0753  GLUCAP 112* 143* 113*    No results found for this or any previous visit (from the past 240 hour(s)).   Studies: Dg Chest 2 View  02/12/2014   CLINICAL DATA:  Chest pain.  Unable to stand.  EXAM: CHEST  2 VIEW  COMPARISON:  01/27/2014.  FINDINGS: The heart is enlarged. There are no infiltrates or failure. No effusion or pneumothorax. Thoracic atherosclerosis. Similar appearance to priors.  IMPRESSION: No active cardiopulmonary disease.   Electronically Signed   By: Rolla Flatten M.D.   On: 02/12/2014 12:20  Scheduled Meds: . antiseptic oral rinse  7 mL Mouth Rinse BID  . insulin aspart  0-15 Units Subcutaneous TID WC  . insulin aspart  0-5 Units Subcutaneous QHS  . insulin aspart protamine- aspart  15 Units Subcutaneous BID WC  . ipratropium-albuterol  3 mL Nebulization TID  . nebivolol  10 mg Oral [id number]  . pantoprazole (PROTONIX) IV  40 mg Intravenous Q12H  . tamsulosin  0.4 mg Oral Daily  . terazosin  10 mg Oral QHS   Continuous Infusions: . sodium chloride 100 mL/hr at 02/13/14 0600    Active Problems:   ANEMIA   HYPERTENSION    GERD   Melena   Weakness generalized   DM type 2 (diabetes mellitus, type 2)   Chest pain   Acute renal failure superimposed on stage 4 chronic kidney disease   GI bleed    Time spent: 31mins    Camilah Spillman  [facility] Pager [phone]. If 7PM-7AM, please contact night-coverage at [email or web address], password [facility] 02/13/2014, 10:32 AM  LOS: 1 day

## 2014-02-13 NOTE — Progress Notes (Signed)
02/13/14 5188 Patient NPO for EGD today. CBG: 113 this am. Novolog 70/30 15 units SQ ordered for this morning. Notified Dr. Roderic Palau. Hold dose this morning per MD. Donavan Foil, RN

## 2014-02-13 NOTE — Op Note (Signed)
[facility] [address] [place], [address]   ENDOSCOPY PROCEDURE REPORT  PATIENT: Jayion, Schneck  MR#: [id number] BIRTHDATE: 1931/04/07 , 83  yrs. old GENDER: Male ENDOSCOPIST: R.  Garfield Cornea, MD FACP FACG REFERRED BY:  Sharilyn Sites, M.D. PROCEDURE DATE:  02/13/2014 PROCEDURE:     EGD with biopsy  INDICATIONS:     melena; declined hemoglobin  INFORMED CONSENT:   The risks, benefits, limitations, alternatives and imponderables have been discussed.  The potential for biopsy, esophogeal dilation, etc. have also been reviewed.  Questions have been answered.  All parties agreeable.  Please see the history and physical in the medical record for more information.  MEDICATIONS:    Versed 3 mg IV and Demerol 75 mg IV in divided doses. Zofran 4 mg IV. Xylocaine gel orally  DESCRIPTION OF PROCEDURE:   The [id number] ([id number])  endoscope was introduced through the mouth and advanced to the second portion of the duodenum without difficulty or limitations.  The mucosal surfaces were surveyed very carefully during advancement of the scope and upon withdrawal.  Retroflexion view of the proximal stomach and esophagogastric junction was performed.      FINDINGS: Normal esophagus. Scattered 2-3 mm benign-appearing polyps (previously biopsied and determinedto be benign fundal gland polyps).  (1) 5-6 mm submucosal lesion with some central "volcano-like"depression in the antrum consistent with a pancreatic rest 9 as previously seen). There were (2) 1-2 mm areas of erosions/ tiny ulceration in the antrum as well. These appear to be innocent lesions. Remainder of the gastric mucosa appeared normal. Patent pylorus. Normal first, second and third portion of the duodenum.  THERAPEUTIC / DIAGNOSTIC MANEUVERS PERFORMED:  2 small pinch biopsies of the antral erosion/ulceration taken.   COMPLICATIONS:  None  IMPRESSION:   Fundal gland polyps. Pancreatic rest. 2 tiny areas  of erosions/ulceration-not likely significant-status post biopsy  No satisfactory explanation for GI bleeding based on today's findings. Given prior findings and his clinical presentation of melena, drop in hemoglobin and acute bump in BUN / creatinine, I continue to suspect the more proximal GI tract but more likely a small intestine rather than the colon.  RECOMMENDATIONS:   Proceed with a small bowel capsule study tomorrow morning. Patient will be given a dose of Reglan just prior capsule ingestion to promote gastric emptying.  My findings and recommendations have been reviewed with the patient's brother, [name], and Dr. Roderic Palau.    _______________________________ R. Garfield Cornea, MD FACP [facility] eSigned:  R. Garfield Cornea, MD FACP [facility] 02/13/2014 4:43 PM     CC:  PATIENT NAME:  Regis, Hinton MR#: [id number]

## 2014-02-14 ENCOUNTER — Encounter (HOSPITAL_COMMUNITY)
Admission: EM | Disposition: A | Discharge status: Home Health | Payer: Self-pay | Source: Home / Self Care | Attending: Family Medicine

## 2014-02-14 ENCOUNTER — Encounter (HOSPITAL_COMMUNITY): Payer: Self-pay | Admitting: *Deleted

## 2014-02-14 DIAGNOSIS — E1129 Type 2 diabetes mellitus with other diabetic kidney complication: Secondary | ICD-10-CM

## 2014-02-14 DIAGNOSIS — K922 Gastrointestinal hemorrhage, unspecified: Secondary | ICD-10-CM

## 2014-02-14 HISTORY — PX: GIVENS CAPSULE STUDY: SHX5432

## 2014-02-14 LAB — BASIC METABOLIC PANEL
Anion gap: 12 (ref 5–15)
BUN: 63 mg/dL — ABNORMAL HIGH (ref 6–23)
CO2: 21 mEq/L (ref 19–32)
Calcium: 8.5 mg/dL (ref 8.4–10.5)
Chloride: 114 mEq/L — ABNORMAL HIGH (ref 96–112)
Creatinine, Ser: 3.67 mg/dL — ABNORMAL HIGH (ref 0.50–1.35)
GFR calc Af Amer: 16 mL/min — ABNORMAL LOW (ref >90)
GFR calc non Af Amer: 14 mL/min — ABNORMAL LOW (ref >90)
GLUCOSE: 143 mg/dL — AB (ref 70–99)
POTASSIUM: 4.4 meq/L (ref 3.7–5.3)
SODIUM: 147 meq/L (ref 137–147)

## 2014-02-14 LAB — GLUCOSE, CAPILLARY
GLUCOSE-CAPILLARY: 137 mg/dL — AB (ref 70–99)
GLUCOSE-CAPILLARY: 145 mg/dL — AB (ref 70–99)
GLUCOSE-CAPILLARY: 92 mg/dL (ref 70–99)
Glucose-Capillary: 141 mg/dL — ABNORMAL HIGH (ref 70–99)
Glucose-Capillary: 126 mg/dL — ABNORMAL HIGH (ref 70–99)

## 2014-02-14 LAB — CBC
HCT: 21.5 % — ABNORMAL LOW (ref 39.0–52.0)
HCT: 22.5 % — ABNORMAL LOW (ref 39.0–52.0)
HCT: 23.8 % — ABNORMAL LOW (ref 39.0–52.0)
Hemoglobin: 6.8 g/dL — CL (ref 13.0–17.0)
Hemoglobin: 7.1 g/dL — ABNORMAL LOW (ref 13.0–17.0)
Hemoglobin: 7.4 g/dL — ABNORMAL LOW (ref 13.0–17.0)
MCH: 28.1 pg (ref 26.0–34.0)
MCH: 28.5 pg (ref 26.0–34.0)
MCH: 28.8 pg (ref 26.0–34.0)
MCHC: 31.1 g/dL (ref 30.0–36.0)
MCHC: 31.6 g/dL (ref 30.0–36.0)
MCHC: 31.6 g/dL (ref 30.0–36.0)
MCV: 90.4 fL (ref 78.0–100.0)
MCV: 90.5 fL (ref 78.0–100.0)
MCV: 91.1 fL (ref 78.0–100.0)
PLATELETS: 234 10*3/uL (ref 150–400)
PLATELETS: 235 10*3/uL (ref 150–400)
Platelets: 257 10*3/uL (ref 150–400)
RBC: 2.36 MIL/uL — AB (ref 4.22–5.81)
RBC: 2.49 MIL/uL — ABNORMAL LOW (ref 4.22–5.81)
RBC: 2.63 MIL/uL — AB (ref 4.22–5.81)
RDW: 15 % (ref 11.5–15.5)
RDW: 15 % (ref 11.5–15.5)
RDW: 15.1 % (ref 11.5–15.5)
WBC: 8.6 10*3/uL (ref 4.0–10.5)
WBC: 9.4 10*3/uL (ref 4.0–10.5)
WBC: 6.6 10*3/uL (ref 4.0–10.5)

## 2014-02-14 LAB — PREPARE RBC (CROSSMATCH)

## 2014-02-14 SURGERY — IMAGING PROCEDURE, GI TRACT, INTRALUMINAL, VIA CAPSULE

## 2014-02-14 MED ORDER — PANTOPRAZOLE SODIUM 40 MG PO TBEC
40.0000 mg | DELAYED_RELEASE_TABLET | Freq: Two times a day (BID) | ORAL | Status: DC
  Administered 2014-02-14 – 2014-02-19 (×11): 40 mg via ORAL
  Filled 2014-02-14 (×11): qty 1

## 2014-02-14 MED ORDER — SODIUM CHLORIDE 0.9 % IV SOLN: Freq: Once | INTRAVENOUS | Status: DC

## 2014-02-14 NOTE — Progress Notes (Signed)
    Subjective: No abdominal pain, N/V. Reports low-volume hematochezia with bowel movements. States "like a period". Capsule study in process.   Objective: Vital signs in last 24 hours: Temp:  [97.7 F (36.5 C)-98.2 F (36.8 C)] 98.2 F (36.8 C) (08/19 0659) Pulse Rate:  [82-93] 85 (08/19 0659) Resp:  [12-21] 14 (08/19 0659) BP: (120-173)/(46-79) 149/49 mmHg (08/19 0659) SpO2:  [91 %-98 %] 91 % (08/19 0724) Weight:  [238 lb 8 oz (108.183 kg)] 238 lb 8 oz (108.183 kg) (08/19 0659) Last BM Date: 02/13/14 General:   Alert and oriented, pleasant Head:  Normocephalic and atraumatic. Abdomen:  Bowel sounds present, obese, soft, non-tender, Difficult to appreciate HSM due to large AP diameter. Neurologic:  Alert and  oriented x4;  grossly normal neurologically. Skin:  Warm and dry, intact without significant lesions.  Psych:  Alert and cooperative. Normal mood and affect.  Intake/Output from previous day: 08/18 0701 - 08/19 0700 In: 88.3 [I.V.:88.3] Out: 1000 [Urine:400; Stool:600] Intake/Output this shift:    Lab Results:  Recent Labs  02/13/14 1706 02/14/14 0036 02/14/14 0627  WBC 9.4 9.4 8.6  HGB 8.6* 7.1* 7.4*  HCT 25.0* 22.5* 23.8*  PLT 248 234 257   BMET  Recent Labs  02/12/14 2353 02/13/14 1044 02/14/14 0627  NA 142 143 147  K 4.2 4.2 4.4  CL 107 109 114*  CO2 20 20 21   GLUCOSE 138* 136* 143*  BUN 75* 74* 63*  CREATININE 4.19* 4.34* 3.67*  CALCIUM 8.6 8.8 8.5     Studies/Results: Dg Chest 2 View  02/12/2014   CLINICAL DATA:  Chest pain.  Unable to stand.  EXAM: CHEST  2 VIEW  COMPARISON:  01/27/2014.  FINDINGS: The heart is enlarged. There are no infiltrates or failure. No effusion or pneumothorax. Thoracic atherosclerosis. Similar appearance to priors.  IMPRESSION: No active cardiopulmonary disease.   Electronically Signed   By: Rolla Flatten M.D.   On: 02/12/2014 12:20    Assessment: 80 year old male with acute on chronic anemia, presenting with  melena and EGD revealing fundal gland polyps, pancreatic rest, 2 tiny areas of erosions/ulcerations in antrum likely not significant. No explanation for clinical presentation. Hgb continues to drift; would recommend blood transfusion to keep Hgb greater than 8. History of GI bleed in remote past likely secondary to AVMs. Extensive work-up in past.   Capsule study in process today; will be read tomorrow. Patient with reports of low-volume hematochezia. Rectal exam on admission without significant findings other than dark, heme positive stool. Painless. Provide Anusol suppositories. If capsule study negative, recommend colonoscopy as inpatient vs outpatient depending on stability.   As of note, Reglan 10 mg IV 10 minutes prior to capsule study was ordered and notated on procedure request form. Does not appear this was given for unclear reasons. Will discuss with nursing staff.   Plan: Complete capsule study today; will be read tomorrow May need colonoscopy inpatient; to be determined after capsule study and review of labs Recommend blood transfusion  PPI BID   Orvil Feil, ANP-BC [facility]     LOS: 2 days    02/14/2014, 8:04 AM

## 2014-02-14 NOTE — Progress Notes (Signed)
  PROGRESS NOTE  Gresham Caetano Gehring ZOX:[id number] DOB: 22-Jun-1931 DOA: 02/12/2014 PCP: Purvis Kilts, MD  Summary: 80 year old man presented with chest pain, dark-colored stools. Admitted for further evaluation of GI bleed, acute on chronic renal failure, anemia.  Assessment/Plan: 1. GI bleed. Etiology unclear. Small capsule study being completed today, results available 8/20. 2. Acute blood loss anemia. Hemoglobin trending downwards, no frank bleeding. 3. Acute renal failure superimposed on chronic kidney disease stage IV. Improving. 4. Diabetes mellitus. Stable. 5. History of upper GI bleed, or deficiency anemia 2012. EGD, colonoscopy and capsule study at that time. 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   Hemoglobin trending downwards but no frank bleeding. Transfuse 2 units packed red blood cells. Followup capsule endoscopy report tomorrow. Follow CBC.  BMP in the morning.  Code Status: full code DVT prophylaxis:  SCDs Family Communication: none present Disposition Plan: home  Murray Hodgkins, MD  [facility]  Pager [phone] If 7PM-7AM, please contact night-coverage at [email or web address], password [facility] 02/14/2014, 3:40 PM  LOS: 2 days   Consultants:  GI  Procedures:  8/18 EGD. No satisfactory explanation for GI bleed.  Antibiotics:    HPI/Subjective: Undergoing capsule study today. No significant bleeding. Overall doing okay.  Objective: Filed Vitals:   02/14/14 0659 02/14/14 0724 02/14/14 1420 02/14/14 1431  BP: 149/49   127/49  Pulse: 85   84  Temp: 98.2 F (36.8 C)   99 F (37.2 C)  TempSrc: Oral   Oral  Resp: 14   18  Height:      Weight: 108.183 kg (238 lb 8 oz)     SpO2: 93% 91% 94% 95%    Intake/Output Summary (Last 24 hours) at 02/14/14 1540 Last data filed at 02/14/14 1238  Gross per 24 hour  Intake  328.3 ml  Output   1400 ml  Net -1071.7 ml     Filed Weights   02/12/14 1438 02/13/14 0500 02/14/14 0659  Weight: 105.688 kg (233 lb) 107.4 kg  (236 lb 12.4 oz) 108.183 kg (238 lb 8 oz)    Exam:     Afebrile, vital signs stable. No hypoxia. Gen. Appears calm, comfortable.  Psych. Alert. Speech fluent and clear.  Cardiovascular. Regular rate and rhythm. No murmur, rub or gallop.  Respiratory. Clear to auscultation bilaterally. No wheezes, rales or rhonchi. Normal respiratory effort.  Data Reviewed:  Chemistry: Basic metabolic panel turning towards baseline creatinine function with BUN 63, creatinine 3.67.  Heme: Hemoglobin trending downwards, 6.8.  Scheduled Meds: . antiseptic oral rinse  7 mL Mouth Rinse BID  . insulin aspart  0-15 Units Subcutaneous TID WC  . insulin aspart  0-5 Units Subcutaneous QHS  . insulin aspart protamine- aspart  15 Units Subcutaneous BID WC  . ipratropium-albuterol  3 mL Nebulization TID  . nebivolol  10 mg Oral BH-q7a  . pantoprazole  40 mg Oral BID AC  . tamsulosin  0.4 mg Oral Daily  . terazosin  10 mg Oral QHS   Continuous Infusions: . sodium chloride 100 mL/hr at 02/14/14 1018    Principal Problem:   GI bleed Active Problems:   ANEMIA   HYPERTENSION   GERD   Melena   Weakness generalized   DM type 2 (diabetes mellitus, type 2)   Acute renal failure superimposed on stage 4 chronic kidney disease   Time spent 20 minutes

## 2014-02-14 NOTE — Progress Notes (Signed)
REVIEWED. AGREE. 

## 2014-02-14 NOTE — Progress Notes (Signed)
Patient is currently active with [facility] for chronic disease management services.  Patient has been engaged by a [employer].  Our community based plan of care has focused on disease management of diabetes.  Patient has made progress in the community and does engage his PCP with adherence to prescribed treatments.  Patient will receive a post discharge transition of care call and will be evaluated for monthly home visits for assessments and disease process education.  Inpatient Case Manager made aware that [facility] following via email from community based [facility]. Of note, [facility] services does not replace or interfere with any services that are arranged by inpatient case management or social work.  For additional questions or referrals please contact Corliss Blacker BSN RN [facility] Liaison at [phone].

## 2014-02-15 ENCOUNTER — Encounter (HOSPITAL_COMMUNITY): Payer: Self-pay | Admitting: Gastroenterology

## 2014-02-15 LAB — GLUCOSE, CAPILLARY
GLUCOSE-CAPILLARY: 100 mg/dL — AB (ref 70–99)
GLUCOSE-CAPILLARY: 105 mg/dL — AB (ref 70–99)
GLUCOSE-CAPILLARY: 92 mg/dL (ref 70–99)
Glucose-Capillary: 108 mg/dL — ABNORMAL HIGH (ref 70–99)

## 2014-02-15 LAB — CBC
HCT: 24.5 % — ABNORMAL LOW (ref 39.0–52.0)
HEMATOCRIT: 27.2 % — AB (ref 39.0–52.0)
HEMATOCRIT: 26.7 % — AB (ref 39.0–52.0)
HEMOGLOBIN: 8.8 g/dL — AB (ref 13.0–17.0)
HEMOGLOBIN: 8.6 g/dL — AB (ref 13.0–17.0)
Hemoglobin: 8 g/dL — ABNORMAL LOW (ref 13.0–17.0)
MCH: 29 pg (ref 26.0–34.0)
MCH: 29.2 pg (ref 26.0–34.0)
MCH: 28.7 pg (ref 26.0–34.0)
MCHC: 32.2 g/dL (ref 30.0–36.0)
MCHC: 32.4 g/dL (ref 30.0–36.0)
MCHC: 32.7 g/dL (ref 30.0–36.0)
MCV: 89.4 fL (ref 78.0–100.0)
MCV: 89 fL (ref 78.0–100.0)
MCV: 89.8 fL (ref 78.0–100.0)
PLATELETS: 243 10*3/uL (ref 150–400)
Platelets: 254 10*3/uL (ref 150–400)
Platelets: 254 10*3/uL (ref 150–400)
RBC: 2.74 MIL/uL — ABNORMAL LOW (ref 4.22–5.81)
RBC: 3 MIL/uL — AB (ref 4.22–5.81)
RBC: 3.03 MIL/uL — ABNORMAL LOW (ref 4.22–5.81)
RDW: 14.5 % (ref 11.5–15.5)
RDW: 14.8 % (ref 11.5–15.5)
RDW: 14.9 % (ref 11.5–15.5)
WBC: 6.6 10*3/uL (ref 4.0–10.5)
WBC: 7.1 10*3/uL (ref 4.0–10.5)
WBC: 7.6 10*3/uL (ref 4.0–10.5)

## 2014-02-15 LAB — TYPE AND SCREEN
ABO/RH(D): A POS
ANTIBODY SCREEN: NEGATIVE
UNIT DIVISION: 0
Unit division: 0

## 2014-02-15 LAB — BASIC METABOLIC PANEL
Anion gap: 11 (ref 5–15)
BUN: 45 mg/dL — AB (ref 6–23)
CHLORIDE: 114 meq/L — AB (ref 96–112)
CO2: 21 mEq/L (ref 19–32)
Calcium: 8.8 mg/dL (ref 8.4–10.5)
Creatinine, Ser: 3.12 mg/dL — ABNORMAL HIGH (ref 0.50–1.35)
GFR calc Af Amer: 20 mL/min — ABNORMAL LOW (ref >90)
GFR, EST NON AFRICAN AMERICAN: 17 mL/min — AB (ref >90)
Glucose, Bld: 128 mg/dL — ABNORMAL HIGH (ref 70–99)
POTASSIUM: 4.2 meq/L (ref 3.7–5.3)
Sodium: 146 mEq/L (ref 137–147)

## 2014-02-15 MED ORDER — INSULIN ASPART PROT & ASPART (70-30 MIX) 100 UNIT/ML ~~LOC~~ SUSP
10.0000 [IU] | Freq: Two times a day (BID) | SUBCUTANEOUS | Status: DC
  Administered 2014-02-15 – 2014-02-19 (×7): 10 [IU] via SUBCUTANEOUS

## 2014-02-15 MED ORDER — SODIUM CHLORIDE 0.9 % IV SOLN
INTRAVENOUS | Status: DC
  Administered 2014-02-15: 16:00:00 via INTRAVENOUS

## 2014-02-15 MED ORDER — PEG 3350-KCL-NA BICARB-NACL 420 G PO SOLR
4000.0000 mL | Freq: Once | ORAL | Status: AC
  Administered 2014-02-15: 4000 mL via ORAL
  Filled 2014-02-15: qty 4000

## 2014-02-15 NOTE — Op Note (Addendum)
Small Bowel [place] Capsule Study Procedure date:  02/15/14  Referring Provider:  Barney Drain, MD PCP:  Dr. Hilma Favors, Betsy Coder, MD  Indication for procedure:  Obscure GI bleeding, transfusion dependent. Presented with melena, heme positive stool, and now gross rectal bleeding. H/O UGI bleeding in setting of NSAIDS back in 2012. (EGD with gastri AVMs. Blood tinged in distal small bowel without obvious lesion on capsule study. Colonoscopy with blood colonic effluent throughout colon but distal TI normal. Tagged RBC study negative). Currently no NSAIDS or ASA. EGD two days ago showed fundal gland polyps, pancreatic rest, 2 tiny areas of erosions/ulceration not likely significant s/p biopsy. Nothing to explain his GI bleeding.   Hgb stable today. Still passing dark blood.  Patient data:  Wt: 237lb Ht: 67in Waist: Not recorded  Findings:  Complete small bowel study. Significant findings of shallow ulcers beginning at 3 hr 48 min and seen intermittently until 4 hr 30 min. At 5 hr 8 min small bowel ulcer with evidence of active bleeding noted. More blood noted from 5 hr 11 min through 5 hr 13 min. Initial cecal image, significant blood noted and persistent through the end of the study.   First Gastric image:  8 min 2 sec First Duodenal image: 3 hr 3 min 8 sec First Ileo-Cecal Valve image: 5 hr 23 min 51 sec First Cecal image: 5 hr 23 min 52 sec Gastric Passage time: 2 hr 54 min Small Bowel Passage time:  2 hr 20 min  Summary & Recommendations: Suspect GI bleeding from distal small bowel ulcers, likely reachable via terminal ileoscopy. Discussed with Dr. Gala Romney who reviewed pertinent images. Plan for colonoscopy with ileoscopy tomorrow. Plans for therapeutic measures and biopsies. Patient no longer on NSAIDs. ? Underlying IBD.   Attending note:  I have reviewed pertinent capsule images. Pathology producing bleeding is in the distal small bowel, likely within 20 cm of the ileocecal valve. I feel  it would be worthwhile to attempt ileocolonoscopy to reach this area with the colonoscope. This approach has been discussed with the patient at length. He understands Dr. Oneida Alar will perform tomorrow in my absence. Risks, benefits, limitations, alternatives have been reviewed. He is agreeable.       REVIEWED.

## 2014-02-15 NOTE — Progress Notes (Signed)
  PROGRESS NOTE  John [name] OYD:[id number] DOB: 1931/01/24 DOA: 02/12/2014 PCP: Purvis Kilts, MD  Summary: 80 year old man presented with chest pain, dark-colored stools. Admitted for further evaluation of GI bleed, acute on chronic renal failure, anemia.  Assessment/Plan: 1. GI bleed. Etiology unclear. Small capsule study completed, await results. 2. Acute blood loss anemia. Improved s/p transfusion 2 units PRBC 8/19. 3. Acute renal failure superimposed on chronic kidney disease stage IV. Continue to improve. 4. Diabetes mellitus. Remains stable. 5. History of upper GI bleed, or deficiency anemia 2012. EGD, colonoscopy and capsule study at that time. 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   Continue to follow CBC. Followup results for capsule endoscopy. Monitor for rebleeding.  Code Status: full code DVT prophylaxis:  SCDs Family Communication: none present Disposition Plan: home  Murray Hodgkins, MD  [facility]  Pager [phone] If 7PM-7AM, please contact night-coverage at [email or web address], password [facility] 02/15/2014, 12:10 PM  LOS: 3 days   Consultants:  GI  Procedures:  8/18 EGD. No satisfactory explanation for GI bleed.  8/19 transfusion 2 units packed red blood cells.  Antibiotics:    HPI/Subjective: No apparent bleeding. Hemoglobin responded appropriately to transfusion of 2 units packed red blood cells.  Objective: Filed Vitals:   02/15/14 0617 02/15/14 0641 02/15/14 0647 02/15/14 0755  BP: 147/50     Pulse: 78     Temp: 98.5 F (36.9 C)     TempSrc: Oral     Resp: 20     Height:      Weight:  111.948 kg (246 lb 12.8 oz)    SpO2: 97%  98% 98%    Intake/Output Summary (Last 24 hours) at 02/15/14 1210 Last data filed at 02/14/14 2117  Gross per 24 hour  Intake   1650 ml  Output    200 ml  Net   1450 ml     Filed Weights   02/13/14 0500 02/14/14 0659 02/15/14 0641  Weight: 107.4 kg (236 lb 12.4 oz) 108.183 kg (238 lb 8 oz) 111.948 kg (246 lb  12.8 oz)    Exam:     Remains afebrile, vitals are stable. No hypoxia.  General appears calm and comfortable. Speech fluent and clear.  Respiratory clear to auscultation bilaterally. No wheezes, rales or rhonchi. Normal respiratory effort.  Cardiovascular regular rate and rhythm. No murmur, rub or gallop.  Data Reviewed:  BUN, creatinine continued to trend downward.  Hemoglobin improved, 8.8. Platelet count of blood cell count within normal limits.  Scheduled Meds: . sodium chloride   Intravenous Once  . antiseptic oral rinse  7 mL Mouth Rinse BID  . insulin aspart  0-15 Units Subcutaneous TID WC  . insulin aspart  0-5 Units Subcutaneous QHS  . insulin aspart protamine- aspart  15 Units Subcutaneous BID WC  . ipratropium-albuterol  3 mL Nebulization TID  . nebivolol  10 mg Oral BH-q7a  . pantoprazole  40 mg Oral BID AC  . tamsulosin  0.4 mg Oral Daily  . terazosin  10 mg Oral QHS   Continuous Infusions:    Principal Problem:   GI bleed Active Problems:   ANEMIA   HYPERTENSION   GERD   Melena   Weakness generalized   DM type 2 (diabetes mellitus, type 2)   Acute renal failure superimposed on stage 4 chronic kidney disease   Time spent 15 minutes

## 2014-02-16 ENCOUNTER — Encounter (HOSPITAL_COMMUNITY): Payer: Self-pay | Admitting: *Deleted

## 2014-02-16 ENCOUNTER — Encounter (HOSPITAL_COMMUNITY)
Admission: EM | Disposition: A | Discharge status: Home Health | Payer: Self-pay | Source: Home / Self Care | Attending: Family Medicine

## 2014-02-16 ENCOUNTER — Encounter: Payer: Self-pay | Admitting: Internal Medicine

## 2014-02-16 DIAGNOSIS — D126 Benign neoplasm of colon, unspecified: Secondary | ICD-10-CM

## 2014-02-16 DIAGNOSIS — E119 Type 2 diabetes mellitus without complications: Secondary | ICD-10-CM

## 2014-02-16 HISTORY — PX: COLONOSCOPY: SHX5424

## 2014-02-16 LAB — BASIC METABOLIC PANEL
Anion gap: 11 (ref 5–15)
BUN: 32 mg/dL — AB (ref 6–23)
CO2: 23 mEq/L (ref 19–32)
Calcium: 8.4 mg/dL (ref 8.4–10.5)
Chloride: 108 mEq/L (ref 96–112)
Creatinine, Ser: 2.68 mg/dL — ABNORMAL HIGH (ref 0.50–1.35)
GFR, EST AFRICAN AMERICAN: 24 mL/min — AB (ref >90)
GFR, EST NON AFRICAN AMERICAN: 21 mL/min — AB (ref >90)
Glucose, Bld: 117 mg/dL — ABNORMAL HIGH (ref 70–99)
Potassium: 4 mEq/L (ref 3.7–5.3)
Sodium: 142 mEq/L (ref 137–147)

## 2014-02-16 LAB — GLUCOSE, CAPILLARY
GLUCOSE-CAPILLARY: 145 mg/dL — AB (ref 70–99)
Glucose-Capillary: 116 mg/dL — ABNORMAL HIGH (ref 70–99)
Glucose-Capillary: 120 mg/dL — ABNORMAL HIGH (ref 70–99)
Glucose-Capillary: 126 mg/dL — ABNORMAL HIGH (ref 70–99)

## 2014-02-16 LAB — CBC
HCT: 23.3 % — ABNORMAL LOW (ref 39.0–52.0)
HEMATOCRIT: 26.4 % — AB (ref 39.0–52.0)
Hemoglobin: 7.6 g/dL — ABNORMAL LOW (ref 13.0–17.0)
Hemoglobin: 8.4 g/dL — ABNORMAL LOW (ref 13.0–17.0)
MCH: 28.3 pg (ref 26.0–34.0)
MCH: 28.9 pg (ref 26.0–34.0)
MCHC: 31.8 g/dL (ref 30.0–36.0)
MCHC: 32.6 g/dL (ref 30.0–36.0)
MCV: 88.6 fL (ref 78.0–100.0)
MCV: 88.9 fL (ref 78.0–100.0)
PLATELETS: 224 10*3/uL (ref 150–400)
Platelets: 250 10*3/uL (ref 150–400)
RBC: 2.97 MIL/uL — ABNORMAL LOW (ref 4.22–5.81)
RBC: 2.63 MIL/uL — AB (ref 4.22–5.81)
RDW: 14.6 % (ref 11.5–15.5)
RDW: 14.6 % (ref 11.5–15.5)
WBC: 6 10*3/uL (ref 4.0–10.5)
WBC: 7.1 10*3/uL (ref 4.0–10.5)

## 2014-02-16 SURGERY — COLONOSCOPY
Anesthesia: Moderate Sedation

## 2014-02-16 MED ORDER — ONDANSETRON HCL 4 MG/2ML IJ SOLN
INTRAMUSCULAR | Status: AC
  Filled 2014-02-16: qty 2

## 2014-02-16 MED ORDER — SODIUM CHLORIDE 0.9 % IV SOLN: INTRAVENOUS | Status: DC

## 2014-02-16 MED ORDER — MIDAZOLAM HCL 5 MG/5ML IJ SOLN
INTRAMUSCULAR | Status: DC | PRN
  Administered 2014-02-16 (×2): 2 mg via INTRAVENOUS
  Administered 2014-02-16: 1 mg via INTRAVENOUS

## 2014-02-16 MED ORDER — MIDAZOLAM HCL 5 MG/5ML IJ SOLN
INTRAMUSCULAR | Status: AC
  Filled 2014-02-16: qty 10

## 2014-02-16 MED ORDER — FENTANYL CITRATE 0.05 MG/ML IJ SOLN
INTRAMUSCULAR | Status: AC
  Filled 2014-02-16: qty 4

## 2014-02-16 MED ORDER — GUAIFENESIN ER 600 MG PO TB12
600.0000 mg | ORAL_TABLET | Freq: Two times a day (BID) | ORAL | Status: DC
  Administered 2014-02-16 – 2014-02-19 (×7): 600 mg via ORAL
  Filled 2014-02-16 (×7): qty 1

## 2014-02-16 MED ORDER — ONDANSETRON HCL 4 MG/2ML IJ SOLN
INTRAMUSCULAR | Status: DC | PRN
  Administered 2014-02-16: 4 mg via INTRAVENOUS

## 2014-02-16 MED ORDER — MEPERIDINE HCL 100 MG/ML IJ SOLN
INTRAMUSCULAR | Status: AC
  Filled 2014-02-16: qty 2

## 2014-02-16 MED ORDER — GUAIFENESIN 100 MG/5ML PO SOLN: 200.0000 mg | ORAL | Status: DC | PRN

## 2014-02-16 MED ORDER — STERILE WATER FOR IRRIGATION IR SOLN
Status: DC | PRN
  Administered 2014-02-16: 11:00:00

## 2014-02-16 MED ORDER — FENTANYL CITRATE 0.05 MG/ML IJ SOLN
INTRAMUSCULAR | Status: DC | PRN
  Administered 2014-02-16 (×4): 25 ug via INTRAVENOUS

## 2014-02-16 NOTE — Progress Notes (Signed)
PROGRESS NOTE  John [name] TOI:[id number] DOB: May 21, 1931 DOA: 02/12/2014 PCP: Purvis Kilts, MD  Summary: 80 year old man presented with chest pain, dark-colored stools. Admitted for further evaluation of GI bleed, acute on chronic renal failure, anemia.  Assessment/Plan: 1. GI bleed. Etiology unclear but possibly secondary to small bowel ulcers, distal. Workup including small bowel capsule study and ileal colonoscopy. No further apparent bleeding. Hemoglobin has stabilized. 2. Acute blood loss anemia. Stable s/p transfusion 2 units PRBC 8/19. 3. Acute renal failure superimposed on chronic kidney disease stage IV. Appears to be at baseline. 4. Diabetes mellitus. Stable. 5. History of upper GI bleed, or deficiency anemia 2012. EGD, colonoscopy and capsule study at that time. 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   CBC in a.m. Discussed with Dr. Oneida Alar, stable, likely discharge him.  Per Dr. Oneida Alar: CONSIDER DBE AT [facility] IF PT BLEEDS AGAIN  Code Status: full code DVT prophylaxis:  SCDs Family Communication: none present Disposition Plan: home  Murray Hodgkins, MD  [facility]  Pager [phone] If 7PM-7AM, please contact night-coverage at [email or web address], password [facility] 02/16/2014, 5:44 PM  LOS: 4 days   Consultants:  GI  Procedures:  8/18 EGD. No satisfactory explanation for GI bleed.  8/19 transfusion 2 units packed red blood cells.  8/20 capsule study Suspect GI bleeding from distal small bowel ulcers, likely reachable via terminal ileoscopy.   8/21 colonoscopy: ENDOSCOPIC IMPRESSION:  1. NO SOURCE FOR GI BLEED IDENTIFIED. NO OLD BLOOD OR FRESH BLOOD  SEEN N ILEUM OR COLON.  2. TWO COLON POLYPS REMOVED  3. TheTC/[place] colon are redundant  4. Moderate sized internal hemorrhoids  Antibiotics:    HPI/Subjective: Complaints of cough otherwise doing okay. No apparent bleeding.  Objective: Filed Vitals:   02/16/14 1140 02/16/14 1209 02/16/14 1450 02/16/14 1457  BP:  119/52 146/65 138/53   Pulse: 75 75 83   Temp:  98.2 F (36.8 C) 99 F (37.2 C)   TempSrc:  Oral Oral   Resp: 19 20 18    Height:      Weight:      SpO2: 99% 91% 94% 96%    Intake/Output Summary (Last 24 hours) at 02/16/14 1744 Last data filed at 02/16/14 0653  Gross per 24 hour  Intake 744.17 ml  Output      0 ml  Net 744.17 ml     Filed Weights   02/13/14 0500 02/14/14 0659 02/15/14 0641  Weight: 107.4 kg (236 lb 12.4 oz) 108.183 kg (238 lb 8 oz) 111.948 kg (246 lb 12.8 oz)    Exam:     Afebrile, vital signs stable. No hypoxia.  Appears calm, comfortable.  Speech fluent and clear.  Cardiovascular regular rate and rhythm. No murmur, rub or gallop. Respiratory clear to auscultation bilaterally. No wheezes, rales or rhonchi. Normal respiratory effort.  Data Reviewed:  Capillary blood sugars well controlled.  Kidney function appears to be at baseline at this point.  Hemoglobin stable 8.4.  Scheduled Meds: . sodium chloride   Intravenous Once  . antiseptic oral rinse  7 mL Mouth Rinse BID  . guaiFENesin  600 mg Oral BID  . insulin aspart  0-15 Units Subcutaneous TID WC  . insulin aspart  0-5 Units Subcutaneous QHS  . insulin aspart protamine- aspart  10 Units Subcutaneous BID WC  . ipratropium-albuterol  3 mL Nebulization TID  . nebivolol  10 mg Oral BH-q7a  . pantoprazole  40 mg Oral BID AC  . tamsulosin  0.4 mg Oral Daily  .  terazosin  10 mg Oral QHS   Continuous Infusions: . sodium chloride 50 mL/hr at 02/15/14 1551    Principal Problem:   GI bleed Active Problems:   ANEMIA   HYPERTENSION   GERD   Melena   Weakness generalized   DM type 2 (diabetes mellitus, type 2)   Acute renal failure superimposed on stage 4 chronic kidney disease   Time spent 15 minutes

## 2014-02-16 NOTE — Op Note (Signed)
[facility] [address] [place], [address]   COLONOSCOPY PROCEDURE REPORT  PATIENT: John [name], John [name]  MR#: [id number] BIRTHDATE: 28-Nov-1930 , 83  yrs. old GENDER: Male ENDOSCOPIST: Barney Drain, MD REFERRED XY:[id number] Rourk, M.D.  Sharilyn Sites, M.D. PROCEDURE DATE:  02/16/2014 PROCEDURE:   Colonoscopy WITH OVERTUBE &  with snare and cold biopsy polypectomy INDICATIONS:Rectal Bleeding. MEDICATIONS: Fentanyl 100 mcg IV and Versed 5 mg IV  DESCRIPTION OF PROCEDURE:    Physical exam was performed.  Informed consent was obtained from the patient after explaining the benefits, risks, and alternatives to procedure.  The patient was connected to monitor and placed in left lateral position. Continuous oxygen was provided by nasal cannula and IV medicine administered through an indwelling cannula.  After administration of sedation and rectal exam, the patients rectum was intubated and the EC-[id number] ([id number])  colonoscope was advanced under direct visualization to the ileum.  The scope was removed slowly by carefully examining the color, texture, anatomy, and integrity mucosa on the way out.  The patient was recovered in endoscopy and discharged home in satisfactory condition.    COLON FINDINGS: The mucosa appeared normal in the terminal ileum.  10-20 CM VISUALIZED. A single polyp measuring 6-7 mm in size was found in the proximal transverse colon.  A polypectomy was performed using snare cautery.  A sessile polyp measuring 3 mm in size was found in the descending colon.  A polypectomy was performed with cold forceps.  , The TRANSVERSE/SIGMOID colon ARE redundant.  IN SPITE OF THE OVERTUBE, manual abdominal counter-pressure was used to reach the cecum.  The patient was moved on to their back to reach the cecum, and Moderate sized internal hemorrhoids were found.  PREP QUALITY: good.  CECAL W/D TIME: 30 minutes     COMPLICATIONS: None  ENDOSCOPIC IMPRESSION: 1.    NO SOURCE FOR GI BLEED IDENTIFIED. NO OLD BLOOD OR FRESH BLOOD SEEN N ILEUM OR COLON. 2.   TWO COLON POLYPS REMOVED 3.   TheTC/[place] colon are redundant 4.   Moderate sized internal hemorrhoids  RECOMMENDATIONS: ADVANCE DIET AWAIT BIOPSY CONSIDER DBE AT [facility] IF PT BLEEDS AGAIN    _______________________________eSigned:  Barney Drain, MD 02/16/2014 12:05 PM

## 2014-02-16 NOTE — H&P (Signed)
Primary Care Physician:  Purvis Kilts, MD Primary Gastroenterologist:  Dr. Oneida Alar  Pre-Procedure History & Physical: HPI:  John [name] is a 80 y.o. male here for  BRBPR.  Past Medical History  Diagnosis Date  . GI bleed     2006, TCS/TI showed small polyp. EGD showed erosive antral gastritis with two polyp, one oozing and tx. HP neg. SB capsule shoed few jejunal ulcers with bleeding (naproxen and ASA)  . GI bleed     02/2010, EGD->pancreatitc rest, fundal gland polyps,  no bleeding. TCS->blood-tinged colonic effluent throughout the colon without bleeding lesion found, nl TI. SB capsule->SB erosions, nonbleeding, incomplete study. Patient on naproxyn.   . DM (diabetes mellitus)   . Pneumonia   . Hyperlipidemia   . HTN (hypertension)   . Stroke   . GERD (gastroesophageal reflux disease)   . Poor vision     left eye  . Nephrolithiasis   . Sleep apnea   . Obesity   . Ischemic colitis, enteritis, or enterocolitis     flex sig 01/2010  . Chronic renal insufficiency   . Gastric AVM 10/24/10    GI bleed EGD w/ bicap and hemoclip placement, 2 AVMs, presented w/bright red rectal bleeding  . GI bleed 10/27/10    ileocolonoscopy/GIVENS capsule study by Dr Rourk->bloody colonic effluent throughout colon but no lesion identified, TA @ hepatic flexure, fresh blood in dital SB on capsule. Nuc med RBC study negative  . Tubular adenoma of colon 10/27/10    on colonoscopy Dr Gala Romney    Past Surgical History  Procedure Laterality Date  . Back surgery    . Transurethral resection of prostate    . Cataract extraction      bilateral  . Appendectomy    . Cholecystectomy    . Elbow surgery    . Esophagogastroduodenoscopy  10/23/10    Dr. Tora Kindred arteriovenous malformations,GI bleed- capsule  . Colonoscopy  10/27/10    Dr. Chauncy Lean adenoma, normal rectum bloody colonic effluent throughout the colon with out bleeding lesion seen.   Freda Munro capsule study N/A 02/14/2014   Procedure: GIVENS CAPSULE STUDY;  Surgeon: Danie Binder, MD;  Location: [facility];  Service: Endoscopy;  Laterality: N/A;  NEEDS REGLAN 10 MG IV X 1 10 minutes prior to capsule ingestion.   . Esophagogastroduodenoscopy N/A 02/13/2014    Procedure: ESOPHAGOGASTRODUODENOSCOPY (EGD);  Surgeon: Daneil Dolin, MD;  Location: [facility];  Service: Endoscopy;  Laterality: N/A;    Prior to Admission medications   Medication Sig Start Date End Date Taking? Authorizing Provider  amLODipine (NORVASC) 5 MG tablet Take 7.5 mg by mouth daily.   Yes Historical Provider, MD  Cholecalciferol (VITAMIN D) 2000 UNITS CAPS Take 1 capsule by mouth daily.   Yes Historical Provider, MD  Choline Fenofibrate (TRILIPIX) 135 MG capsule Take 135 mg by mouth at bedtime.    Yes Historical Provider, MD  ciprofloxacin (CIPRO) 500 MG tablet Take 500 mg by mouth daily.   Yes Historical Provider, MD  ferrous sulfate 325 (65 FE) MG tablet Take 325 mg by mouth daily.    Yes Historical Provider, MD  furosemide (LASIX) 40 MG tablet Take 20-40 mg by mouth 2 (two) times daily. Patient takes 1 tablet in the morning and 1/2 at night   Yes Historical Provider, MD  insulin lispro protamine-lispro (HUMALOG 75/25 MIX) (75-25) 100 UNIT/ML SUSP injection Inject 30 Units into the skin 2 (two) times daily with a meal. 01/29/14  Yes Kathie Dike, MD  losartan (COZAAR) 50 MG tablet Take 1 tablet by mouth daily. 11/23/13  Yes Historical Provider, MD  nebivolol (BYSTOLIC) 10 MG tablet Take 10 mg by mouth every morning.     Yes Historical Provider, MD  pantoprazole (PROTONIX) 40 MG tablet Take 40 mg by mouth daily.    Yes Historical Provider, MD  potassium chloride (KLOR-CON M10) 10 MEQ tablet Take 1 tablet (10 mEq total) by mouth daily. 12/19/13  Yes Troy Sine, MD  tamsulosin (FLOMAX) 0.4 MG CAPS capsule Take 0.4 mg by mouth daily.   Yes Historical Provider, MD  terazosin (HYTRIN) 10 MG capsule Take 10 mg by mouth at bedtime.   Yes  Historical Provider, MD    Allergies as of 02/12/2014 - Review Complete 02/12/2014  Allergen Reaction Noted  . Latex Other (See Comments)   . Propoxyphene  01/27/2014  . Sulfa antibiotics Other (See Comments) 01/27/2014  . Betadine [povidone iodine] Rash 02/13/2011    Family History  Problem Relation Age of Onset  . Aneurysm Daughter     brain, deceased age 35  . GI problems Brother     gi bleed  . Cancer Sister     unknown type  . Colon cancer Neg Hx   . Liver disease Neg Hx     History   Social History  . Marital Status: Widowed    Spouse Name: N/A    Number of Children: 2  . Years of Education: N/A   Occupational History  . retired     [employer]   Social History Main Topics  . Smoking status: Never Smoker   . Smokeless tobacco: Never Used  . Alcohol Use: No  . Drug Use: No  . Sexual Activity: No   Other Topics Concern  . Not on file   Social History Narrative  . No narrative on file    Review of Systems: See HPI, otherwise negative ROS   Physical Exam: BP 170/71  Pulse 85  Temp(Src) 98 F (36.7 C) (Oral)  Resp 21  Ht 5\' 7"  (1.702 m)  Wt 246 lb 12.8 oz (111.948 kg)  BMI 38.65 kg/m2  SpO2 93% General:   Alert,  pleasant and cooperative in NAD Head:  Normocephalic and atraumatic. Neck:  Supple; Lungs:  Clear throughout to auscultation.    Heart:  Regular rate and rhythm. Abdomen:  Soft, nontender and nondistended. Normal bowel sounds, without guarding, and without rebound.   Neurologic:  Alert and  oriented x4;  grossly normal neurologically.  Impression/Plan:    BRBPR  PLAN: TCS TODAY

## 2014-02-16 NOTE — Progress Notes (Signed)
Patient scheduled for ileocolonoscopy today with Dr. Oneida Alar. Overnight Hgb down a gram (drawn around 12am). Ordered stat CBC, BMP. May need additional blood transfusion prior to colonoscopy today.   John [name], ANP-BC [facility]

## 2014-02-17 ENCOUNTER — Other Ambulatory Visit: Payer: Self-pay | Admitting: Cardiovascular Disease

## 2014-02-17 LAB — CBC
HCT: 24.2 % — ABNORMAL LOW (ref 39.0–52.0)
Hemoglobin: 7.7 g/dL — ABNORMAL LOW (ref 13.0–17.0)
MCH: 28.5 pg (ref 26.0–34.0)
MCHC: 31.8 g/dL (ref 30.0–36.0)
MCV: 89.6 fL (ref 78.0–100.0)
Platelets: 225 10*3/uL (ref 150–400)
RBC: 2.7 MIL/uL — ABNORMAL LOW (ref 4.22–5.81)
RDW: 14.6 % (ref 11.5–15.5)
WBC: 6.3 10*3/uL (ref 4.0–10.5)

## 2014-02-17 LAB — GLUCOSE, CAPILLARY
Glucose-Capillary: 124 mg/dL — ABNORMAL HIGH (ref 70–99)
Glucose-Capillary: 130 mg/dL — ABNORMAL HIGH (ref 70–99)
Glucose-Capillary: 123 mg/dL — ABNORMAL HIGH (ref 70–99)

## 2014-02-17 LAB — PREPARE RBC (CROSSMATCH)

## 2014-02-17 NOTE — Progress Notes (Signed)
  Assessment/Plan: ADMITTED GI BLEED. NO S/Sx OF ACTIVE GI BLEED. HB STABLE. EGD/TCS DID NOT IDENTIFY A SOURCE OF BLEEDING. NOW WITH SOB, WHEEZING. DISCUSSED WITH DR. Sarajane Jews.  PLAN: 1. CONSIDER 1u pRBCS 2. DBE AT [facility] IF HE RE-BLEEDS 3. WILL SIGN OFF. CALL WITH QUESTIONS OR CONCERNS.  Subjective: Since I last evaluated the patient HE HAS HAD NO BRBPR OR MELENA. C/O SEVERE SOB AND CAN'T BREATH.  Objective: Vital signs in last 24 hours: Temp:  [98 F (36.7 C)-99.5 F (37.5 C)] 98.7 F (37.1 C) (08/22 0642) Pulse Rate:  [73-91] 82 (08/22 0642) Resp:  [15-26] 18 (08/22 0642) BP: (114-170)/(44-109) 160/50 mmHg (08/22 0642) SpO2:  [87 %-99 %] 93 % (08/22 0801) Last BM Date: 02/16/14  Intake/Output from previous day: 08/21 0701 - 08/22 0700 In: 240 [P.O.:240] Out: -  Intake/Output this shift: Total I/O In: 650 [P.O.:650] Out: 250 [Urine:250]  General appearance: alert, cooperative and mild distress. ABLE TO COMPLETE FULL SENTENCES BUT WITH DIFFICULTY.  Resp: wheezes bilaterally. FAIR AIR MOVEMENT Cardio: regular rate and rhythm GI: soft, MILD PERI-UMBILICAL TTP, bowel sounds normal; NO REBOUND OR GUARDING Extremities: edema TRACE BIL LE  Lab Results:  Recent Labs  02/16/14 0014 02/16/14 0822 02/17/14 0555  WBC 6.0 7.1 6.3  HGB 7.6* 8.4* 7.7*  HCT 23.3* 26.4* 24.2*  PLT 224 250 225   BMET  Recent Labs  02/15/14 0840 02/16/14 0822  NA 146 142  K 4.2 4.0  CL 114* 108  CO2 21 23  GLUCOSE 128* 117*  BUN 45* 32*  CREATININE 3.12* 2.68*  CALCIUM 8.8 8.4   :   Medications: I have reviewed the patient's current medications.  LOS: 5 days   [facility] 02/17/2014, 9:25 AM

## 2014-02-17 NOTE — Progress Notes (Signed)
PROGRESS NOTE  Shanna Strength Barrie DUK:[id number] DOB: Jul 15, 1930 DOA: 02/12/2014 PCP: Purvis Kilts, MD  Summary: 80 year old man presented with chest pain, dark-colored stools. Admitted for further evaluation of GI bleed, acute on chronic renal failure, anemia.  Assessment/Plan: 1. GI bleed. Etiology unclear but possibly secondary to small bowel ulcers, distal. Workup including small bowel capsule study and ileal colonoscopy. No recurrent bleeding. 2. Acute blood loss anemia, symptomatic. Stable s/p transfusion 2 units PRBC 8/19. Plan further transfusion today. 3. Acute renal failure superimposed on chronic kidney disease stage IV. At baseline. 4. Diabetes mellitus. Remained stable. 5. History of upper GI bleed, or deficiency anemia 2012. EGD, colonoscopy and capsule study at that time. 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   Currently appears stable. Shortness of breath seems to have resolved. We will plan one unit packed red blood cell transfusion based on lower hemoglobin and GI recommendations.  Monitor today. Possible discharge tomorrow.  Per Dr. Oneida Alar: CONSIDER DBE AT [facility] IF PT BLEEDS AGAIN  Code Status: full code DVT prophylaxis:  SCDs Family Communication: none present Disposition Plan: home  Murray Hodgkins, MD  [facility]  Pager [phone] If 7PM-7AM, please contact night-coverage at [email or web address], password [facility] 02/17/2014, 12:06 PM  LOS: 5 days   Consultants:  GI  Procedures:  8/18 EGD. No satisfactory explanation for GI bleed.  8/19 transfusion 2 units packed red blood cells.  8/20 capsule study Suspect GI bleeding from distal small bowel ulcers, likely reachable via terminal ileoscopy.   8/21 colonoscopy: ENDOSCOPIC IMPRESSION:  1. NO SOURCE FOR GI BLEED IDENTIFIED. NO OLD BLOOD OR FRESH BLOOD  SEEN N ILEUM OR COLON.  2. TWO COLON POLYPS REMOVED  3. TheTC/[place] colon are redundant  4. Moderate sized internal  hemorrhoids  Antibiotics:    HPI/Subjective: Dr. Oneida Alar d/w with me, reported patient was SOB.  Patient currently feels well, breathing fine, no complaints except some "burning" in abdomen. No known bleeding.  Objective: Filed Vitals:   02/16/14 2045 02/16/14 2100 02/17/14 0642 02/17/14 0801  BP: 155/55  160/50   Pulse: 91 83 82   Temp: 99.5 F (37.5 C)  98.7 F (37.1 C)   TempSrc: Oral  Oral   Resp: 18 16 18    Height:      Weight:      SpO2: 91% 91% 97% 93%    Intake/Output Summary (Last 24 hours) at 02/17/14 1206 Last data filed at 02/17/14 0900  Gross per 24 hour  Intake    890 ml  Output    250 ml  Net    640 ml     Filed Weights   02/13/14 0500 02/14/14 0659 02/15/14 0641  Weight: 107.4 kg (236 lb 12.4 oz) 108.183 kg (238 lb 8 oz) 111.948 kg (246 lb 12.8 oz)    Exam:     Afebrile, vital signs stable. Possible mild hypoxia.  Gen. Appears calm and comfortable.  Psychiatric. Alert. Speech fluent and clear.  Respiratory clear to auscultation bilaterally. Good air movement. No frank wheezes, rales or rhonchi. Normal respiratory effort.  Cardiovascular regular rate and rhythm. No murmur, rub or gallop. No lower extremity edema.  Data Reviewed:  Capillary blood sugars stable.  Hemoglobin somewhat lower, 7.7.  Scheduled Meds: . sodium chloride   Intravenous Once  . antiseptic oral rinse  7 mL Mouth Rinse BID  . guaiFENesin  600 mg Oral BID  . insulin aspart  0-15 Units Subcutaneous TID WC  . insulin aspart  0-5 Units Subcutaneous QHS  .  insulin aspart protamine- aspart  10 Units Subcutaneous BID WC  . ipratropium-albuterol  3 mL Nebulization TID  . nebivolol  10 mg Oral BH-q7a  . pantoprazole  40 mg Oral BID AC  . tamsulosin  0.4 mg Oral Daily  . terazosin  10 mg Oral QHS   Continuous Infusions:    Principal Problem:   GI bleed Active Problems:   ANEMIA   HYPERTENSION   GERD   Melena   Weakness generalized   DM type 2 (diabetes mellitus,  type 2)   Acute renal failure superimposed on stage 4 chronic kidney disease   Time spent 15 minutes

## 2014-02-18 LAB — CBC
HEMATOCRIT: 25 % — AB (ref 39.0–52.0)
HEMOGLOBIN: 8 g/dL — AB (ref 13.0–17.0)
MCH: 28.4 pg (ref 26.0–34.0)
MCHC: 32 g/dL (ref 30.0–36.0)
MCV: 88.7 fL (ref 78.0–100.0)
Platelets: 174 10*3/uL (ref 150–400)
RBC: 2.82 MIL/uL — ABNORMAL LOW (ref 4.22–5.81)
RDW: 14.8 % (ref 11.5–15.5)
WBC: 8.4 10*3/uL (ref 4.0–10.5)

## 2014-02-18 LAB — TYPE AND SCREEN
ABO/RH(D): A POS
Antibody Screen: NEGATIVE
UNIT DIVISION: 0

## 2014-02-18 LAB — GLUCOSE, CAPILLARY
GLUCOSE-CAPILLARY: 125 mg/dL — AB (ref 70–99)
GLUCOSE-CAPILLARY: 123 mg/dL — AB (ref 70–99)
GLUCOSE-CAPILLARY: 150 mg/dL — AB (ref 70–99)
GLUCOSE-CAPILLARY: 148 mg/dL — AB (ref 70–99)
Glucose-Capillary: 119 mg/dL — ABNORMAL HIGH (ref 70–99)

## 2014-02-18 NOTE — Progress Notes (Signed)
PROGRESS NOTE  John [name] EOF:[id number] DOB: 09-Feb-1931 DOA: 02/12/2014 PCP: Purvis Kilts, MD  Summary: 80 year old man presented with chest pain, dark-colored stools. Admitted for further evaluation of GI bleed, acute on chronic renal failure, anemia.  Assessment/Plan: 1. GI bleed. Etiology unclear but possibly secondary to small bowel ulcers, distal. Workup including small bowel capsule study and ileal colonoscopy. No recurrent bleeding. 2. Acute blood loss anemia, symptomatic. S/p transfusion 2 units PRBC 8/19, 1 unit packed red blood cells 8/22. 3. Acute renal failure superimposed on chronic kidney disease stage IV. At baseline. 4. Diabetes mellitus. Stable. 5. History of upper GI bleed, or deficiency anemia 2012. EGD, colonoscopy and capsule study at that time. 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  Overall stable although his hemoglobin did not respond to transfusion as one would expect. There is no evidence of ongoing blood loss. Discussed briefly with Dr. Oneida Alar, plan to observe one additional day. Hemoglobin is stable tomorrow we will plan discharge home.  Per Dr. Oneida Alar: CONSIDER DBE AT [facility] IF PT BLEEDS AGAIN  Code Status: full code DVT prophylaxis:  SCDs Family Communication: none present Disposition Plan: home  Murray Hodgkins, MD  [facility]  Pager [phone] If 7PM-7AM, please contact night-coverage at [email or web address], password [facility] 02/18/2014, 4:29 PM  LOS: 6 days   Consultants:  GI  Procedures:  8/18 EGD. No satisfactory explanation for GI bleed.  8/19 transfusion 2 units packed red blood cells.  8/20 capsule study Suspect GI bleeding from distal small bowel ulcers, likely reachable via terminal ileoscopy.   8/21 colonoscopy: ENDOSCOPIC IMPRESSION:  1. NO SOURCE FOR GI BLEED IDENTIFIED. NO OLD BLOOD OR FRESH BLOOD  SEEN N ILEUM OR COLON.  2. TWO COLON POLYPS REMOVED  3. TheTC/[place] colon are redundant  4. Moderate sized internal hemorrhoids  8/22  transfusion one unit packed red blood cells  Antibiotics:    HPI/Subjective: He feels "rough". Appetite not as good today. Did eat some breakfast. Some bloating, has had multiple BM. Some low back pain.  Had one unit packed red blood cells transfusion yesterday  Objective: Filed Vitals:   02/17/14 2047 02/18/14 0437 02/18/14 0730 02/18/14 1306  BP: 159/53 116/84  155/57  Pulse: 81 81  86  Temp: 99.1 F (37.3 C) 99.7 F (37.6 C)  98.7 F (37.1 C)  TempSrc: Oral Oral  Oral  Resp: 18 18  18   Height:      Weight:  113.127 kg (249 lb 6.4 oz)    SpO2: 97% 93% 94% 98%    Intake/Output Summary (Last 24 hours) at 02/18/14 1629 Last data filed at 02/18/14 1518  Gross per 24 hour  Intake    480 ml  Output    975 ml  Net   -495 ml     Filed Weights   02/14/14 0659 02/15/14 0641 02/18/14 0437  Weight: 108.183 kg (238 lb 8 oz) 111.948 kg (246 lb 12.8 oz) 113.127 kg (249 lb 6.4 oz)    Exam:     Remains afebrile, stable hypoxia.  Appears calm, mildly uncomfortable. Nontoxic.  Respiratory clear to auscultation bilaterally. No wheezes, rales or rhonchi. Normal respiratory effort.  Cardiovascular regular rate and rhythm. No murmur, rub or gallop. 1+ bilateral lower extremity edema.  Abdomen soft, somewhat distended, nontender.  Data Reviewed:  Capillary blood sugars remained stable.  Hemoglobin 8.0  Scheduled Meds: . sodium chloride   Intravenous Once  . antiseptic oral rinse  7 mL Mouth Rinse BID  . guaiFENesin  600  mg Oral BID  . insulin aspart  0-15 Units Subcutaneous TID WC  . insulin aspart  0-5 Units Subcutaneous QHS  . insulin aspart protamine- aspart  10 Units Subcutaneous BID WC  . ipratropium-albuterol  3 mL Nebulization TID  . nebivolol  10 mg Oral BH-q7a  . pantoprazole  40 mg Oral BID AC  . tamsulosin  0.4 mg Oral Daily  . terazosin  10 mg Oral QHS   Continuous Infusions:    Principal Problem:   GI bleed Active Problems:   ANEMIA    HYPERTENSION   GERD   Melena   Weakness generalized   DM type 2 (diabetes mellitus, type 2)   Acute renal failure superimposed on stage 4 chronic kidney disease   Time spent 15 minutes

## 2014-02-19 ENCOUNTER — Other Ambulatory Visit: Payer: Self-pay

## 2014-02-19 ENCOUNTER — Telehealth: Payer: Self-pay

## 2014-02-19 DIAGNOSIS — K922 Gastrointestinal hemorrhage, unspecified: Secondary | ICD-10-CM

## 2014-02-19 LAB — CBC
HCT: 24.3 % — ABNORMAL LOW (ref 39.0–52.0)
Hemoglobin: 7.8 g/dL — ABNORMAL LOW (ref 13.0–17.0)
MCH: 28.5 pg (ref 26.0–34.0)
MCHC: 32.1 g/dL (ref 30.0–36.0)
MCV: 88.7 fL (ref 78.0–100.0)
PLATELETS: 277 10*3/uL (ref 150–400)
RBC: 2.74 MIL/uL — AB (ref 4.22–5.81)
RDW: 14.9 % (ref 11.5–15.5)
WBC: 9.1 10*3/uL (ref 4.0–10.5)

## 2014-02-19 LAB — GLUCOSE, CAPILLARY
Glucose-Capillary: 133 mg/dL — ABNORMAL HIGH (ref 70–99)
Glucose-Capillary: 127 mg/dL — ABNORMAL HIGH (ref 70–99)

## 2014-02-19 MED ORDER — PANTOPRAZOLE SODIUM 40 MG PO TBEC: 40.0000 mg | DELAYED_RELEASE_TABLET | Freq: Two times a day (BID) | ORAL | Status: DC

## 2014-02-19 NOTE — Telephone Encounter (Signed)
Message copied by Claudina Lick on Mon Feb 19, 2014  7:12 PM ------      Message from: Daneil Dolin      Created: Mon Feb 19, 2014 10:44 AM       Patient needs office visit with extender in 2-3 weeks from now. Being discharge from the hospital today. Needs an H&H just before that visit ------

## 2014-02-19 NOTE — Progress Notes (Signed)
Pt states he wants to rest now and that he will walk tomorrow. Pt educated on the importance of ambulation.

## 2014-02-19 NOTE — Telephone Encounter (Signed)
Rx refill sent to patient pharmacy   

## 2014-02-19 NOTE — Progress Notes (Signed)
    Subjective: Feels a "little" constipated. No hematochezia. No melena.   Objective: Vital signs in last 24 hours: Temp:  [98.4 F (36.9 C)-99.6 F (37.6 C)] 98.4 F (36.9 C) (08/24 0500) Pulse Rate:  [81-86] 81 (08/24 0500) Resp:  [18-20] 20 (08/24 0500) BP: (139-155)/(49-58) 139/52 mmHg (08/24 0500) SpO2:  [90 %-98 %] 94 % (08/24 0725) Weight:  [254 lb 3.2 oz (115.304 kg)] 254 lb 3.2 oz (115.304 kg) (08/24 0500) Last BM Date: 02/18/14 General:   Alert and oriented, pleasant Abdomen:  Sitting up in chair, limited exam. Bowel sounds present, soft, protuberant. Neurologic:  Alert and  oriented x4;  grossly normal neurologically. Skin:  Warm and dry, intact without significant lesions.  Psych:  Alert and cooperative. Normal mood and affect.  Intake/Output from previous day: 08/23 0701 - 08/24 0700 In: 480 [P.O.:480] Out: 750 [Urine:750] Intake/Output this shift:    Lab Results:  Recent Labs  02/17/14 0555 02/18/14 0618 02/19/14 0543  WBC 6.3 8.4 9.1  HGB 7.7* 8.0* 7.8*  HCT 24.2* 25.0* 24.3*  PLT 225 174 277     Assessment/Plan: 79 year old male with obscure GI bleeding, transfusion-dependent anemia, EGD with fundal gland polyps, pancreatic rest, 2 tiny areas of erosions/ulceration s/p biopsy. Capsule with distal small bowel ulcers. Colonoscopy without source for GI bleeding identified. Hgb remains stable. Will follow-up in our office Sept 22 with Dr. Gala Romney. Double balloon enteroscopy next step if any evidence of recurrent bleeding. Dr. Sarajane Jews and Dr. Gala Romney have discussed patient's plan of care. Close outpatient follow-up.   Orvil Feil, ANP-BC [facility]       LOS: 7 days    02/19/2014, 9:44 AM

## 2014-02-19 NOTE — Progress Notes (Signed)
Nutrition Brief Note  Patient identified due to length of stay. Admitted for GI bleed, acute blood loss anemia, acute renal failure and CKD Stage IV.   Wt Readings from Last 15 Encounters:  02/19/14 254 lb 3.2 oz (115.304 kg)  02/19/14 254 lb 3.2 oz (115.304 kg)  02/19/14 254 lb 3.2 oz (115.304 kg)  02/19/14 254 lb 3.2 oz (115.304 kg)  01/27/14 245 lb 6.4 oz (111.313 kg)  07/10/13 247 lb (112.038 kg)  05/02/13 250 lb 12.8 oz (113.762 kg)  10/24/12 255 lb (115.667 kg)  05/20/12 266 lb 6.4 oz (120.838 kg)  04/06/12 267 lb 9.6 oz (121.383 kg)  06/26/11 248 lb (112.492 kg)  02/13/11 248 lb 11.2 oz (112.81 kg)  02/06/11 252 lb (114.306 kg)  11/17/10 247 lb 3.2 oz (112.129 kg)  10/17/10 252 lb (114.306 kg)   . Sodium  Date/Time Value Ref Range Status  02/16/2014  8:22 AM 142  137 - 147 mEq/L Final  02/15/2014  8:40 AM 146  137 - 147 mEq/L Final  02/14/2014  6:27 AM 147  137 - 147 mEq/L Final  11/13/2010  9:58 AM 141  137 - 147 mmol/L Final    Potassium  Date/Time Value Ref Range Status  02/16/2014  8:22 AM 4.0  3.7 - 5.3 mEq/L Final  02/15/2014  8:40 AM 4.2  3.7 - 5.3 mEq/L Final  02/14/2014  6:27 AM 4.4  3.7 - 5.3 mEq/L Final  11/13/2010  9:58 AM 4.2   Final    Phosphorus  Date/Time Value Ref Range Status  01/15/2010  4:49 AM 3.0  2.3 - 4.6 mg/dL Final    Magnesium  Date/Time Value Ref Range Status  02/20/2011  5:20 AM 2.3  1.5 - 2.5 mg/dL Final  10/23/2010  3:53 PM 2.3  1.5 - 2.5 mg/dL Final  04/02/2010  2:26 PM 1.9  1.5 - 2.5 mg/dL Final    Body mass index is 39.8 kg/(m^2). Patient meets criteria for obesity class 2 based on current BMI. His weight is within usual body weight range.  Pt was NPO/Clear Liquids for majority of time from 8/17-8/21. Diet advanced to CHO modified for lunch on 8/21 and his appetite and po intake have been excellent. Pt is consuming approximately 100% of meals at this time. Labs and medications reviewed.   No nutrition interventions warranted at this  time. If nutrition issues arise, please consult RD.   Colman Cater MS,RD,CSG,LDN Office: [phone] Pager: [phone]

## 2014-02-19 NOTE — Telephone Encounter (Signed)
Lab order mailed to pt. Please schedule ov.

## 2014-02-19 NOTE — Discharge Summary (Signed)
Physician Discharge Summary  John [name] HYI:[id number] DOB: June 20, 1931 DOA: 02/12/2014  PCP: John Kilts, MD  Admit date: 02/12/2014 Discharge date: 02/19/2014  Recommendations for Outpatient Follow-up:  1. GI bleed, thought secondary to small bowel ulcers, distal. See further discussion below. May need double balloon enteroscopy.   2. ABLA, consider repeat CBC in 1 week 3. Follow-up CKD stage IV on Lasix and ARB 4. Resume HH PT   Follow-up Information   Follow up with John [name].   Contact information:   [address] [place] [address] [phone]       Follow up with John Kilts, MD. Schedule an appointment as soon as possible for a visit in 1 week.   Specialty:  Family Medicine   Contact information:   [address] [place] [address] [phone]       Follow up with John Rudd, MD. Schedule an appointment as soon as possible for a visit in 3 weeks.   Specialty:  Gastroenterology   Contact information:   [place] [address] [place] [address] [phone]      Discharge Diagnoses:  1. GI bleed thought secondary to small bowel ulcers 2. Acute blood loss anemia 3. Acute renal failure superimposed on chronic kidney disease stage IV  4. Diabetes mellitus, insulin requiring, uncontrolled with hemoglobin A1c 7.3   Discharge Condition: Improved  Disposition: Return home, resume home health  Diet recommendation: Heart healthy diabetic diet  Filed Weights   02/15/14 0641 02/18/14 0437 02/19/14 0500  Weight: 111.948 kg (246 lb 12.8 oz) 113.127 kg (249 lb 6.4 oz) 115.304 kg (254 lb 3.2 oz)    History of present illness:  80 year old man presented with chest pain, dark-colored stools. Admitted for further evaluation of GI bleed, acute on chronic renal failure, anemia.  Hospital Course:  Mr. [name] was seen in consultation with gastroenterology and underwent blood transfusion.  EGD was unremarkable. Capsule study revealed suspected GI bleeding from distal small bowel ulcers, thought reachable via terminal ileoscopy, however colonoscopy failed to reveal source of bleeding, likely because small bowel ulcers were too proximal. Bleeding however stopped. Hemoglobin has stabilized. He is now stable for discharge, plans for outpatient GI followup and consideration of double balloon enteroscopy at [facility]. Individual issues as below.  1. GI bleed. Though secondary to small bowel ulcers, distal. Workup including small bowel capsule study and ileal colonoscopy. No recurrent bleeding. Hemoglobin stable.  2. Acute blood loss anemia. Stable. S/p transfusion 2 units PRBC 8/19, 1 unit packed red blood cells 8/22. 3. Acute renal failure superimposed on chronic kidney disease stage IV. Resolved, at baseline. 4. Diabetes mellitus. Remains stable. 5. History of upper GI bleed, iron deficiency anemia 2012. EGD, colonoscopy and capsule study at that time. 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 Overall stable, no recurrent bleeding, hemoglobin has remained stable. Discussed with Dr. Gala [name], okay for discharge home, he will coordinate outpatient followup 2-3 weeks, he is considering referral to [facility] for DBE  Consultants:  GI  PT [facility] PT Procedures:  8/18 EGD. No satisfactory explanation for GI bleed.  8/19 transfusion 2 units packed red blood cells.  8/20 capsule study Suspect GI bleeding from distal small bowel ulcers, likely reachable via terminal ileoscopy.  8/21 colonoscopy: ENDOSCOPIC IMPRESSION:  1. NO SOURCE FOR GI BLEED IDENTIFIED. NO OLD BLOOD OR FRESH BLOOD  SEEN N ILEUM OR COLON.  2. TWO COLON POLYPS REMOVED  3. TheTC/[place] colon are redundant  4. Moderate sized internal hemorrhoids  8/22 transfusion one unit packed red blood cells  Discharge Instructions  Discharge Instructions   Diet - low sodium heart healthy    Complete by:  As directed      Diet Carb Modified    Complete by:  As  directed      Discharge instructions    Complete by:  As directed   Call physician or seek immediate medical attention for bleeding, abdominal pain, shortness of breath or worsening of condition.     Increase activity slowly    Complete by:  As directed             Medication List    STOP taking these medications       ciprofloxacin 500 MG tablet  Commonly known as:  CIPRO      TAKE these medications       amLODipine 5 MG tablet  Commonly known as:  NORVASC  Take 7.5 mg by mouth daily.     ferrous sulfate 325 (65 FE) MG tablet  Take 325 mg by mouth daily.     furosemide 40 MG tablet  Commonly known as:  LASIX  Take 20-40 mg by mouth 2 (two) times daily. Patient takes 1 tablet in the morning and 1/2 at night     insulin lispro protamine-lispro (75-25) 100 UNIT/ML Susp injection  Commonly known as:  HUMALOG 75/25 MIX  Inject 30 Units into the skin 2 (two) times daily with a meal.     losartan 50 MG tablet  Commonly known as:  COZAAR  Take 1 tablet by mouth daily.     nebivolol 10 MG tablet  Commonly known as:  BYSTOLIC  Take 10 mg by mouth every morning.     pantoprazole 40 MG tablet  Commonly known as:  PROTONIX  Take 1 tablet (40 mg total) by mouth 2 (two) times daily.     potassium chloride 10 MEQ tablet  Commonly known as:  KLOR-CON M10  Take 1 tablet (10 mEq total) by mouth daily.     tamsulosin 0.4 MG Caps capsule  Commonly known as:  FLOMAX  Take 0.4 mg by mouth daily.     terazosin 10 MG capsule  Commonly known as:  HYTRIN  Take 10 mg by mouth at bedtime.     TRILIPIX 135 MG capsule  Generic drug:  Choline Fenofibrate  Take 135 mg by mouth at bedtime.     Vitamin D 2000 UNITS Caps  Take 1 capsule by mouth daily.       Allergies  Allergen Reactions  . Latex Other (See Comments)    unknown  . Propoxyphene     Other reaction(s): Dizziness  . Sulfa Antibiotics Other (See Comments)    unknown  . Betadine [Povidone Iodine] Rash    The  results of significant diagnostics from this hospitalization (including imaging, microbiology, ancillary and laboratory) are listed below for reference.    Significant Diagnostic Studies: Dg Chest 2 View  02/12/2014   CLINICAL DATA:  Chest pain.  Unable to stand.  EXAM: CHEST  2 VIEW  COMPARISON:  01/27/2014.  FINDINGS: The heart is enlarged. There are no infiltrates or failure. No effusion or pneumothorax. Thoracic atherosclerosis. Similar appearance to priors.  IMPRESSION: No active cardiopulmonary disease.   Electronically Signed   By: Rolla Flatten M.D.   On: 02/12/2014 12:20   Dg Chest Portable 1 View  01/27/2014   CLINICAL DATA:  Generalize weakness.  Shortness of breath.  EXAM: PORTABLE CHEST - 1 VIEW  COMPARISON:  Two-view chest x-ray 06/30/2013, 10/19/2012, 02/18/2011. Portable chest x-ray 10/22/2012.  FINDINGS: Suboptimal inspiration due to body habitus accounts for crowded bronchovascular markings, especially in the bases, and accentuates the cardiac silhouette. Taking this into account, cardiac silhouette mildly to moderately enlarged, unchanged. Thoracic aorta atherosclerotic, unchanged. Hilar and mediastinal contours otherwise unremarkable. Lungs clear. Bronchovascular markings normal. Pulmonary vascularity normal. No visible pleural effusions. No pneumothorax.  IMPRESSION: Suboptimal inspiration. Stable cardiomegaly. No acute cardiopulmonary disease.   Electronically Signed   By: Evangeline Dakin M.D.   On: 01/27/2014 19:39   Labs: Basic Metabolic Panel:  Recent Labs Lab 02/12/14 2353 02/13/14 1044 02/14/14 0627 02/15/14 0840 02/16/14 0822  NA 142 143 147 146 142  K 4.2 4.2 4.4 4.2 4.0  CL 107 109 114* 114* 108  CO2 20 20 21 21 23   GLUCOSE 138* 136* 143* 128* 117*  BUN 75* 74* 63* 45* 32*  CREATININE 4.19* 4.34* 3.67* 3.12* 2.68*  CALCIUM 8.6 8.8 8.5 8.8 8.4   CBC:  Recent Labs Lab 02/12/14 1118  02/16/14 0014 02/16/14 0822 02/17/14 0555 02/18/14 0618 02/19/14 0543    WBC 19.3*  < > 6.0 7.1 6.3 8.4 9.1  NEUTROABS 17.1*  --   --   --   --   --   --   HGB 10.3*  < > 7.6* 8.4* 7.7* 8.0* 7.8*  HCT 32.5*  < > 23.3* 26.4* 24.2* 25.0* 24.3*  MCV 90.8  < > 88.6 88.9 89.6 88.7 88.7  PLT 333  < > 224 250 225 174 277  < > = values in this interval not displayed. Cardiac Enzymes:  Recent Labs Lab 02/12/14 1118  TROPONINI <0.30     Recent Labs  07/10/13 1415 01/27/14 1923 02/12/14 1118  PROBNP 996.8* 2210.0* 507.3*   CBG:  Recent Labs Lab 02/18/14 0724 02/18/14 1114 02/18/14 1616 02/18/14 2052 02/19/14 0731  GLUCAP 123* 150* 119* 148* 133*    Principal Problem:   GI bleed Active Problems:   ANEMIA   HYPERTENSION   GERD   Melena   Weakness generalized   DM type 2 (diabetes mellitus, type 2)   Acute renal failure superimposed on stage 4 chronic kidney disease   Time coordinating discharge: 25 minutes  Signed:  Murray Hodgkins, MD [facility] 02/19/2014, 11:16 AM

## 2014-02-19 NOTE — Progress Notes (Signed)
PROGRESS NOTE  John [name] ENI:[id number] DOB: Jul 04, 1930 DOA: 02/12/2014 PCP: Purvis Kilts, MD  Summary: 80 year old man presented with chest pain, dark-colored stools. Admitted for further evaluation of GI bleed, acute on chronic renal failure, anemia.  Assessment/Plan: 1. GI bleed. Though secondary to small bowel ulcers, distal. Workup including small bowel capsule study and ileal colonoscopy. No recurrent bleeding. Hemoglobin stable.  2. Acute blood loss anemia, symptomatic. Stable. S/p transfusion 2 units PRBC 8/19, 1 unit packed red blood cells 8/22. 3. Acute renal failure superimposed on chronic kidney disease stage IV. Resolved, at baseline. 4. Diabetes mellitus. Remains stable. 5. History of upper GI bleed, iron deficiency anemia 2012. EGD, colonoscopy and capsule study at that time. 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   Overall stable, no recurrent bleeding, hemoglobin has remained stable. Discussed with Dr. Gala Romney, okay for discharge home, he will coordinate outpatient followup 2-3 weeks, he is considering referral to [facility] for DBE  Murray Hodgkins, MD  [facility]  Pager [phone] If 7PM-7AM, please contact night-coverage at [email or web address], password [email or web address] 02/19/2014, 10:59 AM  LOS: 7 days   Consultants:  GI  PT HH PT  Procedures:  8/18 EGD. No satisfactory explanation for GI bleed.  8/19 transfusion 2 units packed red blood cells.  8/20 capsule study Suspect GI bleeding from distal small bowel ulcers, likely reachable via terminal ileoscopy.   8/21 colonoscopy: ENDOSCOPIC IMPRESSION:  1. NO SOURCE FOR GI BLEED IDENTIFIED. NO OLD BLOOD OR FRESH BLOOD  SEEN N ILEUM OR COLON.  2. TWO COLON POLYPS REMOVED  3. TheTC/[place] colon are redundant  4. Moderate sized internal hemorrhoids  8/22 transfusion one unit packed red blood cells  Antibiotics:    HPI/Subjective: Feeling better today. Breathing well. Passing gas. No bleeding.  Objective: Filed Vitals:   02/19/14 0034 02/19/14 0500 02/19/14 0725 02/19/14 1011  BP:  139/52    Pulse:  81    Temp:  98.4 F (36.9 C)    TempSrc:  Oral    Resp:  20    Height:      Weight:  115.304 kg (254 lb 3.2 oz)    SpO2: 93% 95% 94% 91%    Intake/Output Summary (Last 24 hours) at 02/19/14 1059 Last data filed at 02/19/14 0900  Gross per 24 hour  Intake    720 ml  Output    750 ml  Net    -30 ml     Filed Weights   02/15/14 0641 02/18/14 0437 02/19/14 0500  Weight: 111.948 kg (246 lb 12.8 oz) 113.127 kg (249 lb 6.4 oz) 115.304 kg (254 lb 3.2 oz)    Exam:     Afebrile, vital signs are stable. No hypoxia. Ambulating well with physical therapy without hypoxia. Brief hypoxia was noted a coughing fit.  Sitting up in chair. Appears calm and comfortable.  Cardiovascular regular rate and rhythm. No murmur, rub or gallop. No lower extremity edema.  Respiratory clear to auscultation bilaterally. No wheezes, rales or rhonchi. Normal respiratory effort.   Psychiatric. Grossly normal mood and affect. Speech fluent and appropriate.  Abdomen soft, nontender  Data Reviewed:  Capillary blood sugars  stable. Hemoglobin stable 7.8.  Scheduled Meds: . sodium chloride   Intravenous Once  . antiseptic oral rinse  7 mL Mouth Rinse BID  . guaiFENesin  600 mg Oral BID  . insulin aspart  0-15 Units Subcutaneous TID WC  . insulin aspart  0-5 Units Subcutaneous QHS  . insulin aspart protamine- aspart  10  Units Subcutaneous BID WC  . ipratropium-albuterol  3 mL Nebulization TID  . nebivolol  10 mg Oral [id number]  . pantoprazole  40 mg Oral BID AC  . tamsulosin  0.4 mg Oral Daily  . terazosin  10 mg Oral QHS   Continuous Infusions:    Principal Problem:   GI bleed Active Problems:   ANEMIA   HYPERTENSION   GERD   Melena   Weakness generalized   DM type 2 (diabetes mellitus, type 2)   Acute renal failure superimposed on stage 4 chronic kidney disease

## 2014-02-19 NOTE — Evaluation (Signed)
Physical Therapy Evaluation Patient Details Name: John [name] MRN: [id number] DOB: [birth year] Today's Date: 02/19/2014   History of Present Illness  John [name] is a 80 y.o. male history of chronic kidney disease stage III, GERD, history of GI bleeding in the past related to gastric AVMs, was recently admitted to the hospital for chest pain which was felt to be related to GERD. His symptoms improved with Protonix GI cocktail he was discharged home with a prescription for protonix. He reports that for the last 3-4 days he's been having increased dark colored stools and has been feeling generally weak. Shortness of breath on exertion. Describes discomfort/pain in his right chest, right abdomen and periumbilical area. He's not had any vomiting. Denies any nonsteroidal use. Denies any fever, cough. He has noticed decreased urine output. He was evaluated in the emergency room for his son had heme positive stools, acute on chronic renal failure with elevated creatinine and BUN, and a mildly decreased hemoglobin. He'll be admitted to the hospital for further treatment. Since admission, pt has undergoneEGD on 02/13/14, transufison of 2 units on 02/14/14, capsule study on 02/15/14, and colonoscopy on 02/16/14.   Clinical Impression  Pt is an 80 year old male who presents to physical therapy with dx of GI Bleed.  Pt currently on O2 at 2L via [place], though does not report use of O2 at home.  RN entered room and requested O2 assessment during functional mobility skills. Transfer to EOB O2 sats at 92%, after gait of 150 feet O2 sats 91%; O2 reapplied after pt sitting in chair after gait and had a coughing fit with drop in O2 sats to 86%, though after coughing over O2 sats increased back to 94%.  Pt required min assist for bed mobility skills, though throughout rest of evaluation no physical assist was required.  Pt did have to use RW during gait secondary to generalized weakness due to limited mobility since  hospitalization due to multiple procedures.  Recommend pt continue rehab at next venue with [place] services, and educated pt on importance of continued use of RW during gait for balance/safety.  Pt to be d/c from acute PT services.     Follow Up Recommendations Home health PT    Equipment Recommendations  None recommended by PT       Precautions / Restrictions Precautions Precautions: Fall Restrictions Weight Bearing Restrictions: No      Mobility  Bed Mobility Overal bed mobility: Needs Assistance Bed Mobility: Supine to Sit     Supine to sit: Min assist (for trunk)        Transfers Overall transfer level: Needs assistance Equipment used: Rolling walker (2 wheeled) Transfers: Sit to/from [facility] Sit to Stand: Supervision Stand pivot transfers: Supervision          Ambulation/Gait Ambulation/Gait assistance: Supervision;Min guard Ambulation Distance (Feet): 150 Feet Assistive device: Rolling walker (2 wheeled) Gait Pattern/deviations: Step-through pattern;Decreased dorsiflexion - right;Decreased dorsiflexion - left   Gait velocity interpretation: Below normal speed for age/gender       Balance Overall balance assessment: Needs assistance Sitting-balance support: Feet supported;No upper extremity supported Sitting balance-Leahy Scale: Good     Standing balance support: Bilateral upper extremity supported;During functional activity Standing balance-Leahy Scale: Fair                               Pertinent Vitals/Pain Pain Assessment: No/denies pain    Home Living Family/patient  expects to be discharged to:: Private residence Living Arrangements: Alone Available Help at Discharge: Family;Available PRN/intermittently Type of Home: House Home Access: Stairs to enter Entrance Stairs-Rails: None Entrance Stairs-Number of Steps: 2 Home Layout: One level Home Equipment: Walker - 2 wheels;Cane - single point Agricultural consultant)       Prior Function Level of Independence: Independent with assistive device(s);Independent         Comments: Pt reports he has AD, though only uses them as needed.      Hand Dominance   Dominant Hand: Right    Extremity/Trunk Assessment               Lower Extremity Assessment: Generalized weakness         Communication   Communication: No difficulties  Cognition Arousal/Alertness: Awake/alert Behavior During Therapy: WFL for tasks assessed/performed Overall Cognitive Status: Within Functional Limits for tasks assessed                               Assessment/Plan    PT Assessment All further PT needs can be met in the next venue of care  PT Diagnosis Difficulty walking;Generalized weakness   PT Problem List Decreased strength;Decreased activity tolerance;Decreased mobility  PT Treatment Interventions     PT Goals (Current goals can be found in the Care Plan section) Acute Rehab PT Goals PT Goal Formulation: No goals set, d/c therapy     End of Session Equipment Utilized During Treatment: Gait belt Activity Tolerance: Patient limited by fatigue Patient left: in chair;with call bell/phone within reach;with chair alarm set;Other (comment) (MD entered room at end of PT evaluation for assessment)           Time: [phone] PT Time Calculation (min): 23 min   Charges:   PT Evaluation $Initial PT Evaluation Tier I: 1 Procedure     John [name] 02/19/2014, 10:14 AM

## 2014-02-19 NOTE — Progress Notes (Signed)
Patient states understanding of discharge instructions. O2 sat on room air is 95%.

## 2014-02-20 ENCOUNTER — Encounter: Payer: Self-pay | Admitting: Internal Medicine

## 2014-02-20 DIAGNOSIS — I129 Hypertensive chronic kidney disease with stage 1 through stage 4 chronic kidney disease, or unspecified chronic kidney disease: Secondary | ICD-10-CM | POA: Diagnosis not present

## 2014-02-20 DIAGNOSIS — I509 Heart failure, unspecified: Secondary | ICD-10-CM | POA: Diagnosis not present

## 2014-02-20 DIAGNOSIS — Z794 Long term (current) use of insulin: Secondary | ICD-10-CM | POA: Diagnosis not present

## 2014-02-20 DIAGNOSIS — E119 Type 2 diabetes mellitus without complications: Secondary | ICD-10-CM | POA: Diagnosis not present

## 2014-02-20 DIAGNOSIS — N183 Chronic kidney disease, stage 3 unspecified: Secondary | ICD-10-CM | POA: Diagnosis not present

## 2014-02-20 NOTE — Telephone Encounter (Signed)
Pt is aware of OV on 9/25 at 0930 with LSL and the OV with RMR on 9/22 at 10 was cancel (RMR wants pt to follow up with extender).

## 2014-02-21 DIAGNOSIS — N183 Chronic kidney disease, stage 3 unspecified: Secondary | ICD-10-CM | POA: Diagnosis not present

## 2014-02-21 DIAGNOSIS — Z794 Long term (current) use of insulin: Secondary | ICD-10-CM | POA: Diagnosis not present

## 2014-02-21 DIAGNOSIS — E119 Type 2 diabetes mellitus without complications: Secondary | ICD-10-CM | POA: Diagnosis not present

## 2014-02-21 DIAGNOSIS — I129 Hypertensive chronic kidney disease with stage 1 through stage 4 chronic kidney disease, or unspecified chronic kidney disease: Secondary | ICD-10-CM | POA: Diagnosis not present

## 2014-02-21 DIAGNOSIS — I509 Heart failure, unspecified: Secondary | ICD-10-CM | POA: Diagnosis not present

## 2014-02-21 NOTE — Progress Notes (Signed)
UR chart review completed.  

## 2014-02-22 DIAGNOSIS — N183 Chronic kidney disease, stage 3 unspecified: Secondary | ICD-10-CM | POA: Diagnosis not present

## 2014-02-22 DIAGNOSIS — E119 Type 2 diabetes mellitus without complications: Secondary | ICD-10-CM | POA: Diagnosis not present

## 2014-02-22 DIAGNOSIS — I509 Heart failure, unspecified: Secondary | ICD-10-CM | POA: Diagnosis not present

## 2014-02-22 DIAGNOSIS — Z794 Long term (current) use of insulin: Secondary | ICD-10-CM | POA: Diagnosis not present

## 2014-02-22 DIAGNOSIS — I129 Hypertensive chronic kidney disease with stage 1 through stage 4 chronic kidney disease, or unspecified chronic kidney disease: Secondary | ICD-10-CM | POA: Diagnosis not present

## 2014-02-23 DIAGNOSIS — E119 Type 2 diabetes mellitus without complications: Secondary | ICD-10-CM | POA: Diagnosis not present

## 2014-02-23 DIAGNOSIS — I509 Heart failure, unspecified: Secondary | ICD-10-CM | POA: Diagnosis not present

## 2014-02-23 DIAGNOSIS — N183 Chronic kidney disease, stage 3 unspecified: Secondary | ICD-10-CM | POA: Diagnosis not present

## 2014-02-23 DIAGNOSIS — I129 Hypertensive chronic kidney disease with stage 1 through stage 4 chronic kidney disease, or unspecified chronic kidney disease: Secondary | ICD-10-CM | POA: Diagnosis not present

## 2014-02-23 DIAGNOSIS — Z794 Long term (current) use of insulin: Secondary | ICD-10-CM | POA: Diagnosis not present

## 2014-02-26 DIAGNOSIS — N400 Enlarged prostate without lower urinary tract symptoms: Secondary | ICD-10-CM | POA: Diagnosis not present

## 2014-02-26 DIAGNOSIS — D649 Anemia, unspecified: Secondary | ICD-10-CM | POA: Diagnosis not present

## 2014-02-26 DIAGNOSIS — Z794 Long term (current) use of insulin: Secondary | ICD-10-CM | POA: Diagnosis not present

## 2014-02-26 DIAGNOSIS — N184 Chronic kidney disease, stage 4 (severe): Secondary | ICD-10-CM | POA: Diagnosis not present

## 2014-02-26 DIAGNOSIS — Z6837 Body mass index (BMI) 37.0-37.9, adult: Secondary | ICD-10-CM | POA: Diagnosis not present

## 2014-02-26 DIAGNOSIS — K922 Gastrointestinal hemorrhage, unspecified: Secondary | ICD-10-CM | POA: Diagnosis not present

## 2014-02-26 DIAGNOSIS — E119 Type 2 diabetes mellitus without complications: Secondary | ICD-10-CM | POA: Diagnosis not present

## 2014-02-26 DIAGNOSIS — I10 Essential (primary) hypertension: Secondary | ICD-10-CM | POA: Diagnosis not present

## 2014-02-26 DIAGNOSIS — N183 Chronic kidney disease, stage 3 unspecified: Secondary | ICD-10-CM | POA: Diagnosis not present

## 2014-02-26 DIAGNOSIS — I129 Hypertensive chronic kidney disease with stage 1 through stage 4 chronic kidney disease, or unspecified chronic kidney disease: Secondary | ICD-10-CM | POA: Diagnosis not present

## 2014-02-26 DIAGNOSIS — I509 Heart failure, unspecified: Secondary | ICD-10-CM | POA: Diagnosis not present

## 2014-02-27 DIAGNOSIS — E119 Type 2 diabetes mellitus without complications: Secondary | ICD-10-CM | POA: Diagnosis not present

## 2014-02-27 DIAGNOSIS — Z794 Long term (current) use of insulin: Secondary | ICD-10-CM | POA: Diagnosis not present

## 2014-02-27 DIAGNOSIS — I129 Hypertensive chronic kidney disease with stage 1 through stage 4 chronic kidney disease, or unspecified chronic kidney disease: Secondary | ICD-10-CM | POA: Diagnosis not present

## 2014-02-27 DIAGNOSIS — N183 Chronic kidney disease, stage 3 unspecified: Secondary | ICD-10-CM | POA: Diagnosis not present

## 2014-02-27 DIAGNOSIS — I509 Heart failure, unspecified: Secondary | ICD-10-CM | POA: Diagnosis not present

## 2014-02-28 DIAGNOSIS — I509 Heart failure, unspecified: Secondary | ICD-10-CM | POA: Diagnosis not present

## 2014-02-28 DIAGNOSIS — Z794 Long term (current) use of insulin: Secondary | ICD-10-CM | POA: Diagnosis not present

## 2014-02-28 DIAGNOSIS — N183 Chronic kidney disease, stage 3 unspecified: Secondary | ICD-10-CM | POA: Diagnosis not present

## 2014-02-28 DIAGNOSIS — I129 Hypertensive chronic kidney disease with stage 1 through stage 4 chronic kidney disease, or unspecified chronic kidney disease: Secondary | ICD-10-CM | POA: Diagnosis not present

## 2014-02-28 DIAGNOSIS — E119 Type 2 diabetes mellitus without complications: Secondary | ICD-10-CM | POA: Diagnosis not present

## 2014-02-28 NOTE — Telephone Encounter (Signed)
Please call pt. HE had A simple adenoma removed from HIS colon.

## 2014-03-01 DIAGNOSIS — E119 Type 2 diabetes mellitus without complications: Secondary | ICD-10-CM | POA: Diagnosis not present

## 2014-03-01 DIAGNOSIS — I509 Heart failure, unspecified: Secondary | ICD-10-CM | POA: Diagnosis not present

## 2014-03-01 DIAGNOSIS — N183 Chronic kidney disease, stage 3 unspecified: Secondary | ICD-10-CM | POA: Diagnosis not present

## 2014-03-01 DIAGNOSIS — Z794 Long term (current) use of insulin: Secondary | ICD-10-CM | POA: Diagnosis not present

## 2014-03-01 DIAGNOSIS — I129 Hypertensive chronic kidney disease with stage 1 through stage 4 chronic kidney disease, or unspecified chronic kidney disease: Secondary | ICD-10-CM | POA: Diagnosis not present

## 2014-03-02 DIAGNOSIS — N183 Chronic kidney disease, stage 3 unspecified: Secondary | ICD-10-CM | POA: Diagnosis not present

## 2014-03-02 DIAGNOSIS — I509 Heart failure, unspecified: Secondary | ICD-10-CM | POA: Diagnosis not present

## 2014-03-02 DIAGNOSIS — Z794 Long term (current) use of insulin: Secondary | ICD-10-CM | POA: Diagnosis not present

## 2014-03-02 DIAGNOSIS — I129 Hypertensive chronic kidney disease with stage 1 through stage 4 chronic kidney disease, or unspecified chronic kidney disease: Secondary | ICD-10-CM | POA: Diagnosis not present

## 2014-03-02 DIAGNOSIS — E119 Type 2 diabetes mellitus without complications: Secondary | ICD-10-CM | POA: Diagnosis not present

## 2014-03-06 DIAGNOSIS — E119 Type 2 diabetes mellitus without complications: Secondary | ICD-10-CM | POA: Diagnosis not present

## 2014-03-06 DIAGNOSIS — Z794 Long term (current) use of insulin: Secondary | ICD-10-CM | POA: Diagnosis not present

## 2014-03-06 DIAGNOSIS — I509 Heart failure, unspecified: Secondary | ICD-10-CM | POA: Diagnosis not present

## 2014-03-06 DIAGNOSIS — N183 Chronic kidney disease, stage 3 unspecified: Secondary | ICD-10-CM | POA: Diagnosis not present

## 2014-03-06 DIAGNOSIS — I129 Hypertensive chronic kidney disease with stage 1 through stage 4 chronic kidney disease, or unspecified chronic kidney disease: Secondary | ICD-10-CM | POA: Diagnosis not present

## 2014-03-06 NOTE — Telephone Encounter (Signed)
Tried to call pt- LMOM 

## 2014-03-07 DIAGNOSIS — Z794 Long term (current) use of insulin: Secondary | ICD-10-CM | POA: Diagnosis not present

## 2014-03-07 DIAGNOSIS — I129 Hypertensive chronic kidney disease with stage 1 through stage 4 chronic kidney disease, or unspecified chronic kidney disease: Secondary | ICD-10-CM | POA: Diagnosis not present

## 2014-03-07 DIAGNOSIS — E119 Type 2 diabetes mellitus without complications: Secondary | ICD-10-CM | POA: Diagnosis not present

## 2014-03-07 DIAGNOSIS — I509 Heart failure, unspecified: Secondary | ICD-10-CM | POA: Diagnosis not present

## 2014-03-07 DIAGNOSIS — N183 Chronic kidney disease, stage 3 unspecified: Secondary | ICD-10-CM | POA: Diagnosis not present

## 2014-03-08 DIAGNOSIS — I509 Heart failure, unspecified: Secondary | ICD-10-CM | POA: Diagnosis not present

## 2014-03-08 DIAGNOSIS — Z794 Long term (current) use of insulin: Secondary | ICD-10-CM | POA: Diagnosis not present

## 2014-03-08 DIAGNOSIS — N183 Chronic kidney disease, stage 3 unspecified: Secondary | ICD-10-CM | POA: Diagnosis not present

## 2014-03-08 DIAGNOSIS — E119 Type 2 diabetes mellitus without complications: Secondary | ICD-10-CM | POA: Diagnosis not present

## 2014-03-08 DIAGNOSIS — I129 Hypertensive chronic kidney disease with stage 1 through stage 4 chronic kidney disease, or unspecified chronic kidney disease: Secondary | ICD-10-CM | POA: Diagnosis not present

## 2014-03-08 NOTE — Telephone Encounter (Signed)
pts daughter is aware of results and bloodwork orders and ov

## 2014-03-10 ENCOUNTER — Other Ambulatory Visit: Payer: Self-pay | Admitting: Cardiovascular Disease

## 2014-03-13 DIAGNOSIS — E119 Type 2 diabetes mellitus without complications: Secondary | ICD-10-CM | POA: Diagnosis not present

## 2014-03-13 DIAGNOSIS — I129 Hypertensive chronic kidney disease with stage 1 through stage 4 chronic kidney disease, or unspecified chronic kidney disease: Secondary | ICD-10-CM | POA: Diagnosis not present

## 2014-03-13 DIAGNOSIS — N183 Chronic kidney disease, stage 3 unspecified: Secondary | ICD-10-CM | POA: Diagnosis not present

## 2014-03-13 DIAGNOSIS — Z794 Long term (current) use of insulin: Secondary | ICD-10-CM | POA: Diagnosis not present

## 2014-03-13 DIAGNOSIS — I509 Heart failure, unspecified: Secondary | ICD-10-CM | POA: Diagnosis not present

## 2014-03-14 DIAGNOSIS — Z794 Long term (current) use of insulin: Secondary | ICD-10-CM | POA: Diagnosis not present

## 2014-03-14 DIAGNOSIS — E119 Type 2 diabetes mellitus without complications: Secondary | ICD-10-CM | POA: Diagnosis not present

## 2014-03-14 DIAGNOSIS — I129 Hypertensive chronic kidney disease with stage 1 through stage 4 chronic kidney disease, or unspecified chronic kidney disease: Secondary | ICD-10-CM | POA: Diagnosis not present

## 2014-03-14 DIAGNOSIS — N183 Chronic kidney disease, stage 3 unspecified: Secondary | ICD-10-CM | POA: Diagnosis not present

## 2014-03-14 DIAGNOSIS — I509 Heart failure, unspecified: Secondary | ICD-10-CM | POA: Diagnosis not present

## 2014-03-15 DIAGNOSIS — E119 Type 2 diabetes mellitus without complications: Secondary | ICD-10-CM | POA: Diagnosis not present

## 2014-03-15 DIAGNOSIS — I509 Heart failure, unspecified: Secondary | ICD-10-CM | POA: Diagnosis not present

## 2014-03-15 DIAGNOSIS — I129 Hypertensive chronic kidney disease with stage 1 through stage 4 chronic kidney disease, or unspecified chronic kidney disease: Secondary | ICD-10-CM | POA: Diagnosis not present

## 2014-03-15 DIAGNOSIS — Z794 Long term (current) use of insulin: Secondary | ICD-10-CM | POA: Diagnosis not present

## 2014-03-15 DIAGNOSIS — N183 Chronic kidney disease, stage 3 unspecified: Secondary | ICD-10-CM | POA: Diagnosis not present

## 2014-03-20 ENCOUNTER — Ambulatory Visit: Payer: Medicare Other | Admitting: Internal Medicine

## 2014-03-22 ENCOUNTER — Telehealth: Payer: Self-pay | Admitting: Internal Medicine

## 2014-03-22 NOTE — Telephone Encounter (Signed)
Patient wants to know if he has to have his labs drawn before his appointment Monday.  Having trouble with transportation.

## 2014-03-23 ENCOUNTER — Ambulatory Visit: Payer: Medicare Other | Admitting: Gastroenterology

## 2014-03-26 ENCOUNTER — Ambulatory Visit (INDEPENDENT_AMBULATORY_CARE_PROVIDER_SITE_OTHER): Payer: Medicare Other | Admitting: Gastroenterology

## 2014-03-26 ENCOUNTER — Encounter: Payer: Self-pay | Admitting: Gastroenterology

## 2014-03-26 VITALS — BP 150/67 | HR 67 | Temp 97.0°F | Ht 68.0 in | Wt 244.0 lb

## 2014-03-26 DIAGNOSIS — R1319 Other dysphagia: Secondary | ICD-10-CM

## 2014-03-26 DIAGNOSIS — R1310 Dysphagia, unspecified: Secondary | ICD-10-CM

## 2014-03-26 DIAGNOSIS — K921 Melena: Secondary | ICD-10-CM | POA: Diagnosis not present

## 2014-03-26 DIAGNOSIS — R1314 Dysphagia, pharyngoesophageal phase: Secondary | ICD-10-CM

## 2014-03-26 DIAGNOSIS — K5900 Constipation, unspecified: Secondary | ICD-10-CM | POA: Diagnosis not present

## 2014-03-26 DIAGNOSIS — K219 Gastro-esophageal reflux disease without esophagitis: Secondary | ICD-10-CM

## 2014-03-26 NOTE — Patient Instructions (Signed)
1. Increase pantoprazole to twice daily (once before breakfast and once before evening meal) for next four weeks to see if this helps your acid reflux and swallowing. 2. Call in four weeks and let us know if reflux is better. 3. Start a probiotic Librarian, academic or Align). One daily for one month. 4. Please have your labs done as discussed. Let us know when you have them drawn so we can be on look out for them. 5. Call if you have black or bloody stools.

## 2014-03-26 NOTE — Telephone Encounter (Signed)
Pt saw LSL this morning.

## 2014-03-26 NOTE — Progress Notes (Signed)
Primary Care Physician: Purvis Kilts, MD  Primary Gastroenterologist:  Garfield Cornea, MD   Chief Complaint  Patient presents with  . Follow-up    from hospital. doing ok    HPI: John [name] is a 80 y.o. male here for f/u of recent hospitalization for obscure GI bleeding, transfusion-depedent anemia. Capsule with distal SB ulcers but normal TI on following TCS. EGD with fundal gland polyps, pancreatic rest, 2 tiny areas of erosions/ulceration s/p benign biopsy. Patient denies further melena, brbpr. No NSAIDS/ASA. Was having some GERD issues and PCP gave him omeprazole. Patient was already on Nexium. Taking oth every AM. Feels like food sticks in throat area. Some indigestion. No N/V, anorexia, abdominal pain.    Plans to have labs in couple of weeks when nephrologist rechecks his labs.    Current Outpatient Prescriptions  Medication Sig Dispense Refill  . amLODipine (NORVASC) 5 MG tablet Take 7.5 mg by mouth daily.      . Cholecalciferol (VITAMIN D) 2000 UNITS CAPS Take 1 capsule by mouth daily.      . Choline Fenofibrate (TRILIPIX) 135 MG capsule Take 135 mg by mouth at bedtime.       . ferrous sulfate 325 (65 FE) MG tablet Take 325 mg by mouth daily.       . furosemide (LASIX) 40 MG tablet Take 20-40 mg by mouth 2 (two) times daily. Patient takes 1 tablet in the morning and 1/2 at night      . furosemide (LASIX) 40 MG tablet Take 1 tablet (40 mg total) by mouth daily. *Appointment must be made for additional refills*  30 tablet  0  . insulin lispro protamine-lispro (HUMALOG 75/25 MIX) (75-25) 100 UNIT/ML SUSP injection Inject 60 Units into the skin 2 (two) times daily with a meal.      . KLOR-CON M10 10 MEQ tablet TAKE 1 TABLET DAILY (NEEDS APPOINTMENT WITH YOUR      DOCTOR BY 12/2013)  60 tablet  0  . losartan (COZAAR) 50 MG tablet Take 1 tablet by mouth daily.      . nebivolol (BYSTOLIC) 10 MG tablet Take 5 mg by mouth every morning.       . pantoprazole (PROTONIX) 40  MG tablet Take 40 mg by mouth daily.      . tamsulosin (FLOMAX) 0.4 MG CAPS capsule Take 0.4 mg by mouth daily.      Marland Kitchen terazosin (HYTRIN) 10 MG capsule Take 10 mg by mouth at bedtime.       No current facility-administered medications for this visit.    Allergies as of 03/26/2014 - Review Complete 03/26/2014  Allergen Reaction Noted  . Latex Other (See Comments)   . Propoxyphene  01/27/2014  . Sulfa antibiotics Other (See Comments) 01/27/2014  . Betadine [povidone iodine] Rash 02/13/2011    ROS:  General: Negative for anorexia, weight loss, fever, chills, fatigue, weakness. ENT: Negative for hoarseness, difficulty swallowing , nasal congestion. CV: Negative for chest pain, angina, palpitations, dyspnea on exertion, peripheral edema.  Respiratory: Negative for dyspnea at rest, dyspnea on exertion, cough, sputum, wheezing.  GI: See history of present illness. GU:  Negative for dysuria, hematuria, urinary incontinence, urinary frequency, nocturnal urination.  Endo: Negative for unusual weight change.    Physical Examination:   BP 150/67  Pulse 67  Temp(Src) 97 F (36.1 C) (Oral)  Ht 5\' 8"  (1.727 m)  Wt 244 lb (110.678 kg)  BMI 37.11 kg/m2  General: Well-nourished, well-developed  in no acute distress. Accompanied by dgt.  Eyes: No icterus. Mouth: Oropharyngeal mucosa moist and pink , no lesions erythema or exudate. Lungs: Clear to auscultation bilaterally.  Heart: Regular rate and rhythm, no murmurs rubs or gallops.  Abdomen: Bowel sounds are normal, nontender, nondistended, no hepatosplenomegaly or masses, no abdominal bruits or hernia , no rebound or guarding.   Extremities: No lower extremity edema. No clubbing or deformities. Neuro: Alert and oriented x 4   Skin: Warm and dry, no jaundice.   Psych: Alert and cooperative, normal mood and affect.  Labs:     Lab Results  Component Value Date   WBC 9.1 02/19/2014   HGB 7.8* 02/19/2014   HCT 24.3* 02/19/2014   MCV 88.7  02/19/2014   PLT 277 02/19/2014   Lab Results  Component Value Date   CREATININE 2.68* 02/16/2014   BUN 32* 02/16/2014   NA 142 02/16/2014   K 4.0 02/16/2014   CL 108 02/16/2014   CO2 23 02/16/2014   Lab Results  Component Value Date   ALT 21 07/10/2013   AST 27 07/10/2013   ALKPHOS 58 07/10/2013   BILITOT 0.3 07/10/2013      Imaging Studies: No results found.

## 2014-03-28 ENCOUNTER — Encounter: Payer: Self-pay | Admitting: Gastroenterology

## 2014-03-28 DIAGNOSIS — N183 Chronic kidney disease, stage 3 unspecified: Secondary | ICD-10-CM | POA: Diagnosis not present

## 2014-03-28 DIAGNOSIS — Z794 Long term (current) use of insulin: Secondary | ICD-10-CM | POA: Diagnosis not present

## 2014-03-28 DIAGNOSIS — I509 Heart failure, unspecified: Secondary | ICD-10-CM | POA: Diagnosis not present

## 2014-03-28 DIAGNOSIS — I129 Hypertensive chronic kidney disease with stage 1 through stage 4 chronic kidney disease, or unspecified chronic kidney disease: Secondary | ICD-10-CM | POA: Diagnosis not present

## 2014-03-28 DIAGNOSIS — E119 Type 2 diabetes mellitus without complications: Secondary | ICD-10-CM | POA: Diagnosis not present

## 2014-03-28 NOTE — Assessment & Plan Note (Signed)
BM fairly regularly except on occasion. Complains of bloating. Add probiotic for one month. Trial of metamucil for regularity.

## 2014-03-28 NOTE — Assessment & Plan Note (Addendum)
Vague symptoms. No apparent stricture on recent EGD. Increase Nexium to BID for 4 weeks to see if added benefit. Call if no improvement.

## 2014-03-28 NOTE — Assessment & Plan Note (Signed)
Obscure GI bleeding as outlined above. Thought to be distal SB on capsule endoscopy. Normal TI on colonoscopy. Plan for DBE if recurrent bleeding. No NSAIDS/ASA. Plan for repeat H/H. Call if further bleeding.

## 2014-03-28 NOTE — Progress Notes (Signed)
cc'ed to pcp °

## 2014-04-02 DIAGNOSIS — E1140 Type 2 diabetes mellitus with diabetic neuropathy, unspecified: Secondary | ICD-10-CM | POA: Diagnosis not present

## 2014-04-03 ENCOUNTER — Other Ambulatory Visit: Payer: Self-pay | Admitting: Cardiovascular Disease

## 2014-04-04 NOTE — Telephone Encounter (Signed)
Refill refused >> defer to PCP >> per daughter does not see cardiologist anymore

## 2014-04-12 DIAGNOSIS — Z79899 Other long term (current) drug therapy: Secondary | ICD-10-CM | POA: Diagnosis not present

## 2014-04-12 DIAGNOSIS — N183 Chronic kidney disease, stage 3 (moderate): Secondary | ICD-10-CM | POA: Diagnosis not present

## 2014-04-12 DIAGNOSIS — E559 Vitamin D deficiency, unspecified: Secondary | ICD-10-CM | POA: Diagnosis not present

## 2014-04-12 DIAGNOSIS — D649 Anemia, unspecified: Secondary | ICD-10-CM | POA: Diagnosis not present

## 2014-04-12 DIAGNOSIS — R809 Proteinuria, unspecified: Secondary | ICD-10-CM | POA: Diagnosis not present

## 2014-04-12 DIAGNOSIS — I10 Essential (primary) hypertension: Secondary | ICD-10-CM | POA: Diagnosis not present

## 2014-04-17 DIAGNOSIS — N184 Chronic kidney disease, stage 4 (severe): Secondary | ICD-10-CM | POA: Diagnosis not present

## 2014-04-17 DIAGNOSIS — R809 Proteinuria, unspecified: Secondary | ICD-10-CM | POA: Diagnosis not present

## 2014-04-17 DIAGNOSIS — I10 Essential (primary) hypertension: Secondary | ICD-10-CM | POA: Diagnosis not present

## 2014-04-24 DIAGNOSIS — Z6838 Body mass index (BMI) 38.0-38.9, adult: Secondary | ICD-10-CM | POA: Diagnosis not present

## 2014-04-24 DIAGNOSIS — N184 Chronic kidney disease, stage 4 (severe): Secondary | ICD-10-CM | POA: Diagnosis not present

## 2014-04-24 DIAGNOSIS — M1611 Unilateral primary osteoarthritis, right hip: Secondary | ICD-10-CM | POA: Diagnosis not present

## 2014-04-24 DIAGNOSIS — N342 Other urethritis: Secondary | ICD-10-CM | POA: Diagnosis not present

## 2014-04-28 ENCOUNTER — Other Ambulatory Visit: Payer: Self-pay | Admitting: Cardiovascular Disease

## 2014-05-09 ENCOUNTER — Other Ambulatory Visit: Payer: Self-pay | Admitting: Cardiovascular Disease

## 2014-05-09 DIAGNOSIS — H34831 Tributary (branch) retinal vein occlusion, right eye: Secondary | ICD-10-CM | POA: Diagnosis not present

## 2014-05-09 DIAGNOSIS — E119 Type 2 diabetes mellitus without complications: Secondary | ICD-10-CM | POA: Diagnosis not present

## 2014-05-09 DIAGNOSIS — H3531 Nonexudative age-related macular degeneration: Secondary | ICD-10-CM | POA: Diagnosis not present

## 2014-05-09 NOTE — Telephone Encounter (Signed)
K & terazosin refilled Patient aware he needs appointment

## 2014-05-10 ENCOUNTER — Telehealth: Payer: Self-pay | Admitting: Internal Medicine

## 2014-05-10 DIAGNOSIS — D649 Anemia, unspecified: Secondary | ICD-10-CM

## 2014-05-10 NOTE — Telephone Encounter (Signed)
Patient's daughter called and wanted to let LSL know that the align and protonix medications seem to be working and patient is doing better.

## 2014-05-14 ENCOUNTER — Telehealth: Payer: Self-pay | Admitting: Cardiovascular Disease

## 2014-05-15 NOTE — Telephone Encounter (Signed)
Great to hear! Please find out if patient had his labs done.

## 2014-05-17 NOTE — Telephone Encounter (Signed)
Tried to call pts daughter- NA, LM

## 2014-05-18 NOTE — Telephone Encounter (Signed)
Mailed reminder letter and copy of the lab order to the pt.

## 2014-05-22 NOTE — Telephone Encounter (Signed)
Closed encounter °

## 2014-05-23 NOTE — Telephone Encounter (Signed)
Received copy of labs from Dr. Lucillie Garfinkel you dated 15 2015 BUN 33, creatinine 3.16, albumin 3.9, hemoglobin 10.9, hematocrit 34.  H/H must improved.  Would recommend to continue to monitor for melena,brbpr. Repeat CBC in 3 months.

## 2014-05-30 ENCOUNTER — Ambulatory Visit: Payer: Medicare Other | Admitting: Cardiovascular Disease

## 2014-06-06 ENCOUNTER — Telehealth: Payer: Self-pay | Admitting: Internal Medicine

## 2014-06-06 NOTE — Telephone Encounter (Signed)
Patient's daughter called to see if Dr Marquette Saa Rhona Leavens office ([place]) had sent the patient's lab results over to [place]. I haven't seen anything recently off the fax. Do you know anything about this? Do I need to request for them to send them here?

## 2014-06-06 NOTE — Telephone Encounter (Signed)
pts daughter- Lucita Ferrara is aware, he is going back to get more labs with his kidney doctor in January and she said she was going to get them to do a CBC then too and have them send it to [place]. Lab order on file.

## 2014-06-06 NOTE — Telephone Encounter (Signed)
I spoke with Lucita Ferrara, we got everything we need and she is aware.

## 2014-06-11 DIAGNOSIS — E1140 Type 2 diabetes mellitus with diabetic neuropathy, unspecified: Secondary | ICD-10-CM | POA: Diagnosis not present

## 2014-06-11 NOTE — Addendum Note (Signed)
Addended by: Claudina Lick on: 06/11/2014 09:08 AM   Modules accepted: Orders

## 2014-06-14 DIAGNOSIS — I10 Essential (primary) hypertension: Secondary | ICD-10-CM | POA: Diagnosis not present

## 2014-06-14 DIAGNOSIS — E780 Pure hypercholesterolemia: Secondary | ICD-10-CM | POA: Diagnosis not present

## 2014-06-14 DIAGNOSIS — R809 Proteinuria, unspecified: Secondary | ICD-10-CM | POA: Diagnosis not present

## 2014-06-14 DIAGNOSIS — E1165 Type 2 diabetes mellitus with hyperglycemia: Secondary | ICD-10-CM | POA: Diagnosis not present

## 2014-06-16 ENCOUNTER — Other Ambulatory Visit: Payer: Self-pay | Admitting: Cardiovascular Disease

## 2014-06-18 NOTE — Telephone Encounter (Signed)
Rx(s) sent to pharmacy electronically.  

## 2014-07-02 DIAGNOSIS — N183 Chronic kidney disease, stage 3 (moderate): Secondary | ICD-10-CM | POA: Diagnosis not present

## 2014-07-02 DIAGNOSIS — R809 Proteinuria, unspecified: Secondary | ICD-10-CM | POA: Diagnosis not present

## 2014-07-02 DIAGNOSIS — E559 Vitamin D deficiency, unspecified: Secondary | ICD-10-CM | POA: Diagnosis not present

## 2014-07-02 DIAGNOSIS — Z79899 Other long term (current) drug therapy: Secondary | ICD-10-CM | POA: Diagnosis not present

## 2014-07-02 DIAGNOSIS — D649 Anemia, unspecified: Secondary | ICD-10-CM | POA: Diagnosis not present

## 2014-07-02 DIAGNOSIS — I10 Essential (primary) hypertension: Secondary | ICD-10-CM | POA: Diagnosis not present

## 2014-07-04 DIAGNOSIS — I10 Essential (primary) hypertension: Secondary | ICD-10-CM | POA: Diagnosis not present

## 2014-07-04 DIAGNOSIS — N184 Chronic kidney disease, stage 4 (severe): Secondary | ICD-10-CM | POA: Diagnosis not present

## 2014-07-04 DIAGNOSIS — D638 Anemia in other chronic diseases classified elsewhere: Secondary | ICD-10-CM | POA: Diagnosis not present

## 2014-07-04 DIAGNOSIS — R809 Proteinuria, unspecified: Secondary | ICD-10-CM | POA: Diagnosis not present

## 2014-07-08 ENCOUNTER — Other Ambulatory Visit: Payer: Self-pay | Admitting: Cardiovascular Disease

## 2014-07-12 DIAGNOSIS — H53419 Scotoma involving central area, unspecified eye: Secondary | ICD-10-CM | POA: Diagnosis not present

## 2014-07-12 DIAGNOSIS — H5372 Impaired contrast sensitivity: Secondary | ICD-10-CM | POA: Diagnosis not present

## 2014-07-12 NOTE — Telephone Encounter (Signed)
Rx(s) sent to pharmacy electronically.  

## 2014-07-20 ENCOUNTER — Other Ambulatory Visit: Payer: Self-pay | Admitting: Cardiovascular Disease

## 2014-07-22 NOTE — Telephone Encounter (Signed)
Rx(s) sent to pharmacy electronically. Message sent to Dr. Kelly's scheduler to contact patient for appointment 

## 2014-07-26 ENCOUNTER — Telehealth: Payer: Self-pay | Admitting: Cardiovascular Disease

## 2014-07-26 NOTE — Telephone Encounter (Signed)
John [name] was calling in to report  duplicate medications between the Terazosin(which was prescribed by Dr. Claiborne Billings) and Tamsulosin  Which was prescribed by another physician. Please follow up  Thanks

## 2014-07-26 NOTE — Telephone Encounter (Signed)
Spoke with [facility], referred them to patients PCP dr Hilma Favors for both these medicines. The patient has not been seen here and they are on his medicine list from discharge for GI bleed.

## 2014-07-27 DIAGNOSIS — H5372 Impaired contrast sensitivity: Secondary | ICD-10-CM | POA: Diagnosis not present

## 2014-07-27 DIAGNOSIS — H53419 Scotoma involving central area, unspecified eye: Secondary | ICD-10-CM | POA: Diagnosis not present

## 2014-08-20 ENCOUNTER — Other Ambulatory Visit: Payer: Self-pay

## 2014-08-20 DIAGNOSIS — D649 Anemia, unspecified: Secondary | ICD-10-CM

## 2014-08-27 DIAGNOSIS — E1140 Type 2 diabetes mellitus with diabetic neuropathy, unspecified: Secondary | ICD-10-CM | POA: Diagnosis not present

## 2014-09-18 ENCOUNTER — Encounter: Payer: Self-pay | Admitting: *Deleted

## 2014-09-18 ENCOUNTER — Encounter (HOSPITAL_COMMUNITY): Payer: Self-pay | Admitting: *Deleted

## 2014-09-18 ENCOUNTER — Emergency Department (HOSPITAL_COMMUNITY): Payer: Medicare Other

## 2014-09-18 ENCOUNTER — Emergency Department (HOSPITAL_COMMUNITY)
Admission: EM | Admit: 2014-09-18 | Discharge: 2014-09-18 | Disposition: A | Discharge status: Home / Self Care | Payer: Medicare Other | Attending: Emergency Medicine | Admitting: Emergency Medicine

## 2014-09-18 DIAGNOSIS — Z8669 Personal history of other diseases of the nervous system and sense organs: Secondary | ICD-10-CM | POA: Diagnosis not present

## 2014-09-18 DIAGNOSIS — R140 Abdominal distension (gaseous): Secondary | ICD-10-CM | POA: Insufficient documentation

## 2014-09-18 DIAGNOSIS — H547 Unspecified visual loss: Secondary | ICD-10-CM | POA: Diagnosis not present

## 2014-09-18 DIAGNOSIS — R079 Chest pain, unspecified: Secondary | ICD-10-CM

## 2014-09-18 DIAGNOSIS — E669 Obesity, unspecified: Secondary | ICD-10-CM | POA: Diagnosis not present

## 2014-09-18 DIAGNOSIS — E785 Hyperlipidemia, unspecified: Secondary | ICD-10-CM | POA: Diagnosis not present

## 2014-09-18 DIAGNOSIS — R630 Anorexia: Secondary | ICD-10-CM | POA: Diagnosis not present

## 2014-09-18 DIAGNOSIS — Z8719 Personal history of other diseases of the digestive system: Secondary | ICD-10-CM | POA: Insufficient documentation

## 2014-09-18 DIAGNOSIS — R5383 Other fatigue: Secondary | ICD-10-CM | POA: Diagnosis not present

## 2014-09-18 DIAGNOSIS — Z8673 Personal history of transient ischemic attack (TIA), and cerebral infarction without residual deficits: Secondary | ICD-10-CM | POA: Insufficient documentation

## 2014-09-18 DIAGNOSIS — Z8701 Personal history of pneumonia (recurrent): Secondary | ICD-10-CM | POA: Diagnosis not present

## 2014-09-18 DIAGNOSIS — Z87442 Personal history of urinary calculi: Secondary | ICD-10-CM | POA: Insufficient documentation

## 2014-09-18 DIAGNOSIS — R197 Diarrhea, unspecified: Secondary | ICD-10-CM | POA: Diagnosis not present

## 2014-09-18 DIAGNOSIS — R0789 Other chest pain: Secondary | ICD-10-CM | POA: Diagnosis not present

## 2014-09-18 DIAGNOSIS — I129 Hypertensive chronic kidney disease with stage 1 through stage 4 chronic kidney disease, or unspecified chronic kidney disease: Secondary | ICD-10-CM | POA: Insufficient documentation

## 2014-09-18 DIAGNOSIS — Z86018 Personal history of other benign neoplasm: Secondary | ICD-10-CM | POA: Diagnosis not present

## 2014-09-18 DIAGNOSIS — Z794 Long term (current) use of insulin: Secondary | ICD-10-CM | POA: Insufficient documentation

## 2014-09-18 DIAGNOSIS — N189 Chronic kidney disease, unspecified: Secondary | ICD-10-CM | POA: Insufficient documentation

## 2014-09-18 DIAGNOSIS — Z79899 Other long term (current) drug therapy: Secondary | ICD-10-CM | POA: Insufficient documentation

## 2014-09-18 DIAGNOSIS — E119 Type 2 diabetes mellitus without complications: Secondary | ICD-10-CM | POA: Insufficient documentation

## 2014-09-18 DIAGNOSIS — R110 Nausea: Secondary | ICD-10-CM | POA: Insufficient documentation

## 2014-09-18 LAB — TROPONIN I

## 2014-09-18 LAB — URINALYSIS, ROUTINE W REFLEX MICROSCOPIC
Bilirubin Urine: NEGATIVE
Glucose, UA: NEGATIVE mg/dL
Ketones, ur: NEGATIVE mg/dL
LEUKOCYTES UA: NEGATIVE
Nitrite: NEGATIVE
PROTEIN: 30 mg/dL — AB
Specific Gravity, Urine: 1.025 (ref 1.005–1.030)
UROBILINOGEN UA: 0.2 mg/dL (ref 0.0–1.0)
pH: 5.5 (ref 5.0–8.0)

## 2014-09-18 LAB — COMPREHENSIVE METABOLIC PANEL
ALBUMIN: 3.7 g/dL (ref 3.5–5.2)
ALK PHOS: 49 U/L (ref 39–117)
ALT: 16 U/L (ref 0–53)
AST: 20 U/L (ref 0–37)
Anion gap: 9 (ref 5–15)
BUN: 46 mg/dL — ABNORMAL HIGH (ref 6–23)
CHLORIDE: 105 mmol/L (ref 96–112)
CO2: 25 mmol/L (ref 19–32)
CREATININE: 3.45 mg/dL — AB (ref 0.50–1.35)
Calcium: 9.1 mg/dL (ref 8.4–10.5)
GFR calc Af Amer: 18 mL/min — ABNORMAL LOW (ref >90)
GFR, EST NON AFRICAN AMERICAN: 15 mL/min — AB (ref >90)
Glucose, Bld: 187 mg/dL — ABNORMAL HIGH (ref 70–99)
POTASSIUM: 3.6 mmol/L (ref 3.5–5.1)
Sodium: 139 mmol/L (ref 135–145)
Total Bilirubin: 0.3 mg/dL (ref 0.3–1.2)
Total Protein: 7.4 g/dL (ref 6.0–8.3)

## 2014-09-18 LAB — CBC WITH DIFFERENTIAL/PLATELET
Basophils Absolute: 0 10*3/uL (ref 0.0–0.1)
Basophils Relative: 0 % (ref 0–1)
Eosinophils Absolute: 0.4 10*3/uL (ref 0.0–0.7)
Eosinophils Relative: 4 % (ref 0–5)
HEMATOCRIT: 37.1 % — AB (ref 39.0–52.0)
HEMOGLOBIN: 12 g/dL — AB (ref 13.0–17.0)
LYMPHS PCT: 23 % (ref 12–46)
Lymphs Abs: 2.6 10*3/uL (ref 0.7–4.0)
MCH: 28.5 pg (ref 26.0–34.0)
MCHC: 32.3 g/dL (ref 30.0–36.0)
MCV: 88.1 fL (ref 78.0–100.0)
MONO ABS: 0.9 10*3/uL (ref 0.1–1.0)
MONOS PCT: 8 % (ref 3–12)
NEUTROS ABS: 7.4 10*3/uL (ref 1.7–7.7)
Neutrophils Relative %: 65 % (ref 43–77)
Platelets: 372 10*3/uL (ref 150–400)
RBC: 4.21 MIL/uL — ABNORMAL LOW (ref 4.22–5.81)
RDW: 14.2 % (ref 11.5–15.5)
WBC: 11.3 10*3/uL — AB (ref 4.0–10.5)

## 2014-09-18 LAB — URINE MICROSCOPIC-ADD ON

## 2014-09-18 LAB — LIPASE, BLOOD: LIPASE: 39 U/L (ref 11–59)

## 2014-09-18 MED ORDER — ONDANSETRON 4 MG PO TBDP: 4.0000 mg | ORAL_TABLET | Freq: Three times a day (TID) | ORAL | Status: DC | PRN

## 2014-09-18 MED ORDER — ONDANSETRON HCL 4 MG/2ML IJ SOLN
4.0000 mg | Freq: Once | INTRAMUSCULAR | Status: AC
  Administered 2014-09-18: 4 mg via INTRAVENOUS
  Filled 2014-09-18: qty 2

## 2014-09-18 NOTE — ED Notes (Signed)
Nausea, headache, ? Diarrhea.  abd pain,  Feels abd is swollen.  Feels sob

## 2014-09-18 NOTE — Patient Outreach (Signed)
Noted Mr. John [name]'s presentation to ED. Mr. John [name] is followed in the community by [facility] CM. I will follow his progress closely and contact him after discharge from ED or hospitalization.

## 2014-09-18 NOTE — ED Provider Notes (Signed)
TIME SEEN: 3:45 PM   This chart was scribed for [place], DO by Zola Button, ED Scribe. This patient was seen in room APA03/APA03 and the patient's care was started at 3:45 PM.   CHIEF COMPLAINT: Nausea  HPI:  HPI Comments: John [name] is a 81 y.o. male with history of hypertension, diabetes, hyperlipidemia, prior stroke, chronic right bundle branch block, chronic kidney disease who presents to the Emergency Department complaining of gradual onset nausea that started 2 weeks ago. Patient reports having fatigue, decreased appetite, subjective fever a few days ago, and abdominal distention. He also reports having mild tight chest pain, dry cough and labored breathing, but these are unchanged from baseline - states he has this every day for years. Patient denies vomiting, diarrhea, and abdominal pain. His last bowel movement was yesterday. States he is passing gas normally. Patient used to work in a cigarette factory, but he denies hx of smoking. He lives by himself, but a home health nurse that comes to his house. He has been told he needs dialysis, but he has declined dialysis.   ROS: See HPI Constitutional:  Subjective fever  Eyes: no drainage  ENT: no runny nose   Cardiovascular:  Chronic chest pain  Resp: Chronic cough  GI: no vomiting GU: no dysuria Integumentary: no rash  Allergy: no hives  Musculoskeletal: no leg swelling  Neurological: no slurred speech ROS otherwise negative  PAST MEDICAL HISTORY/PAST SURGICAL HISTORY:  Past Medical History  Diagnosis Date  . GI bleed     2006, TCS/TI showed small polyp. EGD showed erosive antral gastritis with two polyp, one oozing and tx. HP neg. SB capsule shoed few jejunal ulcers with bleeding (naproxen and ASA)  . GI bleed     02/2010, EGD->pancreatitc rest, fundal gland polyps,  no bleeding. TCS->blood-tinged colonic effluent throughout the colon without bleeding lesion found, nl TI. SB capsule->SB erosions, nonbleeding, incomplete  study. Patient on naproxyn.   . DM (diabetes mellitus)   . Pneumonia   . Hyperlipidemia   . HTN (hypertension)   . Stroke   . GERD (gastroesophageal reflux disease)   . Poor vision     left eye  . Sleep apnea   . Obesity   . Ischemic colitis, enteritis, or enterocolitis     flex sig 01/2010  . Gastric AVM 10/24/10    GI bleed EGD w/ bicap and hemoclip placement, 2 AVMs, presented w/bright red rectal bleeding  . GI bleed 10/27/10    ileocolonoscopy/GIVENS capsule study by Dr Rourk->bloody colonic effluent throughout colon but no lesion identified, TA @ hepatic flexure, fresh blood in dital SB on capsule. Nuc med RBC study negative  . Tubular adenoma of colon 10/27/10    on colonoscopy Dr Gala Romney  . Seborrheic keratoses      back and chest  . Bundle branch block, right   . Hypoxia     requiring oxygen while in hospital  . Nephrolithiasis   . Chronic renal insufficiency   . Kidney disease     MEDICATIONS:  Prior to Admission medications   Medication Sig Start Date End Date Taking? Authorizing Provider  amLODipine (NORVASC) 5 MG tablet Take 7.5 mg by mouth daily.    Historical Provider, MD  Cholecalciferol (VITAMIN D) 2000 UNITS CAPS Take 1 capsule by mouth daily.    Historical Provider, MD  Choline Fenofibrate (TRILIPIX) 135 MG capsule Take 135 mg by mouth at bedtime.     Historical Provider, MD  ferrous sulfate  325 (65 FE) MG tablet Take 325 mg by mouth daily.     Historical Provider, MD  furosemide (LASIX) 40 MG tablet Take 1 tablet (40 mg total) by mouth in the morning and 0.5 tablet (20 mg total) by mouth in the evening. NEED APPOINTMENT FOR FUTURE REFILL 06/18/14   Troy Sine, MD  insulin lispro protamine-lispro (HUMALOG 75/25 MIX) (75-25) 100 UNIT/ML SUSP injection Inject 60 Units into the skin 2 (two) times daily with a meal. 01/29/14   Kathie Dike, MD  losartan (COZAAR) 50 MG tablet Take 1 tablet by mouth daily. 11/23/13   Historical Provider, MD  nebivolol (BYSTOLIC) 10 MG  tablet Take 5 mg by mouth every morning.     Historical Provider, MD  pantoprazole (PROTONIX) 40 MG tablet Take 40 mg by mouth daily. 02/19/14   Samuella Cota, MD  potassium chloride (KLOR-CON M10) 10 MEQ tablet Take 1 tablet (10 mEq total) by mouth daily. <please make appointment for refills> 05/09/14   Troy Sine, MD  tamsulosin (FLOMAX) 0.4 MG CAPS capsule Take 0.4 mg by mouth daily.    Historical Provider, MD  terazosin (HYTRIN) 10 MG capsule Take 10 mg by mouth at bedtime.    Historical Provider, MD  terazosin (HYTRIN) 5 MG capsule Take 1 capsule (5 mg total) by mouth daily. <please make appointment for refills> 05/09/14   Troy Sine, MD  terazosin (HYTRIN) 5 MG capsule Take 1 capsule (5 mg total) by mouth daily. 07/22/14   Troy Sine, MD    ALLERGIES:  Allergies  Allergen Reactions  . Latex Other (See Comments)    unknown  . Propoxyphene     Other reaction(s): Dizziness  . Sulfa Antibiotics Other (See Comments)    unknown  . Betadine [Povidone Iodine] Rash    SOCIAL HISTORY:  History  Substance Use Topics  . Smoking status: Never Smoker   . Smokeless tobacco: Never Used  . Alcohol Use: No    FAMILY HISTORY: Family History  Problem Relation Age of Onset  . Aneurysm Daughter     brain, deceased age 24  . GI problems Brother     gi bleed  . Cancer Sister     unknown type  . Colon cancer Neg Hx   . Liver disease Neg Hx     EXAM: BP 152/65 mmHg  Pulse 68  Temp(Src) 97.6 F (36.4 C) (Oral)  Resp 22  Ht 5\' 7"  (1.702 m)  Wt 243 lb (110.224 kg)  BMI 38.05 kg/m2  SpO2 97% CONSTITUTIONAL: Alert and oriented and responds appropriately to questions. Well-appearing; well-nourished; elderly, obese but in no distress HEAD: Normocephalic EYES: Conjunctivae clear, PERRL ENT: normal nose; no rhinorrhea; moist mucous membranes; pharynx without lesions noted NECK: Supple, no meningismus, no LAD  CARD: RRR; S1 and S2 appreciated; no murmurs, no clicks, no rubs,  no gallops RESP: Normal chest excursion without splinting or tachypnea; breath sounds clear and equal bilaterally; no wheezes, no rhonchi, no rales, no hypoxia or respiratory distress ABD/GI: Normal bowel sounds; non-distended; obese, soft, non-tender, no rebound, no guarding; No tympany or fluid wave; no peritoneal signs BACK:  The back appears normal and is non-tender to palpation, there is no CVA tenderness EXT: Normal ROM in all joints; non-tender to palpation; no edema; normal capillary refill; no cyanosis    SKIN: Normal color for age and race; warm NEURO: Moves all extremities equally, no facial droop or slurred speech, normal gait PSYCH: The patient's mood and  manner are appropriate. Grooming and personal hygiene are appropriate.  MEDICAL DECISION MAKING: Patient here with multiple vague complaints. He complains of 2 weeks of nausea, decreased appetite and abdominal distention. States he is having normal bowel movements and passing gas. No vomiting. States he has taken an anti-medic at home with relief. Reports he attempted to see his primary care physician and had an appointment scheduled for tomorrow but his friend was concerned and brought him to the emergency department. He has chronic chest pain and shortness of breath which is unchanged as well as a dry cough. His exam is benign including a benign abdominal exam without tympany or fluid wave. He is requesting a "full workup" today. He is hemodynamically stable, nontoxic appearing, appears well-hydrated. We'll give Zofran for nausea. Will obtain labs, urine, EKG, chest and abdominal x-rays.  ED PROGRESS: Pt's labs unremarkable other than mild leukocytosis without left shift.  Cr is 3.45 which is chronic.  Urine shows trace Hb and protein but no other sign of infection.  Troponin negative.  CXR and AXR are negative.  Pt able to tolerate po without V or difficulty.  Now reports some difficulty swallowing intermittently and has had esophageal  strictures in past.  No difficulty swallowing in ED, no drooling, handling secretions.  Will give GI follow information as an outpatient. Have discussed with patient I recommend following with his primary care physician. He states he has nausea medicine at home. Discussed return precautions. Patient and friend at bedside verbalize understanding and are comfortable with plan.      EKG Interpretation  Date/Time:  Tuesday September 18 2014 16:02:04 EDT Ventricular Rate:  69 PR Interval:  158 QRS Duration: 170 QT Interval:  435 QTC Calculation: 466 R Axis:   86 Text Interpretation:  Sinus rhythm Right bundle branch block Baseline wander in lead(s) I III aVL No significant change since last tracing Confirmed by Fahim Kats,  DO, Coraline Talwar [id number]) on 09/18/2014 4:07:26 PM        I personally performed the services described in this documentation, which was scribed in my presence. The recorded information has been reviewed and is accurate.   [place], DO 09/18/14 1755

## 2014-09-18 NOTE — ED Notes (Signed)
Pt tolerated gingerale without problems.  Denies any complaints at this time.

## 2014-09-18 NOTE — Discharge Instructions (Signed)
Nausea, Adult Nausea is the feeling that you have an upset stomach or have to vomit. Nausea by itself is not likely a serious concern, but it may be an early sign of more serious medical problems. As nausea gets worse, it can lead to vomiting. If vomiting develops, there is the risk of dehydration.  CAUSES   Viral infections.  Food poisoning.  Medicines.  Pregnancy.  Motion sickness.  Migraine headaches.  Emotional distress.  Severe pain from any source.  Alcohol intoxication. HOME CARE INSTRUCTIONS  Get plenty of rest.  Ask your caregiver about specific rehydration instructions.  Eat small amounts of food and sip liquids more often.  Take all medicines as told by your caregiver. SEEK MEDICAL CARE IF:  You have not improved after 2 days, or you get worse.  You have a headache. SEEK IMMEDIATE MEDICAL CARE IF:   You have a fever.  You faint.  You keep vomiting or have blood in your vomit.  You are extremely weak or dehydrated.  You have dark or bloody stools.  You have severe chest or abdominal pain. MAKE SURE YOU:  Understand these instructions.  Will watch your condition.  Will get help right away if you are not doing well or get worse. Document Released: 07/23/2004 Document Revised: 03/09/2012 Document Reviewed: 02/25/2011 [facility] Patient Information 2015 [place], [place]. This information is not intended to replace advice given to you by your health care provider. Make sure you discuss any questions you have with your health care provider.  Chest Pain (Nonspecific) It is often hard to give a specific diagnosis for the cause of chest pain. There is always a chance that your pain could be related to something serious, such as a heart attack or a blood clot in the lungs. You need to follow up with your health care provider for further evaluation. CAUSES   Heartburn.  Pneumonia or bronchitis.  Anxiety or stress.  Inflammation around your heart  (pericarditis) or lung (pleuritis or pleurisy).  A blood clot in the lung.  A collapsed lung (pneumothorax). It can develop suddenly on its own (spontaneous pneumothorax) or from trauma to the chest.  Shingles infection (herpes zoster virus). The chest wall is composed of bones, muscles, and cartilage. Any of these can be the source of the pain.  The bones can be bruised by injury.  The muscles or cartilage can be strained by coughing or overwork.  The cartilage can be affected by inflammation and become sore (costochondritis). DIAGNOSIS  Lab tests or other studies may be needed to find the cause of your pain. Your health care provider may have you take a test called an ambulatory electrocardiogram (ECG). An ECG records your heartbeat patterns over a 24-hour period. You may also have other tests, such as:  Transthoracic echocardiogram (TTE). During echocardiography, sound waves are used to evaluate how blood flows through your heart.  Transesophageal echocardiogram (TEE).  Cardiac monitoring. This allows your health care provider to monitor your heart rate and rhythm in real time.  Holter monitor. This is a portable device that records your heartbeat and can help diagnose heart arrhythmias. It allows your health care provider to track your heart activity for several days, if needed.  Stress tests by exercise or by giving medicine that makes the heart beat faster. TREATMENT   Treatment depends on what may be causing your chest pain. Treatment may include:  Acid blockers for heartburn.  Anti-inflammatory medicine.  Pain medicine for inflammatory conditions.  Antibiotics if  an infection is present.  You may be advised to change lifestyle habits. This includes stopping smoking and avoiding alcohol, caffeine, and chocolate.  You may be advised to keep your head raised (elevated) when sleeping. This reduces the chance of acid going backward from your stomach into your  esophagus. Most of the time, nonspecific chest pain will improve within 2-3 days with rest and mild pain medicine.  HOME CARE INSTRUCTIONS   If antibiotics were prescribed, take them as directed. Finish them even if you start to feel better.  For the next few days, avoid physical activities that bring on chest pain. Continue physical activities as directed.  Do not use any tobacco products, including cigarettes, chewing tobacco, or electronic cigarettes.  Avoid drinking alcohol.  Only take medicine as directed by your health care provider.  Follow your health care provider's suggestions for further testing if your chest pain does not go away.  Keep any follow-up appointments you made. If you do not go to an appointment, you could develop lasting (chronic) problems with pain. If there is any problem keeping an appointment, call to reschedule. SEEK MEDICAL CARE IF:   Your chest pain does not go away, even after treatment.  You have a rash with blisters on your chest.  You have a fever. SEEK IMMEDIATE MEDICAL CARE IF:   You have increased chest pain or pain that spreads to your arm, neck, jaw, back, or abdomen.  You have shortness of breath.  You have an increasing cough, or you cough up blood.  You have severe back or abdominal pain.  You feel nauseous or vomit.  You have severe weakness.  You faint.  You have chills. This is an emergency. Do not wait to see if the pain will go away. Get medical help at once. Call your local emergency services (911 in [place] Do not drive yourself to the hospital. MAKE SURE YOU:   Understand these instructions.  Will watch your condition.  Will get help right away if you are not doing well or get worse. Document Released: 03/25/2005 Document Revised: 06/20/2013 Document Reviewed: 01/19/2008 [facility] Patient Information 2015 [place], [place]. This information is not intended to replace advice given to you by your health care provider.  Make sure you discuss any questions you have with your health care provider.

## 2014-09-20 ENCOUNTER — Other Ambulatory Visit: Payer: Self-pay | Admitting: Cardiovascular Disease

## 2014-09-20 NOTE — Telephone Encounter (Signed)
Rx(s) sent to pharmacy electronically.  

## 2014-09-27 ENCOUNTER — Other Ambulatory Visit: Payer: Self-pay | Admitting: *Deleted

## 2014-09-27 NOTE — Patient Instructions (Addendum)
Review Nature conservation officer provided by your [facility] Nurse:   DIABETES DIET - TYPE 2   DIABETES - PREVENTING HEART ATTACK AND STROKE   DIABETES - WHEN YOU ARE SICK   DIABETES DIET - TYPE 2

## 2014-09-27 NOTE — Patient Outreach (Signed)
[place] [facility]) Care Management   09/27/2014  Ashaad Gaertner Sjogren 1930-08-23 [id number]  Moiz Ryant Georgiades is an 81 y.o. male  Subjective:  "I just can't get my strength up. I've been sick since January. I went to the emergency room and that didn't do me a bit of good."  Objective:    BP 118/60 mmHg  Pulse 77  Ht 1.727 m (5\' 8" )  Wt 239 lb 4 oz (108.523 kg)  BMI 36.39 kg/m2  SpO2 93%   Review of Systems  Constitutional: Positive for malaise/fatigue.  HENT: Positive for congestion.   Eyes: Negative.   Respiratory: Positive for cough and sputum production.   Cardiovascular: Negative.   Gastrointestinal: Positive for nausea.  Genitourinary: Negative.   Musculoskeletal: Positive for myalgias and joint pain.  Skin: Negative.   Neurological: Positive for weakness.  Endo/Heme/Allergies: Negative.   Psychiatric/Behavioral: Negative.     Physical Exam  Constitutional: He is oriented to person, place, and time. Vital signs are normal. He appears well-developed and well-nourished.  Cardiovascular: Normal rate, regular rhythm and normal heart sounds.   Respiratory: Effort normal.  GI: Soft. Bowel sounds are normal.  Musculoskeletal: Normal range of motion.  Neurological: He is alert and oriented to person, place, and time.  Skin: Skin is warm and dry.  Psychiatric: His speech is normal and behavior is normal. Judgment and thought content normal. His affect is blunt. Cognition and memory are normal.    Current Medications:   Current Outpatient Prescriptions  Medication Sig Dispense Refill  . acetaminophen (TYLENOL) 325 MG tablet Take 650 mg by mouth every 6 (six) hours as needed for mild pain or headache (takes as needed; not daily).    Marland Kitchen amLODipine (NORVASC) 5 MG tablet Take 7.5 mg by mouth daily.    . Cholecalciferol (VITAMIN D) 2000 UNITS CAPS Take 1 capsule by mouth daily.    . Choline Fenofibrate (TRILIPIX) 135 MG capsule Take 135 mg by mouth at bedtime.     .  ferrous sulfate 325 (65 FE) MG tablet Take 325 mg by mouth daily.     . furosemide (LASIX) 40 MG tablet Take 1 tablet (40 mg total) by mouth in the morning and 0.5 tablet (20 mg total) by mouth in the evening. NEED APPOINTMENT FOR FUTURE REFILL 45 tablet 0  . HYDROcodone-acetaminophen (NORCO) 10-325 MG per tablet Take 1 tablet by mouth every 6 (six) hours as needed for severe pain (has not taken "in as long as I can remember").    . insulin lispro protamine-lispro (HUMALOG 75/25 MIX) (75-25) 100 UNIT/ML SUSP injection Inject 60 Units into the skin 2 (two) times daily with a meal.    . losartan (COZAAR) 50 MG tablet Take 1 tablet by mouth daily.    . meclizine (ANTIVERT) 25 MG tablet Take 25 mg by mouth 3 (three) times daily as needed for dizziness.    . Misc Natural Products (OSTEO BI-FLEX ADV JOINT SHIELD PO) Take 2 capsules by mouth 2 (two) times daily. Has on hand but says he hasn't been taking regularly.    . nebivolol (BYSTOLIC) 10 MG tablet Take 5 mg by mouth every morning.     . potassium chloride (KLOR-CON M10) 10 MEQ tablet Take 1 tablet (10 mEq total) by mouth daily. <please make appointment for refills> 90 tablet 0  . tamsulosin (FLOMAX) 0.4 MG CAPS capsule Take 0.4 mg by mouth daily.    Marland Kitchen terazosin (HYTRIN) 10 MG capsule Take 10 mg by mouth  at bedtime.    . furosemide (LASIX) 40 MG tablet Take 1 tablet by mouth every morning & 1/2 tablet every evening. <PLEASE MAKE APPOINTMENT> (Patient not taking: Reported on 09/27/2014) 135 tablet 1  . ondansetron (ZOFRAN ODT) 4 MG disintegrating tablet Take 1 tablet (4 mg total) by mouth every 8 (eight) hours as needed for nausea or vomiting. (Patient not taking: Reported on 09/27/2014) 20 tablet 0  . pantoprazole (PROTONIX) 40 MG tablet Take 40 mg by mouth daily.    Marland Kitchen terazosin (HYTRIN) 5 MG capsule Take 1 capsule (5 mg total) by mouth daily. <please make appointment for refills> (Patient not taking: Reported on 09/27/2014) 90 capsule 0  . terazosin  (HYTRIN) 5 MG capsule Take 1 capsule (5 mg total) by mouth daily. 30 capsule 0   No current facility-administered medications for this visit.    Functional Status:   In your present state of health, do you have any difficulty performing the following activities: 09/27/2014 02/12/2014  Is the patient deaf or have difficulty hearing? N -  Hearing N -  Vision N -  Difficulty concentrating or making decisions N -  Walking or climbing stairs? N -  Doing errands, shopping? N N  Preparing Food and eating ? N -  Using the Toilet? N -  In the past six months, have you accidently leaked urine? N -  Do you have problems with loss of bowel control? N -  Managing your Medications? Y -  Managing your Finances? N -  Housekeeping or managing your Housekeeping? Y -    Fall/Depression Screening:    PHQ 2/9 Scores 09/27/2014  PHQ - 2 Score 2  PHQ- 9 Score 5    Assessment:    Mr. Hammitt is still generally not feeling well. He says he is "tired and worn out from being sick since January with a bad cold." Mr. Boomhower reported to the ED last week and was evaluated and released.   Chronic Health Condition (DM) - Mr. Smedley faithfully adheres to his medication regimen and to daily cbg, bp, weight monitoring and recording; his cbg meter has a low battery reading today so I cannot read averages; however, I can see in his written records that cbg's are never < 170 and in the 200-250 range almost each time he checks it (BID); he has not been exercising as he says he has felt poorly for 3 months; he is perparing himself a frozen sausage, egg, and cheese biscuit for breakfast; fasting cbg this morning was 170; his HgA1C was 7.3 in August 2015  Plan:   [place]        Patient Outreach from 09/27/2014 in [place] Problem One  CBG averages > 200 (worsening control)   Care Plan for Problem One  Active   [facility] CM Short Term Goal #1 (0-30 days)  atient will have cbg 30 day  average < 200 over next 60 days as evidenced by glucose monitor average data   [facility] CM Short Term Goal #1 Start Date  09/13/14   [facility] CM Short Term Goal #2 (0-30 days)  patient will decrease processed carbohydrate intake each day over the next 30 days as evidenced by patient report of same   [facility] CM Short Term Goal #2 Start Date  09/13/14   [facility] CM Short Term Goal #3 (0-30 days)  patient will maintain intentional exercise/walks 2 -3 times/week over the next 30 days as evidenced  by patient documentation of same in blue [facility] notebook   [facility] CM Short Term Goal #3 Start Date  09/13/14   Care Plan Problem Two  protracted acute respiratory illness   Care Plan for Problem Two  Active   [facility] Long Term Goal (31-90) days  patient will recieve appropriate treatment for protracted respiratory illness over the next 31 days   [facility] Long Term Goal Start Date  09/13/14   [facility] CM Short Term Goal #1 (0-30 days)  patient will attend provider appointment as scheduled in next 14 days   [facility] CM Short Term Goal #1 Start Date  09/13/14      Upcoming Appointments:  [place] 10/02/14 [place] (nephrology) April   [facility] Care Management  [phone]

## 2014-10-02 DIAGNOSIS — N401 Enlarged prostate with lower urinary tract symptoms: Secondary | ICD-10-CM | POA: Diagnosis not present

## 2014-10-02 DIAGNOSIS — N3942 Incontinence without sensory awareness: Secondary | ICD-10-CM | POA: Diagnosis not present

## 2014-10-08 DIAGNOSIS — I10 Essential (primary) hypertension: Secondary | ICD-10-CM | POA: Diagnosis not present

## 2014-10-08 DIAGNOSIS — Z79899 Other long term (current) drug therapy: Secondary | ICD-10-CM | POA: Diagnosis not present

## 2014-10-08 DIAGNOSIS — N183 Chronic kidney disease, stage 3 (moderate): Secondary | ICD-10-CM | POA: Diagnosis not present

## 2014-10-08 DIAGNOSIS — R809 Proteinuria, unspecified: Secondary | ICD-10-CM | POA: Diagnosis not present

## 2014-10-08 DIAGNOSIS — E559 Vitamin D deficiency, unspecified: Secondary | ICD-10-CM | POA: Diagnosis not present

## 2014-10-08 DIAGNOSIS — D649 Anemia, unspecified: Secondary | ICD-10-CM | POA: Diagnosis not present

## 2014-10-10 DIAGNOSIS — R809 Proteinuria, unspecified: Secondary | ICD-10-CM | POA: Diagnosis not present

## 2014-10-10 DIAGNOSIS — D638 Anemia in other chronic diseases classified elsewhere: Secondary | ICD-10-CM | POA: Diagnosis not present

## 2014-10-10 DIAGNOSIS — I10 Essential (primary) hypertension: Secondary | ICD-10-CM | POA: Diagnosis not present

## 2014-10-10 DIAGNOSIS — N184 Chronic kidney disease, stage 4 (severe): Secondary | ICD-10-CM | POA: Diagnosis not present

## 2014-10-25 ENCOUNTER — Other Ambulatory Visit: Payer: Self-pay | Admitting: *Deleted

## 2014-10-25 NOTE — Patient Outreach (Signed)
[place] [facility]) Care Management   10/25/2014  Benard Minturn Skoog [birth year] [id number]  Subjective: "I'm finally feeling a little bit better today. I've been sick all winter."  Objective:  Mr. Melhorn is an 81 year old gentleman who was referred to [facility] for case management and disease management assistance for his multiple complex medical problems including DM2, hyperlipidemia, anemia, HTN, and history of CVA. He has had improvement in self monitoring and now weighs, checks bp and pulse, checks cbg's, and documents daily. He takes medications as prescribed. His daughter Lucita Ferrara fills his pill box weekly. Mr. Kleve attends provider appointments as scheduled. He has had difficulty making changes in his diet and does not adhere to prescribed carb modified, heart healthy diet. Mr. Goodbar has declined referral to dietician/nutritionist. His last known HGA1C is 7.3.     BP 144/55 mmHg  Pulse 76  Wt 248 lb (112.492 kg)  SpO2 96%  Review of Systems  Constitutional: Negative.   HENT: Negative.   Eyes: Negative.   Respiratory: Negative.   Cardiovascular: Negative.   Gastrointestinal: Negative.   Genitourinary: Negative.   Musculoskeletal: Positive for myalgias.  Skin: Negative.   Neurological: Negative.   Endo/Heme/Allergies: Negative.   Psychiatric/Behavioral: Negative.     Physical Exam  Constitutional: He is oriented to person, place, and time. He appears well-developed and well-nourished. He is active.  Cardiovascular: Normal rate and regular rhythm.   Respiratory: Effort normal and breath sounds normal.  GI: Soft. Bowel sounds are normal.  Neurological: He is alert and oriented to person, place, and time.  Skin: Skin is warm, dry and intact.  Psychiatric: He has a normal mood and affect. His speech is normal and behavior is normal. Judgment and thought content normal. Cognition and memory are normal.    Current Medications:   Current Outpatient Prescriptions   Medication Sig Dispense Refill  . acetaminophen (TYLENOL) 325 MG tablet Take 650 mg by mouth every 6 (six) hours as needed for mild pain or headache (takes as needed; not daily).    Marland Kitchen amLODipine (NORVASC) 5 MG tablet Take 7.5 mg by mouth daily.    . Cholecalciferol (VITAMIN D) 2000 UNITS CAPS Take 1 capsule by mouth daily.    . Choline Fenofibrate (TRILIPIX) 135 MG capsule Take 135 mg by mouth at bedtime.     . ferrous sulfate 325 (65 FE) MG tablet Take 325 mg by mouth daily.     . furosemide (LASIX) 40 MG tablet Take 1 tablet (40 mg total) by mouth in the morning and 0.5 tablet (20 mg total) by mouth in the evening. NEED APPOINTMENT FOR FUTURE REFILL 45 tablet 0  . furosemide (LASIX) 40 MG tablet Take 1 tablet by mouth every morning & 1/2 tablet every evening. <PLEASE MAKE APPOINTMENT> (Patient not taking: Reported on 09/27/2014) 135 tablet 1  . HYDROcodone-acetaminophen (NORCO) 10-325 MG per tablet Take 1 tablet by mouth every 6 (six) hours as needed for severe pain (has not taken "in as long as I can remember").    . insulin lispro protamine-lispro (HUMALOG 75/25 MIX) (75-25) 100 UNIT/ML SUSP injection Inject 60 Units into the skin 2 (two) times daily with a meal.    . losartan (COZAAR) 50 MG tablet Take 1 tablet by mouth daily.    . meclizine (ANTIVERT) 25 MG tablet Take 25 mg by mouth 3 (three) times daily as needed for dizziness.    . Misc Natural Products (OSTEO BI-FLEX ADV JOINT SHIELD PO) Take 2  capsules by mouth 2 (two) times daily. Has on hand but says he hasn't been taking regularly.    . nebivolol (BYSTOLIC) 10 MG tablet Take 5 mg by mouth every morning.     . ondansetron (ZOFRAN ODT) 4 MG disintegrating tablet Take 1 tablet (4 mg total) by mouth every 8 (eight) hours as needed for nausea or vomiting. 20 tablet 0  . pantoprazole (PROTONIX) 40 MG tablet Take 40 mg by mouth daily.    . potassium chloride (KLOR-CON M10) 10 MEQ tablet Take 1 tablet (10 mEq total) by mouth daily. <please  make appointment for refills> 90 tablet 0  . tamsulosin (FLOMAX) 0.4 MG CAPS capsule Take 0.4 mg by mouth daily.    Marland Kitchen terazosin (HYTRIN) 10 MG capsule Take 10 mg by mouth at bedtime.    Marland Kitchen terazosin (HYTRIN) 5 MG capsule Take 1 capsule (5 mg total) by mouth daily. <please make appointment for refills> (Patient not taking: Reported on 09/27/2014) 90 capsule 0  . terazosin (HYTRIN) 5 MG capsule Take 1 capsule (5 mg total) by mouth daily. 30 capsule 0   No current facility-administered medications for this visit.    Functional Status:   In your present state of health, do you have any difficulty performing the following activities: 09/27/2014 02/12/2014  Hearing? N -  Vision? N -  Difficulty concentrating or making decisions? N -  Walking or climbing stairs? N -  Dressing or bathing? N -  Doing errands, shopping? N N  Preparing Food and eating ? N -  Using the Toilet? N -  In the past six months, have you accidently leaked urine? N -  Do you have problems with loss of bowel control? N -  Managing your Medications? Y -  Managing your Finances? N -  Housekeeping or managing your Housekeeping? Y -    Fall/Depression Screening:    PHQ 2/9 Scores 09/27/2014  PHQ - 2 Score 2  PHQ- 9 Score 5    Assessment:   Multiple chronic health conditions. Currently Mr. Beedle is managing well with the assistance of his daughter. He does not wish to make further changes in his diet or daily routine. He is concerned about not having me come to visit him at home. I explained to him that he currently is not working on making changes to his current health condition. We decided together that I will call him next month for a DM assessment then decide about transferring him to telephonic disease management services.   Plan:   Telephonic outreach next month.    [facility]  [phone]

## 2014-11-06 ENCOUNTER — Other Ambulatory Visit: Payer: Self-pay | Admitting: Cardiovascular Disease

## 2014-11-07 ENCOUNTER — Encounter: Payer: Self-pay | Admitting: *Deleted

## 2014-11-08 NOTE — Telephone Encounter (Signed)
Med refilled.

## 2014-11-14 NOTE — Addendum Note (Signed)
Addended by: Fidel Levy on: 11/14/2014 02:59 PM   Modules accepted: Orders

## 2014-11-20 ENCOUNTER — Ambulatory Visit: Payer: Medicare Other | Admitting: *Deleted

## 2014-11-24 ENCOUNTER — Other Ambulatory Visit: Payer: Self-pay | Admitting: Cardiovascular Disease

## 2014-11-25 ENCOUNTER — Emergency Department (HOSPITAL_COMMUNITY): Payer: Medicare Other

## 2014-11-25 ENCOUNTER — Inpatient Hospital Stay (HOSPITAL_COMMUNITY)
Admission: EM | Admit: 2014-11-25 | Discharge: 2014-11-28 | DRG: 682 | Disposition: A | Discharge status: Home Health | Payer: Medicare Other | Attending: Internal Medicine | Admitting: Internal Medicine

## 2014-11-25 ENCOUNTER — Encounter (HOSPITAL_COMMUNITY): Payer: Self-pay | Admitting: Emergency Medicine

## 2014-11-25 DIAGNOSIS — I4510 Unspecified right bundle-branch block: Secondary | ICD-10-CM | POA: Diagnosis present

## 2014-11-25 DIAGNOSIS — I129 Hypertensive chronic kidney disease with stage 1 through stage 4 chronic kidney disease, or unspecified chronic kidney disease: Secondary | ICD-10-CM | POA: Diagnosis present

## 2014-11-25 DIAGNOSIS — Z6837 Body mass index (BMI) 37.0-37.9, adult: Secondary | ICD-10-CM | POA: Diagnosis not present

## 2014-11-25 DIAGNOSIS — Z9079 Acquired absence of other genital organ(s): Secondary | ICD-10-CM | POA: Diagnosis present

## 2014-11-25 DIAGNOSIS — N179 Acute kidney failure, unspecified: Secondary | ICD-10-CM | POA: Diagnosis not present

## 2014-11-25 DIAGNOSIS — R531 Weakness: Secondary | ICD-10-CM

## 2014-11-25 DIAGNOSIS — Z91040 Latex allergy status: Secondary | ICD-10-CM | POA: Diagnosis not present

## 2014-11-25 DIAGNOSIS — E1165 Type 2 diabetes mellitus with hyperglycemia: Secondary | ICD-10-CM | POA: Diagnosis present

## 2014-11-25 DIAGNOSIS — Z885 Allergy status to narcotic agent status: Secondary | ICD-10-CM | POA: Diagnosis not present

## 2014-11-25 DIAGNOSIS — Z8673 Personal history of transient ischemic attack (TIA), and cerebral infarction without residual deficits: Secondary | ICD-10-CM | POA: Diagnosis not present

## 2014-11-25 DIAGNOSIS — E1122 Type 2 diabetes mellitus with diabetic chronic kidney disease: Secondary | ICD-10-CM | POA: Diagnosis present

## 2014-11-25 DIAGNOSIS — Z86010 Personal history of colonic polyps: Secondary | ICD-10-CM | POA: Diagnosis not present

## 2014-11-25 DIAGNOSIS — K5520 Angiodysplasia of colon without hemorrhage: Secondary | ICD-10-CM | POA: Diagnosis present

## 2014-11-25 DIAGNOSIS — R627 Adult failure to thrive: Secondary | ICD-10-CM | POA: Diagnosis present

## 2014-11-25 DIAGNOSIS — Z882 Allergy status to sulfonamides status: Secondary | ICD-10-CM

## 2014-11-25 DIAGNOSIS — E860 Dehydration: Secondary | ICD-10-CM | POA: Diagnosis present

## 2014-11-25 DIAGNOSIS — Z9049 Acquired absence of other specified parts of digestive tract: Secondary | ICD-10-CM | POA: Diagnosis present

## 2014-11-25 DIAGNOSIS — E669 Obesity, unspecified: Secondary | ICD-10-CM | POA: Diagnosis present

## 2014-11-25 DIAGNOSIS — R0609 Other forms of dyspnea: Secondary | ICD-10-CM | POA: Diagnosis present

## 2014-11-25 DIAGNOSIS — N400 Enlarged prostate without lower urinary tract symptoms: Secondary | ICD-10-CM | POA: Diagnosis not present

## 2014-11-25 DIAGNOSIS — K219 Gastro-esophageal reflux disease without esophagitis: Secondary | ICD-10-CM | POA: Diagnosis present

## 2014-11-25 DIAGNOSIS — G4730 Sleep apnea, unspecified: Secondary | ICD-10-CM | POA: Diagnosis present

## 2014-11-25 DIAGNOSIS — N184 Chronic kidney disease, stage 4 (severe): Secondary | ICD-10-CM | POA: Diagnosis present

## 2014-11-25 DIAGNOSIS — Z794 Long term (current) use of insulin: Secondary | ICD-10-CM | POA: Diagnosis not present

## 2014-11-25 DIAGNOSIS — R0602 Shortness of breath: Secondary | ICD-10-CM | POA: Diagnosis not present

## 2014-11-25 DIAGNOSIS — I10 Essential (primary) hypertension: Secondary | ICD-10-CM | POA: Diagnosis not present

## 2014-11-25 DIAGNOSIS — D649 Anemia, unspecified: Secondary | ICD-10-CM | POA: Diagnosis present

## 2014-11-25 DIAGNOSIS — E118 Type 2 diabetes mellitus with unspecified complications: Secondary | ICD-10-CM | POA: Diagnosis not present

## 2014-11-25 DIAGNOSIS — Z79899 Other long term (current) drug therapy: Secondary | ICD-10-CM | POA: Diagnosis not present

## 2014-11-25 DIAGNOSIS — L821 Other seborrheic keratosis: Secondary | ICD-10-CM | POA: Diagnosis present

## 2014-11-25 DIAGNOSIS — I5033 Acute on chronic diastolic (congestive) heart failure: Secondary | ICD-10-CM | POA: Diagnosis not present

## 2014-11-25 DIAGNOSIS — I509 Heart failure, unspecified: Secondary | ICD-10-CM | POA: Diagnosis not present

## 2014-11-25 DIAGNOSIS — H5462 Unqualified visual loss, left eye, normal vision right eye: Secondary | ICD-10-CM | POA: Diagnosis present

## 2014-11-25 DIAGNOSIS — E119 Type 2 diabetes mellitus without complications: Secondary | ICD-10-CM

## 2014-11-25 DIAGNOSIS — Z87442 Personal history of urinary calculi: Secondary | ICD-10-CM

## 2014-11-25 DIAGNOSIS — E785 Hyperlipidemia, unspecified: Secondary | ICD-10-CM | POA: Diagnosis present

## 2014-11-25 DIAGNOSIS — Z888 Allergy status to other drugs, medicaments and biological substances status: Secondary | ICD-10-CM

## 2014-11-25 DIAGNOSIS — R079 Chest pain, unspecified: Secondary | ICD-10-CM | POA: Diagnosis not present

## 2014-11-25 LAB — CBC WITH DIFFERENTIAL/PLATELET
BASOS ABS: 0.1 10*3/uL (ref 0.0–0.1)
Basophils Relative: 1 % (ref 0–1)
Eosinophils Absolute: 0.4 10*3/uL (ref 0.0–0.7)
Eosinophils Relative: 4 % (ref 0–5)
HCT: 34.7 % — ABNORMAL LOW (ref 39.0–52.0)
Hemoglobin: 11.2 g/dL — ABNORMAL LOW (ref 13.0–17.0)
LYMPHS ABS: 2.2 10*3/uL (ref 0.7–4.0)
LYMPHS PCT: 22 % (ref 12–46)
MCH: 28.4 pg (ref 26.0–34.0)
MCHC: 32.3 g/dL (ref 30.0–36.0)
MCV: 87.8 fL (ref 78.0–100.0)
MONO ABS: 0.6 10*3/uL (ref 0.1–1.0)
Monocytes Relative: 6 % (ref 3–12)
NEUTROS ABS: 6.6 10*3/uL (ref 1.7–7.7)
Neutrophils Relative %: 67 % (ref 43–77)
Platelets: 402 10*3/uL — ABNORMAL HIGH (ref 150–400)
RBC: 3.95 MIL/uL — AB (ref 4.22–5.81)
RDW: 14.4 % (ref 11.5–15.5)
WBC: 9.9 10*3/uL (ref 4.0–10.5)

## 2014-11-25 LAB — URINALYSIS, ROUTINE W REFLEX MICROSCOPIC
Bilirubin Urine: NEGATIVE
Glucose, UA: 250 mg/dL — AB
KETONES UR: NEGATIVE mg/dL
Leukocytes, UA: NEGATIVE
Nitrite: NEGATIVE
PH: 5 (ref 5.0–8.0)
PROTEIN: 30 mg/dL — AB
SPECIFIC GRAVITY, URINE: 1.02 (ref 1.005–1.030)
Urobilinogen, UA: 0.2 mg/dL (ref 0.0–1.0)

## 2014-11-25 LAB — COMPREHENSIVE METABOLIC PANEL
ALBUMIN: 3.5 g/dL (ref 3.5–5.0)
ALT: 16 U/L — AB (ref 17–63)
AST: 19 U/L (ref 15–41)
Alkaline Phosphatase: 48 U/L (ref 38–126)
Anion gap: 11 (ref 5–15)
BUN: 47 mg/dL — AB (ref 6–20)
CO2: 25 mmol/L (ref 22–32)
Calcium: 8.8 mg/dL — ABNORMAL LOW (ref 8.9–10.3)
Chloride: 103 mmol/L (ref 101–111)
Creatinine, Ser: 3.49 mg/dL — ABNORMAL HIGH (ref 0.61–1.24)
GFR calc Af Amer: 17 mL/min — ABNORMAL LOW (ref >60)
GFR calc non Af Amer: 15 mL/min — ABNORMAL LOW (ref >60)
Glucose, Bld: 267 mg/dL — ABNORMAL HIGH (ref 65–99)
POTASSIUM: 3.7 mmol/L (ref 3.5–5.1)
Sodium: 139 mmol/L (ref 135–145)
TOTAL PROTEIN: 6.9 g/dL (ref 6.5–8.1)
Total Bilirubin: 0.4 mg/dL (ref 0.3–1.2)

## 2014-11-25 LAB — URINE MICROSCOPIC-ADD ON

## 2014-11-25 LAB — CBG MONITORING, ED: Glucose-Capillary: 267 mg/dL — ABNORMAL HIGH (ref 65–99)

## 2014-11-25 LAB — BRAIN NATRIURETIC PEPTIDE: B NATRIURETIC PEPTIDE 5: 66 pg/mL (ref 0.0–100.0)

## 2014-11-25 MED ORDER — HEPARIN SODIUM (PORCINE) 5000 UNIT/ML IJ SOLN
5000.0000 [IU] | Freq: Three times a day (TID) | INTRAMUSCULAR | Status: DC
  Administered 2014-11-26 – 2014-11-28 (×9): 5000 [IU] via SUBCUTANEOUS
  Filled 2014-11-25 (×7): qty 1

## 2014-11-25 MED ORDER — TAMSULOSIN HCL 0.4 MG PO CAPS
0.4000 mg | ORAL_CAPSULE | Freq: Every day | ORAL | Status: DC
  Administered 2014-11-26 – 2014-11-28 (×3): 0.4 mg via ORAL
  Filled 2014-11-25 (×3): qty 1

## 2014-11-25 MED ORDER — FINASTERIDE 5 MG PO TABS
5.0000 mg | ORAL_TABLET | Freq: Every day | ORAL | Status: DC
  Administered 2014-11-26 – 2014-11-28 (×3): 5 mg via ORAL
  Filled 2014-11-25 (×5): qty 1

## 2014-11-25 MED ORDER — NEBIVOLOL HCL 10 MG PO TABS
5.0000 mg | ORAL_TABLET | ORAL | Status: DC
  Administered 2014-11-26 – 2014-11-28 (×3): 5 mg via ORAL
  Filled 2014-11-25 (×3): qty 1

## 2014-11-25 MED ORDER — AMLODIPINE BESYLATE 5 MG PO TABS
7.5000 mg | ORAL_TABLET | Freq: Every day | ORAL | Status: DC
  Administered 2014-11-26 – 2014-11-28 (×3): 7.5 mg via ORAL
  Filled 2014-11-25 (×3): qty 2

## 2014-11-25 MED ORDER — TERAZOSIN HCL 5 MG PO CAPS
10.0000 mg | ORAL_CAPSULE | Freq: Every day | ORAL | Status: DC
Start: 2014-11-25 — End: 2014-11-28
  Administered 2014-11-26 – 2014-11-27 (×3): 10 mg via ORAL
  Filled 2014-11-25 (×3): qty 2

## 2014-11-25 MED ORDER — ACETAMINOPHEN 325 MG PO TABS
650.0000 mg | ORAL_TABLET | Freq: Four times a day (QID) | ORAL | Status: DC | PRN
  Administered 2014-11-26: 650 mg via ORAL
  Filled 2014-11-25: qty 2

## 2014-11-25 MED ORDER — INSULIN ASPART 100 UNIT/ML ~~LOC~~ SOLN
0.0000 [IU] | Freq: Every day | SUBCUTANEOUS | Status: DC
  Administered 2014-11-26 (×2): 2 [IU] via SUBCUTANEOUS

## 2014-11-25 MED ORDER — INSULIN ASPART 100 UNIT/ML ~~LOC~~ SOLN
0.0000 [IU] | Freq: Three times a day (TID) | SUBCUTANEOUS | Status: DC
  Administered 2014-11-26 (×3): 3 [IU] via SUBCUTANEOUS
  Administered 2014-11-27: 5 [IU] via SUBCUTANEOUS
  Administered 2014-11-27: 3 [IU] via SUBCUTANEOUS
  Administered 2014-11-27: 2 [IU] via SUBCUTANEOUS
  Administered 2014-11-28: 3 [IU] via SUBCUTANEOUS
  Administered 2014-11-28: 2 [IU] via SUBCUTANEOUS

## 2014-11-25 MED ORDER — SODIUM CHLORIDE 0.9 % IV BOLUS (SEPSIS)
500.0000 mL | Freq: Once | INTRAVENOUS | Status: AC
  Administered 2014-11-25: 500 mL via INTRAVENOUS

## 2014-11-25 MED ORDER — INSULIN ASPART PROT & ASPART (70-30 MIX) 100 UNIT/ML ~~LOC~~ SUSP
30.0000 [IU] | Freq: Two times a day (BID) | SUBCUTANEOUS | Status: DC
  Administered 2014-11-26 – 2014-11-28 (×5): 30 [IU] via SUBCUTANEOUS
  Filled 2014-11-25: qty 10

## 2014-11-25 NOTE — ED Provider Notes (Signed)
CSN: [id number]     Arrival date & time 11/25/14  1818 History   First MD Initiated Contact with Patient 11/25/14 1827     Chief Complaint  Patient presents with  . Fatigue     (Consider location/radiation/quality/duration/timing/severity/associated sxs/prior Treatment) HPI 81 year old male with multiple health problems including diabetes, hyper tension, and GI bleeding presents today with complaints of worsening generalized weakness. He states symptoms have been occurring over the past week. He has had poor appetite with increased nausea although no vomiting or diarrhea. He has been dyspneic which is worsened with laying flat. He lives alone and has been very anxious that he will be unable to care for himself. He denies any pain or injury. He has been followed by [facility] and has someone visiting him in the home. His daughter lives 40-50 miles away but dries to see him several times a week and puts his medication into the pill dispenser. Past Medical History  Diagnosis Date  . GI bleed     2006, TCS/TI showed small polyp. EGD showed erosive antral gastritis with two polyp, one oozing and tx. HP neg. SB capsule shoed few jejunal ulcers with bleeding (naproxen and ASA)  . GI bleed     02/2010, EGD->pancreatitc rest, fundal gland polyps,  no bleeding. TCS->blood-tinged colonic effluent throughout the colon without bleeding lesion found, nl TI. SB capsule->SB erosions, nonbleeding, incomplete study. Patient on naproxyn.   . DM (diabetes mellitus)   . Pneumonia   . Hyperlipidemia   . HTN (hypertension)   . Stroke   . GERD (gastroesophageal reflux disease)   . Poor vision     left eye  . Sleep apnea   . Obesity   . Ischemic colitis, enteritis, or enterocolitis     flex sig 01/2010  . Gastric AVM 10/24/10    GI bleed EGD w/ bicap and hemoclip placement, 2 AVMs, presented w/bright red rectal bleeding  . GI bleed 10/27/10    ileocolonoscopy/GIVENS capsule study by Dr Rourk->bloody colonic effluent  throughout colon but no lesion identified, TA @ hepatic flexure, fresh blood in dital SB on capsule. Nuc med RBC study negative  . Tubular adenoma of colon 10/27/10    on colonoscopy Dr Gala Romney  . Seborrheic keratoses      back and chest  . Bundle branch block, right   . Hypoxia     requiring oxygen while in hospital  . Nephrolithiasis   . Chronic renal insufficiency   . Kidney disease    Past Surgical History  Procedure Laterality Date  . Back surgery    . Transurethral resection of prostate    . Cataract extraction      bilateral  . Appendectomy    . Cholecystectomy    . Elbow surgery    . Esophagogastroduodenoscopy  10/23/10    Dr. Tora Kindred arteriovenous malformations,GI bleed- capsule  . Colonoscopy  10/27/10    Dr. Chauncy Lean adenoma, normal rectum bloody colonic effluent throughout the colon with out bleeding lesion seen.   . Givens capsule study N/A 02/14/2014    Distal SB ulcers with active bleeding noted.  . Esophagogastroduodenoscopy N/A 02/13/2014    HER:DEYCXK gland polyps. Pancreatic rest. 2 tiny areas of erosions/ulceration-not likely significant-s/p bx  . Colonoscopy N/A 02/16/2014    GYJ:EHUDJS appearing TI. Moderate sized internal hemorrhoids/[facility]/[place] colon are redundant/TWO COLON POLYPS REMOVED  . Nm myoview ltd  2012    Negative for Ischemia  . Polypectomy    . Doppler echocardiography    .  Stress dipyridamole myocardial perfusion    . Cardiovascular stress test    . Cardiac catheterization     Family History  Problem Relation Age of Onset  . Aneurysm Daughter     brain, deceased age 15  . GI problems Brother     gi bleed  . Cancer Sister     unknown type  . Colon cancer Neg Hx   . Liver disease Neg Hx    History  Substance Use Topics  . Smoking status: Never Smoker   . Smokeless tobacco: Never Used  . Alcohol Use: No    Review of Systems  All other systems reviewed and are negative.     Allergies  Latex; Propoxyphene; Sulfa  antibiotics; and Betadine  Home Medications   Prior to Admission medications   Medication Sig Start Date End Date Taking? Authorizing Provider  acetaminophen (TYLENOL) 325 MG tablet Take 650 mg by mouth every 6 (six) hours as needed for mild pain or headache (takes as needed; not daily).   Yes Historical Provider, MD  amLODipine (NORVASC) 5 MG tablet Take 7.5 mg by mouth daily.   Yes Historical Provider, MD  Cholecalciferol (VITAMIN D) 2000 UNITS CAPS Take 1 capsule by mouth daily.   Yes Historical Provider, MD  Choline Fenofibrate (TRILIPIX) 135 MG capsule Take 135 mg by mouth at bedtime.    Yes Historical Provider, MD  ferrous sulfate 325 (65 FE) MG tablet Take 325 mg by mouth daily.    Yes Historical Provider, MD  finasteride (PROSCAR) 5 MG tablet Take 5 mg by mouth daily. 10/04/14  Yes Historical Provider, MD  furosemide (LASIX) 40 MG tablet Take 1 tablet (40 mg total) by mouth in the morning and 0.5 tablet (20 mg total) by mouth in the evening. NEED APPOINTMENT FOR FUTURE REFILL 06/18/14  Yes Troy Sine, MD  insulin lispro protamine-lispro (HUMALOG 75/25 MIX) (75-25) 100 UNIT/ML SUSP injection Inject 60 Units into the skin 2 (two) times daily with a meal. 01/29/14  Yes Kathie Dike, MD  losartan (COZAAR) 50 MG tablet Take 1 tablet by mouth daily. 11/23/13  Yes Historical Provider, MD  meclizine (ANTIVERT) 25 MG tablet Take 25 mg by mouth 3 (three) times daily as needed for dizziness.   Yes Historical Provider, MD  Misc Natural Products (OSTEO BI-FLEX ADV JOINT SHIELD PO) Take 2 capsules by mouth 2 (two) times daily. Has on hand but says he hasn't been taking regularly.   Yes Historical Provider, MD  nebivolol (BYSTOLIC) 10 MG tablet Take 5 mg by mouth every morning.    Yes Historical Provider, MD  ondansetron (ZOFRAN ODT) 4 MG disintegrating tablet Take 1 tablet (4 mg total) by mouth every 8 (eight) hours as needed for nausea or vomiting. 09/18/14  Yes Kristen N Ward, DO  pantoprazole  (PROTONIX) 40 MG tablet Take 40 mg by mouth daily. 02/19/14  Yes Samuella Cota, MD  potassium chloride (KLOR-CON M10) 10 MEQ tablet Take 1 tablet (10 mEq total) by mouth daily. <please make appointment for refills> 05/09/14  Yes Troy Sine, MD  tamsulosin (FLOMAX) 0.4 MG CAPS capsule Take 0.4 mg by mouth daily.   Yes Historical Provider, MD  terazosin (HYTRIN) 10 MG capsule Take 10 mg by mouth at bedtime.   Yes Historical Provider, MD  HYDROcodone-acetaminophen (NORCO) 10-325 MG per tablet Take 1 tablet by mouth every 6 (six) hours as needed for severe pain (has not taken "in as long as I can remember"). 01/24/14  Historical Provider, MD  terazosin (HYTRIN) 5 MG capsule Take 1 capsule (5 mg total) by mouth daily. <please make appointment for refills> Patient not taking: Reported on 09/27/2014 05/09/14   Troy Sine, MD  terazosin (HYTRIN) 5 MG capsule Take 1 capsule (5 mg total) by mouth daily. 07/22/14   Troy Sine, MD   BP 142/61 mmHg  Pulse 80  Temp(Src) 98.9 F (37.2 C) (Rectal)  Resp 20  Ht 5\' 7"  (1.702 m)  Wt 244 lb (110.678 kg)  BMI 38.21 kg/m2  SpO2 92% Physical Exam  Constitutional: He is oriented to person, place, and time. He appears well-developed and well-nourished.  Obese elderly male who appears chronically ill  HENT:  Head: Normocephalic and atraumatic.  Right Ear: External ear normal.  Left Ear: External ear normal.  Mucous membranes are dry  Eyes: Conjunctivae and EOM are normal. Pupils are equal, round, and reactive to light.  Neck: Normal range of motion. Neck supple.  Cardiovascular: Normal rate, regular rhythm and normal heart sounds.   Pulmonary/Chest:  Breath sounds are decreased bilaterally but no crackles rails or wheezes are noted  Abdominal: Soft. Bowel sounds are normal.  Musculoskeletal: Normal range of motion. He exhibits edema.  Neurological: He is alert and oriented to person, place, and time. He has normal reflexes.  Skin: Skin is warm  and dry.  Multiple diffuse caf au lait spots  Psychiatric: He has a normal mood and affect. His behavior is normal.  Nursing note and vitals reviewed.   ED Course  Procedures (including critical care time) Labs Review Labs Reviewed  CBC WITH DIFFERENTIAL/PLATELET - Abnormal; Notable for the following:    RBC 3.95 (*)    Hemoglobin 11.2 (*)    HCT 34.7 (*)    Platelets 402 (*)    All other components within normal limits  COMPREHENSIVE METABOLIC PANEL - Abnormal; Notable for the following:    Glucose, Bld 267 (*)    BUN 47 (*)    Creatinine, Ser 3.49 (*)    Calcium 8.8 (*)    ALT 16 (*)    GFR calc non Af Amer 15 (*)    GFR calc Af Amer 17 (*)    All other components within normal limits  CBG MONITORING, ED - Abnormal; Notable for the following:    Glucose-Capillary 267 (*)    All other components within normal limits  URINALYSIS, ROUTINE W REFLEX MICROSCOPIC (NOT AT [facility])    Imaging Review Dg Chest 2 View  11/25/2014   CLINICAL DATA:  Acute onset of generalized weakness and chest pain. Subacute onset of nausea. Shortness of breath. Initial encounter.  EXAM: CHEST  2 VIEW  COMPARISON:  Chest radiograph from 09/18/2014  FINDINGS: The lungs are well-aerated. Mild vascular congestion is noted. Mild bibasilar atelectasis is seen. There is no evidence of pleural effusion or pneumothorax.  The heart is normal in size; the mediastinal contour is within normal limits. No acute osseous abnormalities are seen.  IMPRESSION: Mild vascular congestion noted.  Mild bibasilar atelectasis seen.   Electronically Signed   By: Garald Balding M.D.   On: 11/25/2014 19:17     EKG Interpretation   Date/Time:  Sunday Nov 25 2014 18:27:40 EDT Ventricular Rate:  75 PR Interval:  159 QRS Duration: 159 QT Interval:  428 QTC Calculation: 478 R Axis:   84 Text Interpretation:  Sinus rhythm Right bundle branch block Confirmed by  Catera Hankins MD, Chealsea Paske ([id number]) on 11/25/2014 8:10:40 PM  MDM   Final  diagnoses:  Weakness   81 year old male with multiple presents today with increasing generalized weakness, worsening renal insufficiency and pulmonary vascular congestion.  Patient likely needs some cautious hydration.    1- weakness 2- renal failure- creatinine 3.49, trending up from recent.   3-hyperglycemia   Plan admission for hydration.     Pattricia Boss, MD 11/27/14 1257

## 2014-11-25 NOTE — H&P (Signed)
[facility] History and Physical  Saleh Ulbrich Lindenberger OZD:[id number] DOB: [birth year]    PCP:   Purvis Kilts, MD   Chief Complaint: weakness.  Failure to thrive.   HPI: John [name] is an 81 y.o. male with hx of DM, prior GI bleed, hx of CVA, HTN, CKD, HLD, neprholithiasis, lives at home with help from his brother, presented to the ER complaining of feeling weak.  He hasn't been out of his bed, but other than that, no specific complaints, i.e, he has had no fever, chills, abdominal pain, diarrhea, chest pain, SOB, or HA.  His 2 brothers who have been at this bedside said his daughter is the one responsible for his care.  She sees him rarely according to them.  He can't get to the grocery store or cook for himself unless he has help.  Evaluation in the ER included a Cr of 3.4, normal K, and BS of 267.  The Cr is not far from his baseline a couple of months ago.  His UA showed no infection, and CXR showed some vascular congestion.  EDP felt that he volume depleted, and hospitalist was asked to admit him for dehydration, in the setting of CHF, and failure to thrive, with AKI on CKD.    Rewiew of Systems:  Constitutional: Negative for malaise, fever and chills. No significant weight loss or weight gain Eyes: Negative for eye pain, redness and discharge, diplopia, visual changes, or flashes of light. ENMT: Negative for ear pain, hoarseness, nasal congestion, sinus pressure and sore throat. No headaches; tinnitus, drooling, or problem swallowing. Cardiovascular: Negative for chest pain, palpitations, diaphoresis, dyspnea and peripheral edema. ; No orthopnea, PND Respiratory: Negative for cough, hemoptysis, wheezing and stridor. No pleuritic chestpain. Gastrointestinal: Negative for nausea, vomiting, diarrhea, constipation, abdominal pain, melena, blood in stool, hematemesis, jaundice and rectal bleeding.    Genitourinary: Negative for frequency, dysuria, incontinence,flank pain and  hematuria; Musculoskeletal: Negative for back pain and neck pain. Negative for swelling and trauma.;  Skin: . Negative for pruritus, rash, abrasions, bruising and skin lesion.; ulcerations Neuro: Negative for headache, lightheadedness and neck stiffness. Negative for weakness, altered level of consciousness , altered mental status, extremity weakness, burning feet, involuntary movement, seizure and syncope.  Psych: negative for anxiety,  insomnia, tearfulness, panic attacks, hallucinations, paranoia, suicidal or homicidal ideation   Past Medical History  Diagnosis Date  . GI bleed     2006, TCS/TI showed small polyp. EGD showed erosive antral gastritis with two polyp, one oozing and tx. HP neg. SB capsule shoed few jejunal ulcers with bleeding (naproxen and ASA)  . GI bleed     02/2010, EGD->pancreatitc rest, fundal gland polyps,  no bleeding. TCS->blood-tinged colonic effluent throughout the colon without bleeding lesion found, nl TI. SB capsule->SB erosions, nonbleeding, incomplete study. Patient on naproxyn.   . DM (diabetes mellitus)   . Pneumonia   . Hyperlipidemia   . HTN (hypertension)   . Stroke   . GERD (gastroesophageal reflux disease)   . Poor vision     left eye  . Sleep apnea   . Obesity   . Ischemic colitis, enteritis, or enterocolitis     flex sig 01/2010  . Gastric AVM 10/24/10    GI bleed EGD w/ bicap and hemoclip placement, 2 AVMs, presented w/bright red rectal bleeding  . GI bleed 10/27/10    ileocolonoscopy/GIVENS capsule study by Dr Rourk->bloody colonic effluent throughout colon but no lesion identified, TA @ hepatic flexure, fresh blood in  dital SB on capsule. Nuc med RBC study negative  . Tubular adenoma of colon 10/27/10    on colonoscopy Dr Gala Romney  . Seborrheic keratoses      back and chest  . Bundle branch block, right   . Hypoxia     requiring oxygen while in hospital  . Nephrolithiasis   . Chronic renal insufficiency   . Kidney disease     Past Surgical  History  Procedure Laterality Date  . Back surgery    . Transurethral resection of prostate    . Cataract extraction      bilateral  . Appendectomy    . Cholecystectomy    . Elbow surgery    . Esophagogastroduodenoscopy  10/23/10    Dr. Tora Kindred arteriovenous malformations,GI bleed- capsule  . Colonoscopy  10/27/10    Dr. Chauncy Lean adenoma, normal rectum bloody colonic effluent throughout the colon with out bleeding lesion seen.   . Givens capsule study N/A 02/14/2014    Distal SB ulcers with active bleeding noted.  . Esophagogastroduodenoscopy N/A 02/13/2014    JME:QASTMH gland polyps. Pancreatic rest. 2 tiny areas of erosions/ulceration-not likely significant-s/p bx  . Colonoscopy N/A 02/16/2014    DQQ:IWLNLG appearing TI. Moderate sized internal hemorrhoids/[facility]/[place] colon are redundant/TWO COLON POLYPS REMOVED  . Nm myoview ltd  2012    Negative for Ischemia  . Polypectomy    . Doppler echocardiography    . Stress dipyridamole myocardial perfusion    . Cardiovascular stress test    . Cardiac catheterization      Medications:  HOME MEDS: Prior to Admission medications   Medication Sig Start Date End Date Taking? Authorizing Provider  acetaminophen (TYLENOL) 325 MG tablet Take 650 mg by mouth every 6 (six) hours as needed for mild pain or headache (takes as needed; not daily).   Yes Historical Provider, MD  amLODipine (NORVASC) 5 MG tablet Take 7.5 mg by mouth daily.   Yes Historical Provider, MD  Cholecalciferol (VITAMIN D) 2000 UNITS CAPS Take 1 capsule by mouth daily.   Yes Historical Provider, MD  Choline Fenofibrate (TRILIPIX) 135 MG capsule Take 135 mg by mouth at bedtime.    Yes Historical Provider, MD  ferrous sulfate 325 (65 FE) MG tablet Take 325 mg by mouth daily.    Yes Historical Provider, MD  finasteride (PROSCAR) 5 MG tablet Take 5 mg by mouth daily. 10/04/14  Yes Historical Provider, MD  furosemide (LASIX) 40 MG tablet Take 1 tablet (40 mg total) by mouth in  the morning and 0.5 tablet (20 mg total) by mouth in the evening. NEED APPOINTMENT FOR FUTURE REFILL 06/18/14  Yes Troy Sine, MD  insulin lispro protamine-lispro (HUMALOG 75/25 MIX) (75-25) 100 UNIT/ML SUSP injection Inject 60 Units into the skin 2 (two) times daily with a meal. 01/29/14  Yes Kathie Dike, MD  losartan (COZAAR) 50 MG tablet Take 1 tablet by mouth daily. 11/23/13  Yes Historical Provider, MD  meclizine (ANTIVERT) 25 MG tablet Take 25 mg by mouth 3 (three) times daily as needed for dizziness.   Yes Historical Provider, MD  Misc Natural Products (OSTEO BI-FLEX ADV JOINT SHIELD PO) Take 2 capsules by mouth 2 (two) times daily. Has on hand but says he hasn't been taking regularly.   Yes Historical Provider, MD  nebivolol (BYSTOLIC) 10 MG tablet Take 5 mg by mouth every morning.    Yes Historical Provider, MD  ondansetron (ZOFRAN ODT) 4 MG disintegrating tablet Take 1 tablet (4 mg total) by  mouth every 8 (eight) hours as needed for nausea or vomiting. 09/18/14  Yes Kristen N Ward, DO  pantoprazole (PROTONIX) 40 MG tablet Take 40 mg by mouth daily. 02/19/14  Yes Samuella Cota, MD  potassium chloride (KLOR-CON M10) 10 MEQ tablet Take 1 tablet (10 mEq total) by mouth daily. <please make appointment for refills> 05/09/14  Yes Troy Sine, MD  tamsulosin (FLOMAX) 0.4 MG CAPS capsule Take 0.4 mg by mouth daily.   Yes Historical Provider, MD  terazosin (HYTRIN) 10 MG capsule Take 10 mg by mouth at bedtime.   Yes Historical Provider, MD  HYDROcodone-acetaminophen (NORCO) 10-325 MG per tablet Take 1 tablet by mouth every 6 (six) hours as needed for severe pain (has not taken "in as long as I can remember"). 01/24/14   Historical Provider, MD  terazosin (HYTRIN) 5 MG capsule Take 1 capsule (5 mg total) by mouth daily. <please make appointment for refills> Patient not taking: Reported on 09/27/2014 05/09/14   Troy Sine, MD  terazosin (HYTRIN) 5 MG capsule Take 1 capsule (5 mg total) by  mouth daily. 07/22/14   Troy Sine, MD     Allergies:  Allergies  Allergen Reactions  . Latex Other (See Comments)    unknown  . Propoxyphene     Other reaction(s): Dizziness  . Sulfa Antibiotics Other (See Comments)    unknown  . Betadine [Povidone Iodine] Rash    Social History:   reports that he has never smoked. He has never used smokeless tobacco. He reports that he does not drink alcohol or use illicit drugs.  Family History: Family History  Problem Relation Age of Onset  . Aneurysm Daughter     brain, deceased age 31  . GI problems Brother     gi bleed  . Cancer Sister     unknown type  . Colon cancer Neg Hx   . Liver disease Neg Hx      Physical Exam: Filed Vitals:   11/25/14 1930 11/25/14 2000 11/25/14 2030 11/25/14 2100  BP: 144/53 162/65 127/59 151/60  Pulse: 69 72 71 66  Temp:      TempSrc:      Resp:  18 17   Height:      Weight:      SpO2: 93% 91% 93% 93%   Blood pressure 151/60, pulse 66, temperature 98.9 F (37.2 C), temperature source Rectal, resp. rate 17, height 5\' 7"  (1.702 m), weight 110.678 kg (244 lb), SpO2 93 %.  GEN:  Pleasant patient lying in the stretcher in no acute distress; cooperative with exam. PSYCH:  alert and oriented x4; does not appear anxious or depressed; affect is appropriate. HEENT: Mucous membranes pink and anicteric; PERRLA; EOM intact; no cervical lymphadenopathy nor thyromegaly or carotid bruit; no JVD; There were no stridor. Neck is very supple. Breasts:: Not examined CHEST WALL: No tenderness CHEST: Normal respiration, clear to auscultation bilaterally.  HEART: Regular rate and rhythm.  There are no murmur, rub, or gallops.   BACK: No kyphosis or scoliosis; no CVA tenderness ABDOMEN: soft and non-tender; no masses, no organomegaly, normal abdominal bowel sounds; no pannus; no intertriginous candida. There is no rebound and no distention. Rectal Exam: Not done EXTREMITIES: No bone or joint deformity;  age-appropriate arthropathy of the hands and knees; no edema; no ulcerations.  There is no calf tenderness. Genitalia: not examined PULSES: 2+ and symmetric SKIN: Normal hydration no rash or ulceration. 2 lesions on his nose suspicious for BCC.  CNS: Cranial nerves 2-12 grossly intact no focal lateralizing neurologic deficit.  Speech is fluent; uvula elevated with phonation, facial symmetry and tongue midline. DTR are normal bilaterally, cerebella exam is intact, barbinski is negative and strengths are equaled bilaterally.  No sensory loss.   Labs on Admission:  Basic Metabolic Panel:  Recent Labs Lab 11/25/14 1855  NA 139  K 3.7  CL 103  CO2 25  GLUCOSE 267*  BUN 47*  CREATININE 3.49*  CALCIUM 8.8*   Liver Function Tests:  Recent Labs Lab 11/25/14 1855  AST 19  ALT 16*  ALKPHOS 48  BILITOT 0.4  PROT 6.9  ALBUMIN 3.5   No results for input(s): LIPASE, AMYLASE in the last 168 hours. No results for input(s): AMMONIA in the last 168 hours. CBC:  Recent Labs Lab 11/25/14 1855  WBC 9.9  NEUTROABS 6.6  HGB 11.2*  HCT 34.7*  MCV 87.8  PLT 402*   Cardiac Enzymes: No results for input(s): CKTOTAL, CKMB, CKMBINDEX, TROPONINI in the last 168 hours.  CBG:  Recent Labs Lab 11/25/14 1827  GLUCAP 267*     Radiological Exams on Admission: Dg Chest 2 View  11/25/2014   CLINICAL DATA:  Acute onset of generalized weakness and chest pain. Subacute onset of nausea. Shortness of breath. Initial encounter.  EXAM: CHEST  2 VIEW  COMPARISON:  Chest radiograph from 09/18/2014  FINDINGS: The lungs are well-aerated. Mild vascular congestion is noted. Mild bibasilar atelectasis is seen. There is no evidence of pleural effusion or pneumothorax.  The heart is normal in size; the mediastinal contour is within normal limits. No acute osseous abnormalities are seen.  IMPRESSION: Mild vascular congestion noted.  Mild bibasilar atelectasis seen.   Electronically Signed   By: Garald Balding  M.D.   On: 11/25/2014 19:17    EKG: Independently reviewed. (NSR with RBBB).   Assessment/Plan Present on Admission:  . Failure to thrive in adult . Acute renal failure superimposed on stage 4 chronic kidney disease . GERD . Essential hypertension  PLAN:  WIll admit him for AKI on CKD, with volume depletion in the setting of CHF.   He received IVF in the ER, and I would just hold his lasix, rather than giving him more fluid.  Folllow his Cr carefully, and hold his ACE I for now as well.  Will hold his sedative meds.  I would like to consult social service to look into the home situation, as he does live alone, and may need to go to a rehab.  Brother said his daughter is the one making all the decisions.  He is stable, and will be admitted to hospitalist service.  He is a full code.  Thank you for allowing me to participate in his care.   Other plans as per orders.  Code Status: FULL Haskel Khan, MD. [facility] Pager [phone] 7pm to 7am.  11/25/2014, 9:53 PM

## 2014-11-25 NOTE — ED Notes (Signed)
Patient provided urinal and instructed to give specimen when able.

## 2014-11-25 NOTE — ED Notes (Addendum)
Patient reports generalized weakness with shortness of breath x1 week. Patient has feelings of impending dome states "I just don't want to be alone. I'm scared something bad will happen to me." Per patient has to eat every 2 hours and then feels nauseated afterwards. Patient is diabetic-blood sugar 267. Denies any pain.

## 2014-11-26 ENCOUNTER — Inpatient Hospital Stay (HOSPITAL_COMMUNITY): Payer: Medicare Other

## 2014-11-26 ENCOUNTER — Other Ambulatory Visit (HOSPITAL_COMMUNITY): Payer: Medicare Other

## 2014-11-26 DIAGNOSIS — I5033 Acute on chronic diastolic (congestive) heart failure: Secondary | ICD-10-CM

## 2014-11-26 DIAGNOSIS — I509 Heart failure, unspecified: Secondary | ICD-10-CM

## 2014-11-26 LAB — CBC
HCT: 34.1 % — ABNORMAL LOW (ref 39.0–52.0)
Hemoglobin: 10.8 g/dL — ABNORMAL LOW (ref 13.0–17.0)
MCH: 27.8 pg (ref 26.0–34.0)
MCHC: 31.7 g/dL (ref 30.0–36.0)
MCV: 87.7 fL (ref 78.0–100.0)
Platelets: 347 10*3/uL (ref 150–400)
RBC: 3.89 MIL/uL — ABNORMAL LOW (ref 4.22–5.81)
RDW: 14.4 % (ref 11.5–15.5)
WBC: 9.5 10*3/uL (ref 4.0–10.5)

## 2014-11-26 LAB — COMPREHENSIVE METABOLIC PANEL
ALBUMIN: 3.1 g/dL — AB (ref 3.5–5.0)
ALT: 14 U/L — AB (ref 17–63)
ANION GAP: 10 (ref 5–15)
AST: 18 U/L (ref 15–41)
Alkaline Phosphatase: 40 U/L (ref 38–126)
BUN: 45 mg/dL — ABNORMAL HIGH (ref 6–20)
CO2: 26 mmol/L (ref 22–32)
Calcium: 8.6 mg/dL — ABNORMAL LOW (ref 8.9–10.3)
Chloride: 106 mmol/L (ref 101–111)
Creatinine, Ser: 3.19 mg/dL — ABNORMAL HIGH (ref 0.61–1.24)
GFR calc non Af Amer: 17 mL/min — ABNORMAL LOW (ref >60)
GFR, EST AFRICAN AMERICAN: 20 mL/min — AB (ref >60)
Glucose, Bld: 177 mg/dL — ABNORMAL HIGH (ref 65–99)
Potassium: 3.6 mmol/L (ref 3.5–5.1)
SODIUM: 142 mmol/L (ref 135–145)
Total Bilirubin: 0.4 mg/dL (ref 0.3–1.2)
Total Protein: 6.3 g/dL — ABNORMAL LOW (ref 6.5–8.1)

## 2014-11-26 LAB — GLUCOSE, CAPILLARY
GLUCOSE-CAPILLARY: 214 mg/dL — AB (ref 65–99)
GLUCOSE-CAPILLARY: 161 mg/dL — AB (ref 65–99)
GLUCOSE-CAPILLARY: 222 mg/dL — AB (ref 65–99)
Glucose-Capillary: 167 mg/dL — ABNORMAL HIGH (ref 65–99)
Glucose-Capillary: 177 mg/dL — ABNORMAL HIGH (ref 65–99)
Glucose-Capillary: 234 mg/dL — ABNORMAL HIGH (ref 65–99)

## 2014-11-26 LAB — TSH: TSH: 1.926 u[IU]/mL (ref 0.350–4.500)

## 2014-11-26 MED ORDER — FUROSEMIDE 10 MG/ML IJ SOLN
40.0000 mg | Freq: Two times a day (BID) | INTRAMUSCULAR | Status: DC
  Administered 2014-11-26 – 2014-11-27 (×2): 40 mg via INTRAVENOUS
  Filled 2014-11-26 (×2): qty 4

## 2014-11-26 NOTE — Progress Notes (Addendum)
PROGRESS NOTE  John [name] John [name] KDX:[id number] DOB: Aug 18, 1930 DOA: 11/25/2014 PCP: Purvis Kilts, MD  HPI/Recap of past 24 hours: Poor historian admitted with progressive generalized weakness, DOE, chronic lower extremity edema.  Assessment/Plan: Principal Problem:   Failure to thrive in adult Active Problems:   Essential hypertension   GERD   Weakness generalized   DM type 2 (diabetes mellitus, type 2)   Acute renal failure superimposed on stage 4 chronic kidney disease  CHF? (acute on chronic diastolic) : Does has progressive DOE, lower extremity edema, cxr with mild vascular congestion, ekg NSR, chronic RBBB, patient denies chest pain. Prior echo in 9767 showed diastolic dysfunction, will repeat echo, start IV lasix, strict I/O's.  CKD IV/V, lytes ok, patient has been resistant to the idea of dialysis though his nephrologist told him that he might need it soon. Cr seems at baseline, close monitor cr with iv lasix.  IDDM2, will check a1c, continue insulin  HTN: hold cozaar, continue norvasc/nebivolol  H/o GI bleed with distal small bowel ulcer, stable h/h, no sign of bleed.  Progressive weakness: PT eval  Obesity:  Consider outpatient sleep study if not done.   Code Status: full Family Communication: patient  Disposition Plan: remain in patient   Consultants:  none  Procedures:  none  Antibiotics:  none   Objective: BP 151/56 mmHg  Pulse 65  Temp(Src) 98 F (36.7 C) (Oral)  Resp 18  Ht 5\' 7"  (1.702 m)  Wt 66.679 kg (147 lb)  BMI 23.02 kg/m2  SpO2 97%  Intake/Output Summary (Last 24 hours) at 11/26/14 1009 Last data filed at 11/26/14 0900  Gross per 24 hour  Intake    600 ml  Output    800 ml  Net   -200 ml   Filed Weights   11/25/14 1830 11/26/14 0004  Weight: 110.678 kg (244 lb) 66.679 kg (147 lb)    Exam:   General:  NAD  Cardiovascular: RRR  Respiratory: diminished, but no obvious wheezing or rales  Abdomen: Soft/ND/NT,  positive BS  Musculoskeletal:  1+ pitting Edema bilateral lower legs  Neuro: AAOX3, no focal findings  Data Reviewed: Basic Metabolic Panel:  Recent Labs Lab 11/25/14 1855 11/26/14 0618  NA 139 142  K 3.7 3.6  CL 103 106  CO2 25 26  GLUCOSE 267* 177*  BUN 47* 45*  CREATININE 3.49* 3.19*  CALCIUM 8.8* 8.6*   Liver Function Tests:  Recent Labs Lab 11/25/14 1855 11/26/14 0618  AST 19 18  ALT 16* 14*  ALKPHOS 48 40  BILITOT 0.4 0.4  PROT 6.9 6.3*  ALBUMIN 3.5 3.1*   No results for input(s): LIPASE, AMYLASE in the last 168 hours. No results for input(s): AMMONIA in the last 168 hours. CBC:  Recent Labs Lab 11/25/14 1855 11/26/14 0618  WBC 9.9 9.5  NEUTROABS 6.6  --   HGB 11.2* 10.8*  HCT 34.7* 34.1*  MCV 87.8 87.7  PLT 402* 347   Cardiac Enzymes:   No results for input(s): CKTOTAL, CKMB, CKMBINDEX, TROPONINI in the last 168 hours. BNP (last 3 results)  Recent Labs  11/25/14 1855  BNP 66.0    ProBNP (last 3 results)  Recent Labs  01/27/14 1923 02/12/14 1118  PROBNP 2210.0* 507.3*    CBG:  Recent Labs Lab 11/25/14 1827 11/26/14 0020 11/26/14 0743  GLUCAP 267* 234* 167*    No results found for this or any previous visit (from the past 240 hour(s)).   Studies: Dg Chest  2 View  11/25/2014   CLINICAL DATA:  Acute onset of generalized weakness and chest pain. Subacute onset of nausea. Shortness of breath. Initial encounter.  EXAM: CHEST  2 VIEW  COMPARISON:  Chest radiograph from 09/18/2014  FINDINGS: The lungs are well-aerated. Mild vascular congestion is noted. Mild bibasilar atelectasis is seen. There is no evidence of pleural effusion or pneumothorax.  The heart is normal in size; the mediastinal contour is within normal limits. No acute osseous abnormalities are seen.  IMPRESSION: Mild vascular congestion noted.  Mild bibasilar atelectasis seen.   Electronically Signed   By: Garald Balding M.D.   On: 11/25/2014 19:17    Scheduled Meds: .  amLODipine  7.5 mg Oral Daily  . finasteride  5 mg Oral Daily  . furosemide  40 mg Intravenous BID  . heparin  5,000 Units Subcutaneous 3 times per day  . insulin aspart  0-15 Units Subcutaneous TID WC  . insulin aspart  0-5 Units Subcutaneous QHS  . insulin aspart protamine- aspart  30 Units Subcutaneous BID WC  . nebivolol  5 mg Oral [id number]  . tamsulosin  0.4 mg Oral Daily  . terazosin  10 mg Oral QHS    Continuous Infusions:    Time spent: 43mins  Ned Kakar MD, PhD  [facility] Pager [phone]. If 7PM-7AM, please contact night-coverage at [email or web address], password [facility] 11/26/2014, 10:09 AM  LOS: 1 day

## 2014-11-26 NOTE — Evaluation (Addendum)
Physical Therapy Evaluation Patient Details Name: SIDDHARTHA HOBACK MRN: [id number] DOB: Jul 25, 1930 Today's Date: 11/26/2014   History of Present Illness  Yahsir Wickens Vidovich is an 81 y.o. male with hx of DM, prior GI bleed, hx of CVA, HTN, CKD, HLD, neprholithiasis, lives at home with help from his brother, presented to the ER complaining of feeling weak. He hasn't been out of his bed, but other than that, no specific complaints, i.e, he has had no fever, chills, abdominal pain, diarrhea, chest pain, SOB, or HA. His 2 brothers who have been at this bedside said his daughter is the one responsible for his care. She sees him rarely according to them. He can't get to the grocery store or cook for himself unless he has help. Evaluation in the ER included a Cr of 3.4, normal K, and BS of 267. The Cr is not far from his baseline a couple of months ago. His UA showed no infection, and CXR showed some vascular congestion. EDP felt that he volume depleted, and hospitalist was asked to admit him for dehydration, in the setting of CHF, and failure to thrive, with AKI on CKD.   Clinical Impression    Patient found sitting upright in chair with CNA present, having just finished bathing. Patient confirms that he lives alone by himself with his daughter checking in on him maybe once a week, not very often; he states that he has a cane and walker that he only uses as he feels he needs. He also voices concern that since he is alone so much he will fall at home and that no one will find him for days ; overall patient does appear to be every concerned about how much he is alone at home- Education officer, museum informed. Noted that patient has tendency to furniture and wall walk when he is not using assistive device. Able to perform toileting today with supervision-Min guard. Ambulated approx 77f with standard walker and some mild fatigue noted. Overall patient states he just does not feel as strong as he was before he came to the  hospital. At this time recommend SNF. Patient left sitting upright in chair with all needs met, reminded patient not to attempt to get up by himself in order to reduce risk of fall.   Follow Up Recommendations SNF    Equipment Recommendations  None recommended by PT    Recommendations for Other Services OT consult     Precautions / Restrictions Precautions Precautions: Fall Precaution Comments: poor vision  Restrictions Weight Bearing Restrictions: No      Mobility  Bed Mobility               General bed mobility comments: DNT today   Transfers Overall transfer level: Needs assistance Equipment used: Standard walker Transfers: Sit to/from Stand Sit to Stand: Min guard         General transfer comment: needs to rock a bit to come to stand without device   Ambulation/Gait Ambulation/Gait assistance: Min guard Ambulation Distance (Feet): 80 Feet Assistive device: Standard walker Gait Pattern/deviations: Step-through pattern;Decreased step length - right;Decreased step length - left;Decreased dorsiflexion - right;Decreased dorsiflexion - left;Trunk flexed        Stairs            Wheelchair Mobility    Modified Rankin (Stroke Patients Only)       Balance Overall balance assessment: Needs assistance Sitting-balance support: No upper extremity supported;Feet supported Sitting balance-Leahy Scale: Good     Standing  balance support: No upper extremity supported Standing balance-Leahy Scale: Fair Standing balance comment: fair balance without assitivie device, shows tendency to furntiure walk; approxmiately fair+/good- balance with standard walker                              Pertinent Vitals/Pain Pain Assessment: No/denies pain    Home Living Family/patient expects to be discharged to:: Private residence Living Arrangements: Alone Available Help at Discharge: Family;Available PRN/intermittently Type of Home: House Home Access:  Stairs to enter (5 in the back with B railings, which is patient's preferred enterance/exit ; 2 stairs in front ) Entrance Stairs-Rails: None     Home Equipment: Walker - 2 wheels;Cane - single point Additional Comments: carries cane with him and uses it if he feels he needs it; usually doesn't use walker     Prior Function Level of Independence: Independent with assistive device(s)         Comments: Pt reports he has AD, though only uses them as needed.      Hand Dominance        Extremity/Trunk Assessment   Upper Extremity Assessment: Defer to OT evaluation           Lower Extremity Assessment: Generalized weakness      Cervical / Trunk Assessment: Normal  Communication   Communication: No difficulties  Cognition Arousal/Alertness: Awake/alert Behavior During Therapy: WFL for tasks assessed/performed Overall Cognitive Status: Within Functional Limits for tasks assessed                      General Comments      Exercises        Assessment/Plan    PT Assessment Patient needs continued PT services  PT Diagnosis Generalized weakness;Other (comment) (reduced functional activity tolerance )   PT Problem List Decreased strength;Decreased coordination;Decreased activity tolerance;Decreased knowledge of use of DME;Decreased balance;Decreased safety awareness;Decreased mobility  PT Treatment Interventions DME instruction;Therapeutic exercise;Gait training;Balance training;Stair training;Neuromuscular re-education;Functional mobility training;Therapeutic activities;Patient/family education   PT Goals (Current goals can be found in the Care Plan section) Acute Rehab PT Goals Patient Stated Goal: to get stronger, not to fall  PT Goal Formulation: With patient Time For Goal Achievement: 12/10/14 Potential to Achieve Goals: Fair    Frequency Min 3X/week   Barriers to discharge Other (comment) patient relays that he is very concerned, that since he does  not get visitors or calls very often, that something will happen to him at home and he'll be laying on the floor for days     Co-evaluation               End of Session   Activity Tolerance: Patient tolerated treatment well Patient left: in chair;with call bell/phone within reach      Functional Limitation: Mobility: Walking and moving around Mobility: Walking and Moving Around Current Status (Y3016): At least 20 percent but less than 40 percent impaired, limited or restricted Mobility: Walking and Moving Around Goal Status [phone]): At least 1 percent but less than 20 percent impaired, limited or restricted    Time: 1001-1039 PT Time Calculation (min) (ACUTE ONLY): 38 min   Charges:         PT G Codes:   PT G-Codes **NOT FOR INPATIENT CLASS** Functional Limitation: Mobility: Walking and moving around Mobility: Walking and Moving Around Current Status (A3557): At least 20 percent but less than 40 percent impaired, limited or restricted Mobility: Walking  and Moving Around Goal Status [phone]): At least 1 percent but less than 20 percent impaired, limited or restricted    Deniece Ree PT, DPT [phone]  5/31- addended clinical impression section and DC recommendation section

## 2014-11-26 NOTE — Clinical Social Work Note (Signed)
CSW attempted to meet with pt to discuss d/c plans as order received for possible SNF, but pt having echo done. Will follow up later today or tomorrow.  Benay Pike, [place]

## 2014-11-27 DIAGNOSIS — R531 Weakness: Secondary | ICD-10-CM

## 2014-11-27 DIAGNOSIS — R627 Adult failure to thrive: Secondary | ICD-10-CM

## 2014-11-27 LAB — HEMOGLOBIN A1C
HEMOGLOBIN A1C: 8.5 % — AB (ref 4.8–5.6)
MEAN PLASMA GLUCOSE: 197 mg/dL

## 2014-11-27 LAB — GLUCOSE, CAPILLARY
GLUCOSE-CAPILLARY: 149 mg/dL — AB (ref 65–99)
Glucose-Capillary: 139 mg/dL — ABNORMAL HIGH (ref 65–99)
Glucose-Capillary: 230 mg/dL — ABNORMAL HIGH (ref 65–99)
Glucose-Capillary: 173 mg/dL — ABNORMAL HIGH (ref 65–99)

## 2014-11-27 MED ORDER — ONDANSETRON HCL 4 MG/2ML IJ SOLN
4.0000 mg | Freq: Four times a day (QID) | INTRAMUSCULAR | Status: DC | PRN
  Administered 2014-11-27 (×2): 4 mg via INTRAVENOUS
  Filled 2014-11-27 (×2): qty 2

## 2014-11-27 MED ORDER — PANTOPRAZOLE SODIUM 40 MG PO TBEC
40.0000 mg | DELAYED_RELEASE_TABLET | Freq: Every day | ORAL | Status: DC
  Administered 2014-11-27 – 2014-11-28 (×2): 40 mg via ORAL
  Filled 2014-11-27 (×2): qty 1

## 2014-11-27 MED ORDER — FUROSEMIDE 40 MG PO TABS
40.0000 mg | ORAL_TABLET | Freq: Two times a day (BID) | ORAL | Status: DC
  Administered 2014-11-27 – 2014-11-28 (×2): 40 mg via ORAL
  Filled 2014-11-27 (×2): qty 1

## 2014-11-27 NOTE — Clinical Social Work Note (Addendum)
Clinical Social Work Assessment  Patient Details  Name: John [name] MRN: [id number] Date of Birth: 1930/07/16  Date of referral:  11/27/14               Reason for consult:  Facility Placement                Permission sought to share information with:  Family Supports Permission granted to share information::  Yes, Verbal Permission Granted  Name::     Engineer, manufacturing::     Relationship::  daughter  Contact Information:     Housing/Transportation Living arrangements for the past 2 months:  Single Family Home Source of Information:  Patient, Adult Children Patient Interpreter Needed:  None Criminal Activity/Legal Involvement Pertinent to Current Situation/Hospitalization:    Significant Relationships:  Siblings, Adult Children Lives with:  Self Do you feel safe going back to the place where you live?  Yes Need for family participation in patient care:  Yes (Comment)  Care giving concerns:  Pt lives alone, but has good support from daughter and brother.    Social Worker assessment / plan:  CSW met with pt at bedside. Pt alert and oriented and reports that he lives alone. His daughter, John [name] lives about an hour away and his brother, John [name] is about 15 miles from him. Both are very involved and supportive. Pt indicates he manages okay at home and is independent with ADLs. He ambulates with a cane. Pt stopped driving because he was scared he would cause an accident. He states that his family takes him to the store and to appointments. They also bring in food for him if needed. PT evaluated pt yesterday and recommended SNF. CSW discussed placement process and provided SNF list. Pt aware of Medicare coverage/criteria. He states, "I don't know" when asked his opinion on going to rehab. He said that he has been before but didn't stay long enough to even start therapy. Pt agreed to leave decision up to [name]. CSW discussed plan with John [name] and she requests that he return home with home health due to  past history at SNF. CSW will notify CM and sign off.   Employment status:  Retired Forensic scientist:  Medicare PT Recommendations:  [place] / Referral to community resources:  [place]  Patient/Family's Response to care:  Pt deferred decision to daughter regarding SNF. Per John [name], pt has been to two different SNFs in the past and left both of them in a very short period of time. One facility he stayed at overnight and the other just a few hours. John [name] requests home health as she knows that her father will not stay to receive any therapy anyway. She describes her father as very "stubborn" and said he eats what he wants to eat despite recommendations. He tends to be very sedentary and needs a lot of motivation.    Patient/Family's Understanding of and Emotional Response to Diagnosis, Current Treatment, and Prognosis:  Pt shared that dialysis has been discussed with him, but he refuses to start dialysis. Pt has seen how this impacted other family/friends and he does not want to deal with that.   Emotional Assessment Appearance:  Appears stated age Attitude/Demeanor/Rapport:  Other (Cooperative) Affect (typically observed):  Appropriate Orientation:  Oriented to Self, Oriented to Place, Oriented to  Time, Oriented to Situation Alcohol / Substance use:  Not Applicable Psych involvement (Current and /or in the community):  No (Comment)  Discharge Needs  Concerns to  be addressed:  Discharge Planning Concerns Readmission within the last 30 days:  No Current discharge risk:  Lives alone Barriers to Discharge:  No Barriers Identified   Salome Arnt, [place] 11/27/2014, 3:37 PM [phone]

## 2014-11-27 NOTE — Progress Notes (Signed)
[facility] PROGRESS NOTE  John [name] VEH:[id number] DOB: January 27, 1931 DOA: 11/25/2014 PCP: Purvis Kilts, MD  Assessment/Plan: Acute renal failure superimposed on stage 4 chronic kidney disease: in setting of diuretics. Creatinine improved slightly this am. Urine output good. Patient states he is not interested in going on dialysis as has been suggested to him in past. Continue with IV lasix for now  Acute on chronic diastolic heart failure: vascular congestion on xray, LE edema. Volume status -1L. Await echo results yield moderate concentric hypertrophy. Systolic function was normal. Theestimated ejection fraction was in the range of 60% to 65%. Wallmotion was normal; there were no regional wall motion. Will keep IV lasix at current dose for another day.  Continue to monitor intake and output and obtain daily weights.cotinue BB.     Essential hypertension: fair control. Home meds include ARB and CCB. Holding ARB for now due to above.   DM type 2 (diabetes mellitus, type 2): await A1c. Home meds include humalog 75/25. CBG's trending up slightly. Continue SSI as well as home regimen  Anemia: hx of GI bleed 01/2014 (thought secondary to small bowel ulcers, distal. Underwent workup including small bowel capsule study and ileal colonoscopy) as well as Upper GI bleed and IDA. EGD and colonoscopy 2012 and capsule study at that time found gastric AVM. Currently trending down slightly. monitor    GERD: stable at baseline    Weakness generalized/ Failure to thrive in adult: likely related to all of above. Will check TSH and B12. Evaluated by PT who recommend SNF.    Code Status: full Family Communication: none present Disposition Plan: snf hopefully tomorrow   Consultants:  none  Procedures:  none  Antibiotics:   none  HPI/Subjective: Lying in bed. Denies pain. Complains nausea after eating  Objective: Filed Vitals:   11/27/14 0612  BP: 147/71  Pulse: 70  Temp:  98.3 F (36.8 C)  Resp: 18    Intake/Output Summary (Last 24 hours) at 11/27/14 1137 Last data filed at 11/27/14 1028  Gross per 24 hour  Intake    720 ml  Output   1575 ml  Net   -855 ml   Filed Weights   11/25/14 1830 11/26/14 0004 11/27/14 0612  Weight: 110.678 kg (244 lb) 66.679 kg (147 lb) 109 kg (240 lb 4.8 oz)    Exam:   General:  Obese. Appears comfortable  Cardiovascular: RRR no MGR trace LE edema  Respiratory: normal effort BS clear bilaterally   Abdomen: obese soft sluggish BS non-tender  Musculoskeletal: no clubbing or cyanosis   Data Reviewed: Basic Metabolic Panel:  Recent Labs Lab 11/25/14 1855 11/26/14 0618  NA 139 142  K 3.7 3.6  CL 103 106  CO2 25 26  GLUCOSE 267* 177*  BUN 47* 45*  CREATININE 3.49* 3.19*  CALCIUM 8.8* 8.6*   Liver Function Tests:  Recent Labs Lab 11/25/14 1855 11/26/14 0618  AST 19 18  ALT 16* 14*  ALKPHOS 48 40  BILITOT 0.4 0.4  PROT 6.9 6.3*  ALBUMIN 3.5 3.1*   No results for input(s): LIPASE, AMYLASE in the last 168 hours. No results for input(s): AMMONIA in the last 168 hours. CBC:  Recent Labs Lab 11/25/14 1855 11/26/14 0618  WBC 9.9 9.5  NEUTROABS 6.6  --   HGB 11.2* 10.8*  HCT 34.7* 34.1*  MCV 87.8 87.7  PLT 402* 347   Cardiac Enzymes: No results for input(s): CKTOTAL, CKMB, CKMBINDEX, TROPONINI in the last 168 hours. BNP (  last 3 results)  Recent Labs  11/25/14 1855  BNP 66.0    ProBNP (last 3 results)  Recent Labs  01/27/14 1923 02/12/14 1118  PROBNP 2210.0* 507.3*    CBG:  Recent Labs Lab 11/26/14 1106 11/26/14 1213 11/26/14 1617 11/26/14 2151 11/27/14 0746  GLUCAP 214* 177* 161* 222* 139*    No results found for this or any previous visit (from the past 240 hour(s)).   Studies: Dg Chest 2 View  11/25/2014   CLINICAL DATA:  Acute onset of generalized weakness and chest pain. Subacute onset of nausea. Shortness of breath. Initial encounter.  EXAM: CHEST  2 VIEW   COMPARISON:  Chest radiograph from 09/18/2014  FINDINGS: The lungs are well-aerated. Mild vascular congestion is noted. Mild bibasilar atelectasis is seen. There is no evidence of pleural effusion or pneumothorax.  The heart is normal in size; the mediastinal contour is within normal limits. No acute osseous abnormalities are seen.  IMPRESSION: Mild vascular congestion noted.  Mild bibasilar atelectasis seen.   Electronically Signed   By: Garald Balding M.D.   On: 11/25/2014 19:17    Scheduled Meds: . amLODipine  7.5 mg Oral Daily  . finasteride  5 mg Oral Daily  . furosemide  40 mg Oral BID  . heparin  5,000 Units Subcutaneous 3 times per day  . insulin aspart  0-15 Units Subcutaneous TID WC  . insulin aspart  0-5 Units Subcutaneous QHS  . insulin aspart protamine- aspart  30 Units Subcutaneous BID WC  . nebivolol  5 mg Oral BH-q7a  . pantoprazole  40 mg Oral Daily  . tamsulosin  0.4 mg Oral Daily  . terazosin  10 mg Oral QHS   Continuous Infusions:   Principal Problem:     Time spent: 30 minutes    [facility] Pager [phone]. If 7PM-7AM, please contact night-coverage at [email or web address], password [facility] 11/27/2014, 11:37 AM  LOS: 2 days     I have taken an interval history, reviewed the chart and examined the patient. I agree with the Advanced Practice Provider's note, impression and recommendations. I have made any necessary editorial changes. Will change the IV Lasix to by mouth Follow BMP in a.m.

## 2014-11-27 NOTE — Progress Notes (Signed)
Physical Therapy Treatment Patient Details Name: John [name] MRN: [id number] DOB: 1930/08/20 Today's Date: 11/27/2014    History of Present Illness John [name] is an 81 y.o. male with hx of DM, prior GI bleed, hx of CVA, HTN, CKD, HLD, neprholithiasis, lives at home with help from his brother, presented to the ER complaining of feeling weak. He hasn't been out of his bed, but other than that, no specific complaints, i.e, he has had no fever, chills, abdominal pain, diarrhea, chest pain, SOB, or HA. His 2 brothers who have been at this bedside said his daughter is the one responsible for his care. She sees him rarely according to them. He can't get to the grocery store or cook for himself unless he has help. Evaluation in the ER included a Cr of 3.4, normal K, and BS of 267. The Cr is not far from his baseline a couple of months ago. His UA showed no infection, and CXR showed some vascular congestion. EDP felt that he volume depleted, and hospitalist was asked to admit him for dehydration, in the setting of CHF, and failure to thrive, with AKI on CKD.     PT Comments     Pt tolerating treatment session well, motivated and able to complete most of sesssion as planned, but unwilling to attempt stairs in spite of multiple attempts to motivate. Pt continues to make progress toward goals as evidenced by improved ambulation distance, use of less restrictive assistive device and inclusion of some balance training. Pt's greatest limitation continues to be activity tolerance which continues to limit ability to perform safe household mobility at baseline function, including but not limited to safe entry/exit of home. Patient presenting with impairment of strength, pain, balance, and activity tolerance, limiting ability to perform ADL and mobility tasks at  baseline level of function. Patient will benefit from skilled intervention to address the above impairments and limitations, in order to restore  to prior level of function, improve patient safety upon discharge, and to decrease caregiver burden.     Follow Up Recommendations  SNF     Equipment Recommendations  None recommended by PT    Recommendations for Other Services OT consult     Precautions / Restrictions Precautions Precautions: Fall Precaution Comments: poor vision  Restrictions Weight Bearing Restrictions: No    Mobility  Bed Mobility Overal bed mobility: +2 for physical assistance             General bed mobility comments: Able to partially log-roll and get legs out of bed, but requires BUE HHA from trunk righting/flexion. Once seated at EOB, able to maintain static balance.   Transfers Overall transfer level: Needs assistance Equipment used: Straight cane Transfers: Sit to/from Stand Sit to Stand: Min guard         General transfer comment: Able to perform once to exit bed, and then completed 3x6 from chair, with 2 minutes rest between bouts. Pt reportedly felt weak in legs and SOB with activity, but reports that it is no worse than baseline. Demonstrates safe use of SPC with activity.   Ambulation/Gait Ambulation/Gait assistance: Min guard Ambulation Distance (Feet): 160 Feet Assistive device: Straight cane Gait Pattern/deviations: Step-through pattern Gait velocity: .78m/s  Gait velocity interpretation: <1.8 ft/sec, indicative of risk for recurrent falls General Gait Details: Pt ambulated 58ft 2x with standing rest break between, due to c/o weakness in legs. Pt expresses concern with doing too much, it may make him weaker.   Stairs Stairs:  (  Multiple attemps to motivate patient to attempt stairs. Pt unwilling to attempt reporting worsening weaekness, and fear that unable to perform. )          Wheelchair Mobility    Modified Rankin (Stroke Patients Only)       Balance Overall balance assessment: Needs assistance Sitting-balance support: No upper extremity supported;Feet  supported Sitting balance-Leahy Scale: Good Sitting balance - Comments: Limited trunk excursion secondary to weakness in trunk.    Standing balance support: Single extremity supported Standing balance-Leahy Scale: Fair Standing balance comment: Able to withstand moderate perturbation at shoulder heighht, 5x ant, post, R, and L. Pt reportedly dizzy and exerted thereafter.                     Cognition Arousal/Alertness: Awake/alert Behavior During Therapy: WFL for tasks assessed/performed Overall Cognitive Status: Within Functional Limits for tasks assessed                      Exercises      General Comments        Pertinent Vitals/Pain Pain Assessment: No/denies pain    Home Living                      Prior Function            PT Goals (current goals can now be found in the care plan section) Acute Rehab PT Goals Patient Stated Goal: to get stronger, not to fall  PT Goal Formulation: With patient Time For Goal Achievement: 12/10/14 Potential to Achieve Goals: Fair Progress towards PT goals: Progressing toward goals    Frequency  Min 3X/week    PT Plan Current plan remains appropriate    Co-evaluation             End of Session Equipment Utilized During Treatment: Gait belt Activity Tolerance: Patient limited by fatigue (Pt tolerating activity well with plenty of breaks, but unable to attempts stairs at home. )       Time: 4680-3212 PT Time Calculation (min) (ACUTE ONLY): 28 min  Charges:  $Therapeutic Exercise: 8-22 mins $Therapeutic Activity: 8-22 mins                    G Codes:      [name],[name] December 09, 2014, 1:53 PM 1:57 PM  John [name], PT, DPT [facility] License # [id number]

## 2014-11-28 DIAGNOSIS — N179 Acute kidney failure, unspecified: Principal | ICD-10-CM

## 2014-11-28 DIAGNOSIS — N400 Enlarged prostate without lower urinary tract symptoms: Secondary | ICD-10-CM

## 2014-11-28 DIAGNOSIS — I10 Essential (primary) hypertension: Secondary | ICD-10-CM

## 2014-11-28 DIAGNOSIS — N184 Chronic kidney disease, stage 4 (severe): Secondary | ICD-10-CM

## 2014-11-28 DIAGNOSIS — E118 Type 2 diabetes mellitus with unspecified complications: Secondary | ICD-10-CM

## 2014-11-28 LAB — BASIC METABOLIC PANEL
ANION GAP: 11 (ref 5–15)
BUN: 44 mg/dL — ABNORMAL HIGH (ref 6–20)
CHLORIDE: 104 mmol/L (ref 101–111)
CO2: 27 mmol/L (ref 22–32)
CREATININE: 3.5 mg/dL — AB (ref 0.61–1.24)
Calcium: 8.9 mg/dL (ref 8.9–10.3)
GFR calc non Af Amer: 15 mL/min — ABNORMAL LOW (ref >60)
GFR, EST AFRICAN AMERICAN: 17 mL/min — AB (ref >60)
Glucose, Bld: 147 mg/dL — ABNORMAL HIGH (ref 65–99)
POTASSIUM: 3.8 mmol/L (ref 3.5–5.1)
SODIUM: 142 mmol/L (ref 135–145)

## 2014-11-28 LAB — CBC
HCT: 36.2 % — ABNORMAL LOW (ref 39.0–52.0)
Hemoglobin: 11.3 g/dL — ABNORMAL LOW (ref 13.0–17.0)
MCH: 27.7 pg (ref 26.0–34.0)
MCHC: 31.2 g/dL (ref 30.0–36.0)
MCV: 88.7 fL (ref 78.0–100.0)
PLATELETS: 374 10*3/uL (ref 150–400)
RBC: 4.08 MIL/uL — ABNORMAL LOW (ref 4.22–5.81)
RDW: 14.5 % (ref 11.5–15.5)
WBC: 9 10*3/uL (ref 4.0–10.5)

## 2014-11-28 LAB — GLUCOSE, CAPILLARY
GLUCOSE-CAPILLARY: 175 mg/dL — AB (ref 65–99)
Glucose-Capillary: 139 mg/dL — ABNORMAL HIGH (ref 65–99)
Glucose-Capillary: 139 mg/dL — ABNORMAL HIGH (ref 65–99)

## 2014-11-28 MED ORDER — ONDANSETRON HCL 4 MG PO TABS
4.0000 mg | ORAL_TABLET | Freq: Three times a day (TID) | ORAL | Status: DC
  Administered 2014-11-28: 4 mg via ORAL
  Filled 2014-11-28: qty 1

## 2014-11-28 NOTE — Progress Notes (Signed)
Initial visit with patient for support. Patient was very expressive-about life history, faith, relationships. We did speak briefly about his decision making process around dialysis. He shared he felt he reached some clarity about this decision. He stated he made a decision that he didn't want to be kept alive on a machine. We discussed how dialysis would be for him.

## 2014-11-28 NOTE — Care Management Note (Signed)
Case Management Note  Patient Details  Name: John [name] MRN: [id number] Date of Birth: 1931/04/30  Subjective/Objective:                    Action/Plan:   Expected Discharge Date:                  Expected Discharge Plan:  [name]  In-House Referral:  NA  Discharge planning Services  CM Consult  Post Acute Care Choice:  Home Health Choice offered to:  Adult Children  DME Arranged:    DME Agency:     HH Arranged:  RN, PT, Nurse's Aide (CSW) HH Agency:  [facility]  Status of Service:  Completed, signed off  Medicare Important Message Given:  Yes Date Medicare IM Given:  11/28/14 Medicare IM give by:  Christinia Gully, RN BSN CM Date Additional Medicare IM Given:    Additional Medicare Important Message give by:     If discussed at [place] of Stay Meetings, dates discussed:    Additional Comments: Pt discharged home today with [facility] RN, PT, aide and CSW (per pts choice). Romualdo Bolk of [facility] is aware of the referral. [facility] services to start within 48 hours of discharge. Pt and pts nurse aware of discharge arrangements. Christinia Gully [place], RN 11/28/2014, 2:17 PM

## 2014-11-28 NOTE — Progress Notes (Signed)
Physical Therapy Treatment Patient Details Name: John [name] MRN: [id number] DOB: 1930/09/15 Today's Date: 11/28/2014    History of Present Illness John [name] is an 81 y.o. male with hx of DM, prior GI bleed, hx of CVA, HTN, CKD, HLD, neprholithiasis, lives at home with help from his brother, presented to the ER complaining of feeling weak. He hasn't been out of his bed, but other than that, no specific complaints, i.e, he has had no fever, chills, abdominal pain, diarrhea, chest pain, SOB, or HA. His 2 brothers who have been at this bedside said his daughter is the one responsible for his care. She sees him rarely according to them. He can't get to the grocery store or cook for himself unless he has help. Evaluation in the ER included a Cr of 3.4, normal K, and BS of 267. The Cr is not far from his baseline a couple of months ago. His UA showed no infection, and CXR showed some vascular congestion. EDP felt that he volume depleted, and hospitalist was asked to admit him for dehydration, in the setting of CHF, and failure to thrive, with AKI on CKD.     PT Comments    Pt tolerating treatment session well, moderately motivated and able to complete entire PT sesssion as planned. Pt continues to make progress toward goals as evidenced by greater activity tolerance as well as inclusion of stairs. Pt's greatest limitation continues to be limitations in activity tolerance which continues to limit ability to perform safe ambulation at limited community distances. Patient presenting with impairment of strength, balance, and activity tolerance, limiting ability to perform ADL and mobility tasks at  baseline level of function. Patient will benefit from skilled intervention to address the above impairments and limitations, in order to restore to prior level of function, improve patient safety upon discharge, and to decrease caregiver burden.    Follow Up Recommendations  Home health PT;Supervision  - Intermittent     Equipment Recommendations  None recommended by PT    Recommendations for Other Services       Precautions / Restrictions Precautions Precautions: Fall Precaution Comments: poor vision  Restrictions Weight Bearing Restrictions: No    Mobility  Bed Mobility Overal bed mobility: +2 for physical assistance             General bed mobility comments: Able to partially log-roll and get legs out of bed, but requires BUE HHA from trunk righting/flexion. Once seated at EOB, able to maintain static balance.   Transfers Overall transfer level: Needs assistance Equipment used: Straight cane Transfers: Sit to/from Stand Sit to Stand: Min guard         General transfer comment: Also performed sit to stand from toilet with grab bars at supervision.   Ambulation/Gait Ambulation/Gait assistance: Min guard Ambulation Distance (Feet): 120 Feet Assistive device: Straight cane Gait Pattern/deviations: Step-through pattern (absent arm swing on left. )     General Gait Details: Pt complaining of less fatigue with ambulation today.    Stairs Stairs: Yes Stairs assistance: Min guard Stair Management: One rail Right;Forwards;With cane Number of Stairs: 10 General stair comments: descends with a L leading step-to gait, demonstrating poor eccentric control on RLE  Wheelchair Mobility    Modified Rankin (Stroke Patients Only)       Balance Overall balance assessment: Modified Independent         Standing balance support: No upper extremity supported;During functional activity Standing balance-Leahy Scale: Good Standing balance comment: Able to  withstand indep standing without AD, while performing hand washing x60 seconds.                     Cognition Arousal/Alertness: Awake/alert Behavior During Therapy: WFL for tasks assessed/performed Overall Cognitive Status: Within Functional Limits for tasks assessed (Pt is very distractable and  conversational, requiring verbal cues to attend to the task at hand. Pt is able to find room indep withotut verbal cues upon return. )                      Exercises      General Comments        Pertinent Vitals/Pain Pain Assessment: No/denies pain    Home Living                      Prior Function            PT Goals (current goals can now be found in the care plan section) Acute Rehab PT Goals Patient Stated Goal: to get stronger, not to fall  PT Goal Formulation: With patient Time For Goal Achievement: 12/10/14 Potential to Achieve Goals: Fair Progress towards PT goals: Progressing toward goals    Frequency  Min 3X/week    PT Plan Discharge plan needs to be updated    Co-evaluation             End of Session Equipment Utilized During Treatment: Gait belt Activity Tolerance: Patient limited by fatigue Patient left: in chair;with call bell/phone within reach     Time: 1478-2956 PT Time Calculation (min) (ACUTE ONLY): 23 min  Charges:  $Therapeutic Activity: 23-37 mins                    G Codes:      Steffan Caniglia C 29-Nov-2014, 10:20 AM  10:22 AM  Etta Grandchild, PT, DPT [place] License # [id number]

## 2014-11-28 NOTE — Care Management Note (Signed)
Case Management Note  Patient Details  Name: John [name] MRN: [id number] Date of Birth: [birth year]  Subjective/Objective:                  Pt admitted from home with ARF. Pt lives alone and will return home at discharge. Pt has a daughter who is very active in the care of the pt. Pt has a cane and walker for home use.   Action/Plan: Pt HH RN, PT, aide, and CSW arranged with [facility] (per pts choice). Romualdo Bolk of [facility] is aware and will collect the pts information from the chart. [place] services to start within 48 hours of discharge. No DME needs noted. Pt and pts nurse aware of discharge arrangements. Expected Discharge Date:                  Expected Discharge Plan:  [place]  In-House Referral:  NA  Discharge planning Services  CM Consult  Post Acute Care Choice:  Home Health Choice offered to:  Adult Children  DME Arranged:    DME Agency:     HH Arranged:  RN, PT, Nurse's Aide (CSW) HH Agency:  [facility]  Status of Service:  Completed, signed off  Medicare Important Message Given:    Date Medicare IM Given:    Medicare IM give by:    Date Additional Medicare IM Given:    Additional Medicare Important Message give by:     If discussed at [place] of Stay Meetings, dates discussed:    Additional Comments:  Joylene Draft, RN 11/28/2014, 11:20 AM

## 2014-11-28 NOTE — Discharge Summary (Signed)
Physician Discharge Summary  John [name] SWF:[id number] DOB: 1930-07-03 DOA: 11/25/2014  PCP: Purvis Kilts, MD  Admit date: 11/25/2014 Discharge date: 11/28/2014  Time spent: 40 minutes  Recommendations for Outpatient Follow-up:  1. Follow up with PCP 1-2 weeks for evaluation of functional status and chronic nausea with meal  2. Home health  Discharge Diagnoses:  Principal Problem:   Acute renal failure superimposed on stage 4 chronic kidney disease Active Problems:   Anemia   Essential hypertension   GERD   Weakness generalized   BPH (benign prostatic hypertrophy)   DM type 2 (diabetes mellitus, type 2)   Failure to thrive in adult   Discharge Condition: stable  Diet recommendation: heart healthy carb modified  Filed Weights   11/27/14 0612 11/27/14 1419 11/28/14 0500  Weight: 109 kg (240 lb 4.8 oz) 109.453 kg (241 lb 4.8 oz) 108.364 kg (238 lb 14.4 oz)    History of present illness:  John [name] is an 81 y.o. male with hx of DM, prior GI bleed, hx of CVA, HTN, CKD, HLD, neprholithiasis, lives at home with help from his brother, presented to the ER on 11/25/14 complaining of feeling weak. He had not been out of his bed, but other than that, no specific complaints, i.e, he has had no fever, chills, abdominal pain, diarrhea, chest pain, SOB, or HA. His 2 brothers who have been at this bedside said his daughter is the one responsible for his care. She sees him rarely according to them. He can't get to the grocery store or cook for himself unless he has help. Evaluation in the ER included a Cr of 3.4, normal K, and BS of 267. The Cr is not far from his baseline a couple of months ago. His UA showed no infection, and CXR showed some vascular congestion. EDP felt that he volume depleted, and hospitalist was asked to admit him for dehydration, in the setting of CHF, and failure to thrive, with AKI on CKD.    Hospital Course:  Acute renal failure superimposed on  stage 4 chronic kidney disease: in setting of diuretics and decreased po intake. Admitted and provided gentle IV hydration. Creatinine at baseline at discharge.  Urine output good. Patient stated he is not interested in going on dialysis as has been suggested to him in past. Continue with IV lasix for now  Acute on chronic diastolic heart failure: vascular congestion on xray, LE edema. Provided with IV lasix.  Volume status -1.4L at discharge. Echo reveals EF 60% and grade 1 diastolic dysfunction.   Essential hypertension: fair control.    DM type 2 (diabetes mellitus, type 2):  A1c 8.5 resume home regimen  Anemia: hx of GI bleed 01/2014 (thought secondary to small bowel ulcers, distal. Underwent workup including small bowel capsule study and ileal colonoscopy) as well as Upper GI bleed and IDA. EGD and colonoscopy 2012 and capsule study at that time found gastric AVM. Hg stable during this hospitalization   GERD: stable at baseline   Weakness generalized/ Failure to thrive in adult: likely related to all of above. TSH within limits of normal. Evaluated by PT who recommend SNF. Patient declined.   Procedures:  Echo 11/26/14 here was moderate concentric hypertrophy. Systolic function was normal. The estimated ejection fraction was in the range of 60% to 65%. Wall motion was normal; there were no regional wall motion abnormalities. Doppler parameters are consistent with abnormal left ventricular relaxation (grade 1 diastolic dysfunction). Doppler parameters are consistent with  elevated ventricular end-diastolic filling pressure.  Consultations:  none  Discharge Exam: Filed Vitals:   11/28/14 0500  BP: 169/67  Pulse: 65  Temp: 98.1 F (36.7 C)  Resp: 18    General: awake alert appears well Cardiovascular: RRR no m/g/r no LE edema Respiratory: normal effort BS clear bilaterally no wheeze Abdomen: obese non-distended +BS  Discharge Instructions    Current  Discharge Medication List    CONTINUE these medications which have NOT CHANGED   Details  acetaminophen (TYLENOL) 325 MG tablet Take 650 mg by mouth every 6 (six) hours as needed for mild pain or headache (takes as needed; not daily).    amLODipine (NORVASC) 5 MG tablet Take 7.5 mg by mouth daily.    Cholecalciferol (VITAMIN D) 2000 UNITS CAPS Take 1 capsule by mouth daily.    Choline Fenofibrate (TRILIPIX) 135 MG capsule Take 135 mg by mouth at bedtime.     ferrous sulfate 325 (65 FE) MG tablet Take 325 mg by mouth daily.     finasteride (PROSCAR) 5 MG tablet Take 5 mg by mouth daily.    furosemide (LASIX) 40 MG tablet Take 1 tablet (40 mg total) by mouth in the morning and 0.5 tablet (20 mg total) by mouth in the evening. NEED APPOINTMENT FOR FUTURE REFILL Qty: 45 tablet, Refills: 0    insulin lispro protamine-lispro (HUMALOG 75/25 MIX) (75-25) 100 UNIT/ML SUSP injection Inject 60 Units into the skin 2 (two) times daily with a meal.    losartan (COZAAR) 50 MG tablet Take 1 tablet by mouth daily.    meclizine (ANTIVERT) 25 MG tablet Take 25 mg by mouth 3 (three) times daily as needed for dizziness.    Misc Natural Products (OSTEO BI-FLEX ADV JOINT SHIELD PO) Take 2 capsules by mouth 2 (two) times daily. Has on hand but says he hasn't been taking regularly.    nebivolol (BYSTOLIC) 10 MG tablet Take 5 mg by mouth every morning.     ondansetron (ZOFRAN ODT) 4 MG disintegrating tablet Take 1 tablet (4 mg total) by mouth every 8 (eight) hours as needed for nausea or vomiting. Qty: 20 tablet, Refills: 0    pantoprazole (PROTONIX) 40 MG tablet Take 40 mg by mouth daily.    potassium chloride (KLOR-CON M10) 10 MEQ tablet Take 1 tablet (10 mEq total) by mouth daily. <please make appointment for refills> Qty: 90 tablet, Refills: 0    tamsulosin (FLOMAX) 0.4 MG CAPS capsule Take 0.4 mg by mouth daily.    terazosin (HYTRIN) 10 MG capsule Take 10 mg by mouth at bedtime.     HYDROcodone-acetaminophen (NORCO) 10-325 MG per tablet Take 1 tablet by mouth every 6 (six) hours as needed for severe pain (has not taken "in as long as I can remember").       Allergies  Allergen Reactions  . Latex Other (See Comments)    unknown  . Propoxyphene     Other reaction(s): Dizziness  . Sulfa Antibiotics Other (See Comments)    unknown  . Betadine [Povidone Iodine] Rash   Follow-up Information    Follow up with [facility].   Contact information:   [address] [place] [address] [phone]       Follow up with Purvis Kilts, MD.   Specialty:  Family Medicine   Contact information:   [address] [place] [address] [phone]        The results of significant diagnostics from this hospitalization (including  imaging, microbiology, ancillary and laboratory) are listed below for reference.    Significant Diagnostic Studies: Dg Chest 2 View  11/25/2014   CLINICAL DATA:  Acute onset of generalized weakness and chest pain. Subacute onset of nausea. Shortness of breath. Initial encounter.  EXAM: CHEST  2 VIEW  COMPARISON:  Chest radiograph from 09/18/2014  FINDINGS: The lungs are well-aerated. Mild vascular congestion is noted. Mild bibasilar atelectasis is seen. There is no evidence of pleural effusion or pneumothorax.  The heart is normal in size; the mediastinal contour is within normal limits. No acute osseous abnormalities are seen.  IMPRESSION: Mild vascular congestion noted.  Mild bibasilar atelectasis seen.   Electronically Signed   By: Garald Balding M.D.   On: 11/25/2014 19:17    Microbiology: Recent Results (from the past 240 hour(s))  Urine culture     Status: None (Preliminary result)   Collection Time: 11/25/14  9:19 PM  Result Value Ref Range Status   Specimen Description URINE, CLEAN CATCH  Final   Special Requests NONE  Final   Culture PENDING  Incomplete   Report Status PENDING  Incomplete      Labs: Basic Metabolic Panel:  Recent Labs Lab 11/25/14 1855 11/26/14 0618 11/28/14 0636  NA 139 142 142  K 3.7 3.6 3.8  CL 103 106 104  CO2 25 26 27   GLUCOSE 267* 177* 147*  BUN 47* 45* 44*  CREATININE 3.49* 3.19* 3.50*  CALCIUM 8.8* 8.6* 8.9   Liver Function Tests:  Recent Labs Lab 11/25/14 1855 11/26/14 0618  AST 19 18  ALT 16* 14*  ALKPHOS 48 40  BILITOT 0.4 0.4  PROT 6.9 6.3*  ALBUMIN 3.5 3.1*   No results for input(s): LIPASE, AMYLASE in the last 168 hours. No results for input(s): AMMONIA in the last 168 hours. CBC:  Recent Labs Lab 11/25/14 1855 11/26/14 0618 11/28/14 0636  WBC 9.9 9.5 9.0  NEUTROABS 6.6  --   --   HGB 11.2* 10.8* 11.3*  HCT 34.7* 34.1* 36.2*  MCV 87.8 87.7 88.7  PLT 402* 347 374   Cardiac Enzymes: No results for input(s): CKTOTAL, CKMB, CKMBINDEX, TROPONINI in the last 168 hours. BNP: BNP (last 3 results)  Recent Labs  11/25/14 1855  BNP 66.0    ProBNP (last 3 results)  Recent Labs  01/27/14 1923 02/12/14 1118  PROBNP 2210.0* 507.3*    CBG:  Recent Labs Lab 11/27/14 1129 11/27/14 1634 11/27/14 2050 11/28/14 0731 11/28/14 1146  GLUCAP 230* 173* 149* 139* 175*       Signed:  Kenlei Safi M  [facility] 11/28/2014, 11:53 AM

## 2014-11-28 NOTE — Progress Notes (Signed)
Pt IV removed, tolerated well.  Reviewed discharge instructions with pt and daughter, answered all questions.

## 2014-11-29 LAB — URINE CULTURE
CULTURE: NO GROWTH
Colony Count: NO GROWTH

## 2014-11-30 ENCOUNTER — Other Ambulatory Visit: Payer: Self-pay | Admitting: Cardiovascular Disease

## 2014-11-30 DIAGNOSIS — Z794 Long term (current) use of insulin: Secondary | ICD-10-CM | POA: Diagnosis not present

## 2014-11-30 DIAGNOSIS — E669 Obesity, unspecified: Secondary | ICD-10-CM | POA: Diagnosis not present

## 2014-11-30 DIAGNOSIS — E785 Hyperlipidemia, unspecified: Secondary | ICD-10-CM | POA: Diagnosis not present

## 2014-11-30 DIAGNOSIS — I5032 Chronic diastolic (congestive) heart failure: Secondary | ICD-10-CM | POA: Diagnosis not present

## 2014-11-30 DIAGNOSIS — E119 Type 2 diabetes mellitus without complications: Secondary | ICD-10-CM | POA: Diagnosis not present

## 2014-11-30 DIAGNOSIS — I129 Hypertensive chronic kidney disease with stage 1 through stage 4 chronic kidney disease, or unspecified chronic kidney disease: Secondary | ICD-10-CM | POA: Diagnosis not present

## 2014-11-30 DIAGNOSIS — R627 Adult failure to thrive: Secondary | ICD-10-CM | POA: Diagnosis not present

## 2014-11-30 DIAGNOSIS — Z8673 Personal history of transient ischemic attack (TIA), and cerebral infarction without residual deficits: Secondary | ICD-10-CM | POA: Diagnosis not present

## 2014-11-30 DIAGNOSIS — N184 Chronic kidney disease, stage 4 (severe): Secondary | ICD-10-CM | POA: Diagnosis not present

## 2014-12-03 DIAGNOSIS — I5032 Chronic diastolic (congestive) heart failure: Secondary | ICD-10-CM | POA: Diagnosis not present

## 2014-12-03 DIAGNOSIS — I129 Hypertensive chronic kidney disease with stage 1 through stage 4 chronic kidney disease, or unspecified chronic kidney disease: Secondary | ICD-10-CM | POA: Diagnosis not present

## 2014-12-03 DIAGNOSIS — E785 Hyperlipidemia, unspecified: Secondary | ICD-10-CM | POA: Diagnosis not present

## 2014-12-03 DIAGNOSIS — R627 Adult failure to thrive: Secondary | ICD-10-CM | POA: Diagnosis not present

## 2014-12-03 DIAGNOSIS — E119 Type 2 diabetes mellitus without complications: Secondary | ICD-10-CM | POA: Diagnosis not present

## 2014-12-03 DIAGNOSIS — N184 Chronic kidney disease, stage 4 (severe): Secondary | ICD-10-CM | POA: Diagnosis not present

## 2014-12-04 DIAGNOSIS — E785 Hyperlipidemia, unspecified: Secondary | ICD-10-CM | POA: Diagnosis not present

## 2014-12-04 DIAGNOSIS — E119 Type 2 diabetes mellitus without complications: Secondary | ICD-10-CM | POA: Diagnosis not present

## 2014-12-04 DIAGNOSIS — R627 Adult failure to thrive: Secondary | ICD-10-CM | POA: Diagnosis not present

## 2014-12-04 DIAGNOSIS — N184 Chronic kidney disease, stage 4 (severe): Secondary | ICD-10-CM | POA: Diagnosis not present

## 2014-12-04 DIAGNOSIS — I5032 Chronic diastolic (congestive) heart failure: Secondary | ICD-10-CM | POA: Diagnosis not present

## 2014-12-04 DIAGNOSIS — I129 Hypertensive chronic kidney disease with stage 1 through stage 4 chronic kidney disease, or unspecified chronic kidney disease: Secondary | ICD-10-CM | POA: Diagnosis not present

## 2014-12-05 DIAGNOSIS — E119 Type 2 diabetes mellitus without complications: Secondary | ICD-10-CM | POA: Diagnosis not present

## 2014-12-05 DIAGNOSIS — I129 Hypertensive chronic kidney disease with stage 1 through stage 4 chronic kidney disease, or unspecified chronic kidney disease: Secondary | ICD-10-CM | POA: Diagnosis not present

## 2014-12-05 DIAGNOSIS — N184 Chronic kidney disease, stage 4 (severe): Secondary | ICD-10-CM | POA: Diagnosis not present

## 2014-12-05 DIAGNOSIS — I5032 Chronic diastolic (congestive) heart failure: Secondary | ICD-10-CM | POA: Diagnosis not present

## 2014-12-05 DIAGNOSIS — R627 Adult failure to thrive: Secondary | ICD-10-CM | POA: Diagnosis not present

## 2014-12-05 DIAGNOSIS — E785 Hyperlipidemia, unspecified: Secondary | ICD-10-CM | POA: Diagnosis not present

## 2014-12-06 DIAGNOSIS — E785 Hyperlipidemia, unspecified: Secondary | ICD-10-CM | POA: Diagnosis not present

## 2014-12-06 DIAGNOSIS — I5032 Chronic diastolic (congestive) heart failure: Secondary | ICD-10-CM | POA: Diagnosis not present

## 2014-12-06 DIAGNOSIS — E119 Type 2 diabetes mellitus without complications: Secondary | ICD-10-CM | POA: Diagnosis not present

## 2014-12-06 DIAGNOSIS — I129 Hypertensive chronic kidney disease with stage 1 through stage 4 chronic kidney disease, or unspecified chronic kidney disease: Secondary | ICD-10-CM | POA: Diagnosis not present

## 2014-12-06 DIAGNOSIS — N184 Chronic kidney disease, stage 4 (severe): Secondary | ICD-10-CM | POA: Diagnosis not present

## 2014-12-06 DIAGNOSIS — R627 Adult failure to thrive: Secondary | ICD-10-CM | POA: Diagnosis not present

## 2014-12-07 DIAGNOSIS — R627 Adult failure to thrive: Secondary | ICD-10-CM | POA: Diagnosis not present

## 2014-12-07 DIAGNOSIS — I5032 Chronic diastolic (congestive) heart failure: Secondary | ICD-10-CM | POA: Diagnosis not present

## 2014-12-07 DIAGNOSIS — E785 Hyperlipidemia, unspecified: Secondary | ICD-10-CM | POA: Diagnosis not present

## 2014-12-07 DIAGNOSIS — I129 Hypertensive chronic kidney disease with stage 1 through stage 4 chronic kidney disease, or unspecified chronic kidney disease: Secondary | ICD-10-CM | POA: Diagnosis not present

## 2014-12-07 DIAGNOSIS — E119 Type 2 diabetes mellitus without complications: Secondary | ICD-10-CM | POA: Diagnosis not present

## 2014-12-07 DIAGNOSIS — N184 Chronic kidney disease, stage 4 (severe): Secondary | ICD-10-CM | POA: Diagnosis not present

## 2014-12-10 DIAGNOSIS — E119 Type 2 diabetes mellitus without complications: Secondary | ICD-10-CM | POA: Diagnosis not present

## 2014-12-10 DIAGNOSIS — N184 Chronic kidney disease, stage 4 (severe): Secondary | ICD-10-CM | POA: Diagnosis not present

## 2014-12-10 DIAGNOSIS — E785 Hyperlipidemia, unspecified: Secondary | ICD-10-CM | POA: Diagnosis not present

## 2014-12-10 DIAGNOSIS — R627 Adult failure to thrive: Secondary | ICD-10-CM | POA: Diagnosis not present

## 2014-12-10 DIAGNOSIS — I5032 Chronic diastolic (congestive) heart failure: Secondary | ICD-10-CM | POA: Diagnosis not present

## 2014-12-10 DIAGNOSIS — I129 Hypertensive chronic kidney disease with stage 1 through stage 4 chronic kidney disease, or unspecified chronic kidney disease: Secondary | ICD-10-CM | POA: Diagnosis not present

## 2014-12-12 DIAGNOSIS — E785 Hyperlipidemia, unspecified: Secondary | ICD-10-CM | POA: Diagnosis not present

## 2014-12-12 DIAGNOSIS — I509 Heart failure, unspecified: Secondary | ICD-10-CM | POA: Diagnosis not present

## 2014-12-12 DIAGNOSIS — Z79899 Other long term (current) drug therapy: Secondary | ICD-10-CM | POA: Diagnosis not present

## 2014-12-12 DIAGNOSIS — I10 Essential (primary) hypertension: Secondary | ICD-10-CM | POA: Diagnosis not present

## 2014-12-12 DIAGNOSIS — E559 Vitamin D deficiency, unspecified: Secondary | ICD-10-CM | POA: Diagnosis not present

## 2014-12-12 DIAGNOSIS — I5032 Chronic diastolic (congestive) heart failure: Secondary | ICD-10-CM | POA: Diagnosis not present

## 2014-12-12 DIAGNOSIS — N184 Chronic kidney disease, stage 4 (severe): Secondary | ICD-10-CM | POA: Diagnosis not present

## 2014-12-12 DIAGNOSIS — Z23 Encounter for immunization: Secondary | ICD-10-CM | POA: Diagnosis not present

## 2014-12-12 DIAGNOSIS — I129 Hypertensive chronic kidney disease with stage 1 through stage 4 chronic kidney disease, or unspecified chronic kidney disease: Secondary | ICD-10-CM | POA: Diagnosis not present

## 2014-12-12 DIAGNOSIS — R627 Adult failure to thrive: Secondary | ICD-10-CM | POA: Diagnosis not present

## 2014-12-12 DIAGNOSIS — N183 Chronic kidney disease, stage 3 (moderate): Secondary | ICD-10-CM | POA: Diagnosis not present

## 2014-12-12 DIAGNOSIS — D649 Anemia, unspecified: Secondary | ICD-10-CM | POA: Diagnosis not present

## 2014-12-12 DIAGNOSIS — E119 Type 2 diabetes mellitus without complications: Secondary | ICD-10-CM | POA: Diagnosis not present

## 2014-12-12 DIAGNOSIS — Z6838 Body mass index (BMI) 38.0-38.9, adult: Secondary | ICD-10-CM | POA: Diagnosis not present

## 2014-12-12 DIAGNOSIS — R809 Proteinuria, unspecified: Secondary | ICD-10-CM | POA: Diagnosis not present

## 2014-12-12 DIAGNOSIS — M1991 Primary osteoarthritis, unspecified site: Secondary | ICD-10-CM | POA: Diagnosis not present

## 2014-12-13 DIAGNOSIS — I5032 Chronic diastolic (congestive) heart failure: Secondary | ICD-10-CM | POA: Diagnosis not present

## 2014-12-13 DIAGNOSIS — I129 Hypertensive chronic kidney disease with stage 1 through stage 4 chronic kidney disease, or unspecified chronic kidney disease: Secondary | ICD-10-CM | POA: Diagnosis not present

## 2014-12-13 DIAGNOSIS — E119 Type 2 diabetes mellitus without complications: Secondary | ICD-10-CM | POA: Diagnosis not present

## 2014-12-13 DIAGNOSIS — N184 Chronic kidney disease, stage 4 (severe): Secondary | ICD-10-CM | POA: Diagnosis not present

## 2014-12-13 DIAGNOSIS — R627 Adult failure to thrive: Secondary | ICD-10-CM | POA: Diagnosis not present

## 2014-12-13 DIAGNOSIS — E785 Hyperlipidemia, unspecified: Secondary | ICD-10-CM | POA: Diagnosis not present

## 2014-12-14 ENCOUNTER — Other Ambulatory Visit: Payer: Self-pay | Admitting: *Deleted

## 2014-12-14 DIAGNOSIS — I129 Hypertensive chronic kidney disease with stage 1 through stage 4 chronic kidney disease, or unspecified chronic kidney disease: Secondary | ICD-10-CM | POA: Diagnosis not present

## 2014-12-14 DIAGNOSIS — E119 Type 2 diabetes mellitus without complications: Secondary | ICD-10-CM | POA: Diagnosis not present

## 2014-12-14 DIAGNOSIS — N184 Chronic kidney disease, stage 4 (severe): Secondary | ICD-10-CM | POA: Diagnosis not present

## 2014-12-14 DIAGNOSIS — R627 Adult failure to thrive: Secondary | ICD-10-CM | POA: Diagnosis not present

## 2014-12-14 DIAGNOSIS — E785 Hyperlipidemia, unspecified: Secondary | ICD-10-CM | POA: Diagnosis not present

## 2014-12-14 DIAGNOSIS — I5032 Chronic diastolic (congestive) heart failure: Secondary | ICD-10-CM | POA: Diagnosis not present

## 2014-12-14 NOTE — Patient Outreach (Signed)
[place] [facility]) Care Management  12/14/2014  [name] 08-18-1930 [id number]   Unable to reach [name] by phone today to reschedule missed appointment. [name] was a no show for his last scheduled home visit and I have been unable to reach him by phone. He has not returned messages. I will try to reach him via his daughter next week.   [place] Management  [phone]

## 2014-12-18 ENCOUNTER — Other Ambulatory Visit: Payer: Self-pay | Admitting: Cardiovascular Disease

## 2014-12-18 DIAGNOSIS — I129 Hypertensive chronic kidney disease with stage 1 through stage 4 chronic kidney disease, or unspecified chronic kidney disease: Secondary | ICD-10-CM | POA: Diagnosis not present

## 2014-12-18 DIAGNOSIS — E785 Hyperlipidemia, unspecified: Secondary | ICD-10-CM | POA: Diagnosis not present

## 2014-12-18 DIAGNOSIS — E119 Type 2 diabetes mellitus without complications: Secondary | ICD-10-CM | POA: Diagnosis not present

## 2014-12-18 DIAGNOSIS — R627 Adult failure to thrive: Secondary | ICD-10-CM | POA: Diagnosis not present

## 2014-12-18 DIAGNOSIS — N184 Chronic kidney disease, stage 4 (severe): Secondary | ICD-10-CM | POA: Diagnosis not present

## 2014-12-18 DIAGNOSIS — I5032 Chronic diastolic (congestive) heart failure: Secondary | ICD-10-CM | POA: Diagnosis not present

## 2014-12-21 DIAGNOSIS — R627 Adult failure to thrive: Secondary | ICD-10-CM | POA: Diagnosis not present

## 2014-12-21 DIAGNOSIS — I5032 Chronic diastolic (congestive) heart failure: Secondary | ICD-10-CM | POA: Diagnosis not present

## 2014-12-21 DIAGNOSIS — E785 Hyperlipidemia, unspecified: Secondary | ICD-10-CM | POA: Diagnosis not present

## 2014-12-21 DIAGNOSIS — N184 Chronic kidney disease, stage 4 (severe): Secondary | ICD-10-CM | POA: Diagnosis not present

## 2014-12-21 DIAGNOSIS — I129 Hypertensive chronic kidney disease with stage 1 through stage 4 chronic kidney disease, or unspecified chronic kidney disease: Secondary | ICD-10-CM | POA: Diagnosis not present

## 2014-12-21 DIAGNOSIS — E119 Type 2 diabetes mellitus without complications: Secondary | ICD-10-CM | POA: Diagnosis not present

## 2014-12-24 DIAGNOSIS — R627 Adult failure to thrive: Secondary | ICD-10-CM | POA: Diagnosis not present

## 2014-12-24 DIAGNOSIS — N184 Chronic kidney disease, stage 4 (severe): Secondary | ICD-10-CM | POA: Diagnosis not present

## 2014-12-24 DIAGNOSIS — E785 Hyperlipidemia, unspecified: Secondary | ICD-10-CM | POA: Diagnosis not present

## 2014-12-24 DIAGNOSIS — I129 Hypertensive chronic kidney disease with stage 1 through stage 4 chronic kidney disease, or unspecified chronic kidney disease: Secondary | ICD-10-CM | POA: Diagnosis not present

## 2014-12-24 DIAGNOSIS — E119 Type 2 diabetes mellitus without complications: Secondary | ICD-10-CM | POA: Diagnosis not present

## 2014-12-24 DIAGNOSIS — I5032 Chronic diastolic (congestive) heart failure: Secondary | ICD-10-CM | POA: Diagnosis not present

## 2014-12-25 ENCOUNTER — Other Ambulatory Visit: Payer: Self-pay | Admitting: *Deleted

## 2014-12-25 NOTE — Patient Outreach (Signed)
[facility]) Care Management  12/25/2014  Rendell Thivierge Yarbrough [birth year] [id number]   I have left several HIPPA compliant voice messages requesting return call with Mr. Mccombie and have not heard from him over a several week period. I sent a note to him today offering to continue [place] Management services is he wishes to continue. If I do no hear from Mr. Keir, I will close his case in 10 days. [facility] is happy to engage Mr. Headen and provide needed services as appropriate at any time in the future.    [place] Management  [phone]

## 2014-12-26 ENCOUNTER — Encounter (HOSPITAL_COMMUNITY): Payer: Self-pay | Admitting: Emergency Medicine

## 2014-12-26 ENCOUNTER — Emergency Department (HOSPITAL_COMMUNITY): Payer: Medicare Other

## 2014-12-26 ENCOUNTER — Inpatient Hospital Stay (HOSPITAL_COMMUNITY)
Admission: EM | Admit: 2014-12-26 | Discharge: 2014-12-29 | DRG: 193 | Disposition: A | Discharge status: Home / Self Care | Payer: Medicare Other | Attending: Internal Medicine | Admitting: Internal Medicine

## 2014-12-26 DIAGNOSIS — I120 Hypertensive chronic kidney disease with stage 5 chronic kidney disease or end stage renal disease: Secondary | ICD-10-CM | POA: Diagnosis present

## 2014-12-26 DIAGNOSIS — E1165 Type 2 diabetes mellitus with hyperglycemia: Secondary | ICD-10-CM | POA: Diagnosis present

## 2014-12-26 DIAGNOSIS — K219 Gastro-esophageal reflux disease without esophagitis: Secondary | ICD-10-CM | POA: Diagnosis present

## 2014-12-26 DIAGNOSIS — I10 Essential (primary) hypertension: Secondary | ICD-10-CM | POA: Diagnosis not present

## 2014-12-26 DIAGNOSIS — Y95 Nosocomial condition: Secondary | ICD-10-CM | POA: Diagnosis present

## 2014-12-26 DIAGNOSIS — R809 Proteinuria, unspecified: Secondary | ICD-10-CM | POA: Diagnosis not present

## 2014-12-26 DIAGNOSIS — J9621 Acute and chronic respiratory failure with hypoxia: Secondary | ICD-10-CM | POA: Diagnosis not present

## 2014-12-26 DIAGNOSIS — N400 Enlarged prostate without lower urinary tract symptoms: Secondary | ICD-10-CM | POA: Diagnosis not present

## 2014-12-26 DIAGNOSIS — E785 Hyperlipidemia, unspecified: Secondary | ICD-10-CM | POA: Diagnosis present

## 2014-12-26 DIAGNOSIS — R05 Cough: Secondary | ICD-10-CM | POA: Diagnosis not present

## 2014-12-26 DIAGNOSIS — Z8673 Personal history of transient ischemic attack (TIA), and cerebral infarction without residual deficits: Secondary | ICD-10-CM | POA: Diagnosis not present

## 2014-12-26 DIAGNOSIS — Z794 Long term (current) use of insulin: Secondary | ICD-10-CM

## 2014-12-26 DIAGNOSIS — T380X5A Adverse effect of glucocorticoids and synthetic analogues, initial encounter: Secondary | ICD-10-CM | POA: Diagnosis present

## 2014-12-26 DIAGNOSIS — J189 Pneumonia, unspecified organism: Principal | ICD-10-CM | POA: Diagnosis present

## 2014-12-26 DIAGNOSIS — J9601 Acute respiratory failure with hypoxia: Secondary | ICD-10-CM | POA: Diagnosis present

## 2014-12-26 DIAGNOSIS — N185 Chronic kidney disease, stage 5: Secondary | ICD-10-CM | POA: Diagnosis present

## 2014-12-26 DIAGNOSIS — I5032 Chronic diastolic (congestive) heart failure: Secondary | ICD-10-CM

## 2014-12-26 DIAGNOSIS — N184 Chronic kidney disease, stage 4 (severe): Secondary | ICD-10-CM | POA: Diagnosis not present

## 2014-12-26 DIAGNOSIS — R0602 Shortness of breath: Secondary | ICD-10-CM | POA: Diagnosis not present

## 2014-12-26 DIAGNOSIS — D638 Anemia in other chronic diseases classified elsewhere: Secondary | ICD-10-CM | POA: Diagnosis not present

## 2014-12-26 DIAGNOSIS — J441 Chronic obstructive pulmonary disease with (acute) exacerbation: Secondary | ICD-10-CM | POA: Diagnosis present

## 2014-12-26 DIAGNOSIS — E119 Type 2 diabetes mellitus without complications: Secondary | ICD-10-CM

## 2014-12-26 HISTORY — DX: Heart failure, unspecified: I50.9

## 2014-12-26 LAB — CBC WITH DIFFERENTIAL/PLATELET
BASOS ABS: 0 10*3/uL (ref 0.0–0.1)
Basophils Relative: 0 % (ref 0–1)
Eosinophils Absolute: 0.5 10*3/uL (ref 0.0–0.7)
Eosinophils Relative: 4 % (ref 0–5)
HEMATOCRIT: 32.7 % — AB (ref 39.0–52.0)
HEMOGLOBIN: 10.3 g/dL — AB (ref 13.0–17.0)
Lymphocytes Relative: 18 % (ref 12–46)
Lymphs Abs: 2.1 10*3/uL (ref 0.7–4.0)
MCH: 28.1 pg (ref 26.0–34.0)
MCHC: 31.5 g/dL (ref 30.0–36.0)
MCV: 89.1 fL (ref 78.0–100.0)
Monocytes Absolute: 0.9 10*3/uL (ref 0.1–1.0)
Monocytes Relative: 8 % (ref 3–12)
NEUTROS PCT: 70 % (ref 43–77)
Neutro Abs: 8.1 10*3/uL — ABNORMAL HIGH (ref 1.7–7.7)
Platelets: 334 10*3/uL (ref 150–400)
RBC: 3.67 MIL/uL — AB (ref 4.22–5.81)
RDW: 15.4 % (ref 11.5–15.5)
WBC: 11.6 10*3/uL — ABNORMAL HIGH (ref 4.0–10.5)

## 2014-12-26 LAB — BASIC METABOLIC PANEL
Anion gap: 11 (ref 5–15)
BUN: 69 mg/dL — AB (ref 6–20)
CO2: 21 mmol/L — ABNORMAL LOW (ref 22–32)
CREATININE: 3.9 mg/dL — AB (ref 0.61–1.24)
Calcium: 8.5 mg/dL — ABNORMAL LOW (ref 8.9–10.3)
Chloride: 107 mmol/L (ref 101–111)
GFR, EST AFRICAN AMERICAN: 15 mL/min — AB (ref >60)
GFR, EST NON AFRICAN AMERICAN: 13 mL/min — AB (ref >60)
Glucose, Bld: 127 mg/dL — ABNORMAL HIGH (ref 65–99)
POTASSIUM: 3.8 mmol/L (ref 3.5–5.1)
SODIUM: 139 mmol/L (ref 135–145)

## 2014-12-26 LAB — CBG MONITORING, ED: GLUCOSE-CAPILLARY: 160 mg/dL — AB (ref 65–99)

## 2014-12-26 LAB — BRAIN NATRIURETIC PEPTIDE: B Natriuretic Peptide: 38 pg/mL (ref 0.0–100.0)

## 2014-12-26 LAB — TROPONIN I

## 2014-12-26 MED ORDER — FUROSEMIDE 40 MG PO TABS
40.0000 mg | ORAL_TABLET | Freq: Two times a day (BID) | ORAL | Status: DC
  Administered 2014-12-27 – 2014-12-28 (×3): 40 mg via ORAL
  Filled 2014-12-26 (×4): qty 1

## 2014-12-26 MED ORDER — ONDANSETRON HCL 4 MG/2ML IJ SOLN: 4.0000 mg | Freq: Four times a day (QID) | INTRAMUSCULAR | Status: DC | PRN

## 2014-12-26 MED ORDER — IPRATROPIUM-ALBUTEROL 0.5-2.5 (3) MG/3ML IN SOLN
3.0000 mL | RESPIRATORY_TRACT | Status: DC
  Administered 2014-12-26 – 2014-12-28 (×12): 3 mL via RESPIRATORY_TRACT
  Filled 2014-12-26 (×12): qty 3

## 2014-12-26 MED ORDER — VANCOMYCIN HCL IN DEXTROSE 1-5 GM/200ML-% IV SOLN
1000.0000 mg | Freq: Once | INTRAVENOUS | Status: AC
  Administered 2014-12-26: 1000 mg via INTRAVENOUS
  Filled 2014-12-26: qty 200

## 2014-12-26 MED ORDER — FINASTERIDE 5 MG PO TABS
5.0000 mg | ORAL_TABLET | Freq: Every day | ORAL | Status: DC
  Administered 2014-12-27 – 2014-12-29 (×3): 5 mg via ORAL
  Filled 2014-12-26 (×5): qty 1

## 2014-12-26 MED ORDER — HEPARIN SODIUM (PORCINE) 5000 UNIT/ML IJ SOLN
5000.0000 [IU] | Freq: Three times a day (TID) | INTRAMUSCULAR | Status: DC
  Administered 2014-12-26 – 2014-12-29 (×8): 5000 [IU] via SUBCUTANEOUS
  Filled 2014-12-26 (×8): qty 1

## 2014-12-26 MED ORDER — ALBUTEROL SULFATE (2.5 MG/3ML) 0.083% IN NEBU
2.5000 mg | INHALATION_SOLUTION | Freq: Once | RESPIRATORY_TRACT | Status: AC
  Administered 2014-12-26: 2.5 mg via RESPIRATORY_TRACT

## 2014-12-26 MED ORDER — ONDANSETRON HCL 4 MG PO TABS: 4.0000 mg | ORAL_TABLET | Freq: Four times a day (QID) | ORAL | Status: DC | PRN

## 2014-12-26 MED ORDER — AMLODIPINE BESYLATE 5 MG PO TABS
7.5000 mg | ORAL_TABLET | Freq: Every day | ORAL | Status: DC
  Administered 2014-12-27 – 2014-12-29 (×3): 7.5 mg via ORAL
  Filled 2014-12-26 (×3): qty 2

## 2014-12-26 MED ORDER — IPRATROPIUM BROMIDE 0.02 % IN SOLN
0.5000 mg | Freq: Once | RESPIRATORY_TRACT | Status: DC
Start: 2014-12-26 — End: 2014-12-26

## 2014-12-26 MED ORDER — IPRATROPIUM-ALBUTEROL 0.5-2.5 (3) MG/3ML IN SOLN: 3.0000 mL | Freq: Once | RESPIRATORY_TRACT | Status: DC

## 2014-12-26 MED ORDER — PIPERACILLIN-TAZOBACTAM IN DEX 2-0.25 GM/50ML IV SOLN
2.2500 g | Freq: Three times a day (TID) | INTRAVENOUS | Status: DC
  Administered 2014-12-27: 2.25 g via INTRAVENOUS
  Filled 2014-12-26 (×5): qty 50

## 2014-12-26 MED ORDER — VANCOMYCIN HCL 10 G IV SOLR
1500.0000 mg | INTRAVENOUS | Status: DC
Start: 2014-12-28 — End: 2014-12-28
  Filled 2014-12-26 (×2): qty 1500

## 2014-12-26 MED ORDER — INSULIN ASPART 100 UNIT/ML ~~LOC~~ SOLN
0.0000 [IU] | Freq: Every day | SUBCUTANEOUS | Status: DC
  Administered 2014-12-27: 4 [IU] via SUBCUTANEOUS
  Administered 2014-12-28: 2 [IU] via SUBCUTANEOUS

## 2014-12-26 MED ORDER — IPRATROPIUM-ALBUTEROL 0.5-2.5 (3) MG/3ML IN SOLN
RESPIRATORY_TRACT | Status: AC
Start: 2014-12-26 — End: 2014-12-26
  Administered 2014-12-26: 21:00:00
  Filled 2014-12-26: qty 3

## 2014-12-26 MED ORDER — POTASSIUM CHLORIDE CRYS ER 10 MEQ PO TBCR
10.0000 meq | EXTENDED_RELEASE_TABLET | Freq: Every day | ORAL | Status: DC
  Administered 2014-12-27 – 2014-12-29 (×3): 10 meq via ORAL
  Filled 2014-12-26 (×3): qty 1

## 2014-12-26 MED ORDER — SODIUM CHLORIDE 0.9 % IJ SOLN
3.0000 mL | Freq: Two times a day (BID) | INTRAMUSCULAR | Status: DC
  Administered 2014-12-27 – 2014-12-29 (×4): 3 mL via INTRAVENOUS

## 2014-12-26 MED ORDER — PIPERACILLIN-TAZOBACTAM 3.375 G IVPB 30 MIN
3.3750 g | Freq: Once | INTRAVENOUS | Status: AC
  Administered 2014-12-26: 3.375 g via INTRAVENOUS
  Filled 2014-12-26: qty 50

## 2014-12-26 MED ORDER — ALBUTEROL SULFATE (2.5 MG/3ML) 0.083% IN NEBU
5.0000 mg | INHALATION_SOLUTION | Freq: Once | RESPIRATORY_TRACT | Status: DC
  Filled 2014-12-26: qty 6

## 2014-12-26 MED ORDER — TAMSULOSIN HCL 0.4 MG PO CAPS
0.4000 mg | ORAL_CAPSULE | Freq: Every day | ORAL | Status: DC
  Administered 2014-12-27 – 2014-12-29 (×3): 0.4 mg via ORAL
  Filled 2014-12-26 (×3): qty 1

## 2014-12-26 MED ORDER — HYDROCODONE-ACETAMINOPHEN 10-325 MG PO TABS: 1.0000 | ORAL_TABLET | Freq: Four times a day (QID) | ORAL | Status: DC | PRN

## 2014-12-26 MED ORDER — LOSARTAN POTASSIUM 25 MG PO TABS
12.5000 mg | ORAL_TABLET | Freq: Every day | ORAL | Status: DC
Start: 2014-12-27 — End: 2014-12-28
  Administered 2014-12-27 – 2014-12-28 (×2): 12.5 mg via ORAL
  Filled 2014-12-26 (×5): qty 0.5

## 2014-12-26 MED ORDER — INSULIN ASPART 100 UNIT/ML ~~LOC~~ SOLN
0.0000 [IU] | Freq: Three times a day (TID) | SUBCUTANEOUS | Status: DC
  Administered 2014-12-27: 15 [IU] via SUBCUTANEOUS
  Administered 2014-12-27: 20 [IU] via SUBCUTANEOUS
  Administered 2014-12-27: 7 [IU] via SUBCUTANEOUS
  Administered 2014-12-28: 11 [IU] via SUBCUTANEOUS
  Administered 2014-12-28: 4 [IU] via SUBCUTANEOUS
  Administered 2014-12-28: 15 [IU] via SUBCUTANEOUS
  Administered 2014-12-29: 4 [IU] via SUBCUTANEOUS

## 2014-12-26 MED ORDER — FENOFIBRATE 160 MG PO TABS
160.0000 mg | ORAL_TABLET | Freq: Every day | ORAL | Status: DC
  Administered 2014-12-27 – 2014-12-29 (×3): 160 mg via ORAL
  Filled 2014-12-26 (×3): qty 1

## 2014-12-26 MED ORDER — METHYLPREDNISOLONE SODIUM SUCC 125 MG IJ SOLR
125.0000 mg | Freq: Once | INTRAMUSCULAR | Status: AC
  Administered 2014-12-26: 125 mg via INTRAVENOUS
  Filled 2014-12-26: qty 2

## 2014-12-26 MED ORDER — PANTOPRAZOLE SODIUM 40 MG PO TBEC
40.0000 mg | DELAYED_RELEASE_TABLET | Freq: Every day | ORAL | Status: DC
  Administered 2014-12-27 – 2014-12-29 (×3): 40 mg via ORAL
  Filled 2014-12-26 (×3): qty 1

## 2014-12-26 MED ORDER — NEBIVOLOL HCL 10 MG PO TABS
10.0000 mg | ORAL_TABLET | ORAL | Status: DC
  Administered 2014-12-27 – 2014-12-29 (×3): 10 mg via ORAL
  Filled 2014-12-26 (×3): qty 1

## 2014-12-26 NOTE — Progress Notes (Signed)
ANTIBIOTIC CONSULT NOTE - FOLLOW UP  Pharmacy Consult for Vancomycin and Zosyn  Indication: pneumonia HCAP  Allergies  Allergen Reactions  . Asa [Aspirin] Other (See Comments)    GI bleeding  . Latex Other (See Comments)    unknown  . Propoxyphene     Other reaction(s): Dizziness  . Sulfa Antibiotics Other (See Comments)    unknown  . Betadine [Povidone Iodine] Rash    Patient Measurements: Height: 5\' 7"  (170.2 cm) Weight: 238 lb 15.7 oz (108.4 kg) IBW/kg (Calculated) : 66.1  Vital Signs: Temp: 98.1 F (36.7 C) (06/29 2106) Temp Source: Oral (06/29 2106) BP: 143/55 mmHg (06/29 2106) Pulse Rate: 76 (06/29 2106) Intake/Output from previous day:   Intake/Output from this shift:    Labs:  Recent Labs  12/26/14 2011  WBC 11.6*  HGB 10.3*  PLT 334  CREATININE 3.90*   Estimated Creatinine Clearance: 17.4 mL/min (by C-G formula based on Cr of 3.9). No results for input(s): VANCOTROUGH, VANCOPEAK, VANCORANDOM, GENTTROUGH, GENTPEAK, GENTRANDOM, TOBRATROUGH, TOBRAPEAK, TOBRARND, AMIKACINPEAK, AMIKACINTROU, AMIKACIN in the last 72 hours.   Microbiology: No results found for this or any previous visit (from the past 720 hour(s)).  Anti-infectives    Start     Dose/Rate Route Frequency Ordered Stop   12/26/14 2030  piperacillin-tazobactam (ZOSYN) IVPB 3.375 g     3.375 g 100 mL/hr over 30 Minutes Intravenous  Once 12/26/14 2019 12/26/14 2124   12/26/14 2030  vancomycin (VANCOCIN) IVPB 1000 mg/200 mL premix     1,000 mg 200 mL/hr over 60 Minutes Intravenous  Once 12/26/14 2019 12/26/14 2221     Assessment: Okay for Protocol, initial doses ordered in ED. CKD IV-V: Creatinine 3.9. Baseline 3.45. Obesity/Normalized CrCl Vancomycin dosing protocol will be initiated with an estimated normalized CrCl = 14.9 ml/min.   Goal of Therapy:  Vancomycin trough level 15-20 mcg/ml  Plan:  Give a total of Vancomycin 2gm this evening and then 1500mg  IV every 48 hours. Measure  antibiotic drug levels at steady state Follow up culture results Zosyn 2.25gm IV every 8 hours. Follow-up micro data, labs, vitals.   Biagio Quint R 12/26/2014,10:25 PM

## 2014-12-26 NOTE — H&P (Signed)
[facility] History and Physical  John [name] AUQ:[id number] DOB: [birth year] DOA: 12/26/2014  Referring physician: Dr Alvino Chapel - [facility] PCP: Purvis Kilts, MD   Chief Complaint: cough  HPI: John [name] is a 81 y.o. male  Cough. Started 7-10 days ago. Intermittent productive. Getting worse. ENT with some improvement per her daughter. Subjective fever. Associated with some shortness of breath. Decreased energy level over the last several days. Patient states that he was in the hospital a few days prior to onset of symptoms but also received his pneumonia shot the day of onset of symptoms.  Review of Systems:  Constitutional:  No weight loss, night sweats,  HEENT:  No headaches, Difficulty swallowing,Tooth/dental problems,Sore throat,  No sneezing, itching, ear ache, nasal congestion, post nasal drip,  Cardio-vascular:  No chest pain, Orthopnea, PND, swelling in lower extremities, anasarca,  palpitations  GI:  No heartburn, indigestion, abdominal pain, nausea, vomiting,loss of appetite  Resp: Per HPI Skin:  no rash or lesions.  GU:  no dysuria, change in color of urine, no urgency or frequency. No flank pain.  Musculoskeletal:   No joint pain or swelling. No decreased range of motion. No back pain.  Psych:  No change in mood or affect. No depression or anxiety. No memory loss.   Past Medical History  Diagnosis Date  . GI bleed     2006, TCS/TI showed small polyp. EGD showed erosive antral gastritis with two polyp, one oozing and tx. HP neg. SB capsule shoed few jejunal ulcers with bleeding (naproxen and ASA)  . GI bleed     02/2010, EGD->pancreatitc rest, fundal gland polyps,  no bleeding. TCS->blood-tinged colonic effluent throughout the colon without bleeding lesion found, nl TI. SB capsule->SB erosions, nonbleeding, incomplete study. Patient on naproxyn.   . DM (diabetes mellitus)   . Pneumonia   . Hyperlipidemia   . HTN (hypertension)   . Stroke   . GERD  (gastroesophageal reflux disease)   . Poor vision     left eye  . Sleep apnea   . Obesity   . Ischemic colitis, enteritis, or enterocolitis     flex sig 01/2010  . Gastric AVM 10/24/10    GI bleed EGD w/ bicap and hemoclip placement, 2 AVMs, presented w/bright red rectal bleeding  . GI bleed 10/27/10    ileocolonoscopy/GIVENS capsule study by Dr Rourk->bloody colonic effluent throughout colon but no lesion identified, TA @ hepatic flexure, fresh blood in dital SB on capsule. Nuc med RBC study negative  . Tubular adenoma of colon 10/27/10    on colonoscopy Dr Gala Romney  . Seborrheic keratoses      back and chest  . Bundle branch block, right   . Hypoxia     requiring oxygen while in hospital  . Nephrolithiasis   . Chronic renal insufficiency   . Kidney disease   . CHF (congestive heart failure)    Past Surgical History  Procedure Laterality Date  . Back surgery    . Transurethral resection of prostate    . Cataract extraction      bilateral  . Appendectomy    . Cholecystectomy    . Elbow surgery    . Esophagogastroduodenoscopy  10/23/10    Dr. Tora Kindred arteriovenous malformations,GI bleed- capsule  . Colonoscopy  10/27/10    Dr. Chauncy Lean adenoma, normal rectum bloody colonic effluent throughout the colon with out bleeding lesion seen.   . Givens capsule study N/A 02/14/2014    Distal SB ulcers with  active bleeding noted.  . Esophagogastroduodenoscopy N/A 02/13/2014    KDX:[id number] gland polyps. Pancreatic rest. 2 tiny areas of erosions/ulceration-not likely significant-s/p bx  . Colonoscopy N/A 02/16/2014    KNL:[id number] appearing TI. Moderate sized internal hemorrhoids/TheTC/[place] colon are redundant/TWO COLON POLYPS REMOVED  . Nm myoview ltd  2012    Negative for Ischemia  . Polypectomy    . Doppler echocardiography    . Stress dipyridamole myocardial perfusion    . Cardiovascular stress test    . Cardiac catheterization     Social History:  reports that he has never smoked.  He has never used smokeless tobacco. He reports that he does not drink alcohol or use illicit drugs.  Allergies  Allergen Reactions  . Asa [Aspirin] Other (See Comments)    GI bleeding  . Latex Other (See Comments)    unknown  . Propoxyphene     Other reaction(s): Dizziness  . Sulfa Antibiotics Other (See Comments)    unknown  . Betadine [Povidone Iodine] Rash    Family History  Problem Relation Age of Onset  . Aneurysm Daughter     brain, deceased age 58  . GI problems Brother     gi bleed  . Cancer Sister     unknown type  . Colon cancer Neg Hx   . Liver disease Neg Hx      Prior to Admission medications   Medication Sig Start Date End Date Taking? Authorizing Provider  acetaminophen (TYLENOL) 325 MG tablet Take 650 mg by mouth every 6 (six) hours as needed for mild pain or headache (takes as needed; not daily).   Yes Historical Provider, MD  amLODipine (NORVASC) 5 MG tablet Take 7.5 mg by mouth daily.   Yes Historical Provider, MD  Cholecalciferol (VITAMIN D) 2000 UNITS CAPS Take 1 capsule by mouth daily.   Yes Historical Provider, MD  Choline Fenofibrate (TRILIPIX) 135 MG capsule Take 135 mg by mouth at bedtime.    Yes Historical Provider, MD  ferrous sulfate 325 (65 FE) MG tablet Take 325 mg by mouth daily.    Yes Historical Provider, MD  finasteride (PROSCAR) 5 MG tablet Take 5 mg by mouth daily. 10/04/14  Yes Historical Provider, MD  furosemide (LASIX) 20 MG tablet Take 20 mg by mouth daily.   Yes Historical Provider, MD  HYDROcodone-acetaminophen (NORCO) 10-325 MG per tablet Take 1 tablet by mouth every 6 (six) hours as needed for severe pain (has not taken "in as long as I can remember"). 01/24/14  Yes Historical Provider, MD  insulin lispro protamine-lispro (HUMALOG 75/25 MIX) (75-25) 100 UNIT/ML SUSP injection Inject 60 Units into the skin 2 (two) times daily with a meal. 01/29/14  Yes Kathie Dike, MD  losartan (COZAAR) 25 MG tablet Take 12.5 mg by mouth daily.   Yes  Historical Provider, MD  meclizine (ANTIVERT) 25 MG tablet Take 25 mg by mouth daily as needed for dizziness or nausea.    Yes Historical Provider, MD  nebivolol (BYSTOLIC) 10 MG tablet Take 10 mg by mouth every morning.    Yes Historical Provider, MD  pantoprazole (PROTONIX) 40 MG tablet Take 40 mg by mouth daily. 02/19/14  Yes Samuella Cota, MD  potassium chloride (KLOR-CON M10) 10 MEQ tablet Take 1 tablet (10 mEq total) by mouth daily. <please make appointment for refills> 05/09/14  Yes Troy Sine, MD  tamsulosin (FLOMAX) 0.4 MG CAPS capsule Take 0.4 mg by mouth daily.   Yes Historical Provider, MD  terazosin (  HYTRIN) 10 MG capsule Take 10 mg by mouth at bedtime.   Yes Historical Provider, MD   Physical Exam: Filed Vitals:   12/26/14 1642 12/26/14 1843 12/26/14 2106  BP: 149/60 144/54 143/55  Pulse: 73 67 76  Temp: 97.6 F (36.4 C) 98 F (36.7 C) 98.1 F (36.7 C)  TempSrc: Oral  Oral  Resp: 16 18 24   SpO2: 95% 94% 92%    Wt Readings from Last 3 Encounters:  11/28/14 108.364 kg (238 lb 14.4 oz)  10/25/14 112.492 kg (248 lb)  09/27/14 108.523 kg (239 lb 4 oz)    General: Appears mildly anxious Eyes:  PERRL, normal lids, irises & no conjunctival injection ENT: Dry mucous membranes Neck:  no LAD, masses or thyromegaly Cardiovascular: Difficult to appreciate heart sounds due to dominant respiratory wheezing and rhonchi. Regular rate and rhythm, 1-2+ lower 70 pitting edema bilateral which is at baseline. Telemetry:  SR, no arrhythmias  Respiratory: Diffuse wheezing with intermittent rhonchi in all lung fields, increased effort, on room air at 93% Abdomen:  soft, ntnd Skin:  Copious amounts of seborrheic keratoses, no rash  Musculoskeletal:  grossly normal tone BUE/BLE Psychiatric:  grossly normal mood and affect, speech fluent and appropriate Neurologic:  grossly non-focal.          Labs on Admission:  Basic Metabolic Panel:  Recent Labs Lab 12/26/14 2011  NA 139    K 3.8  CL 107  CO2 21*  GLUCOSE 127*  BUN 69*  CREATININE 3.90*  CALCIUM 8.5*   Liver Function Tests: No results for input(s): AST, ALT, ALKPHOS, BILITOT, PROT, ALBUMIN in the last 168 hours. No results for input(s): LIPASE, AMYLASE in the last 168 hours. No results for input(s): AMMONIA in the last 168 hours. CBC:  Recent Labs Lab 12/26/14 2011  WBC 11.6*  NEUTROABS 8.1*  HGB 10.3*  HCT 32.7*  MCV 89.1  PLT 334   Cardiac Enzymes:  Recent Labs Lab 12/26/14 2011  TROPONINI <0.03    BNP (last 3 results)  Recent Labs  11/25/14 1855 12/26/14 2011  BNP 66.0 38.0    ProBNP (last 3 results)  Recent Labs  01/27/14 1923 02/12/14 1118  PROBNP 2210.0* 507.3*    CBG: No results for input(s): GLUCAP in the last 168 hours.  Radiological Exams on Admission: Dg Chest 2 View  12/26/2014   CLINICAL DATA:  Weakness with productive cough for 2 weeks. History of diabetes and pneumonia. Initial encounter.  EXAM: CHEST  2 VIEW  COMPARISON:  Radiographs 11/25/2014 and 09/18/2014.  FINDINGS: Cardiomegaly, aortic atherosclerosis and vascular congestion are stable. There are slightly lower lung volumes with increased patchy left lower lobe airspace disease. Mild atelectasis at the right lung base appears unchanged. No significant pleural effusion identified. The bones appear unchanged.  IMPRESSION: Mildly increased left lower lobe patchy airspace disease may reflect worsening atelectasis or developing pneumonia. No other significant changes.   Electronically Signed   By: Richardean Sale M.D.   On: 12/26/2014 17:06    EKG: Independently reviewed. Sinus, RBBB, no sign of ACS  Assessment/Plan Principal Problem:   HCAP (healthcare-associated pneumonia) Active Problems:   Essential hypertension   GERD   BPH (benign prostatic hypertrophy)   DM type 2 (diabetes mellitus, type 2)   Chronic diastolic congestive heart failure   CKD (chronic kidney disease) stage 4, GFR 15-29  ml/min   HLD (hyperlipidemia)   HCAP: Symptoms and chest x-ray concerning for pneumonia. Recently admitted to Hospital. WBC 11.6. Decreased  air movement on exam without history of asthma or COPD. Non-smoker. - tele - Vanc/Zos (Cefepime out of stock) - Strep Ag, Legionella Ag, Sputum Cx - BCX - Duoneb Q4, Solu-Medrol 1251 (consider daily Steroids based on exam in the am)  DM: Last A1c 8.5 - SSI  Diastolic CHF: EF 38-18% and grade 1 diastolic dysfunction as of May 2016. No evidence of acute failure. BNP 38. Troponin normal. Home Lasix recently changed from 20 every morning 40 daily at bedtime to 40 twice a day. - Continue Lasix - Continue ARB, beta blocker, potassium  CKD IV-V: Creatinine 3.9. Baseline 3.45. - BMET in am  HTN: normotensive - Continue Bystolic, Norvasc, losartan  BPH:  Continue Proscar, flomax,   GERD: - Continue PPI  HLD: - Continue Trilipix  Code Status: FULL DVT Prophylaxis: Hep Family Communication: Daughter Disposition Plan: PEnding improvemtn    Lyndon Chapel J, MD Family Medicine [facility] [email or web address] Password [email or web address]

## 2014-12-26 NOTE — ED Notes (Signed)
Pt sent by Dr. Royetta Asal for cough/congestion/fever. Pt recently admitted to hospital for renal failure and chf.

## 2014-12-26 NOTE — ED Provider Notes (Signed)
CSN: [id number]     Arrival date & time 12/26/14  1638 History   First MD Initiated Contact with Patient 12/26/14 2010     Chief Complaint  Patient presents with  . Cough     (Consider location/radiation/quality/duration/timing/severity/associated sxs/prior Treatment) Patient is a 81 y.o. male presenting with cough. The history is provided by the patient.  Cough Cough characteristics:  Productive Associated symptoms: chills, shortness of breath and wheezing   Associated symptoms: no chest pain and no headaches    patient has been feeling bad the last several days. Has had cough productive of some white sputum. States he thinks he has had a fever but did not take her temperature. She been more short of breath. More difficulty doing his daily activities. Had been admitted to the hospital for heart failure around a month ago. Had a follow-up visit with Dr. Royetta Asal.  Past Medical History  Diagnosis Date  . GI bleed     2006, TCS/TI showed small polyp. EGD showed erosive antral gastritis with two polyp, one oozing and tx. HP neg. SB capsule shoed few jejunal ulcers with bleeding (naproxen and ASA)  . GI bleed     02/2010, EGD->pancreatitc rest, fundal gland polyps,  no bleeding. TCS->blood-tinged colonic effluent throughout the colon without bleeding lesion found, nl TI. SB capsule->SB erosions, nonbleeding, incomplete study. Patient on naproxyn.   . DM (diabetes mellitus)   . Pneumonia   . Hyperlipidemia   . HTN (hypertension)   . Stroke   . GERD (gastroesophageal reflux disease)   . Poor vision     left eye  . Sleep apnea   . Obesity   . Ischemic colitis, enteritis, or enterocolitis     flex sig 01/2010  . Gastric AVM 10/24/10    GI bleed EGD w/ bicap and hemoclip placement, 2 AVMs, presented w/bright red rectal bleeding  . GI bleed 10/27/10    ileocolonoscopy/GIVENS capsule study by Dr Rourk->bloody colonic effluent throughout colon but no lesion identified, TA @ hepatic flexure, fresh  blood in dital SB on capsule. Nuc med RBC study negative  . Tubular adenoma of colon 10/27/10    on colonoscopy Dr Gala Romney  . Seborrheic keratoses      back and chest  . Bundle branch block, right   . Hypoxia     requiring oxygen while in hospital  . Nephrolithiasis   . Chronic renal insufficiency   . Kidney disease   . CHF (congestive heart failure)    Past Surgical History  Procedure Laterality Date  . Back surgery    . Transurethral resection of prostate    . Cataract extraction      bilateral  . Appendectomy    . Cholecystectomy    . Elbow surgery    . Esophagogastroduodenoscopy  10/23/10    Dr. Tora Kindred arteriovenous malformations,GI bleed- capsule  . Colonoscopy  10/27/10    Dr. Chauncy Lean adenoma, normal rectum bloody colonic effluent throughout the colon with out bleeding lesion seen.   . Givens capsule study N/A 02/14/2014    Distal SB ulcers with active bleeding noted.  . Esophagogastroduodenoscopy N/A 02/13/2014    EYC:[id number] gland polyps. Pancreatic rest. 2 tiny areas of erosions/ulceration-not likely significant-s/p bx  . Colonoscopy N/A 02/16/2014    HUD:[id number] appearing TI. Moderate sized internal hemorrhoids/TheTC/[place] colon are redundant/TWO COLON POLYPS REMOVED  . Nm myoview ltd  2012    Negative for Ischemia  . Polypectomy    . Doppler echocardiography    .  Stress dipyridamole myocardial perfusion    . Cardiovascular stress test    . Cardiac catheterization     Family History  Problem Relation Age of Onset  . Aneurysm Daughter     brain, deceased age 27  . GI problems Brother     gi bleed  . Cancer Sister     unknown type  . Colon cancer Neg Hx   . Liver disease Neg Hx    History  Substance Use Topics  . Smoking status: Never Smoker   . Smokeless tobacco: Never Used  . Alcohol Use: No    Review of Systems  Constitutional: Positive for chills, appetite change and fatigue.  Respiratory: Positive for cough, shortness of breath and wheezing.    Cardiovascular: Positive for leg swelling. Negative for chest pain.  Genitourinary: Negative for flank pain.  Musculoskeletal: Negative for back pain.  Skin: Negative for wound.  Neurological: Negative for headaches.      Allergies  Asa; Latex; Propoxyphene; Sulfa antibiotics; and Betadine  Home Medications   Prior to Admission medications   Medication Sig Start Date End Date Taking? Authorizing Provider  acetaminophen (TYLENOL) 325 MG tablet Take 650 mg by mouth every 6 (six) hours as needed for mild pain or headache (takes as needed; not daily).   Yes Historical Provider, MD  amLODipine (NORVASC) 5 MG tablet Take 7.5 mg by mouth daily.   Yes Historical Provider, MD  Cholecalciferol (VITAMIN D) 2000 UNITS CAPS Take 1 capsule by mouth daily.   Yes Historical Provider, MD  Choline Fenofibrate (TRILIPIX) 135 MG capsule Take 135 mg by mouth at bedtime.    Yes Historical Provider, MD  ferrous sulfate 325 (65 FE) MG tablet Take 325 mg by mouth daily.    Yes Historical Provider, MD  finasteride (PROSCAR) 5 MG tablet Take 5 mg by mouth daily. 10/04/14  Yes Historical Provider, MD  furosemide (LASIX) 20 MG tablet Take 20 mg by mouth daily.   Yes Historical Provider, MD  HYDROcodone-acetaminophen (NORCO) 10-325 MG per tablet Take 1 tablet by mouth every 6 (six) hours as needed for severe pain (has not taken "in as long as I can remember"). 01/24/14  Yes Historical Provider, MD  insulin lispro protamine-lispro (HUMALOG 75/25 MIX) (75-25) 100 UNIT/ML SUSP injection Inject 60 Units into the skin 2 (two) times daily with a meal. 01/29/14  Yes Kathie Dike, MD  losartan (COZAAR) 25 MG tablet Take 12.5 mg by mouth daily.   Yes Historical Provider, MD  meclizine (ANTIVERT) 25 MG tablet Take 25 mg by mouth daily as needed for dizziness or nausea.    Yes Historical Provider, MD  nebivolol (BYSTOLIC) 10 MG tablet Take 10 mg by mouth every morning.    Yes Historical Provider, MD  pantoprazole (PROTONIX) 40 MG  tablet Take 40 mg by mouth daily. 02/19/14  Yes Samuella Cota, MD  potassium chloride (KLOR-CON M10) 10 MEQ tablet Take 1 tablet (10 mEq total) by mouth daily. <please make appointment for refills> 05/09/14  Yes Troy Sine, MD  tamsulosin (FLOMAX) 0.4 MG CAPS capsule Take 0.4 mg by mouth daily.   Yes Historical Provider, MD  terazosin (HYTRIN) 10 MG capsule Take 10 mg by mouth at bedtime.   Yes Historical Provider, MD   BP 143/55 mmHg  Pulse 76  Temp(Src) 98.1 F (36.7 C) (Oral)  Resp 24  SpO2 92% Physical Exam  Constitutional: He is oriented to person, place, and time. He appears well-developed and well-nourished.  Patient  is obese  HENT:  Head: Normocephalic and atraumatic.  Eyes: EOM are normal. Pupils are equal, round, and reactive to light.  Neck: Normal range of motion. Neck supple. No JVD present.  Cardiovascular: Normal rate, regular rhythm and normal heart sounds.   Pulmonary/Chest:  Patient is in mild respiratory distress. Pursed lip breathing. Some audible wheezes. Some harsh breath sounds specifically on the right  Abdominal: Soft. Bowel sounds are normal. He exhibits no distension and no mass. There is no tenderness. There is no rebound and no guarding.  Musculoskeletal: Normal range of motion. He exhibits edema.  Bilateral lower extremity pitting edema  Neurological: He is alert and oriented to person, place, and time. No cranial nerve deficit.  Skin: Skin is warm and dry.  Nursing note and vitals reviewed.   ED Course  Procedures (including critical care time) Labs Review Labs Reviewed  CBC WITH DIFFERENTIAL/PLATELET - Abnormal; Notable for the following:    WBC 11.6 (*)    RBC 3.67 (*)    Hemoglobin 10.3 (*)    HCT 32.7 (*)    Neutro Abs 8.1 (*)    All other components within normal limits  BASIC METABOLIC PANEL - Abnormal; Notable for the following:    CO2 21 (*)    Glucose, Bld 127 (*)    BUN 69 (*)    Creatinine, Ser 3.90 (*)    Calcium 8.5 (*)     GFR calc non Af Amer 13 (*)    GFR calc Af Amer 15 (*)    All other components within normal limits  BRAIN NATRIURETIC PEPTIDE  TROPONIN I    Imaging Review Dg Chest 2 View  12/26/2014   CLINICAL DATA:  Weakness with productive cough for 2 weeks. History of diabetes and pneumonia. Initial encounter.  EXAM: CHEST  2 VIEW  COMPARISON:  Radiographs 11/25/2014 and 09/18/2014.  FINDINGS: Cardiomegaly, aortic atherosclerosis and vascular congestion are stable. There are slightly lower lung volumes with increased patchy left lower lobe airspace disease. Mild atelectasis at the right lung base appears unchanged. No significant pleural effusion identified. The bones appear unchanged.  IMPRESSION: Mildly increased left lower lobe patchy airspace disease may reflect worsening atelectasis or developing pneumonia. No other significant changes.   Electronically Signed   By: Richardean Sale M.D.   On: 12/26/2014 17:06     EKG Interpretation   Date/Time:  Wednesday December 26 2014 16:46:55 EDT Ventricular Rate:  73 PR Interval:  158 QRS Duration: 156 QT Interval:  416 QTC Calculation: 458 R Axis:   90 Text Interpretation:  Normal sinus rhythm Right bundle branch block  Abnormal ECG Confirmed by Alvino Chapel  MD, Ovid Curd [phone]) on 12/26/2014  8:43:25 PM      MDM   Final diagnoses:  HCAP (healthcare-associated pneumonia)    Patient with shortness of breath. Cough. X-ray shows possible worsening pneumonia. With the cough and no white count will treat as is. Does not appear to be CHF. Will admit to internal medicine    Davonna Belling, MD 12/26/14 2155

## 2014-12-27 ENCOUNTER — Encounter (HOSPITAL_COMMUNITY): Payer: Self-pay | Admitting: *Deleted

## 2014-12-27 DIAGNOSIS — J441 Chronic obstructive pulmonary disease with (acute) exacerbation: Secondary | ICD-10-CM

## 2014-12-27 DIAGNOSIS — J189 Pneumonia, unspecified organism: Principal | ICD-10-CM

## 2014-12-27 DIAGNOSIS — I5032 Chronic diastolic (congestive) heart failure: Secondary | ICD-10-CM

## 2014-12-27 DIAGNOSIS — J9621 Acute and chronic respiratory failure with hypoxia: Secondary | ICD-10-CM

## 2014-12-27 LAB — COMPREHENSIVE METABOLIC PANEL
ALT: 44 U/L (ref 17–63)
AST: 38 U/L (ref 15–41)
Albumin: 3.1 g/dL — ABNORMAL LOW (ref 3.5–5.0)
Alkaline Phosphatase: 46 U/L (ref 38–126)
Anion gap: 12 (ref 5–15)
BILIRUBIN TOTAL: 0.8 mg/dL (ref 0.3–1.2)
BUN: 67 mg/dL — ABNORMAL HIGH (ref 6–20)
CALCIUM: 8.5 mg/dL — AB (ref 8.9–10.3)
CO2: 20 mmol/L — ABNORMAL LOW (ref 22–32)
Chloride: 106 mmol/L (ref 101–111)
Creatinine, Ser: 3.81 mg/dL — ABNORMAL HIGH (ref 0.61–1.24)
GFR calc Af Amer: 16 mL/min — ABNORMAL LOW (ref >60)
GFR calc non Af Amer: 14 mL/min — ABNORMAL LOW (ref >60)
Glucose, Bld: 239 mg/dL — ABNORMAL HIGH (ref 65–99)
Potassium: 3.6 mmol/L (ref 3.5–5.1)
Sodium: 138 mmol/L (ref 135–145)
Total Protein: 6.8 g/dL (ref 6.5–8.1)

## 2014-12-27 LAB — GLUCOSE, CAPILLARY
GLUCOSE-CAPILLARY: 326 mg/dL — AB (ref 65–99)
Glucose-Capillary: 228 mg/dL — ABNORMAL HIGH (ref 65–99)
Glucose-Capillary: 331 mg/dL — ABNORMAL HIGH (ref 65–99)
Glucose-Capillary: 360 mg/dL — ABNORMAL HIGH (ref 65–99)

## 2014-12-27 LAB — STREP PNEUMONIAE URINARY ANTIGEN: Strep Pneumo Urinary Antigen: NEGATIVE

## 2014-12-27 MED ORDER — GUAIFENESIN ER 600 MG PO TB12
1200.0000 mg | ORAL_TABLET | Freq: Two times a day (BID) | ORAL | Status: DC
  Administered 2014-12-27 – 2014-12-29 (×4): 1200 mg via ORAL
  Filled 2014-12-27 (×4): qty 2

## 2014-12-27 MED ORDER — METHYLPREDNISOLONE SODIUM SUCC 125 MG IJ SOLR
60.0000 mg | Freq: Two times a day (BID) | INTRAMUSCULAR | Status: DC
  Administered 2014-12-28: 60 mg via INTRAVENOUS
  Filled 2014-12-27 (×2): qty 2

## 2014-12-27 MED ORDER — INSULIN ASPART PROT & ASPART (70-30 MIX) 100 UNIT/ML ~~LOC~~ SUSP
60.0000 [IU] | Freq: Two times a day (BID) | SUBCUTANEOUS | Status: DC
  Administered 2014-12-28 – 2014-12-29 (×3): 60 [IU] via SUBCUTANEOUS
  Filled 2014-12-27: qty 10

## 2014-12-27 MED ORDER — PIPERACILLIN-TAZOBACTAM IN DEX 2-0.25 GM/50ML IV SOLN
INTRAVENOUS | Status: AC
  Filled 2014-12-27: qty 100

## 2014-12-27 MED ORDER — CETYLPYRIDINIUM CHLORIDE 0.05 % MT LIQD
7.0000 mL | Freq: Two times a day (BID) | OROMUCOSAL | Status: DC
  Administered 2014-12-27 – 2014-12-29 (×5): 7 mL via OROMUCOSAL

## 2014-12-27 MED ORDER — PIPERACILLIN SOD-TAZOBACTAM SO 2.25 (2-0.25) G IV SOLR
2.2500 g | Freq: Three times a day (TID) | INTRAVENOUS | Status: DC
  Administered 2014-12-27 – 2014-12-28 (×4): 2.25 g via INTRAVENOUS
  Filled 2014-12-27 (×13): qty 2.25

## 2014-12-27 MED ORDER — ALBUTEROL SULFATE (2.5 MG/3ML) 0.083% IN NEBU: 2.5000 mg | INHALATION_SOLUTION | RESPIRATORY_TRACT | Status: DC | PRN

## 2014-12-27 NOTE — Progress Notes (Signed)
Inpatient Diabetes Program Recommendations  AACE/ADA: New Consensus Statement on Inpatient Glycemic Control (2013)  Target Ranges:  Prepandial:   less than 140 mg/dL      Peak postprandial:   less than 180 mg/dL (1-2 hours)      Critically ill patients:  140 - 180 mg/dL   Results for PASQUAL, FARIAS (MRN [id number]) as of 12/27/2014 14:09  Ref. Range 12/26/2014 23:04 12/27/2014 07:44 12/27/2014 11:21  Glucose-Capillary Latest Ref Range: 65-99 mg/dL 160 (H) 228 (H) 331 (H)    Diabetes history: DM2 Outpatient Diabetes medications: 75/25 60 units BID Current orders for Inpatient glycemic control: Novolog 0-20 units TID with meals, Novolog 0-5 units HS  Inpatient Diabetes Program Recommendations Insulin - Basal: Patient takes 70/30 60 units BID as an outpatient. Please consider ordering 70/30 16 units BID (which will provide 22 units for basal and 10 units for meal coverage per day).  Thanks, Barnie Alderman, RN, MSN, CCRN, CDE Diabetes Coordinator Inpatient Diabetes Program [phone] (Team Pager from [place] to [place]) [phone] ([facility] office) [phone] [facility] office) [phone] [facility] office)

## 2014-12-27 NOTE — Progress Notes (Signed)
Flutter given to pt and pt blew once and coughed and refuse to do stated he wasn't going to do it because he didn't want to cough.

## 2014-12-27 NOTE — Care Management Note (Signed)
Case Management Note  Patient Details  Name: John [name] MRN: [id number] Date of Birth: 1930/10/24  Subjective/Objective:                  Pt admitted from home with pneumonia. Pt lives alone and has a daughter who is very active in the care of the pt. Pt has cane and walker for home use. Pt also active with [facility] RN, PT, and aide.  Action/Plan: Will follow for discharge planning needs. Will need home O2 assessment prior to discharge.  Expected Discharge Date:  12/29/14               Expected Discharge Plan:  [place]  In-House Referral:  NA  Discharge planning Services  CM Consult  Post Acute Care Choice:  Resumption of Svcs/PTA Provider Choice offered to:  Patient  DME Arranged:    DME Agency:     HH Arranged:  RN, PT, Nurse's Aide [place] Agency:  [place]  Status of Service:  In process, will continue to follow  Medicare Important Message Given:    Date Medicare IM Given:    Medicare IM give by:    Date Additional Medicare IM Given:    Additional Medicare Important Message give by:     If discussed at [place] of Stay Meetings, dates discussed:    Additional Comments:  Joylene Draft, RN 12/27/2014, 4:11 PM

## 2014-12-27 NOTE — Progress Notes (Signed)
[facility] PROGRESS NOTE  John [name] JXB:[id number] DOB: 1931-02-19 DOA: 12/26/2014 PCP: Purvis Kilts, MD  Assessment/Plan: 1. Acute respiratory failure with hypoxia. Likely multifactorial in the setting of pneumonia and COPD. Wean oxygen as tolerated 2. healthcare associated pneumonia. Patient has had recent hospitalization. Continue vancomycin and Zosyn. De-escalate antibiotic as condition improves 3. COPD exacerbation. Patient continues to wheeze despite being on nebulizer treatments. Will start on intravenous steroids. He is already on antibiotic. 4. Chronic diastolic congestive heart failure. Appears to be compensated at this point. Continue Lasix, ARB, beta blocker. 5. 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 Baseline creatinine is 3.45. Continue to follow. 6. BPH. Continue Proscar and Flomax. 7. GERD. Continue Protonix 8. Diabetes, uncontrolled in the setting of steroids.. Continue sliding scale insulin.  Code Status: Full code Family Communication: Discussed with patient Disposition Plan: Discharge home once improved   Consultants:    Procedures:    Antibiotics:  Zosyn 6/29>>  vancomycin 6/29>>  HPI/Subjective: Patient has dry cough. Feels short of breath. No chest pain  Objective: Filed Vitals:   12/27/14 1508  BP: 140/53  Pulse: 87  Temp: 97.6 F (36.4 C)  Resp: 18    Intake/Output Summary (Last 24 hours) at 12/27/14 1827 Last data filed at 12/27/14 1508  Gross per 24 hour  Intake    243 ml  Output    700 ml  Net   -457 ml   Filed Weights   12/26/14 2106 12/26/14 2300 12/27/14 0022  Weight: 108.4 kg (238 lb 15.7 oz) 108.863 kg (240 lb) 109.459 kg (241 lb 5 oz)    Exam:   General:  Laying in bed, mildly increased work of breathing  Cardiovascular: S1, S2, regular rate and rhythm  Respiratory: Bilateral wheezes  Abdomen: Soft, nontender, positive bowel sounds  Musculoskeletal: No lower extremity edema   Data  Reviewed: Basic Metabolic Panel:  Recent Labs Lab 12/26/14 2011 12/27/14 0629  NA 139 138  K 3.8 3.6  CL 107 106  CO2 21* 20*  GLUCOSE 127* 239*  BUN 69* 67*  CREATININE 3.90* 3.81*  CALCIUM 8.5* 8.5*   Liver Function Tests:  Recent Labs Lab 12/27/14 0629  AST 38  ALT 44  ALKPHOS 46  BILITOT 0.8  PROT 6.8  ALBUMIN 3.1*   No results for input(s): LIPASE, AMYLASE in the last 168 hours. No results for input(s): AMMONIA in the last 168 hours. CBC:  Recent Labs Lab 12/26/14 2011  WBC 11.6*  NEUTROABS 8.1*  HGB 10.3*  HCT 32.7*  MCV 89.1  PLT 334   Cardiac Enzymes:  Recent Labs Lab 12/26/14 2011  TROPONINI <0.03   BNP (last 3 results)  Recent Labs  11/25/14 1855 12/26/14 2011  BNP 66.0 38.0    ProBNP (last 3 results)  Recent Labs  01/27/14 1923 02/12/14 1118  PROBNP 2210.0* 507.3*    CBG:  Recent Labs Lab 12/26/14 2304 12/27/14 0744 12/27/14 1121 12/27/14 1643  GLUCAP 160* 228* 331* 360*    Recent Results (from the past 240 hour(s))  Culture, blood (routine x 2) Call MD if unable to obtain prior to antibiotics being given     Status: None (Preliminary result)   Collection Time: 12/26/14 10:05 PM  Result Value Ref Range Status   Specimen Description BLOOD RIGHT HAND  Final   Special Requests BOTTLES DRAWN AEROBIC AND ANAEROBIC 6CC  Final   Culture NO GROWTH < 24 HOURS  Final   Report Status PENDING  Incomplete  Culture, blood (routine x 2) Call MD if unable to obtain prior to antibiotics being given     Status: None (Preliminary result)   Collection Time: 12/26/14 10:10 PM  Result Value Ref Range Status   Specimen Description BLOOD LEFT HAND  Final   Special Requests BOTTLES DRAWN AEROBIC ONLY [place]  Final   Culture NO GROWTH < 24 HOURS  Final   Report Status PENDING  Incomplete     Studies: Dg Chest 2 View  12/26/2014   CLINICAL DATA:  Weakness with productive cough for 2 weeks. History of diabetes and pneumonia. Initial  encounter.  EXAM: CHEST  2 VIEW  COMPARISON:  Radiographs 11/25/2014 and 09/18/2014.  FINDINGS: Cardiomegaly, aortic atherosclerosis and vascular congestion are stable. There are slightly lower lung volumes with increased patchy left lower lobe airspace disease. Mild atelectasis at the right lung base appears unchanged. No significant pleural effusion identified. The bones appear unchanged.  IMPRESSION: Mildly increased left lower lobe patchy airspace disease may reflect worsening atelectasis or developing pneumonia. No other significant changes.   Electronically Signed   By: Richardean Sale M.D.   On: 12/26/2014 17:06    Scheduled Meds: . amLODipine  7.5 mg Oral Daily  . antiseptic oral rinse  7 mL Mouth Rinse BID  . fenofibrate  160 mg Oral Daily  . finasteride  5 mg Oral Daily  . furosemide  40 mg Oral BID  . heparin  5,000 Units Subcutaneous 3 times per day  . insulin aspart  0-20 Units Subcutaneous TID WC  . insulin aspart  0-5 Units Subcutaneous QHS  . ipratropium-albuterol  3 mL Nebulization Once  . ipratropium-albuterol  3 mL Nebulization Q4H  . losartan  12.5 mg Oral Daily  . methylPREDNISolone (SOLU-MEDROL) injection  60 mg Intravenous Q12H  . nebivolol  10 mg Oral BH-q7a  . pantoprazole  40 mg Oral Daily  . piperacillin-tazobactam (ZOSYN)  IV  2.25 g Intravenous 3 times per day  . potassium chloride  10 mEq Oral Daily  . sodium chloride  3 mL Intravenous Q12H  . tamsulosin  0.4 mg Oral Daily  . [START ON 12/28/2014] vancomycin  1,500 mg Intravenous Q48H   Continuous Infusions:   Principal Problem:   HCAP (healthcare-associated pneumonia) Active Problems:   Essential hypertension   GERD   BPH (benign prostatic hypertrophy)   DM type 2 (diabetes mellitus, type 2)   Chronic diastolic congestive heart failure   CKD (chronic kidney disease) stage 4, GFR 15-29 ml/min   HLD (hyperlipidemia)    Time spent: 4mins    Lazariah Savard  [facility] Pager [phone]. If  7PM-7AM, please contact night-coverage at [email or web address], password [facility] 12/27/2014, 6:27 PM  LOS: 1 day

## 2014-12-28 DIAGNOSIS — N400 Enlarged prostate without lower urinary tract symptoms: Secondary | ICD-10-CM

## 2014-12-28 DIAGNOSIS — E118 Type 2 diabetes mellitus with unspecified complications: Secondary | ICD-10-CM

## 2014-12-28 DIAGNOSIS — N184 Chronic kidney disease, stage 4 (severe): Secondary | ICD-10-CM

## 2014-12-28 LAB — GLUCOSE, CAPILLARY
GLUCOSE-CAPILLARY: 253 mg/dL — AB (ref 65–99)
GLUCOSE-CAPILLARY: 311 mg/dL — AB (ref 65–99)
Glucose-Capillary: 176 mg/dL — ABNORMAL HIGH (ref 65–99)
Glucose-Capillary: 205 mg/dL — ABNORMAL HIGH (ref 65–99)

## 2014-12-28 LAB — BASIC METABOLIC PANEL
ANION GAP: 9 (ref 5–15)
BUN: 74 mg/dL — AB (ref 6–20)
CALCIUM: 8.8 mg/dL — AB (ref 8.9–10.3)
CHLORIDE: 109 mmol/L (ref 101–111)
CO2: 23 mmol/L (ref 22–32)
CREATININE: 4.04 mg/dL — AB (ref 0.61–1.24)
GFR calc Af Amer: 15 mL/min — ABNORMAL LOW (ref >60)
GFR calc non Af Amer: 13 mL/min — ABNORMAL LOW (ref >60)
Glucose, Bld: 205 mg/dL — ABNORMAL HIGH (ref 65–99)
Potassium: 4 mmol/L (ref 3.5–5.1)
SODIUM: 141 mmol/L (ref 135–145)

## 2014-12-28 LAB — LEGIONELLA ANTIGEN, URINE

## 2014-12-28 LAB — HIV ANTIBODY (ROUTINE TESTING W REFLEX): HIV SCREEN 4TH GENERATION: NONREACTIVE

## 2014-12-28 MED ORDER — IPRATROPIUM-ALBUTEROL 0.5-2.5 (3) MG/3ML IN SOLN
3.0000 mL | Freq: Four times a day (QID) | RESPIRATORY_TRACT | Status: DC
  Administered 2014-12-29 (×3): 3 mL via RESPIRATORY_TRACT
  Filled 2014-12-28 (×3): qty 3

## 2014-12-28 MED ORDER — PREDNISONE 20 MG PO TABS
40.0000 mg | ORAL_TABLET | Freq: Every day | ORAL | Status: DC
  Administered 2014-12-29: 40 mg via ORAL
  Filled 2014-12-28: qty 2

## 2014-12-28 MED ORDER — LEVOFLOXACIN 750 MG PO TABS
750.0000 mg | ORAL_TABLET | Freq: Every day | ORAL | Status: DC
  Administered 2014-12-28: 750 mg via ORAL
  Filled 2014-12-28 (×2): qty 1

## 2014-12-28 NOTE — Care Management (Signed)
Important Message  Patient Details  Name: John [name] MRN: [id number] Date of Birth: Sep 28, 1930   Medicare Important Message Given:  Yes-second notification given    Sherald Barge, RN 12/28/2014, 12:27 PM

## 2014-12-28 NOTE — Progress Notes (Signed)
ANTIBIOTIC CONSULT NOTE - FOLLOW UP  Pharmacy Consult for Vancomycin and Zosyn  Indication: pneumonia HCAP  Allergies  Allergen Reactions  . Asa [Aspirin] Other (See Comments)    GI bleeding  . Latex Other (See Comments)    unknown  . Propoxyphene     Other reaction(s): Dizziness  . Sulfa Antibiotics Other (See Comments)    unknown  . Betadine [Povidone Iodine] Rash   Patient Measurements: Height: 5\' 7"  (170.2 cm) Weight: 241 lb 5 oz (109.459 kg) IBW/kg (Calculated) : 66.1  Vital Signs: Temp: 97.7 F (36.5 C) (07/01 0600) Temp Source: Oral (07/01 0600) BP: 128/55 mmHg (07/01 0600) Pulse Rate: 71 (07/01 0600) Intake/Output from previous day: 06/30 0701 - 07/01 0700 In: 723 [P.O.:720; I.V.:3] Out: 1400 [Urine:1400] Intake/Output from this shift: Total I/O In: 460 [P.O.:360; IV Piggyback:100] Out: -   Labs:  Recent Labs  12/26/14 2011 12/27/14 0629 12/28/14 0641  WBC 11.6*  --   --   HGB 10.3*  --   --   PLT 334  --   --   CREATININE 3.90* 3.81* 4.04*   Estimated Creatinine Clearance: 16.9 mL/min (by C-G formula based on Cr of 4.04). No results for input(s): VANCOTROUGH, VANCOPEAK, VANCORANDOM, GENTTROUGH, GENTPEAK, GENTRANDOM, TOBRATROUGH, TOBRAPEAK, TOBRARND, AMIKACINPEAK, AMIKACINTROU, AMIKACIN in the last 72 hours.   Microbiology: Recent Results (from the past 720 hour(s))  Culture, blood (routine x 2) Call MD if unable to obtain prior to antibiotics being given     Status: None (Preliminary result)   Collection Time: 12/26/14 10:05 PM  Result Value Ref Range Status   Specimen Description BLOOD RIGHT HAND  Final   Special Requests BOTTLES DRAWN AEROBIC AND ANAEROBIC 6CC  Final   Culture NO GROWTH < 24 HOURS  Final   Report Status PENDING  Incomplete  Culture, blood (routine x 2) Call MD if unable to obtain prior to antibiotics being given     Status: None (Preliminary result)   Collection Time: 12/26/14 10:10 PM  Result Value Ref Range Status   Specimen Description BLOOD LEFT HAND  Final   Special Requests BOTTLES DRAWN AEROBIC ONLY 6CC  Final   Culture NO GROWTH < 24 HOURS  Final   Report Status PENDING  Incomplete    Anti-infectives    Start     Dose/Rate Route Frequency Ordered Stop   12/28/14 2245  vancomycin (VANCOCIN) 1,500 mg in sodium chloride 0.9 % 500 mL IVPB     1,500 mg 250 mL/hr over 120 Minutes Intravenous Every 48 hours 12/26/14 2231     12/27/14 1400  piperacillin-tazobactam (ZOSYN) 2.25 g in dextrose 5 % 50 mL IVPB     2.25 g 100 mL/hr over 30 Minutes Intravenous 3 times per day 12/27/14 0821     12/27/14 0600  piperacillin-tazobactam (ZOSYN) IVPB 2.25 g  Status:  Discontinued     2.25 g 100 mL/hr over 30 Minutes Intravenous 3 times per day 12/26/14 2231 12/27/14 0822   12/26/14 2230  vancomycin (VANCOCIN) IVPB 1000 mg/200 mL premix     1,000 mg 200 mL/hr over 60 Minutes Intravenous  Once 12/26/14 2231 12/27/14 0001   12/26/14 2030  piperacillin-tazobactam (ZOSYN) IVPB 3.375 g     3.375 g 100 mL/hr over 30 Minutes Intravenous  Once 12/26/14 2019 12/26/14 2124   12/26/14 2030  vancomycin (VANCOCIN) IVPB 1000 mg/200 mL premix     1,000 mg 200 mL/hr over 60 Minutes Intravenous  Once 12/26/14 2019 12/26/14 2221  Assessment: Okay for Protocol, initial doses ordered in ED. CKD IV-V: Creatinine 3.9. Baseline 3.45. Obesity/Normalized CrCl Vancomycin dosing protocol will be initiated with an estimated normalized CrCl = 14.9 ml/min.  Currently afebrile.  Goal of Therapy:  Vancomycin trough level 15-20 mcg/ml  Plan:  Vancomycin 1500mg  IV every 48 hours. Measure antibiotic drug levels at steady state Follow up culture results Zosyn 2.25gm IV every 8 hours. Follow-up micro data, labs, vitals.   [place] Crane, Ligaya Cormier A 12/28/2014,10:49 AM

## 2014-12-28 NOTE — Progress Notes (Signed)
[facility] PROGRESS NOTE  John [name] NGE:[id number] DOB: 23-Jan-1931 DOA: 12/26/2014 PCP: Purvis Kilts, MD  Assessment/Plan: 1. Acute respiratory failure with hypoxia. Likely multifactorial in the setting of pneumonia and COPD. Wean oxygen as tolerated 2. healthcare associated pneumonia. Patient has had recent hospitalization. Clinically, he appears to be improving. We'll de-escalate antibiotic [place]: 3. COPD exacerbation. Wheezing has improved. Change IV steroids to prednisone taper. Continue antibiotic and bronchodilators. 4. Chronic diastolic congestive heart failure. Appears to be compensated at this point. Continue beta blocker. Lasix and ARB currently on hold due to worsening creatinine 5. 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 Baseline creatinine is 3.45. Creatinine is trending up. Will hold ARB and Lasix for now. 6. BPH. Continue Proscar and Flomax. 7. GERD. Continue Protonix 8. Diabetes, uncontrolled in the setting of steroids.. Continue sliding scale insulin.  Code Status: Full code Family Communication: Discussed with patient Disposition Plan: Discharge home once improved   Consultants:    Procedures:    Antibiotics:  Zosyn 6/29>> 7/1  vancomycin 6/29>> 7/1  Levaquin 7/1>>  HPI/Subjective: Overall feeling better today. Shortness of breath improving.  Objective: Filed Vitals:   12/28/14 1310  BP: 135/58  Pulse: 79  Temp: 97.5 F (36.4 C)  Resp: 18    Intake/Output Summary (Last 24 hours) at 12/28/14 1730 Last data filed at 12/28/14 1600  Gross per 24 hour  Intake   1230 ml  Output   1300 ml  Net    -70 ml   Filed Weights   12/26/14 2106 12/26/14 2300 12/27/14 0022  Weight: 108.4 kg (238 lb 15.7 oz) 108.863 kg (240 lb) 109.459 kg (241 lb 5 oz)    Exam:   General:  Laying in bed, no distress  Cardiovascular: S1, S2, regular rate and rhythm  Respiratory: Clear bilaterally, no wheezes  Abdomen: Soft, nontender, positive  bowel sounds  Musculoskeletal: No lower extremity edema   Data Reviewed: Basic Metabolic Panel:  Recent Labs Lab 12/26/14 2011 12/27/14 0629 12/28/14 0641  NA 139 138 141  K 3.8 3.6 4.0  CL 107 106 109  CO2 21* 20* 23  GLUCOSE 127* 239* 205*  BUN 69* 67* 74*  CREATININE 3.90* 3.81* 4.04*  CALCIUM 8.5* 8.5* 8.8*   Liver Function Tests:  Recent Labs Lab 12/27/14 0629  AST 38  ALT 44  ALKPHOS 46  BILITOT 0.8  PROT 6.8  ALBUMIN 3.1*   No results for input(s): LIPASE, AMYLASE in the last 168 hours. No results for input(s): AMMONIA in the last 168 hours. CBC:  Recent Labs Lab 12/26/14 2011  WBC 11.6*  NEUTROABS 8.1*  HGB 10.3*  HCT 32.7*  MCV 89.1  PLT 334   Cardiac Enzymes:  Recent Labs Lab 12/26/14 2011  TROPONINI <0.03   BNP (last 3 results)  Recent Labs  11/25/14 1855 12/26/14 2011  BNP 66.0 38.0    ProBNP (last 3 results)  Recent Labs  01/27/14 1923 02/12/14 1118  PROBNP 2210.0* 507.3*    CBG:  Recent Labs Lab 12/27/14 1643 12/27/14 2130 12/28/14 0758 12/28/14 1121 12/28/14 1649  GLUCAP 360* 326* 176* 253* 311*    Recent Results (from the past 240 hour(s))  Culture, blood (routine x 2) Call MD if unable to obtain prior to antibiotics being given     Status: None (Preliminary result)   Collection Time: 12/26/14 10:05 PM  Result Value Ref Range Status   Specimen Description BLOOD RIGHT HAND  Final   Special Requests BOTTLES  DRAWN AEROBIC AND ANAEROBIC 6CC  Final   Culture NO GROWTH < 24 HOURS  Final   Report Status PENDING  Incomplete  Culture, blood (routine x 2) Call MD if unable to obtain prior to antibiotics being given     Status: None (Preliminary result)   Collection Time: 12/26/14 10:10 PM  Result Value Ref Range Status   Specimen Description BLOOD LEFT HAND  Final   Special Requests BOTTLES DRAWN AEROBIC ONLY [place]  Final   Culture NO GROWTH < 24 HOURS  Final   Report Status PENDING  Incomplete     Studies: No  results found.  Scheduled Meds: . amLODipine  7.5 mg Oral Daily  . antiseptic oral rinse  7 mL Mouth Rinse BID  . fenofibrate  160 mg Oral Daily  . finasteride  5 mg Oral Daily  . guaiFENesin  1,200 mg Oral BID  . heparin  5,000 Units Subcutaneous 3 times per day  . insulin aspart  0-20 Units Subcutaneous TID WC  . insulin aspart  0-5 Units Subcutaneous QHS  . insulin aspart protamine- aspart  60 Units Subcutaneous BID WC  . ipratropium-albuterol  3 mL Nebulization Once  . ipratropium-albuterol  3 mL Nebulization Q4H  . levofloxacin  750 mg Oral Daily  . nebivolol  10 mg Oral BH-q7a  . pantoprazole  40 mg Oral Daily  . potassium chloride  10 mEq Oral Daily  . [START ON 12/29/2014] predniSONE  40 mg Oral Q breakfast  . sodium chloride  3 mL Intravenous Q12H  . tamsulosin  0.4 mg Oral Daily   Continuous Infusions:   Principal Problem:   HCAP (healthcare-associated pneumonia) Active Problems:   Essential hypertension   GERD   BPH (benign prostatic hypertrophy)   DM type 2 (diabetes mellitus, type 2)   Chronic diastolic congestive heart failure   CKD (chronic kidney disease) stage 4, GFR 15-29 ml/min   HLD (hyperlipidemia)    Time spent: 61mins    MEMON,JEHANZEB  [facility] Pager [phone]. If 7PM-7AM, please contact night-coverage at [email or web address], password [facility] 12/28/2014, 5:30 PM  LOS: 2 days

## 2014-12-29 DIAGNOSIS — I10 Essential (primary) hypertension: Secondary | ICD-10-CM

## 2014-12-29 LAB — BASIC METABOLIC PANEL
Anion gap: 11 (ref 5–15)
BUN: 78 mg/dL — ABNORMAL HIGH (ref 6–20)
CALCIUM: 9.3 mg/dL (ref 8.9–10.3)
CHLORIDE: 108 mmol/L (ref 101–111)
CO2: 24 mmol/L (ref 22–32)
CREATININE: 3.79 mg/dL — AB (ref 0.61–1.24)
GFR, EST AFRICAN AMERICAN: 16 mL/min — AB (ref >60)
GFR, EST NON AFRICAN AMERICAN: 14 mL/min — AB (ref >60)
Glucose, Bld: 112 mg/dL — ABNORMAL HIGH (ref 65–99)
Potassium: 4.1 mmol/L (ref 3.5–5.1)
Sodium: 143 mmol/L (ref 135–145)

## 2014-12-29 LAB — GLUCOSE, CAPILLARY
Glucose-Capillary: 102 mg/dL — ABNORMAL HIGH (ref 65–99)
Glucose-Capillary: 153 mg/dL — ABNORMAL HIGH (ref 65–99)

## 2014-12-29 MED ORDER — ALBUTEROL SULFATE (2.5 MG/3ML) 0.083% IN NEBU: 2.5000 mg | INHALATION_SOLUTION | RESPIRATORY_TRACT | Status: AC | PRN

## 2014-12-29 MED ORDER — LEVOFLOXACIN 500 MG PO TABS: 500.0000 mg | ORAL_TABLET | ORAL | Status: DC

## 2014-12-29 MED ORDER — GUAIFENESIN ER 600 MG PO TB12: 1200.0000 mg | ORAL_TABLET | Freq: Two times a day (BID) | ORAL | Status: DC

## 2014-12-29 MED ORDER — PREDNISONE 10 MG PO TABS: ORAL_TABLET | ORAL | Status: DC

## 2014-12-29 NOTE — Progress Notes (Signed)
NURSING PROGRESS NOTE  John [name] [id number] Discharge Data: 12/29/2014 4:15 PM Attending Provider: No att. providers found AQL:[id number], Betsy Coder, MD   [name] to be D/C'd Home per MD order.    All IV's discontinued and monitored for bleeding.  All belongings returned to patient for patient to take home.  Cardiac monitor discontinued.   AVS summary reviewed with patient and family.  Patient left floor via wheelchair, escorted by NT.  Last Documented Vital Signs:  Blood pressure 147/72, pulse 71, temperature 98.1 F (36.7 C), temperature source Oral, resp. rate 20, height 5\' 7"  (1.702 m), weight 106.686 kg (235 lb 3.2 oz), SpO2 95 %.  Cecilie Kicks D

## 2014-12-29 NOTE — Progress Notes (Signed)
ANTIBIOTIC CONSULT NOTE - FOLLOW UP  Pharmacy Consult for Renal Dose Adjustment (Levaquin) Indication: pneumonia HCAP  Allergies  Allergen Reactions  . Asa [Aspirin] Other (See Comments)    GI bleeding  . Latex Other (See Comments)    unknown  . Propoxyphene     Other reaction(s): Dizziness  . Sulfa Antibiotics Other (See Comments)    unknown  . Betadine [Povidone Iodine] Rash   Patient Measurements: Height: 5\' 7"  (170.2 cm) Weight: 235 lb 3.2 oz (106.686 kg) IBW/kg (Calculated) : 66.1  Vital Signs: Temp: 98 F (36.7 C) (07/02 0637) Temp Source: Oral (07/02 0973) BP: 159/69 mmHg (07/02 5329) Pulse Rate: 67 (07/02 0637) Intake/Output from previous day: 07/01 0701 - 07/02 0700 In: 990 [P.O.:840; IV Piggyback:150] Out: 1400 [Urine:1400] Intake/Output from this shift:    Labs:  Recent Labs  12/26/14 2011 12/27/14 0629 12/28/14 0641 12/29/14 0702  WBC 11.6*  --   --   --   HGB 10.3*  --   --   --   PLT 334  --   --   --   CREATININE 3.90* 3.81* 4.04* 3.79*   Estimated Creatinine Clearance: 17.8 mL/min (by C-G formula based on Cr of 3.79). No results for input(s): VANCOTROUGH, VANCOPEAK, VANCORANDOM, GENTTROUGH, GENTPEAK, GENTRANDOM, TOBRATROUGH, TOBRAPEAK, TOBRARND, AMIKACINPEAK, AMIKACINTROU, AMIKACIN in the last 72 hours.   Microbiology: Recent Results (from the past 720 hour(s))  Culture, blood (routine x 2) Call MD if unable to obtain prior to antibiotics being given     Status: None (Preliminary result)   Collection Time: 12/26/14 10:05 PM  Result Value Ref Range Status   Specimen Description BLOOD RIGHT HAND  Final   Special Requests BOTTLES DRAWN AEROBIC AND ANAEROBIC 6CC  Final   Culture NO GROWTH 3 DAYS  Final   Report Status PENDING  Incomplete  Culture, blood (routine x 2) Call MD if unable to obtain prior to antibiotics being given     Status: None (Preliminary result)   Collection Time: 12/26/14 10:10 PM  Result Value Ref Range Status   Specimen  Description BLOOD LEFT HAND  Final   Special Requests BOTTLES DRAWN AEROBIC ONLY 6CC  Final   Culture NO GROWTH 3 DAYS  Final   Report Status PENDING  Incomplete    Anti-infectives    Start     Dose/Rate Route Frequency Ordered Stop   12/30/14 1000  levofloxacin (LEVAQUIN) tablet 500 mg     500 mg Oral Every 48 hours 12/29/14 0842     12/28/14 2100  vancomycin (VANCOCIN) 1,500 mg in sodium chloride 0.9 % 500 mL IVPB  Status:  Discontinued     1,500 mg 250 mL/hr over 120 Minutes Intravenous Every 48 hours 12/26/14 2231 12/28/14 1730   12/28/14 1800  levofloxacin (LEVAQUIN) tablet 750 mg  Status:  Discontinued     750 mg Oral Daily 12/28/14 1730 12/29/14 0842   12/27/14 1400  piperacillin-tazobactam (ZOSYN) 2.25 g in dextrose 5 % 50 mL IVPB  Status:  Discontinued     2.25 g 100 mL/hr over 30 Minutes Intravenous 3 times per day 12/27/14 0821 12/28/14 1730   12/27/14 0600  piperacillin-tazobactam (ZOSYN) IVPB 2.25 g  Status:  Discontinued     2.25 g 100 mL/hr over 30 Minutes Intravenous 3 times per day 12/26/14 2231 12/27/14 0822   12/26/14 2230  vancomycin (VANCOCIN) IVPB 1000 mg/200 mL premix     1,000 mg 200 mL/hr over 60 Minutes Intravenous  Once 12/26/14 2231  12/27/14 0001   12/26/14 2030  piperacillin-tazobactam (ZOSYN) IVPB 3.375 g     3.375 g 100 mL/hr over 30 Minutes Intravenous  Once 12/26/14 2019 12/26/14 2124   12/26/14 2030  vancomycin (VANCOCIN) IVPB 1000 mg/200 mL premix     1,000 mg 200 mL/hr over 60 Minutes Intravenous  Once 12/26/14 2019 12/26/14 2221     Assessment: Okay for Protocol, initial doses ordered in ED. CKD IV-V: Creatinine 3.9. Baseline 3.45. Obesity/Normalized CrCl Vancomycin dosing protocol initiated with an estimated normalized CrCl = 14.9 ml/min.  Currently afebrile. Vancomycin and Zosyn deescalated to Levaquin  Goal of Therapy:  Eradicate infection.  Plan:  Levaquin 750mg  x 1 then 500mg  PO q48hrs (renally adjusted) Follow-up micro data, labs,  vitals.   Hart Robinsons A 12/29/2014,8:43 AM

## 2014-12-29 NOTE — Progress Notes (Signed)
SATURATION QUALIFICATIONS: (This note is used to comply with regulatory documentation for home oxygen)  Patient Saturations on Room Air at Rest = 96%  Patient Saturations on Room Air while Ambulating = 95%  Patient Saturations on X Liters of oxygen while Ambulating = X%  Please briefly explain why patient needs home oxygen: N/A

## 2014-12-29 NOTE — Discharge Summary (Signed)
Physician Discharge Summary  John [name] HDQ:[id number] DOB: 1931/04/21 DOA: 12/26/2014  PCP: Purvis Kilts, MD  Admit date: 12/26/2014 Discharge date: 12/29/2014  Time spent: 40 minutes  Recommendations for Outpatient Follow-up:  1. Follow up with primary care physician in 1-2 weeks  Discharge Diagnoses:  Principal Problem:   HCAP (healthcare-associated pneumonia) Active Problems:   Essential hypertension   GERD   BPH (benign prostatic hypertrophy)   DM type 2 (diabetes mellitus, type 2)   Chronic diastolic congestive heart failure   CKD (chronic kidney disease) stage 4, GFR 15-29 ml/min   HLD (hyperlipidemia)   Discharge Condition: improved  Diet recommendation: low salt, low carb  Filed Weights   12/26/14 2300 12/27/14 0022 12/29/14 0637  Weight: 108.863 kg (240 lb) 109.459 kg (241 lb 5 oz) 106.686 kg (235 lb 3.2 oz)    History of present illness:  This patient presents to the hospital with cough and progressive shortness of breath. He has known COPD. Evaluation emergency room indicated possible pneumonia. He was admitted to the hospital for further treatment.  Hospital Course:  1. Acute respiratory failure with hypoxia. Likely multifactorial in the setting of pneumonia and COPD. patient was able to wean down to room air. He is now ambulating on room air and maintaining saturations greater than 90%. 2. healthcare associated pneumonia. Patient has had recent hospitalization. He initially received broad-spectrum antibiotics. Once his clinical condition improved, antibiotics were narrowed to Levaquin. He is now afebrile and his cough is improving. 3. COPD exacerbation. Patient was started on intravenous steroids and subsequently changed to prednisone taper. He was continued on nebulizer treatments and antibiotic. Wheezing has not resolved. Shortness of breath has improved. 4. Chronic diastolic congestive heart failure. This remained compensated during his stay. He was  continued on Lasix and beta blockers. 5. 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 Baseline creatinine is 3.45. Creatinine did trend up to greater than 4. ARB has been discontinued for now. He'll be restarted on Lasix. Creatinine is trending back down to 3.7. 6. BPH. Continue Proscar and Flomax. 7. GERD. Continue Protonix 8. Diabetes, patient was continued on his outpatient dose of insulin. Blood sugars remained relatively stable. Anticipate they should improve as steroids are tapered.  Procedures:    Consultations:    Discharge Exam: Filed Vitals:   12/29/14 1311  BP: 147/72  Pulse: 71  Temp: 98.1 F (36.7 C)  Resp: 20    General: NAD Cardiovascular: s1, s2 rrr Respiratory: CTA B  Discharge Instructions   Discharge Instructions    Diet - low sodium heart healthy    Complete by:  As directed      Diet Carb Modified    Complete by:  As directed      Increase activity slowly    Complete by:  As directed           Discharge Medication List as of 12/29/2014  3:32 PM    START taking these medications   Details  albuterol (PROVENTIL) (2.5 MG/3ML) 0.083% nebulizer solution Take 3 mLs (2.5 mg total) by nebulization every 2 (two) hours as needed for wheezing or shortness of breath., Starting 12/29/2014, Until Discontinued, Print    guaiFENesin (MUCINEX) 600 MG 12 hr tablet Take 2 tablets (1,200 mg total) by mouth 2 (two) times daily., Starting 12/29/2014, Until Discontinued, Print    levofloxacin (LEVAQUIN) 500 MG tablet Take 1 tablet (500 mg total) by mouth every other day., Starting 12/30/2014, Until Discontinued, Print  predniSONE (DELTASONE) 10 MG tablet Take 40mg  po daily for 2 days then 30mg  po daily for 2 days then 20mg  po daily for 2 days then 10mg  po daily for 2 days then stop, Print      CONTINUE these medications which have NOT CHANGED   Details  acetaminophen (TYLENOL) 325 MG tablet Take 650 mg by mouth every 6 (six) hours as needed for mild pain or headache  (takes as needed; not daily)., Until Discontinued, Historical Med    amLODipine (NORVASC) 5 MG tablet Take 7.5 mg by mouth daily., Until Discontinued, Historical Med    Cholecalciferol (VITAMIN D) 2000 UNITS CAPS Take 1 capsule by mouth daily., Until Discontinued, Historical Med    Choline Fenofibrate (TRILIPIX) 135 MG capsule Take 135 mg by mouth at bedtime. , Until Discontinued, Historical Med    ferrous sulfate 325 (65 FE) MG tablet Take 325 mg by mouth daily. , Until Discontinued, Historical Med    finasteride (PROSCAR) 5 MG tablet Take 5 mg by mouth daily., Starting 10/04/2014, Until Discontinued, Historical Med    furosemide (LASIX) 20 MG tablet Take 20 mg by mouth daily., Until Discontinued, Historical Med    HYDROcodone-acetaminophen (NORCO) 10-325 MG per tablet Take 1 tablet by mouth every 6 (six) hours as needed for severe pain (has not taken "in as long as I can remember")., Starting 01/24/2014, Until Discontinued, Historical Med    insulin lispro protamine-lispro (HUMALOG 75/25 MIX) (75-25) 100 UNIT/ML SUSP injection Inject 60 Units into the skin 2 (two) times daily with a meal., Starting 01/29/2014, Until Discontinued, Historical Med    meclizine (ANTIVERT) 25 MG tablet Take 25 mg by mouth daily as needed for dizziness or nausea. , Until Discontinued, Historical Med    nebivolol (BYSTOLIC) 10 MG tablet Take 10 mg by mouth every morning. , Until Discontinued, Historical Med    pantoprazole (PROTONIX) 40 MG tablet Take 40 mg by mouth daily., Starting 02/19/2014, Until Discontinued, Historical Med    potassium chloride (KLOR-CON M10) 10 MEQ tablet Take 1 tablet (10 mEq total) by mouth daily. <please make appointment for refills>, Starting 05/09/2014, Until Discontinued, Normal    tamsulosin (FLOMAX) 0.4 MG CAPS capsule Take 0.4 mg by mouth daily., Until Discontinued, Historical Med    terazosin (HYTRIN) 10 MG capsule Take 10 mg by mouth at bedtime., Until Discontinued, Historical Med       STOP taking these medications     losartan (COZAAR) 25 MG tablet        Allergies  Allergen Reactions  . Asa [Aspirin] Other (See Comments)    GI bleeding  . Latex Other (See Comments)    unknown  . Propoxyphene     Other reaction(s): Dizziness  . Sulfa Antibiotics Other (See Comments)    unknown  . Betadine [Povidone Iodine] Rash   Follow-up Information    Follow up with [facility]   Contact information:   [address] [place] [address] [phone]       Follow up with Purvis Kilts, MD. Schedule an appointment as soon as possible for a visit in 2 weeks.   Specialty:  Family Medicine   Contact information:   [address] [place] [address] [phone]        The results of significant diagnostics from this hospitalization (including imaging, microbiology, ancillary and laboratory) are listed below for reference.    Significant Diagnostic Studies: Dg Chest 2 View  12/26/2014   CLINICAL DATA:  Weakness  with productive cough for 2 weeks. History of diabetes and pneumonia. Initial encounter.  EXAM: CHEST  2 VIEW  COMPARISON:  Radiographs 11/25/2014 and 09/18/2014.  FINDINGS: Cardiomegaly, aortic atherosclerosis and vascular congestion are stable. There are slightly lower lung volumes with increased patchy left lower lobe airspace disease. Mild atelectasis at the right lung base appears unchanged. No significant pleural effusion identified. The bones appear unchanged.  IMPRESSION: Mildly increased left lower lobe patchy airspace disease may reflect worsening atelectasis or developing pneumonia. No other significant changes.   Electronically Signed   By: Richardean Sale M.D.   On: 12/26/2014 17:06    Microbiology: Recent Results (from the past 240 hour(s))  Culture, blood (routine x 2) Call MD if unable to obtain prior to antibiotics being given     Status: None (Preliminary result)   Collection Time: 12/26/14 10:05  PM  Result Value Ref Range Status   Specimen Description BLOOD RIGHT HAND  Final   Special Requests BOTTLES DRAWN AEROBIC AND ANAEROBIC 6CC  Final   Culture NO GROWTH 3 DAYS  Final   Report Status PENDING  Incomplete  Culture, blood (routine x 2) Call MD if unable to obtain prior to antibiotics being given     Status: None (Preliminary result)   Collection Time: 12/26/14 10:10 PM  Result Value Ref Range Status   Specimen Description BLOOD LEFT HAND  Final   Special Requests BOTTLES DRAWN AEROBIC ONLY 6CC  Final   Culture NO GROWTH 3 DAYS  Final   Report Status PENDING  Incomplete     Labs: Basic Metabolic Panel:  Recent Labs Lab 12/26/14 2011 12/27/14 0629 12/28/14 0641 12/29/14 0702  NA 139 138 141 143  K 3.8 3.6 4.0 4.1  CL 107 106 109 108  CO2 21* 20* 23 24  GLUCOSE 127* 239* 205* 112*  BUN 69* 67* 74* 78*  CREATININE 3.90* 3.81* 4.04* 3.79*  CALCIUM 8.5* 8.5* 8.8* 9.3   Liver Function Tests:  Recent Labs Lab 12/27/14 0629  AST 38  ALT 44  ALKPHOS 46  BILITOT 0.8  PROT 6.8  ALBUMIN 3.1*   No results for input(s): LIPASE, AMYLASE in the last 168 hours. No results for input(s): AMMONIA in the last 168 hours. CBC:  Recent Labs Lab 12/26/14 2011  WBC 11.6*  NEUTROABS 8.1*  HGB 10.3*  HCT 32.7*  MCV 89.1  PLT 334   Cardiac Enzymes:  Recent Labs Lab 12/26/14 2011  TROPONINI <0.03   BNP: BNP (last 3 results)  Recent Labs  11/25/14 1855 12/26/14 2011  BNP 66.0 38.0    ProBNP (last 3 results)  Recent Labs  01/27/14 1923 02/12/14 1118  PROBNP 2210.0* 507.3*    CBG:  Recent Labs Lab 12/28/14 1121 12/28/14 1649 12/28/14 2154 12/29/14 0759 12/29/14 1202  GLUCAP 253* 311* 205* 102* 153*       Signed:  MEMON,JEHANZEB  [facility] 12/29/2014, 5:54 PM

## 2015-01-01 DIAGNOSIS — N184 Chronic kidney disease, stage 4 (severe): Secondary | ICD-10-CM | POA: Diagnosis not present

## 2015-01-01 DIAGNOSIS — I129 Hypertensive chronic kidney disease with stage 1 through stage 4 chronic kidney disease, or unspecified chronic kidney disease: Secondary | ICD-10-CM | POA: Diagnosis not present

## 2015-01-01 DIAGNOSIS — R627 Adult failure to thrive: Secondary | ICD-10-CM | POA: Diagnosis not present

## 2015-01-01 DIAGNOSIS — E785 Hyperlipidemia, unspecified: Secondary | ICD-10-CM | POA: Diagnosis not present

## 2015-01-01 DIAGNOSIS — I5032 Chronic diastolic (congestive) heart failure: Secondary | ICD-10-CM | POA: Diagnosis not present

## 2015-01-01 DIAGNOSIS — E119 Type 2 diabetes mellitus without complications: Secondary | ICD-10-CM | POA: Diagnosis not present

## 2015-01-02 DIAGNOSIS — I129 Hypertensive chronic kidney disease with stage 1 through stage 4 chronic kidney disease, or unspecified chronic kidney disease: Secondary | ICD-10-CM | POA: Diagnosis not present

## 2015-01-02 DIAGNOSIS — E785 Hyperlipidemia, unspecified: Secondary | ICD-10-CM | POA: Diagnosis not present

## 2015-01-02 DIAGNOSIS — R627 Adult failure to thrive: Secondary | ICD-10-CM | POA: Diagnosis not present

## 2015-01-02 DIAGNOSIS — I5032 Chronic diastolic (congestive) heart failure: Secondary | ICD-10-CM | POA: Diagnosis not present

## 2015-01-02 DIAGNOSIS — N184 Chronic kidney disease, stage 4 (severe): Secondary | ICD-10-CM | POA: Diagnosis not present

## 2015-01-02 DIAGNOSIS — E119 Type 2 diabetes mellitus without complications: Secondary | ICD-10-CM | POA: Diagnosis not present

## 2015-01-03 DIAGNOSIS — I129 Hypertensive chronic kidney disease with stage 1 through stage 4 chronic kidney disease, or unspecified chronic kidney disease: Secondary | ICD-10-CM | POA: Diagnosis not present

## 2015-01-03 DIAGNOSIS — E785 Hyperlipidemia, unspecified: Secondary | ICD-10-CM | POA: Diagnosis not present

## 2015-01-03 DIAGNOSIS — R627 Adult failure to thrive: Secondary | ICD-10-CM | POA: Diagnosis not present

## 2015-01-03 DIAGNOSIS — N184 Chronic kidney disease, stage 4 (severe): Secondary | ICD-10-CM | POA: Diagnosis not present

## 2015-01-03 DIAGNOSIS — I5032 Chronic diastolic (congestive) heart failure: Secondary | ICD-10-CM | POA: Diagnosis not present

## 2015-01-03 DIAGNOSIS — E119 Type 2 diabetes mellitus without complications: Secondary | ICD-10-CM | POA: Diagnosis not present

## 2015-01-03 LAB — CULTURE, BLOOD (ROUTINE X 2)
CULTURE: NO GROWTH
CULTURE: NO GROWTH

## 2015-01-07 ENCOUNTER — Other Ambulatory Visit: Payer: Self-pay | Admitting: *Deleted

## 2015-01-07 NOTE — Patient Outreach (Signed)
[place] [facility]) Care Management  01/07/2015  John [name] June 25, 1931 [id number]  I have tried to reach [name] on several occasions over the last 6 weeks but have been unsuccessful. He has been in and out of the hospital. I've left multiple messages and sent a letter to the home but have not received return calls.   I called his home again today and his brother answered the phone telling me that [name] was not in but was expected home later this evening.   I will try to reach [name] at home again tomorrow.    [facility]  [phone]

## 2015-01-08 ENCOUNTER — Telehealth: Payer: Self-pay | Admitting: *Deleted

## 2015-01-08 DIAGNOSIS — N184 Chronic kidney disease, stage 4 (severe): Secondary | ICD-10-CM | POA: Diagnosis not present

## 2015-01-08 DIAGNOSIS — I5032 Chronic diastolic (congestive) heart failure: Secondary | ICD-10-CM | POA: Diagnosis not present

## 2015-01-08 DIAGNOSIS — I129 Hypertensive chronic kidney disease with stage 1 through stage 4 chronic kidney disease, or unspecified chronic kidney disease: Secondary | ICD-10-CM | POA: Diagnosis not present

## 2015-01-08 DIAGNOSIS — E785 Hyperlipidemia, unspecified: Secondary | ICD-10-CM | POA: Diagnosis not present

## 2015-01-08 DIAGNOSIS — E119 Type 2 diabetes mellitus without complications: Secondary | ICD-10-CM | POA: Diagnosis not present

## 2015-01-08 DIAGNOSIS — R627 Adult failure to thrive: Secondary | ICD-10-CM | POA: Diagnosis not present

## 2015-01-08 NOTE — Patient Outreach (Signed)
[place] [facility]) Care Management  01/08/2015  John [name] Weekly May 09, 1931 [id number]   I was finally able to reach [name] by phone today. I have been unable to reach him during the time he was in and out of the hospital and since he discharged to home last week. We have scheduled a face to face visit for Thursday at [facility]  [phone]

## 2015-01-10 ENCOUNTER — Other Ambulatory Visit: Payer: Self-pay | Admitting: *Deleted

## 2015-01-10 ENCOUNTER — Encounter: Payer: Self-pay | Admitting: *Deleted

## 2015-01-10 DIAGNOSIS — N184 Chronic kidney disease, stage 4 (severe): Secondary | ICD-10-CM | POA: Diagnosis not present

## 2015-01-10 DIAGNOSIS — I5032 Chronic diastolic (congestive) heart failure: Secondary | ICD-10-CM | POA: Diagnosis not present

## 2015-01-10 DIAGNOSIS — I129 Hypertensive chronic kidney disease with stage 1 through stage 4 chronic kidney disease, or unspecified chronic kidney disease: Secondary | ICD-10-CM | POA: Diagnosis not present

## 2015-01-10 DIAGNOSIS — E119 Type 2 diabetes mellitus without complications: Secondary | ICD-10-CM | POA: Diagnosis not present

## 2015-01-10 DIAGNOSIS — R627 Adult failure to thrive: Secondary | ICD-10-CM | POA: Diagnosis not present

## 2015-01-10 DIAGNOSIS — E785 Hyperlipidemia, unspecified: Secondary | ICD-10-CM | POA: Diagnosis not present

## 2015-01-10 NOTE — Patient Outreach (Signed)
[place] [facility]) Care Management   01/10/2015  John [name] 1930-06-30 [id number]  John [name] is an 81 y.o. male  Subjective: John [name] was referred to [facility] and disease management assistance for his multiple complex medical problems including DM2, hyperlipidemia, anemia, HTN, and history of CVA. He has had improvement in self monitoring and now weighs, checks bp and pulse, checks cbg's, and documents daily. He takes medications as prescribed. His daughter John [name] fills his pill box weekly. Mr. Fendley attends provider appointments as scheduled. He has had difficulty making changes in his diet and does not adhere to prescribed carb modified, heart healthy diet. Mr. Wimberly has declined referral to dietician/nutritionist. His last known HGA1C is 7.3.   Mr. Brizzi was recently hospitalized twice for respiratory failure/COPD exacerbation.   Objective:  BP 126/60 mmHg  Wt 239 lb (108.41 kg)  Review of Systems  Constitutional: Negative.   HENT: Negative.   Eyes: Positive for blurred vision.       Reports worsening blurry vision secondary to "macular degeneration" unchanged from previous visits  Respiratory: Negative.   Cardiovascular: Negative.   Gastrointestinal: Negative.   Genitourinary: Negative.   Musculoskeletal: Negative.  Negative for falls.  Skin: Negative.   Neurological: Negative.   Endo/Heme/Allergies: Negative.   Psychiatric/Behavioral: Negative.     Physical Exam  Constitutional: He is oriented to person, place, and time. Vital signs are normal. He appears well-developed and well-nourished. He is active.  Cardiovascular: Normal rate and regular rhythm.   Murmur heard. Respiratory: Effort normal. He has decreased breath sounds in the right lower field and the left lower field. He has no rhonchi. He has no rales.  GI: Soft. Bowel sounds are normal.  Neurological: He is alert and oriented to person, place, and time.  Skin: Skin is warm, dry  and intact.  Psychiatric: He has a normal mood and affect. His speech is normal and behavior is normal. Judgment and thought content normal. Cognition and memory are normal.    Current Medications:   Current Outpatient Prescriptions  Medication Sig Dispense Refill  . acetaminophen (TYLENOL) 325 MG tablet Take 650 mg by mouth every 6 (six) hours as needed for mild pain or headache (takes as needed; not daily).    Marland Kitchen albuterol (PROVENTIL) (2.5 MG/3ML) 0.083% nebulizer solution Take 3 mLs (2.5 mg total) by nebulization every 2 (two) hours as needed for wheezing or shortness of breath. 75 mL 12  . amLODipine (NORVASC) 5 MG tablet Take 7.5 mg by mouth daily.    . Cholecalciferol (VITAMIN D) 2000 UNITS CAPS Take 1 capsule by mouth daily.    . Choline Fenofibrate (TRILIPIX) 135 MG capsule Take 135 mg by mouth at bedtime.     . ferrous sulfate 325 (65 FE) MG tablet Take 325 mg by mouth daily.     . finasteride (PROSCAR) 5 MG tablet Take 5 mg by mouth daily.    . furosemide (LASIX) 20 MG tablet Take 20 mg by mouth daily.    Marland Kitchen guaiFENesin (MUCINEX) 600 MG 12 hr tablet Take 2 tablets (1,200 mg total) by mouth 2 (two) times daily. 20 tablet 0  . HYDROcodone-acetaminophen (NORCO) 10-325 MG per tablet Take 1 tablet by mouth every 6 (six) hours as needed for severe pain (has not taken "in as long as I can remember").    . insulin lispro protamine-lispro (HUMALOG 75/25 MIX) (75-25) 100 UNIT/ML SUSP injection Inject 60 Units into the skin 2 (two) times daily with  a meal.    . levofloxacin (LEVAQUIN) 500 MG tablet Take 1 tablet (500 mg total) by mouth every other day. (Patient not taking: Reported on 01/08/2015) 2 tablet 0  . meclizine (ANTIVERT) 25 MG tablet Take 25 mg by mouth daily as needed for dizziness or nausea.     . nebivolol (BYSTOLIC) 10 MG tablet Take 10 mg by mouth every morning.     . pantoprazole (PROTONIX) 40 MG tablet Take 40 mg by mouth daily.    . potassium chloride (KLOR-CON M10) 10 MEQ tablet  Take 1 tablet (10 mEq total) by mouth daily. <please make appointment for refills> 90 tablet 0  . predniSONE (DELTASONE) 10 MG tablet Take 73m po daily for 2 days then 359mpo daily for 2 days then 2070mo daily for 2 days then 20m20m daily for 2 days then stop 20 tablet 0  . tamsulosin (FLOMAX) 0.4 MG CAPS capsule Take 0.4 mg by mouth daily.    . teMarland Kitchenazosin (HYTRIN) 10 MG capsule Take 10 mg by mouth at bedtime.     No current facility-administered medications for this visit.    Functional Status:   In your present state of health, do you have any difficulty performing the following activities: 12/27/2014 11/25/2014  Hearing? N N  Vision? N N  Difficulty concentrating or making decisions? N Y  Walking or climbing stairs? N Y  Dressing or bathing? N N  Doing errands, shopping? Y Y  Preparing Food and eating ? - -  Using the Toilet? - -  In the past six months, have you accidently leaked urine? - -  Do you have problems with loss of bowel control? - -  Managing your Medications? - -  Managing your Finances? - -  Housekeeping or managing your Housekeeping? - -    Assessment:    83 y38r old gentleman with comorbid chronic medical conditions. Most recently admitted to the hospital with respiratory failure. Feeling much better since discharge from home. Respiratory assessment stable today. Attending provider appointments as scheduled. Taking medications as prescribed.   Enrolled in Transition of Care program.   Plan:   [facility]        Patient Outreach from 01/10/2015 in [facility]  Recent Hospitalization for respiratory failure/COPD exacerbation   Care Plan for Problem One  Active   THN Long Term Goal (31-90 days)  patient will not be hospitalized in the next 31 days for respiratory/pulmonary illness   THN Long Term Goal Start Date  01/10/15   Interventions for Problem One Long Term Goal  utilizing teachback method, reviewed  with patient discharge materials and goals of care/plan of care   THN CM Short Term Goal #1 (0-30 days)  over the next 30 days patient will verbalize understanding of plan of care for COPD including medication management, symptom assessment, when to call for help   THN CM Short Term Goal #1 Start Date  01/10/15   THN CM Short Term Goal #1 Met Date     Interventions for Short Term Goal #1  utilizing teachback method and stop light tool, reviewed with patient plan fo care for COPD management, medications, and signs and syptoms of worsening COPD   THN CM Short Term Goal #2 (0-30 days)  patient will attend upcoming provider appointment as scheduled and will be able to verbalize provider plan of care changes or recommendations   [facility] Start Date  01/10/15   THN CM Short Term Goal #2 Met Date     Interventions for Short Term Goal #2  utilizing teachback method, reviewed with patient the importance of asking questions during provider visits and making sure he understands provider recommendations and plan of care,  recommended patient take notepad with any questions    days)     THN CM Short Term Goal #3 Start Date  01/10/15   THN CM Short Term Goal #3 Met Date     Interventions for Short Tern Goal #3  utilizing teachback method, discussed value and importance of regular exercise regimen as thereapeutic for blood sugar control using teachback method      [place] [facility] Management  [phone]

## 2015-01-11 DIAGNOSIS — I129 Hypertensive chronic kidney disease with stage 1 through stage 4 chronic kidney disease, or unspecified chronic kidney disease: Secondary | ICD-10-CM | POA: Diagnosis not present

## 2015-01-11 DIAGNOSIS — E119 Type 2 diabetes mellitus without complications: Secondary | ICD-10-CM | POA: Diagnosis not present

## 2015-01-11 DIAGNOSIS — I5032 Chronic diastolic (congestive) heart failure: Secondary | ICD-10-CM | POA: Diagnosis not present

## 2015-01-11 DIAGNOSIS — E785 Hyperlipidemia, unspecified: Secondary | ICD-10-CM | POA: Diagnosis not present

## 2015-01-11 DIAGNOSIS — R627 Adult failure to thrive: Secondary | ICD-10-CM | POA: Diagnosis not present

## 2015-01-11 DIAGNOSIS — N184 Chronic kidney disease, stage 4 (severe): Secondary | ICD-10-CM | POA: Diagnosis not present

## 2015-01-14 DIAGNOSIS — E785 Hyperlipidemia, unspecified: Secondary | ICD-10-CM | POA: Diagnosis not present

## 2015-01-14 DIAGNOSIS — I129 Hypertensive chronic kidney disease with stage 1 through stage 4 chronic kidney disease, or unspecified chronic kidney disease: Secondary | ICD-10-CM | POA: Diagnosis not present

## 2015-01-14 DIAGNOSIS — R627 Adult failure to thrive: Secondary | ICD-10-CM | POA: Diagnosis not present

## 2015-01-14 DIAGNOSIS — N184 Chronic kidney disease, stage 4 (severe): Secondary | ICD-10-CM | POA: Diagnosis not present

## 2015-01-14 DIAGNOSIS — E119 Type 2 diabetes mellitus without complications: Secondary | ICD-10-CM | POA: Diagnosis not present

## 2015-01-14 DIAGNOSIS — I5032 Chronic diastolic (congestive) heart failure: Secondary | ICD-10-CM | POA: Diagnosis not present

## 2015-01-15 DIAGNOSIS — N184 Chronic kidney disease, stage 4 (severe): Secondary | ICD-10-CM | POA: Diagnosis not present

## 2015-01-15 DIAGNOSIS — I5032 Chronic diastolic (congestive) heart failure: Secondary | ICD-10-CM | POA: Diagnosis not present

## 2015-01-15 DIAGNOSIS — E785 Hyperlipidemia, unspecified: Secondary | ICD-10-CM | POA: Diagnosis not present

## 2015-01-15 DIAGNOSIS — R627 Adult failure to thrive: Secondary | ICD-10-CM | POA: Diagnosis not present

## 2015-01-15 DIAGNOSIS — Z6838 Body mass index (BMI) 38.0-38.9, adult: Secondary | ICD-10-CM | POA: Diagnosis not present

## 2015-01-15 DIAGNOSIS — Y95 Nosocomial condition: Secondary | ICD-10-CM | POA: Diagnosis not present

## 2015-01-15 DIAGNOSIS — I129 Hypertensive chronic kidney disease with stage 1 through stage 4 chronic kidney disease, or unspecified chronic kidney disease: Secondary | ICD-10-CM | POA: Diagnosis not present

## 2015-01-15 DIAGNOSIS — E119 Type 2 diabetes mellitus without complications: Secondary | ICD-10-CM | POA: Diagnosis not present

## 2015-01-15 DIAGNOSIS — E118 Type 2 diabetes mellitus with unspecified complications: Secondary | ICD-10-CM | POA: Diagnosis not present

## 2015-01-15 DIAGNOSIS — Z1389 Encounter for screening for other disorder: Secondary | ICD-10-CM | POA: Diagnosis not present

## 2015-01-15 DIAGNOSIS — J168 Pneumonia due to other specified infectious organisms: Secondary | ICD-10-CM | POA: Diagnosis not present

## 2015-01-16 DIAGNOSIS — E1140 Type 2 diabetes mellitus with diabetic neuropathy, unspecified: Secondary | ICD-10-CM | POA: Diagnosis not present

## 2015-01-17 DIAGNOSIS — I5032 Chronic diastolic (congestive) heart failure: Secondary | ICD-10-CM | POA: Diagnosis not present

## 2015-01-17 DIAGNOSIS — N184 Chronic kidney disease, stage 4 (severe): Secondary | ICD-10-CM | POA: Diagnosis not present

## 2015-01-17 DIAGNOSIS — R627 Adult failure to thrive: Secondary | ICD-10-CM | POA: Diagnosis not present

## 2015-01-17 DIAGNOSIS — I129 Hypertensive chronic kidney disease with stage 1 through stage 4 chronic kidney disease, or unspecified chronic kidney disease: Secondary | ICD-10-CM | POA: Diagnosis not present

## 2015-01-17 DIAGNOSIS — E785 Hyperlipidemia, unspecified: Secondary | ICD-10-CM | POA: Diagnosis not present

## 2015-01-17 DIAGNOSIS — E119 Type 2 diabetes mellitus without complications: Secondary | ICD-10-CM | POA: Diagnosis not present

## 2015-01-22 DIAGNOSIS — N184 Chronic kidney disease, stage 4 (severe): Secondary | ICD-10-CM | POA: Diagnosis not present

## 2015-01-22 DIAGNOSIS — R627 Adult failure to thrive: Secondary | ICD-10-CM | POA: Diagnosis not present

## 2015-01-22 DIAGNOSIS — E785 Hyperlipidemia, unspecified: Secondary | ICD-10-CM | POA: Diagnosis not present

## 2015-01-22 DIAGNOSIS — E119 Type 2 diabetes mellitus without complications: Secondary | ICD-10-CM | POA: Diagnosis not present

## 2015-01-22 DIAGNOSIS — I5032 Chronic diastolic (congestive) heart failure: Secondary | ICD-10-CM | POA: Diagnosis not present

## 2015-01-22 DIAGNOSIS — I129 Hypertensive chronic kidney disease with stage 1 through stage 4 chronic kidney disease, or unspecified chronic kidney disease: Secondary | ICD-10-CM | POA: Diagnosis not present

## 2015-01-24 DIAGNOSIS — R627 Adult failure to thrive: Secondary | ICD-10-CM | POA: Diagnosis not present

## 2015-01-24 DIAGNOSIS — E785 Hyperlipidemia, unspecified: Secondary | ICD-10-CM | POA: Diagnosis not present

## 2015-01-24 DIAGNOSIS — I129 Hypertensive chronic kidney disease with stage 1 through stage 4 chronic kidney disease, or unspecified chronic kidney disease: Secondary | ICD-10-CM | POA: Diagnosis not present

## 2015-01-24 DIAGNOSIS — I5032 Chronic diastolic (congestive) heart failure: Secondary | ICD-10-CM | POA: Diagnosis not present

## 2015-01-24 DIAGNOSIS — N184 Chronic kidney disease, stage 4 (severe): Secondary | ICD-10-CM | POA: Diagnosis not present

## 2015-01-24 DIAGNOSIS — E119 Type 2 diabetes mellitus without complications: Secondary | ICD-10-CM | POA: Diagnosis not present

## 2015-01-28 DIAGNOSIS — E785 Hyperlipidemia, unspecified: Secondary | ICD-10-CM | POA: Diagnosis not present

## 2015-01-28 DIAGNOSIS — I5032 Chronic diastolic (congestive) heart failure: Secondary | ICD-10-CM | POA: Diagnosis not present

## 2015-01-28 DIAGNOSIS — R627 Adult failure to thrive: Secondary | ICD-10-CM | POA: Diagnosis not present

## 2015-01-28 DIAGNOSIS — E119 Type 2 diabetes mellitus without complications: Secondary | ICD-10-CM | POA: Diagnosis not present

## 2015-01-28 DIAGNOSIS — N184 Chronic kidney disease, stage 4 (severe): Secondary | ICD-10-CM | POA: Diagnosis not present

## 2015-01-28 DIAGNOSIS — I129 Hypertensive chronic kidney disease with stage 1 through stage 4 chronic kidney disease, or unspecified chronic kidney disease: Secondary | ICD-10-CM | POA: Diagnosis not present

## 2015-01-29 DIAGNOSIS — I129 Hypertensive chronic kidney disease with stage 1 through stage 4 chronic kidney disease, or unspecified chronic kidney disease: Secondary | ICD-10-CM | POA: Diagnosis not present

## 2015-01-29 DIAGNOSIS — R627 Adult failure to thrive: Secondary | ICD-10-CM | POA: Diagnosis not present

## 2015-01-29 DIAGNOSIS — Z8673 Personal history of transient ischemic attack (TIA), and cerebral infarction without residual deficits: Secondary | ICD-10-CM | POA: Diagnosis not present

## 2015-01-29 DIAGNOSIS — E119 Type 2 diabetes mellitus without complications: Secondary | ICD-10-CM | POA: Diagnosis not present

## 2015-01-29 DIAGNOSIS — Z794 Long term (current) use of insulin: Secondary | ICD-10-CM | POA: Diagnosis not present

## 2015-01-29 DIAGNOSIS — N184 Chronic kidney disease, stage 4 (severe): Secondary | ICD-10-CM | POA: Diagnosis not present

## 2015-01-29 DIAGNOSIS — E669 Obesity, unspecified: Secondary | ICD-10-CM | POA: Diagnosis not present

## 2015-01-29 DIAGNOSIS — I5032 Chronic diastolic (congestive) heart failure: Secondary | ICD-10-CM | POA: Diagnosis not present

## 2015-01-29 DIAGNOSIS — E785 Hyperlipidemia, unspecified: Secondary | ICD-10-CM | POA: Diagnosis not present

## 2015-01-29 DIAGNOSIS — J189 Pneumonia, unspecified organism: Secondary | ICD-10-CM | POA: Diagnosis not present

## 2015-01-29 DIAGNOSIS — J441 Chronic obstructive pulmonary disease with (acute) exacerbation: Secondary | ICD-10-CM | POA: Diagnosis not present

## 2015-02-05 DIAGNOSIS — E119 Type 2 diabetes mellitus without complications: Secondary | ICD-10-CM | POA: Diagnosis not present

## 2015-02-05 DIAGNOSIS — I129 Hypertensive chronic kidney disease with stage 1 through stage 4 chronic kidney disease, or unspecified chronic kidney disease: Secondary | ICD-10-CM | POA: Diagnosis not present

## 2015-02-05 DIAGNOSIS — I5032 Chronic diastolic (congestive) heart failure: Secondary | ICD-10-CM | POA: Diagnosis not present

## 2015-02-05 DIAGNOSIS — J189 Pneumonia, unspecified organism: Secondary | ICD-10-CM | POA: Diagnosis not present

## 2015-02-05 DIAGNOSIS — J441 Chronic obstructive pulmonary disease with (acute) exacerbation: Secondary | ICD-10-CM | POA: Diagnosis not present

## 2015-02-05 DIAGNOSIS — N184 Chronic kidney disease, stage 4 (severe): Secondary | ICD-10-CM | POA: Diagnosis not present

## 2015-02-08 DIAGNOSIS — J189 Pneumonia, unspecified organism: Secondary | ICD-10-CM | POA: Diagnosis not present

## 2015-02-08 DIAGNOSIS — I129 Hypertensive chronic kidney disease with stage 1 through stage 4 chronic kidney disease, or unspecified chronic kidney disease: Secondary | ICD-10-CM | POA: Diagnosis not present

## 2015-02-08 DIAGNOSIS — I5032 Chronic diastolic (congestive) heart failure: Secondary | ICD-10-CM | POA: Diagnosis not present

## 2015-02-08 DIAGNOSIS — N184 Chronic kidney disease, stage 4 (severe): Secondary | ICD-10-CM | POA: Diagnosis not present

## 2015-02-08 DIAGNOSIS — J441 Chronic obstructive pulmonary disease with (acute) exacerbation: Secondary | ICD-10-CM | POA: Diagnosis not present

## 2015-02-08 DIAGNOSIS — E119 Type 2 diabetes mellitus without complications: Secondary | ICD-10-CM | POA: Diagnosis not present

## 2015-02-12 ENCOUNTER — Encounter: Payer: Medicare Other | Admitting: *Deleted

## 2015-02-12 NOTE — Progress Notes (Signed)
This encounter was created in error - please disregard.

## 2015-02-12 NOTE — Addendum Note (Signed)
Addended by: Clerance Lav on: 02/12/2015 12:38 PM   Modules accepted: Level of Service, SmartSet

## 2015-02-13 DIAGNOSIS — N184 Chronic kidney disease, stage 4 (severe): Secondary | ICD-10-CM | POA: Diagnosis not present

## 2015-02-13 DIAGNOSIS — I129 Hypertensive chronic kidney disease with stage 1 through stage 4 chronic kidney disease, or unspecified chronic kidney disease: Secondary | ICD-10-CM | POA: Diagnosis not present

## 2015-02-13 DIAGNOSIS — J189 Pneumonia, unspecified organism: Secondary | ICD-10-CM | POA: Diagnosis not present

## 2015-02-13 DIAGNOSIS — I5032 Chronic diastolic (congestive) heart failure: Secondary | ICD-10-CM | POA: Diagnosis not present

## 2015-02-13 DIAGNOSIS — J441 Chronic obstructive pulmonary disease with (acute) exacerbation: Secondary | ICD-10-CM | POA: Diagnosis not present

## 2015-02-13 DIAGNOSIS — E119 Type 2 diabetes mellitus without complications: Secondary | ICD-10-CM | POA: Diagnosis not present

## 2015-02-18 DIAGNOSIS — D649 Anemia, unspecified: Secondary | ICD-10-CM | POA: Diagnosis not present

## 2015-02-18 DIAGNOSIS — D509 Iron deficiency anemia, unspecified: Secondary | ICD-10-CM | POA: Diagnosis not present

## 2015-02-18 DIAGNOSIS — Z79899 Other long term (current) drug therapy: Secondary | ICD-10-CM | POA: Diagnosis not present

## 2015-02-18 DIAGNOSIS — R809 Proteinuria, unspecified: Secondary | ICD-10-CM | POA: Diagnosis not present

## 2015-02-18 DIAGNOSIS — N183 Chronic kidney disease, stage 3 (moderate): Secondary | ICD-10-CM | POA: Diagnosis not present

## 2015-02-18 DIAGNOSIS — E559 Vitamin D deficiency, unspecified: Secondary | ICD-10-CM | POA: Diagnosis not present

## 2015-02-18 DIAGNOSIS — I10 Essential (primary) hypertension: Secondary | ICD-10-CM | POA: Diagnosis not present

## 2015-02-19 DIAGNOSIS — N184 Chronic kidney disease, stage 4 (severe): Secondary | ICD-10-CM | POA: Diagnosis not present

## 2015-02-19 DIAGNOSIS — E559 Vitamin D deficiency, unspecified: Secondary | ICD-10-CM | POA: Diagnosis not present

## 2015-02-19 DIAGNOSIS — I10 Essential (primary) hypertension: Secondary | ICD-10-CM | POA: Diagnosis not present

## 2015-02-19 DIAGNOSIS — R809 Proteinuria, unspecified: Secondary | ICD-10-CM | POA: Diagnosis not present

## 2015-02-19 DIAGNOSIS — D638 Anemia in other chronic diseases classified elsewhere: Secondary | ICD-10-CM | POA: Diagnosis not present

## 2015-02-27 DIAGNOSIS — N184 Chronic kidney disease, stage 4 (severe): Secondary | ICD-10-CM | POA: Diagnosis not present

## 2015-02-27 DIAGNOSIS — E119 Type 2 diabetes mellitus without complications: Secondary | ICD-10-CM | POA: Diagnosis not present

## 2015-02-27 DIAGNOSIS — J441 Chronic obstructive pulmonary disease with (acute) exacerbation: Secondary | ICD-10-CM | POA: Diagnosis not present

## 2015-02-27 DIAGNOSIS — I129 Hypertensive chronic kidney disease with stage 1 through stage 4 chronic kidney disease, or unspecified chronic kidney disease: Secondary | ICD-10-CM | POA: Diagnosis not present

## 2015-02-27 DIAGNOSIS — J189 Pneumonia, unspecified organism: Secondary | ICD-10-CM | POA: Diagnosis not present

## 2015-02-27 DIAGNOSIS — I5032 Chronic diastolic (congestive) heart failure: Secondary | ICD-10-CM | POA: Diagnosis not present

## 2015-03-01 ENCOUNTER — Encounter: Payer: Self-pay | Admitting: *Deleted

## 2015-03-13 DIAGNOSIS — N184 Chronic kidney disease, stage 4 (severe): Secondary | ICD-10-CM | POA: Diagnosis not present

## 2015-03-13 DIAGNOSIS — J189 Pneumonia, unspecified organism: Secondary | ICD-10-CM | POA: Diagnosis not present

## 2015-03-13 DIAGNOSIS — I5032 Chronic diastolic (congestive) heart failure: Secondary | ICD-10-CM | POA: Diagnosis not present

## 2015-03-13 DIAGNOSIS — E119 Type 2 diabetes mellitus without complications: Secondary | ICD-10-CM | POA: Diagnosis not present

## 2015-03-13 DIAGNOSIS — I129 Hypertensive chronic kidney disease with stage 1 through stage 4 chronic kidney disease, or unspecified chronic kidney disease: Secondary | ICD-10-CM | POA: Diagnosis not present

## 2015-03-13 DIAGNOSIS — J441 Chronic obstructive pulmonary disease with (acute) exacerbation: Secondary | ICD-10-CM | POA: Diagnosis not present

## 2015-03-18 DIAGNOSIS — R809 Proteinuria, unspecified: Secondary | ICD-10-CM | POA: Diagnosis not present

## 2015-03-18 DIAGNOSIS — I10 Essential (primary) hypertension: Secondary | ICD-10-CM | POA: Diagnosis not present

## 2015-03-18 DIAGNOSIS — E780 Pure hypercholesterolemia: Secondary | ICD-10-CM | POA: Diagnosis not present

## 2015-03-18 DIAGNOSIS — E1165 Type 2 diabetes mellitus with hyperglycemia: Secondary | ICD-10-CM | POA: Diagnosis not present

## 2015-03-21 DIAGNOSIS — E1165 Type 2 diabetes mellitus with hyperglycemia: Secondary | ICD-10-CM | POA: Diagnosis not present

## 2015-03-22 ENCOUNTER — Encounter: Payer: Self-pay | Admitting: *Deleted

## 2015-03-22 NOTE — Patient Outreach (Signed)
[place] [facility]) Care Management  03/22/2015  John [name] Sep 08, 1930 [id number]   We have been unable to maintain contact with John [name] after a missed/scheduled routine home visit, several outreach phone calls, and a personal letter sent to the patient's home. I have discharged John [name] from [facility] services. We are happy to assist with any case management needs in the future should they arise.    [place] [facility]  [phone]

## 2015-03-26 DIAGNOSIS — E1140 Type 2 diabetes mellitus with diabetic neuropathy, unspecified: Secondary | ICD-10-CM | POA: Diagnosis not present

## 2015-03-27 DIAGNOSIS — I5032 Chronic diastolic (congestive) heart failure: Secondary | ICD-10-CM | POA: Diagnosis not present

## 2015-03-27 DIAGNOSIS — E119 Type 2 diabetes mellitus without complications: Secondary | ICD-10-CM | POA: Diagnosis not present

## 2015-03-27 DIAGNOSIS — J441 Chronic obstructive pulmonary disease with (acute) exacerbation: Secondary | ICD-10-CM | POA: Diagnosis not present

## 2015-03-27 DIAGNOSIS — N184 Chronic kidney disease, stage 4 (severe): Secondary | ICD-10-CM | POA: Diagnosis not present

## 2015-03-27 DIAGNOSIS — I129 Hypertensive chronic kidney disease with stage 1 through stage 4 chronic kidney disease, or unspecified chronic kidney disease: Secondary | ICD-10-CM | POA: Diagnosis not present

## 2015-03-27 DIAGNOSIS — J189 Pneumonia, unspecified organism: Secondary | ICD-10-CM | POA: Diagnosis not present

## 2015-04-25 DIAGNOSIS — N183 Chronic kidney disease, stage 3 (moderate): Secondary | ICD-10-CM | POA: Diagnosis not present

## 2015-04-25 DIAGNOSIS — Z79899 Other long term (current) drug therapy: Secondary | ICD-10-CM | POA: Diagnosis not present

## 2015-04-25 DIAGNOSIS — I10 Essential (primary) hypertension: Secondary | ICD-10-CM | POA: Diagnosis not present

## 2015-04-25 DIAGNOSIS — E559 Vitamin D deficiency, unspecified: Secondary | ICD-10-CM | POA: Diagnosis not present

## 2015-04-25 DIAGNOSIS — D509 Iron deficiency anemia, unspecified: Secondary | ICD-10-CM | POA: Diagnosis not present

## 2015-04-25 DIAGNOSIS — D649 Anemia, unspecified: Secondary | ICD-10-CM | POA: Diagnosis not present

## 2015-04-25 DIAGNOSIS — R809 Proteinuria, unspecified: Secondary | ICD-10-CM | POA: Diagnosis not present

## 2015-05-01 DIAGNOSIS — D509 Iron deficiency anemia, unspecified: Secondary | ICD-10-CM | POA: Diagnosis not present

## 2015-05-01 DIAGNOSIS — N184 Chronic kidney disease, stage 4 (severe): Secondary | ICD-10-CM | POA: Diagnosis not present

## 2015-05-01 DIAGNOSIS — R809 Proteinuria, unspecified: Secondary | ICD-10-CM | POA: Diagnosis not present

## 2015-05-01 DIAGNOSIS — I10 Essential (primary) hypertension: Secondary | ICD-10-CM | POA: Diagnosis not present

## 2015-05-19 DIAGNOSIS — R1084 Generalized abdominal pain: Secondary | ICD-10-CM | POA: Diagnosis not present

## 2015-06-11 DIAGNOSIS — E1140 Type 2 diabetes mellitus with diabetic neuropathy, unspecified: Secondary | ICD-10-CM | POA: Diagnosis not present

## 2015-07-11 DIAGNOSIS — D509 Iron deficiency anemia, unspecified: Secondary | ICD-10-CM | POA: Diagnosis not present

## 2015-07-11 DIAGNOSIS — R809 Proteinuria, unspecified: Secondary | ICD-10-CM | POA: Diagnosis not present

## 2015-07-11 DIAGNOSIS — Z79899 Other long term (current) drug therapy: Secondary | ICD-10-CM | POA: Diagnosis not present

## 2015-07-11 DIAGNOSIS — N183 Chronic kidney disease, stage 3 (moderate): Secondary | ICD-10-CM | POA: Diagnosis not present

## 2015-07-11 DIAGNOSIS — E559 Vitamin D deficiency, unspecified: Secondary | ICD-10-CM | POA: Diagnosis not present

## 2015-07-11 DIAGNOSIS — I10 Essential (primary) hypertension: Secondary | ICD-10-CM | POA: Diagnosis not present

## 2015-07-17 DIAGNOSIS — R809 Proteinuria, unspecified: Secondary | ICD-10-CM | POA: Diagnosis not present

## 2015-07-17 DIAGNOSIS — I10 Essential (primary) hypertension: Secondary | ICD-10-CM | POA: Diagnosis not present

## 2015-07-17 DIAGNOSIS — N184 Chronic kidney disease, stage 4 (severe): Secondary | ICD-10-CM | POA: Diagnosis not present

## 2015-07-17 DIAGNOSIS — E872 Acidosis: Secondary | ICD-10-CM | POA: Diagnosis not present

## 2015-07-30 ENCOUNTER — Encounter: Payer: Self-pay | Admitting: *Deleted

## 2015-08-20 DIAGNOSIS — E1140 Type 2 diabetes mellitus with diabetic neuropathy, unspecified: Secondary | ICD-10-CM | POA: Diagnosis not present

## 2015-09-19 DIAGNOSIS — R809 Proteinuria, unspecified: Secondary | ICD-10-CM | POA: Diagnosis not present

## 2015-09-19 DIAGNOSIS — E1165 Type 2 diabetes mellitus with hyperglycemia: Secondary | ICD-10-CM | POA: Diagnosis not present

## 2015-09-19 DIAGNOSIS — E7800 Pure hypercholesterolemia, unspecified: Secondary | ICD-10-CM | POA: Diagnosis not present

## 2015-09-19 DIAGNOSIS — I10 Essential (primary) hypertension: Secondary | ICD-10-CM | POA: Diagnosis not present

## 2015-09-23 DIAGNOSIS — N3941 Urge incontinence: Secondary | ICD-10-CM | POA: Diagnosis not present

## 2015-09-23 DIAGNOSIS — Z0000 Encounter for general adult medical examination without abnormal findings: Secondary | ICD-10-CM | POA: Diagnosis not present

## 2015-09-23 DIAGNOSIS — N138 Other obstructive and reflux uropathy: Secondary | ICD-10-CM | POA: Diagnosis not present

## 2015-09-23 DIAGNOSIS — R350 Frequency of micturition: Secondary | ICD-10-CM | POA: Diagnosis not present

## 2015-09-23 DIAGNOSIS — N401 Enlarged prostate with lower urinary tract symptoms: Secondary | ICD-10-CM | POA: Diagnosis not present

## 2015-09-27 DIAGNOSIS — Z79899 Other long term (current) drug therapy: Secondary | ICD-10-CM | POA: Diagnosis not present

## 2015-09-27 DIAGNOSIS — E559 Vitamin D deficiency, unspecified: Secondary | ICD-10-CM | POA: Diagnosis not present

## 2015-09-27 DIAGNOSIS — I10 Essential (primary) hypertension: Secondary | ICD-10-CM | POA: Diagnosis not present

## 2015-09-27 DIAGNOSIS — R809 Proteinuria, unspecified: Secondary | ICD-10-CM | POA: Diagnosis not present

## 2015-09-27 DIAGNOSIS — D509 Iron deficiency anemia, unspecified: Secondary | ICD-10-CM | POA: Diagnosis not present

## 2015-09-27 DIAGNOSIS — N183 Chronic kidney disease, stage 3 (moderate): Secondary | ICD-10-CM | POA: Diagnosis not present

## 2015-10-02 DIAGNOSIS — Z1389 Encounter for screening for other disorder: Secondary | ICD-10-CM | POA: Diagnosis not present

## 2015-10-02 DIAGNOSIS — Z0000 Encounter for general adult medical examination without abnormal findings: Secondary | ICD-10-CM | POA: Diagnosis not present

## 2015-10-02 DIAGNOSIS — D638 Anemia in other chronic diseases classified elsewhere: Secondary | ICD-10-CM | POA: Diagnosis not present

## 2015-10-02 DIAGNOSIS — N184 Chronic kidney disease, stage 4 (severe): Secondary | ICD-10-CM | POA: Diagnosis not present

## 2015-10-02 DIAGNOSIS — Z6838 Body mass index (BMI) 38.0-38.9, adult: Secondary | ICD-10-CM | POA: Diagnosis not present

## 2015-10-02 DIAGNOSIS — R809 Proteinuria, unspecified: Secondary | ICD-10-CM | POA: Diagnosis not present

## 2015-11-01 DIAGNOSIS — I10 Essential (primary) hypertension: Secondary | ICD-10-CM | POA: Diagnosis not present

## 2015-11-01 DIAGNOSIS — R5383 Other fatigue: Secondary | ICD-10-CM | POA: Diagnosis not present

## 2015-11-01 DIAGNOSIS — N400 Enlarged prostate without lower urinary tract symptoms: Secondary | ICD-10-CM | POA: Diagnosis not present

## 2015-11-01 DIAGNOSIS — N184 Chronic kidney disease, stage 4 (severe): Secondary | ICD-10-CM | POA: Diagnosis not present

## 2015-11-01 DIAGNOSIS — M170 Bilateral primary osteoarthritis of knee: Secondary | ICD-10-CM | POA: Diagnosis not present

## 2015-11-01 DIAGNOSIS — Z6839 Body mass index (BMI) 39.0-39.9, adult: Secondary | ICD-10-CM | POA: Diagnosis not present

## 2015-11-01 DIAGNOSIS — E1129 Type 2 diabetes mellitus with other diabetic kidney complication: Secondary | ICD-10-CM | POA: Diagnosis not present

## 2015-11-04 ENCOUNTER — Other Ambulatory Visit (HOSPITAL_COMMUNITY): Payer: Self-pay | Admitting: Family Medicine

## 2015-11-04 ENCOUNTER — Ambulatory Visit (HOSPITAL_COMMUNITY)
Admission: RE | Admit: 2015-11-04 | Discharge: 2015-11-04 | Disposition: A | Discharge status: Home / Self Care | Payer: Medicare Other | Source: Ambulatory Visit | Attending: Family Medicine | Admitting: Family Medicine

## 2015-11-04 DIAGNOSIS — M170 Bilateral primary osteoarthritis of knee: Secondary | ICD-10-CM | POA: Diagnosis not present

## 2015-11-04 DIAGNOSIS — M545 Low back pain, unspecified: Secondary | ICD-10-CM

## 2015-11-04 DIAGNOSIS — R5383 Other fatigue: Secondary | ICD-10-CM | POA: Diagnosis not present

## 2015-11-04 DIAGNOSIS — R0600 Dyspnea, unspecified: Secondary | ICD-10-CM | POA: Insufficient documentation

## 2015-11-04 DIAGNOSIS — M5136 Other intervertebral disc degeneration, lumbar region: Secondary | ICD-10-CM | POA: Insufficient documentation

## 2015-11-11 ENCOUNTER — Encounter: Payer: Self-pay | Admitting: Internal Medicine

## 2015-11-19 ENCOUNTER — Encounter (HOSPITAL_COMMUNITY): Payer: Self-pay | Admitting: Emergency Medicine

## 2015-11-19 ENCOUNTER — Emergency Department (HOSPITAL_COMMUNITY)
Admission: EM | Admit: 2015-11-19 | Discharge: 2015-11-19 | Disposition: A | Discharge status: Home / Self Care | Payer: Medicare Other | Attending: Emergency Medicine | Admitting: Emergency Medicine

## 2015-11-19 ENCOUNTER — Emergency Department (HOSPITAL_COMMUNITY): Payer: Medicare Other

## 2015-11-19 DIAGNOSIS — Z794 Long term (current) use of insulin: Secondary | ICD-10-CM | POA: Diagnosis not present

## 2015-11-19 DIAGNOSIS — Z85828 Personal history of other malignant neoplasm of skin: Secondary | ICD-10-CM | POA: Diagnosis not present

## 2015-11-19 DIAGNOSIS — E119 Type 2 diabetes mellitus without complications: Secondary | ICD-10-CM | POA: Diagnosis not present

## 2015-11-19 DIAGNOSIS — R079 Chest pain, unspecified: Secondary | ICD-10-CM | POA: Diagnosis not present

## 2015-11-19 DIAGNOSIS — I110 Hypertensive heart disease with heart failure: Secondary | ICD-10-CM | POA: Diagnosis not present

## 2015-11-19 DIAGNOSIS — Z79899 Other long term (current) drug therapy: Secondary | ICD-10-CM | POA: Insufficient documentation

## 2015-11-19 DIAGNOSIS — E785 Hyperlipidemia, unspecified: Secondary | ICD-10-CM | POA: Insufficient documentation

## 2015-11-19 DIAGNOSIS — I509 Heart failure, unspecified: Secondary | ICD-10-CM | POA: Insufficient documentation

## 2015-11-19 DIAGNOSIS — R110 Nausea: Secondary | ICD-10-CM | POA: Diagnosis not present

## 2015-11-19 DIAGNOSIS — Z8673 Personal history of transient ischemic attack (TIA), and cerebral infarction without residual deficits: Secondary | ICD-10-CM | POA: Diagnosis not present

## 2015-11-19 DIAGNOSIS — R531 Weakness: Secondary | ICD-10-CM | POA: Diagnosis present

## 2015-11-19 HISTORY — DX: Unspecified malignant neoplasm of skin, unspecified: C44.90

## 2015-11-19 HISTORY — DX: Unspecified macular degeneration: H35.30

## 2015-11-19 LAB — COMPREHENSIVE METABOLIC PANEL
ALT: 20 U/L (ref 17–63)
AST: 19 U/L (ref 15–41)
Albumin: 3.7 g/dL (ref 3.5–5.0)
Alkaline Phosphatase: 50 U/L (ref 38–126)
Anion gap: 7 (ref 5–15)
BILIRUBIN TOTAL: 0.4 mg/dL (ref 0.3–1.2)
BUN: 87 mg/dL — AB (ref 6–20)
CO2: 24 mmol/L (ref 22–32)
CREATININE: 4.31 mg/dL — AB (ref 0.61–1.24)
Calcium: 9.1 mg/dL (ref 8.9–10.3)
Chloride: 109 mmol/L (ref 101–111)
GFR calc Af Amer: 13 mL/min — ABNORMAL LOW (ref >60)
GFR, EST NON AFRICAN AMERICAN: 12 mL/min — AB (ref >60)
Glucose, Bld: 93 mg/dL (ref 65–99)
Potassium: 4.4 mmol/L (ref 3.5–5.1)
Sodium: 140 mmol/L (ref 135–145)
TOTAL PROTEIN: 6.8 g/dL (ref 6.5–8.1)

## 2015-11-19 LAB — CBC WITH DIFFERENTIAL/PLATELET
BASOS ABS: 0 10*3/uL (ref 0.0–0.1)
Basophils Relative: 0 %
Eosinophils Absolute: 0.3 10*3/uL (ref 0.0–0.7)
Eosinophils Relative: 2 %
HEMATOCRIT: 36.4 % — AB (ref 39.0–52.0)
HEMOGLOBIN: 11.3 g/dL — AB (ref 13.0–17.0)
LYMPHS ABS: 1.9 10*3/uL (ref 0.7–4.0)
Lymphocytes Relative: 15 %
MCH: 27.9 pg (ref 26.0–34.0)
MCHC: 31 g/dL (ref 30.0–36.0)
MCV: 89.9 fL (ref 78.0–100.0)
MONO ABS: 0.8 10*3/uL (ref 0.1–1.0)
Monocytes Relative: 7 %
Neutro Abs: 9.2 10*3/uL — ABNORMAL HIGH (ref 1.7–7.7)
Neutrophils Relative %: 76 %
Platelets: 318 10*3/uL (ref 150–400)
RBC: 4.05 MIL/uL — AB (ref 4.22–5.81)
RDW: 14.9 % (ref 11.5–15.5)
WBC: 12.2 10*3/uL — ABNORMAL HIGH (ref 4.0–10.5)

## 2015-11-19 LAB — TROPONIN I: Troponin I: <0.03 ng/mL (ref <0.031)

## 2015-11-19 MED ORDER — ONDANSETRON 4 MG PO TBDP: ORAL_TABLET | ORAL | Status: AC

## 2015-11-19 MED ORDER — PANTOPRAZOLE SODIUM 40 MG IV SOLR
40.0000 mg | Freq: Once | INTRAVENOUS | Status: AC
  Administered 2015-11-19: 40 mg via INTRAVENOUS
  Filled 2015-11-19: qty 40

## 2015-11-19 MED ORDER — ONDANSETRON HCL 4 MG/2ML IJ SOLN
4.0000 mg | Freq: Once | INTRAMUSCULAR | Status: AC
  Administered 2015-11-19: 4 mg via INTRAVENOUS
  Filled 2015-11-19: qty 2

## 2015-11-19 NOTE — ED Provider Notes (Signed)
CSN: [id number]     Arrival date & time 11/19/15  1628 History   First MD Initiated Contact with Patient 11/19/15 1652     Chief Complaint  Patient presents with  . Weakness  . Chest Pain     (Consider location/radiation/quality/duration/timing/severity/associated sxs/prior Treatment) Patient is a 82 y.o. male presenting with weakness. The history is provided by the patient (Patient complains of epigastric discomfort with nausea for a few days. He states he eats something and the nausea goes away in a couple hours later comes back).  Weakness This is a new problem. The current episode started more than 2 days ago. The problem occurs hourly. The problem has not changed since onset.Pertinent negatives include no chest pain, no abdominal pain and no headaches. Nothing aggravates the symptoms.    Past Medical History  Diagnosis Date  . GI bleed     2006, TCS/TI showed small polyp. EGD showed erosive antral gastritis with two polyp, one oozing and tx. HP neg. SB capsule shoed few jejunal ulcers with bleeding (naproxen and ASA)  . GI bleed     02/2010, EGD->pancreatitc rest, fundal gland polyps,  no bleeding. TCS->blood-tinged colonic effluent throughout the colon without bleeding lesion found, nl TI. SB capsule->SB erosions, nonbleeding, incomplete study. Patient on naproxyn.   . DM (diabetes mellitus) ([place])   . Pneumonia   . Hyperlipidemia   . HTN (hypertension)   . Stroke ([place])   . GERD (gastroesophageal reflux disease)   . Poor vision     left eye  . Sleep apnea   . Obesity   . Ischemic colitis, enteritis, or enterocolitis ([place])     flex sig 01/2010  . Gastric AVM 10/24/10    GI bleed EGD w/ bicap and hemoclip placement, 2 AVMs, presented w/bright red rectal bleeding  . GI bleed 10/27/10    ileocolonoscopy/GIVENS capsule study by Dr Rourk->bloody colonic effluent throughout colon but no lesion identified, TA @ hepatic flexure, fresh blood in dital SB on capsule. Nuc med RBC study  negative  . Tubular adenoma of colon 10/27/10    on colonoscopy Dr Gala Romney  . Seborrheic keratoses      back and chest  . Bundle branch block, right   . Hypoxia     requiring oxygen while in hospital  . Nephrolithiasis   . Chronic renal insufficiency   . Kidney disease   . CHF (congestive heart failure) ([place])   . Macular degeneration   . Skin cancer    Past Surgical History  Procedure Laterality Date  . Back surgery    . Transurethral resection of prostate    . Cataract extraction      bilateral  . Appendectomy    . Cholecystectomy    . Elbow surgery    . Esophagogastroduodenoscopy  10/23/10    Dr. Tora Kindred arteriovenous malformations,GI bleed- capsule  . Colonoscopy  10/27/10    Dr. Chauncy Lean adenoma, normal rectum bloody colonic effluent throughout the colon with out bleeding lesion seen.   . [place] capsule study N/A 02/14/2014    Distal SB ulcers with active bleeding noted.  . Esophagogastroduodenoscopy N/A 02/13/2014    [id number] gland polyps. Pancreatic rest. 2 tiny areas of erosions/ulceration-not likely significant-s/p bx  . Colonoscopy N/A 02/16/2014    [id number] appearing TI. Moderate sized internal hemorrhoids/[facility]/[place] colon are redundant/TWO COLON POLYPS REMOVED  . Nm myoview ltd  2012    Negative for Ischemia  . Polypectomy    . Doppler echocardiography    .  Stress dipyridamole myocardial perfusion    . Cardiovascular stress test    . Cardiac catheterization     Family History  Problem Relation Age of Onset  . Aneurysm Daughter     brain, deceased age 13  . GI problems Brother     gi bleed  . Cancer Sister     unknown type  . Colon cancer Neg Hx   . Liver disease Neg Hx    Social History  Substance Use Topics  . Smoking status: Never Smoker   . Smokeless tobacco: Never Used  . Alcohol Use: No    Review of Systems  Constitutional: Negative for appetite change and fatigue.  HENT: Negative for congestion, ear discharge and sinus pressure.    Eyes: Negative for discharge.  Respiratory: Negative for cough.   Cardiovascular: Negative for chest pain.  Gastrointestinal: Positive for nausea. Negative for abdominal pain and diarrhea.  Genitourinary: Negative for frequency and hematuria.  Musculoskeletal: Negative for back pain.  Skin: Negative for rash.  Neurological: Negative for seizures and headaches.  Psychiatric/Behavioral: Negative for hallucinations.      Allergies  Asa; Latex; Propoxyphene; Sulfa antibiotics; and Betadine  Home Medications   Prior to Admission medications   Medication Sig Start Date End Date Taking? Authorizing Provider  acetaminophen (NON-ASPIRIN) 325 MG tablet Take 650 mg by mouth every 6 (six) hours as needed for moderate pain.   Yes Historical Provider, MD  amLODipine (NORVASC) 5 MG tablet Take 7.5 mg by mouth every morning.    Yes Historical Provider, MD  Cholecalciferol (VITAMIN D) 2000 UNITS CAPS Take 1 capsule by mouth every evening.    Yes Historical Provider, MD  Choline Fenofibrate (TRILIPIX) 135 MG capsule Take 135 mg by mouth at bedtime.    Yes Historical Provider, MD  clotrimazole-betamethasone (LOTRISONE) cream Apply 1 application topically daily as needed (for skin irritation).  11/04/15  Yes Historical Provider, MD  ferrous sulfate 325 (65 FE) MG tablet Take 325 mg by mouth every evening.    Yes Historical Provider, MD  finasteride (PROSCAR) 5 MG tablet Take 5 mg by mouth every morning.  10/04/14  Yes Historical Provider, MD  furosemide (LASIX) 20 MG tablet Take 40 mg by mouth 2 (two) times daily.    Yes Historical Provider, MD  insulin lispro protamine-lispro (HUMALOG 75/25 MIX) (75-25) 100 UNIT/ML SUSP injection Inject 60 Units into the skin 2 (two) times daily with a meal. 01/29/14  Yes Kathie Dike, MD  losartan (COZAAR) 25 MG tablet Take 25 mg by mouth every morning.    Yes Historical Provider, MD  nebivolol (BYSTOLIC) 10 MG tablet Take 10 mg by mouth every morning.    Yes Historical  Provider, MD  pantoprazole (PROTONIX) 40 MG tablet Take 40 mg by mouth 2 (two) times daily.  02/19/14  Yes Samuella Cota, MD  potassium chloride (KLOR-CON M10) 10 MEQ tablet Take 1 tablet (10 mEq total) by mouth daily. <please make appointment for refills> Patient taking differently: Take 10 mEq by mouth every evening. <please make appointment for refills> 05/09/14  Yes Troy Sine, MD  sodium bicarbonate 650 MG tablet Take 650 mg by mouth 2 (two) times daily.   Yes Historical Provider, MD  tamsulosin (FLOMAX) 0.4 MG CAPS capsule Take 0.4 mg by mouth every evening.    Yes Historical Provider, MD  terazosin (HYTRIN) 10 MG capsule Take 10 mg by mouth at bedtime.   Yes Historical Provider, MD  traMADol (ULTRAM) 50 MG tablet Take  50 mg by mouth 4 (four) times daily as needed (for arthritis pain).  11/04/15  Yes Historical Provider, MD  albuterol (PROVENTIL) (2.5 MG/3ML) 0.083% nebulizer solution Take 3 mLs (2.5 mg total) by nebulization every 2 (two) hours as needed for wheezing or shortness of breath. 12/29/14   Kathie Dike, MD  ondansetron (ZOFRAN ODT) 4 MG disintegrating tablet 4mg  ODT q4 hours prn nausea/vomit 11/19/15   Milton Ferguson, MD   BP 128/88 mmHg  Pulse 64  Resp 15  SpO2 93% Physical Exam  Constitutional: He is oriented to person, place, and time. He appears well-developed.  HENT:  Head: Normocephalic.  Eyes: Conjunctivae and EOM are normal. No scleral icterus.  Neck: Neck supple. No thyromegaly present.  Cardiovascular: Normal rate and regular rhythm.  Exam reveals no gallop and no friction rub.   No murmur heard. Pulmonary/Chest: No stridor. He has no wheezes. He has no rales. He exhibits no tenderness.  Abdominal: He exhibits no distension. There is no tenderness. There is no rebound.  Musculoskeletal: Normal range of motion. He exhibits no edema.  Lymphadenopathy:    He has no cervical adenopathy.  Neurological: He is oriented to person, place, and time. He exhibits  normal muscle tone. Coordination normal.  Skin: No rash noted. No erythema.  Psychiatric: He has a normal mood and affect. His behavior is normal.    ED Course  Procedures (including critical care time) Labs Review Labs Reviewed  CBC WITH DIFFERENTIAL/PLATELET - Abnormal; Notable for the following:    WBC 12.2 (*)    RBC 4.05 (*)    Hemoglobin 11.3 (*)    HCT 36.4 (*)    Neutro Abs 9.2 (*)    All other components within normal limits  COMPREHENSIVE METABOLIC PANEL - Abnormal; Notable for the following:    BUN 87 (*)    Creatinine, Ser 4.31 (*)    GFR calc non Af Amer 12 (*)    GFR calc Af Amer 13 (*)    All other components within normal limits  TROPONIN I    Imaging Review Dg Chest Portable 1 View  11/19/2015  CLINICAL DATA:  Chest pain EXAM: PORTABLE CHEST 1 VIEW COMPARISON:  12/26/2014 chest radiograph. FINDINGS: Stable cardiomediastinal silhouette with normal heart size. No pneumothorax. No pleural effusion. Lungs appear clear, with no acute consolidative airspace disease and no pulmonary edema. IMPRESSION: No active disease. Electronically Signed   By: Ilona Sorrel M.D.   On: 11/19/2015 17:10   I have personally reviewed and evaluated these images and lab results as part of my medical decision-making.   EKG Interpretation   Date/Time:  Tuesday Nov 19 2015 16:46:02 EDT Ventricular Rate:  62 PR Interval:  170 QRS Duration: 162 QT Interval:  418 QTC Calculation: 424 R Axis:   80 Text Interpretation:  Normal sinus rhythm Right bundle branch block  Abnormal ECG Confirmed by Axten Pascucci  MD, Isaac Dubie ([id number]) on 11/19/2015 4:58:21  PM      MDM   Final diagnoses:  Nausea    Patient with nausea that is relieved by eating. Patient has had gallbladder removed. Labs are unremarkable except for mildly elevated WBC. Patient's symptoms improved with nausea medicine. He is instructed to increase his Protonix to twice a day and start Zofran and follow-up with his GI doctor and his  family doctor   Milton Ferguson, MD 11/19/15 [phone]

## 2015-11-19 NOTE — Discharge Instructions (Signed)
Follow-up with Dr. Oneida Alar next week. Follow-up with your family doctor this week for recheck. Increase her Protonix to twice a day

## 2015-11-19 NOTE — ED Notes (Signed)
Patient presents with complaint of weakness x 1 week. During triage patient admits to mid sternal chest pain "off and on for about a week." Also complaining of nausea and shortness of breath.

## 2015-11-26 DIAGNOSIS — E1140 Type 2 diabetes mellitus with diabetic neuropathy, unspecified: Secondary | ICD-10-CM | POA: Diagnosis not present

## 2015-12-02 DIAGNOSIS — E559 Vitamin D deficiency, unspecified: Secondary | ICD-10-CM | POA: Diagnosis not present

## 2015-12-02 DIAGNOSIS — R809 Proteinuria, unspecified: Secondary | ICD-10-CM | POA: Diagnosis not present

## 2015-12-02 DIAGNOSIS — I10 Essential (primary) hypertension: Secondary | ICD-10-CM | POA: Diagnosis not present

## 2015-12-02 DIAGNOSIS — N184 Chronic kidney disease, stage 4 (severe): Secondary | ICD-10-CM | POA: Diagnosis not present

## 2015-12-02 DIAGNOSIS — D509 Iron deficiency anemia, unspecified: Secondary | ICD-10-CM | POA: Diagnosis not present

## 2015-12-02 DIAGNOSIS — Z79899 Other long term (current) drug therapy: Secondary | ICD-10-CM | POA: Diagnosis not present

## 2015-12-04 DIAGNOSIS — R110 Nausea: Secondary | ICD-10-CM | POA: Diagnosis not present

## 2015-12-04 DIAGNOSIS — R809 Proteinuria, unspecified: Secondary | ICD-10-CM | POA: Diagnosis not present

## 2015-12-04 DIAGNOSIS — E669 Obesity, unspecified: Secondary | ICD-10-CM | POA: Diagnosis not present

## 2015-12-04 DIAGNOSIS — Z6838 Body mass index (BMI) 38.0-38.9, adult: Secondary | ICD-10-CM | POA: Diagnosis not present

## 2015-12-04 DIAGNOSIS — N2581 Secondary hyperparathyroidism of renal origin: Secondary | ICD-10-CM | POA: Diagnosis not present

## 2015-12-04 DIAGNOSIS — N185 Chronic kidney disease, stage 5: Secondary | ICD-10-CM | POA: Diagnosis not present

## 2015-12-04 DIAGNOSIS — E1165 Type 2 diabetes mellitus with hyperglycemia: Secondary | ICD-10-CM | POA: Diagnosis not present

## 2015-12-04 DIAGNOSIS — Z1389 Encounter for screening for other disorder: Secondary | ICD-10-CM | POA: Diagnosis not present

## 2015-12-04 DIAGNOSIS — K219 Gastro-esophageal reflux disease without esophagitis: Secondary | ICD-10-CM | POA: Diagnosis not present

## 2015-12-04 DIAGNOSIS — D638 Anemia in other chronic diseases classified elsewhere: Secondary | ICD-10-CM | POA: Diagnosis not present

## 2015-12-04 DIAGNOSIS — I10 Essential (primary) hypertension: Secondary | ICD-10-CM | POA: Diagnosis not present

## 2015-12-26 ENCOUNTER — Telehealth: Payer: Self-pay

## 2015-12-26 NOTE — Telephone Encounter (Signed)
Pt's daughter is calling because they received a letter to set up TCS/EGD but he is stage 5 kidney failure and she is wanting to know if he really need to come in to be seen. Please advise

## 2015-12-26 NOTE — Telephone Encounter (Signed)
Given this age and co-morbidities, I'm not sure he needs any further  GI evalution.  For now, I do recommend he return for a follow-up appointment this Fall for a GI recheck

## 2015-12-27 NOTE — Telephone Encounter (Signed)
Louis(his daughter) will call us in Aug. To make follow up with [facility] only in Sept

## 2016-01-02 ENCOUNTER — Other Ambulatory Visit: Payer: Self-pay | Admitting: Cardiovascular Disease

## 2016-01-16 DIAGNOSIS — E1165 Type 2 diabetes mellitus with hyperglycemia: Secondary | ICD-10-CM | POA: Diagnosis not present

## 2016-01-16 DIAGNOSIS — I10 Essential (primary) hypertension: Secondary | ICD-10-CM | POA: Diagnosis not present

## 2016-01-16 DIAGNOSIS — E7800 Pure hypercholesterolemia, unspecified: Secondary | ICD-10-CM | POA: Diagnosis not present

## 2016-01-16 DIAGNOSIS — R809 Proteinuria, unspecified: Secondary | ICD-10-CM | POA: Diagnosis not present

## 2016-02-03 DIAGNOSIS — Z79899 Other long term (current) drug therapy: Secondary | ICD-10-CM | POA: Diagnosis not present

## 2016-02-03 DIAGNOSIS — D509 Iron deficiency anemia, unspecified: Secondary | ICD-10-CM | POA: Diagnosis not present

## 2016-02-03 DIAGNOSIS — E559 Vitamin D deficiency, unspecified: Secondary | ICD-10-CM | POA: Diagnosis not present

## 2016-02-03 DIAGNOSIS — N183 Chronic kidney disease, stage 3 (moderate): Secondary | ICD-10-CM | POA: Diagnosis not present

## 2016-02-03 DIAGNOSIS — I10 Essential (primary) hypertension: Secondary | ICD-10-CM | POA: Diagnosis not present

## 2016-02-03 DIAGNOSIS — R809 Proteinuria, unspecified: Secondary | ICD-10-CM | POA: Diagnosis not present

## 2016-02-05 DIAGNOSIS — D509 Iron deficiency anemia, unspecified: Secondary | ICD-10-CM | POA: Diagnosis not present

## 2016-02-05 DIAGNOSIS — N2581 Secondary hyperparathyroidism of renal origin: Secondary | ICD-10-CM | POA: Diagnosis not present

## 2016-02-05 DIAGNOSIS — E559 Vitamin D deficiency, unspecified: Secondary | ICD-10-CM | POA: Diagnosis not present

## 2016-02-05 DIAGNOSIS — I10 Essential (primary) hypertension: Secondary | ICD-10-CM | POA: Diagnosis not present

## 2016-02-05 DIAGNOSIS — N185 Chronic kidney disease, stage 5: Secondary | ICD-10-CM | POA: Diagnosis not present

## 2016-02-11 DIAGNOSIS — E1140 Type 2 diabetes mellitus with diabetic neuropathy, unspecified: Secondary | ICD-10-CM | POA: Diagnosis not present

## 2016-04-10 DIAGNOSIS — I10 Essential (primary) hypertension: Secondary | ICD-10-CM | POA: Diagnosis not present

## 2016-04-10 DIAGNOSIS — N183 Chronic kidney disease, stage 3 (moderate): Secondary | ICD-10-CM | POA: Diagnosis not present

## 2016-04-10 DIAGNOSIS — D509 Iron deficiency anemia, unspecified: Secondary | ICD-10-CM | POA: Diagnosis not present

## 2016-04-10 DIAGNOSIS — R809 Proteinuria, unspecified: Secondary | ICD-10-CM | POA: Diagnosis not present

## 2016-04-10 DIAGNOSIS — Z79899 Other long term (current) drug therapy: Secondary | ICD-10-CM | POA: Diagnosis not present

## 2016-04-10 DIAGNOSIS — E559 Vitamin D deficiency, unspecified: Secondary | ICD-10-CM | POA: Diagnosis not present

## 2016-04-15 DIAGNOSIS — I10 Essential (primary) hypertension: Secondary | ICD-10-CM | POA: Diagnosis not present

## 2016-04-15 DIAGNOSIS — N2581 Secondary hyperparathyroidism of renal origin: Secondary | ICD-10-CM | POA: Diagnosis not present

## 2016-04-15 DIAGNOSIS — D638 Anemia in other chronic diseases classified elsewhere: Secondary | ICD-10-CM | POA: Diagnosis not present

## 2016-04-15 DIAGNOSIS — N185 Chronic kidney disease, stage 5: Secondary | ICD-10-CM | POA: Diagnosis not present

## 2016-04-15 DIAGNOSIS — Z23 Encounter for immunization: Secondary | ICD-10-CM | POA: Diagnosis not present

## 2016-04-21 DIAGNOSIS — E1140 Type 2 diabetes mellitus with diabetic neuropathy, unspecified: Secondary | ICD-10-CM | POA: Diagnosis not present

## 2016-05-29 DIAGNOSIS — Z79899 Other long term (current) drug therapy: Secondary | ICD-10-CM | POA: Diagnosis not present

## 2016-05-29 DIAGNOSIS — I10 Essential (primary) hypertension: Secondary | ICD-10-CM | POA: Diagnosis not present

## 2016-05-29 DIAGNOSIS — D509 Iron deficiency anemia, unspecified: Secondary | ICD-10-CM | POA: Diagnosis not present

## 2016-05-29 DIAGNOSIS — E559 Vitamin D deficiency, unspecified: Secondary | ICD-10-CM | POA: Diagnosis not present

## 2016-05-29 DIAGNOSIS — N184 Chronic kidney disease, stage 4 (severe): Secondary | ICD-10-CM | POA: Diagnosis not present

## 2016-05-29 DIAGNOSIS — R809 Proteinuria, unspecified: Secondary | ICD-10-CM | POA: Diagnosis not present

## 2016-06-03 DIAGNOSIS — N2581 Secondary hyperparathyroidism of renal origin: Secondary | ICD-10-CM | POA: Diagnosis not present

## 2016-06-03 DIAGNOSIS — R809 Proteinuria, unspecified: Secondary | ICD-10-CM | POA: Diagnosis not present

## 2016-06-03 DIAGNOSIS — N185 Chronic kidney disease, stage 5: Secondary | ICD-10-CM | POA: Diagnosis not present

## 2016-06-03 DIAGNOSIS — D638 Anemia in other chronic diseases classified elsewhere: Secondary | ICD-10-CM | POA: Diagnosis not present

## 2016-07-07 DIAGNOSIS — E1140 Type 2 diabetes mellitus with diabetic neuropathy, unspecified: Secondary | ICD-10-CM | POA: Diagnosis not present

## 2016-07-20 DIAGNOSIS — I10 Essential (primary) hypertension: Secondary | ICD-10-CM | POA: Diagnosis not present

## 2016-07-20 DIAGNOSIS — E7800 Pure hypercholesterolemia, unspecified: Secondary | ICD-10-CM | POA: Diagnosis not present

## 2016-07-20 DIAGNOSIS — R809 Proteinuria, unspecified: Secondary | ICD-10-CM | POA: Diagnosis not present

## 2016-07-20 DIAGNOSIS — E1165 Type 2 diabetes mellitus with hyperglycemia: Secondary | ICD-10-CM | POA: Diagnosis not present

## 2016-08-18 DIAGNOSIS — N183 Chronic kidney disease, stage 3 (moderate): Secondary | ICD-10-CM | POA: Diagnosis not present

## 2016-08-18 DIAGNOSIS — D509 Iron deficiency anemia, unspecified: Secondary | ICD-10-CM | POA: Diagnosis not present

## 2016-08-18 DIAGNOSIS — Z79899 Other long term (current) drug therapy: Secondary | ICD-10-CM | POA: Diagnosis not present

## 2016-08-18 DIAGNOSIS — I10 Essential (primary) hypertension: Secondary | ICD-10-CM | POA: Diagnosis not present

## 2016-08-18 DIAGNOSIS — R809 Proteinuria, unspecified: Secondary | ICD-10-CM | POA: Diagnosis not present

## 2016-08-18 DIAGNOSIS — E559 Vitamin D deficiency, unspecified: Secondary | ICD-10-CM | POA: Diagnosis not present

## 2016-08-19 DIAGNOSIS — D638 Anemia in other chronic diseases classified elsewhere: Secondary | ICD-10-CM | POA: Diagnosis not present

## 2016-08-19 DIAGNOSIS — I10 Essential (primary) hypertension: Secondary | ICD-10-CM | POA: Diagnosis not present

## 2016-08-19 DIAGNOSIS — N185 Chronic kidney disease, stage 5: Secondary | ICD-10-CM | POA: Diagnosis not present

## 2016-08-19 DIAGNOSIS — N2581 Secondary hyperparathyroidism of renal origin: Secondary | ICD-10-CM | POA: Diagnosis not present

## 2016-09-15 DIAGNOSIS — E1140 Type 2 diabetes mellitus with diabetic neuropathy, unspecified: Secondary | ICD-10-CM | POA: Diagnosis not present

## 2016-10-16 DIAGNOSIS — E559 Vitamin D deficiency, unspecified: Secondary | ICD-10-CM | POA: Diagnosis not present

## 2016-10-16 DIAGNOSIS — N183 Chronic kidney disease, stage 3 (moderate): Secondary | ICD-10-CM | POA: Diagnosis not present

## 2016-10-16 DIAGNOSIS — Z79899 Other long term (current) drug therapy: Secondary | ICD-10-CM | POA: Diagnosis not present

## 2016-10-16 DIAGNOSIS — R809 Proteinuria, unspecified: Secondary | ICD-10-CM | POA: Diagnosis not present

## 2016-10-16 DIAGNOSIS — D509 Iron deficiency anemia, unspecified: Secondary | ICD-10-CM | POA: Diagnosis not present

## 2016-10-16 DIAGNOSIS — I10 Essential (primary) hypertension: Secondary | ICD-10-CM | POA: Diagnosis not present

## 2016-10-21 DIAGNOSIS — N185 Chronic kidney disease, stage 5: Secondary | ICD-10-CM | POA: Diagnosis not present

## 2016-10-21 DIAGNOSIS — E559 Vitamin D deficiency, unspecified: Secondary | ICD-10-CM | POA: Diagnosis not present

## 2016-10-21 DIAGNOSIS — D638 Anemia in other chronic diseases classified elsewhere: Secondary | ICD-10-CM | POA: Diagnosis not present

## 2016-10-21 DIAGNOSIS — R809 Proteinuria, unspecified: Secondary | ICD-10-CM | POA: Diagnosis not present

## 2016-10-21 DIAGNOSIS — N2581 Secondary hyperparathyroidism of renal origin: Secondary | ICD-10-CM | POA: Diagnosis not present

## 2016-10-24 ENCOUNTER — Emergency Department (HOSPITAL_COMMUNITY): Payer: Medicare Other

## 2016-10-24 ENCOUNTER — Inpatient Hospital Stay (HOSPITAL_COMMUNITY)
Admission: EM | Admit: 2016-10-24 | Discharge: 2016-10-28 | DRG: 377 | Disposition: A | Discharge status: Skilled Nursing Facility | Payer: Medicare Other | Attending: Family Medicine | Admitting: Family Medicine

## 2016-10-24 ENCOUNTER — Encounter (HOSPITAL_COMMUNITY): Payer: Self-pay

## 2016-10-24 DIAGNOSIS — Z6839 Body mass index (BMI) 39.0-39.9, adult: Secondary | ICD-10-CM | POA: Diagnosis not present

## 2016-10-24 DIAGNOSIS — Z7982 Long term (current) use of aspirin: Secondary | ICD-10-CM

## 2016-10-24 DIAGNOSIS — Z5189 Encounter for other specified aftercare: Secondary | ICD-10-CM | POA: Diagnosis not present

## 2016-10-24 DIAGNOSIS — H3530 Unspecified macular degeneration: Secondary | ICD-10-CM | POA: Diagnosis present

## 2016-10-24 DIAGNOSIS — K921 Melena: Secondary | ICD-10-CM | POA: Diagnosis present

## 2016-10-24 DIAGNOSIS — R262 Difficulty in walking, not elsewhere classified: Secondary | ICD-10-CM | POA: Diagnosis not present

## 2016-10-24 DIAGNOSIS — N189 Chronic kidney disease, unspecified: Secondary | ICD-10-CM

## 2016-10-24 DIAGNOSIS — R32 Unspecified urinary incontinence: Secondary | ICD-10-CM | POA: Diagnosis present

## 2016-10-24 DIAGNOSIS — E119 Type 2 diabetes mellitus without complications: Secondary | ICD-10-CM

## 2016-10-24 DIAGNOSIS — E559 Vitamin D deficiency, unspecified: Secondary | ICD-10-CM | POA: Diagnosis not present

## 2016-10-24 DIAGNOSIS — I132 Hypertensive heart and chronic kidney disease with heart failure and with stage 5 chronic kidney disease, or end stage renal disease: Secondary | ICD-10-CM | POA: Diagnosis present

## 2016-10-24 DIAGNOSIS — Z8673 Personal history of transient ischemic attack (TIA), and cerebral infarction without residual deficits: Secondary | ICD-10-CM

## 2016-10-24 DIAGNOSIS — E669 Obesity, unspecified: Secondary | ICD-10-CM | POA: Diagnosis present

## 2016-10-24 DIAGNOSIS — L821 Other seborrheic keratosis: Secondary | ICD-10-CM | POA: Diagnosis present

## 2016-10-24 DIAGNOSIS — Z882 Allergy status to sulfonamides status: Secondary | ICD-10-CM

## 2016-10-24 DIAGNOSIS — D649 Anemia, unspecified: Secondary | ICD-10-CM | POA: Diagnosis present

## 2016-10-24 DIAGNOSIS — Q2730 Arteriovenous malformation, site unspecified: Secondary | ICD-10-CM | POA: Diagnosis not present

## 2016-10-24 DIAGNOSIS — N2581 Secondary hyperparathyroidism of renal origin: Secondary | ICD-10-CM | POA: Diagnosis present

## 2016-10-24 DIAGNOSIS — Z8711 Personal history of peptic ulcer disease: Secondary | ICD-10-CM

## 2016-10-24 DIAGNOSIS — Z794 Long term (current) use of insulin: Secondary | ICD-10-CM

## 2016-10-24 DIAGNOSIS — I509 Heart failure, unspecified: Secondary | ICD-10-CM | POA: Diagnosis not present

## 2016-10-24 DIAGNOSIS — Z809 Family history of malignant neoplasm, unspecified: Secondary | ICD-10-CM

## 2016-10-24 DIAGNOSIS — I10 Essential (primary) hypertension: Secondary | ICD-10-CM | POA: Diagnosis not present

## 2016-10-24 DIAGNOSIS — N400 Enlarged prostate without lower urinary tract symptoms: Secondary | ICD-10-CM | POA: Diagnosis not present

## 2016-10-24 DIAGNOSIS — N184 Chronic kidney disease, stage 4 (severe): Secondary | ICD-10-CM | POA: Diagnosis present

## 2016-10-24 DIAGNOSIS — E1122 Type 2 diabetes mellitus with diabetic chronic kidney disease: Secondary | ICD-10-CM | POA: Diagnosis present

## 2016-10-24 DIAGNOSIS — R279 Unspecified lack of coordination: Secondary | ICD-10-CM | POA: Diagnosis not present

## 2016-10-24 DIAGNOSIS — I5032 Chronic diastolic (congestive) heart failure: Secondary | ICD-10-CM | POA: Diagnosis present

## 2016-10-24 DIAGNOSIS — E1169 Type 2 diabetes mellitus with other specified complication: Secondary | ICD-10-CM | POA: Diagnosis not present

## 2016-10-24 DIAGNOSIS — E1165 Type 2 diabetes mellitus with hyperglycemia: Secondary | ICD-10-CM | POA: Diagnosis present

## 2016-10-24 DIAGNOSIS — K922 Gastrointestinal hemorrhage, unspecified: Secondary | ICD-10-CM | POA: Diagnosis present

## 2016-10-24 DIAGNOSIS — Z886 Allergy status to analgesic agent status: Secondary | ICD-10-CM

## 2016-10-24 DIAGNOSIS — D62 Acute posthemorrhagic anemia: Secondary | ICD-10-CM | POA: Diagnosis not present

## 2016-10-24 DIAGNOSIS — M899 Disorder of bone, unspecified: Secondary | ICD-10-CM | POA: Diagnosis present

## 2016-10-24 DIAGNOSIS — D72829 Elevated white blood cell count, unspecified: Secondary | ICD-10-CM | POA: Diagnosis not present

## 2016-10-24 DIAGNOSIS — K219 Gastro-esophageal reflux disease without esophagitis: Secondary | ICD-10-CM | POA: Diagnosis present

## 2016-10-24 DIAGNOSIS — Z85828 Personal history of other malignant neoplasm of skin: Secondary | ICD-10-CM

## 2016-10-24 DIAGNOSIS — K2211 Ulcer of esophagus with bleeding: Secondary | ICD-10-CM | POA: Diagnosis not present

## 2016-10-24 DIAGNOSIS — N185 Chronic kidney disease, stage 5: Secondary | ICD-10-CM | POA: Diagnosis present

## 2016-10-24 DIAGNOSIS — H547 Unspecified visual loss: Secondary | ICD-10-CM | POA: Diagnosis present

## 2016-10-24 DIAGNOSIS — Z888 Allergy status to other drugs, medicaments and biological substances status: Secondary | ICD-10-CM

## 2016-10-24 DIAGNOSIS — E861 Hypovolemia: Secondary | ICD-10-CM | POA: Diagnosis present

## 2016-10-24 DIAGNOSIS — E785 Hyperlipidemia, unspecified: Secondary | ICD-10-CM | POA: Diagnosis present

## 2016-10-24 DIAGNOSIS — E876 Hypokalemia: Secondary | ICD-10-CM | POA: Diagnosis not present

## 2016-10-24 DIAGNOSIS — G4730 Sleep apnea, unspecified: Secondary | ICD-10-CM | POA: Diagnosis present

## 2016-10-24 DIAGNOSIS — K640 First degree hemorrhoids: Secondary | ICD-10-CM | POA: Diagnosis present

## 2016-10-24 DIAGNOSIS — N170 Acute kidney failure with tubular necrosis: Secondary | ICD-10-CM | POA: Diagnosis present

## 2016-10-24 DIAGNOSIS — D631 Anemia in chronic kidney disease: Secondary | ICD-10-CM | POA: Diagnosis present

## 2016-10-24 DIAGNOSIS — Z87442 Personal history of urinary calculi: Secondary | ICD-10-CM

## 2016-10-24 DIAGNOSIS — R2689 Other abnormalities of gait and mobility: Secondary | ICD-10-CM | POA: Diagnosis not present

## 2016-10-24 DIAGNOSIS — R41841 Cognitive communication deficit: Secondary | ICD-10-CM | POA: Diagnosis not present

## 2016-10-24 DIAGNOSIS — Z79899 Other long term (current) drug therapy: Secondary | ICD-10-CM

## 2016-10-24 DIAGNOSIS — K625 Hemorrhage of anus and rectum: Secondary | ICD-10-CM

## 2016-10-24 DIAGNOSIS — K31811 Angiodysplasia of stomach and duodenum with bleeding: Secondary | ICD-10-CM | POA: Diagnosis not present

## 2016-10-24 DIAGNOSIS — E138 Other specified diabetes mellitus with unspecified complications: Secondary | ICD-10-CM | POA: Diagnosis not present

## 2016-10-24 DIAGNOSIS — N179 Acute kidney failure, unspecified: Secondary | ICD-10-CM

## 2016-10-24 DIAGNOSIS — Z91040 Latex allergy status: Secondary | ICD-10-CM

## 2016-10-24 DIAGNOSIS — R1312 Dysphagia, oropharyngeal phase: Secondary | ICD-10-CM | POA: Diagnosis not present

## 2016-10-24 DIAGNOSIS — Z9049 Acquired absence of other specified parts of digestive tract: Secondary | ICD-10-CM

## 2016-10-24 DIAGNOSIS — Z86010 Personal history of colonic polyps: Secondary | ICD-10-CM

## 2016-10-24 DIAGNOSIS — S299XXA Unspecified injury of thorax, initial encounter: Secondary | ICD-10-CM | POA: Diagnosis not present

## 2016-10-24 DIAGNOSIS — I120 Hypertensive chronic kidney disease with stage 5 chronic kidney disease or end stage renal disease: Secondary | ICD-10-CM | POA: Diagnosis not present

## 2016-10-24 DIAGNOSIS — M6281 Muscle weakness (generalized): Secondary | ICD-10-CM | POA: Diagnosis not present

## 2016-10-24 DIAGNOSIS — Z9115 Patient's noncompliance with renal dialysis: Secondary | ICD-10-CM

## 2016-10-24 DIAGNOSIS — R531 Weakness: Secondary | ICD-10-CM | POA: Diagnosis not present

## 2016-10-24 DIAGNOSIS — Z7401 Bed confinement status: Secondary | ICD-10-CM | POA: Diagnosis not present

## 2016-10-24 DIAGNOSIS — R404 Transient alteration of awareness: Secondary | ICD-10-CM | POA: Diagnosis not present

## 2016-10-24 LAB — TROPONIN I

## 2016-10-24 LAB — CBC WITH DIFFERENTIAL/PLATELET
Basophils Absolute: 0 10*3/uL (ref 0.0–0.1)
Basophils Relative: 0 %
Eosinophils Absolute: 0.2 10*3/uL (ref 0.0–0.7)
Eosinophils Relative: 2 %
HCT: 23.3 % — ABNORMAL LOW (ref 39.0–52.0)
HEMOGLOBIN: 7.5 g/dL — AB (ref 13.0–17.0)
LYMPHS ABS: 1.7 10*3/uL (ref 0.7–4.0)
Lymphocytes Relative: 13 %
MCH: 29.4 pg (ref 26.0–34.0)
MCHC: 32.2 g/dL (ref 30.0–36.0)
MCV: 91.4 fL (ref 78.0–100.0)
MONOS PCT: 5 %
Monocytes Absolute: 0.6 10*3/uL (ref 0.1–1.0)
NEUTROS ABS: 10.4 10*3/uL — AB (ref 1.7–7.7)
NEUTROS PCT: 80 %
Platelets: 305 10*3/uL (ref 150–400)
RBC: 2.55 MIL/uL — ABNORMAL LOW (ref 4.22–5.81)
RDW: 15.5 % (ref 11.5–15.5)
WBC: 12.9 10*3/uL — AB (ref 4.0–10.5)

## 2016-10-24 LAB — PROTIME-INR
INR: 1.05
PROTHROMBIN TIME: 13.7 s (ref 11.4–15.2)

## 2016-10-24 LAB — COMPREHENSIVE METABOLIC PANEL
ALK PHOS: 36 U/L — AB (ref 38–126)
ALT: 14 U/L — ABNORMAL LOW (ref 17–63)
AST: 16 U/L (ref 15–41)
Albumin: 3 g/dL — ABNORMAL LOW (ref 3.5–5.0)
Anion gap: 10 (ref 5–15)
BUN: 78 mg/dL — ABNORMAL HIGH (ref 6–20)
CALCIUM: 8.6 mg/dL — AB (ref 8.9–10.3)
CO2: 25 mmol/L (ref 22–32)
CREATININE: 6.3 mg/dL — AB (ref 0.61–1.24)
Chloride: 107 mmol/L (ref 101–111)
GFR, EST AFRICAN AMERICAN: 8 mL/min — AB (ref >60)
GFR, EST NON AFRICAN AMERICAN: 7 mL/min — AB (ref >60)
Glucose, Bld: 234 mg/dL — ABNORMAL HIGH (ref 65–99)
Potassium: 4.3 mmol/L (ref 3.5–5.1)
Sodium: 142 mmol/L (ref 135–145)
TOTAL PROTEIN: 5.8 g/dL — AB (ref 6.5–8.1)
Total Bilirubin: 0.4 mg/dL (ref 0.3–1.2)

## 2016-10-24 LAB — POC OCCULT BLOOD, ED: FECAL OCCULT BLD: POSITIVE — AB

## 2016-10-24 LAB — PREPARE RBC (CROSSMATCH)

## 2016-10-24 LAB — LIPASE, BLOOD: LIPASE: 30 U/L (ref 11–51)

## 2016-10-24 LAB — GLUCOSE, CAPILLARY: Glucose-Capillary: 182 mg/dL — ABNORMAL HIGH (ref 65–99)

## 2016-10-24 MED ORDER — SODIUM BICARBONATE 650 MG PO TABS
650.0000 mg | ORAL_TABLET | Freq: Two times a day (BID) | ORAL | Status: DC
  Administered 2016-10-24 – 2016-10-28 (×8): 650 mg via ORAL
  Filled 2016-10-24 (×8): qty 1

## 2016-10-24 MED ORDER — SODIUM CHLORIDE 0.9 % IV BOLUS (SEPSIS)
1000.0000 mL | Freq: Once | INTRAVENOUS | Status: AC
  Administered 2016-10-24: 1000 mL via INTRAVENOUS

## 2016-10-24 MED ORDER — DULOXETINE HCL 60 MG PO CPEP
60.0000 mg | ORAL_CAPSULE | Freq: Every day | ORAL | Status: DC
  Administered 2016-10-24 – 2016-10-28 (×5): 60 mg via ORAL
  Filled 2016-10-24 (×5): qty 1

## 2016-10-24 MED ORDER — SODIUM CHLORIDE 0.9 % IV SOLN: 10.0000 mL/h | Freq: Once | INTRAVENOUS | Status: DC

## 2016-10-24 MED ORDER — SODIUM CHLORIDE 0.9 % IV SOLN: INTRAVENOUS | Status: DC

## 2016-10-24 MED ORDER — INSULIN ASPART 100 UNIT/ML ~~LOC~~ SOLN
0.0000 [IU] | Freq: Three times a day (TID) | SUBCUTANEOUS | Status: DC
  Administered 2016-10-25: 3 [IU] via SUBCUTANEOUS
  Administered 2016-10-25 – 2016-10-26 (×2): 2 [IU] via SUBCUTANEOUS
  Administered 2016-10-26: 3 [IU] via SUBCUTANEOUS
  Administered 2016-10-26: 2 [IU] via SUBCUTANEOUS
  Administered 2016-10-27: 3 [IU] via SUBCUTANEOUS
  Administered 2016-10-27 (×2): 2 [IU] via SUBCUTANEOUS
  Administered 2016-10-28 (×2): 3 [IU] via SUBCUTANEOUS

## 2016-10-24 MED ORDER — PANTOPRAZOLE SODIUM 40 MG IV SOLR
40.0000 mg | Freq: Two times a day (BID) | INTRAVENOUS | Status: DC
  Administered 2016-10-24 – 2016-10-26 (×4): 40 mg via INTRAVENOUS
  Filled 2016-10-24 (×4): qty 40

## 2016-10-24 MED ORDER — PEG 3350-KCL-NABCB-NACL-NASULF 236 G PO SOLR
4000.0000 mL | Freq: Once | ORAL | Status: AC
  Administered 2016-10-24: 4000 mL via ORAL
  Filled 2016-10-24: qty 4000

## 2016-10-24 MED ORDER — PANTOPRAZOLE SODIUM 40 MG IV SOLR
40.0000 mg | Freq: Once | INTRAVENOUS | Status: AC
  Administered 2016-10-24: 40 mg via INTRAVENOUS
  Filled 2016-10-24: qty 40

## 2016-10-24 MED ORDER — ACETAMINOPHEN 650 MG RE SUPP: 650.0000 mg | Freq: Four times a day (QID) | RECTAL | Status: DC | PRN

## 2016-10-24 MED ORDER — TRAMADOL HCL 50 MG PO TABS
50.0000 mg | ORAL_TABLET | Freq: Four times a day (QID) | ORAL | Status: DC | PRN
  Administered 2016-10-26 – 2016-10-28 (×4): 50 mg via ORAL
  Filled 2016-10-24 (×4): qty 1

## 2016-10-24 MED ORDER — ACETAMINOPHEN 325 MG PO TABS: 650.0000 mg | ORAL_TABLET | Freq: Four times a day (QID) | ORAL | Status: DC | PRN

## 2016-10-24 MED ORDER — ACETAMINOPHEN 325 MG PO TABS
650.0000 mg | ORAL_TABLET | Freq: Four times a day (QID) | ORAL | Status: DC | PRN
Start: 2016-10-24 — End: 2016-10-28
  Administered 2016-10-26: 650 mg via ORAL
  Filled 2016-10-24: qty 2

## 2016-10-24 MED ORDER — SODIUM CHLORIDE 0.9 % IV SOLN
INTRAVENOUS | Status: DC
  Administered 2016-10-24: 22:00:00 via INTRAVENOUS

## 2016-10-24 MED ORDER — ONDANSETRON HCL 4 MG PO TABS: 4.0000 mg | ORAL_TABLET | Freq: Four times a day (QID) | ORAL | Status: DC | PRN

## 2016-10-24 MED ORDER — ONDANSETRON HCL 4 MG/2ML IJ SOLN: 4.0000 mg | Freq: Four times a day (QID) | INTRAMUSCULAR | Status: DC | PRN

## 2016-10-24 MED ORDER — TAMSULOSIN HCL 0.4 MG PO CAPS
0.4000 mg | ORAL_CAPSULE | Freq: Every evening | ORAL | Status: DC
  Administered 2016-10-24 – 2016-10-27 (×4): 0.4 mg via ORAL
  Filled 2016-10-24 (×4): qty 1

## 2016-10-24 MED ORDER — LORAZEPAM 1 MG PO TABS
1.0000 mg | ORAL_TABLET | Freq: Every day | ORAL | Status: DC
  Administered 2016-10-24 – 2016-10-28 (×5): 1 mg via ORAL
  Filled 2016-10-24 (×5): qty 1

## 2016-10-24 MED ORDER — FINASTERIDE 5 MG PO TABS
5.0000 mg | ORAL_TABLET | Freq: Every morning | ORAL | Status: DC
  Administered 2016-10-25 – 2016-10-28 (×4): 5 mg via ORAL
  Filled 2016-10-24 (×6): qty 1

## 2016-10-24 NOTE — ED Triage Notes (Signed)
Pt brought in by EMS due to fall and weakness. Pt was in BR and had fallen off toilet due to weakness and EMS reports large amount of red blood from rectum. Pt reports he has been having rectal bleeding for 4 days. Also increase in SOB

## 2016-10-24 NOTE — H&P (Signed)
History and Physical    John [name] DJM:[id number] DOB: Jun 19, 1931 DOA: 10/24/2016  PCP: Purvis Kilts, MD   Patient coming from: Home  Chief Complaint: Weakness, Fall and BRBPR  HPI: John [name] is a 83 y.o. male with medical history significant of HTN, HLD, DM, Chronic Diastolic CHF, CKD Stage 5 (Refusing Dialysis), Hx of GI Bleed, GERD and other comorbids who presented to [facility] with a CC of Rectal Bleeding weakness and fall. Patient states he was in his usual state of health and went to see his Nephrologist on Wednesday who recommended dialysis but patient refused. He states after his appointment he went to Eat in [place] and came back and had a feeling of pressure that he had to urinate and when he went to the bathroom had a dark colored stool. Patient states he has been having dark and bloody stools ever since Wednesday night and this AM he woke up to go have a bowel movement and it was pure blood. Patient got up from the toilet and started ambulating back to the bedroom but became extremely weak and slid down (not hitting his head) and hit his life alert button. After several minutes the life alert wasn't working so patient got up from the floor and reached the phone and dialed 911. EMS came to get the patient and brought the patient in to [facility] for evaluation. Patient admits to being incontinent more than usual and states he gets nauseous more and takes Zofran for his nausea. Denied CP or SOB. TRH was called to admit the patient for GI Bleed.   ED Course: Was given 1 liter of NS, Transfused 2 units of pRBC's and placed on IV Protonix. Gastroenterology was consulted and patient had blood work done.   Review of Systems: As per HPI otherwise 10 point review of systems negative. Denied lightheadedness or dizziness. Admits to being weak.   Past Medical History:  Diagnosis Date  . Bundle branch block, right   . CHF (congestive heart failure) ([place])   . Chronic renal  insufficiency   . DM (diabetes mellitus) ([place])   . 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 GERD (gastroesophageal reflux disease)   . GI bleed    2006, TCS/TI showed small polyp. EGD showed erosive antral gastritis with two polyp, one oozing and tx. HP neg. SB capsule shoed few jejunal ulcers with bleeding (naproxen and ASA)  . GI bleed    02/2010, EGD->pancreatitc rest, fundal gland polyps,  no bleeding. TCS->blood-tinged colonic effluent throughout the colon without bleeding lesion found, nl TI. SB capsule->SB erosions, nonbleeding, incomplete study. Patient on naproxyn.   . GI bleed 10/27/10   ileocolonoscopy/GIVENS capsule study by Dr Rourk->bloody colonic effluent throughout colon but no lesion identified, TA @ hepatic flexure, fresh blood in dital SB on capsule. Nuc med RBC study negative  . HTN (hypertension)   . Hyperlipidemia   . Hypoxia    requiring oxygen while in hospital  . Ischemic colitis, enteritis, or enterocolitis ([place])    flex sig 01/2010  . Kidney disease   . Macular degeneration   . Nephrolithiasis   . Obesity   . Pneumonia   . Poor vision    left eye  . Seborrheic keratoses     back and chest  . Skin cancer   . Sleep apnea   . Stroke ([place])   . Tubular adenoma  of colon 10/27/10   on colonoscopy Dr Gala Romney   Past Surgical History:  Procedure Laterality Date  . APPENDECTOMY    . BACK SURGERY    . CARDIAC CATHETERIZATION    . CARDIOVASCULAR STRESS TEST    . CATARACT EXTRACTION     bilateral  . CHOLECYSTECTOMY    . COLONOSCOPY  10/27/10   Dr. Chauncy Lean adenoma, normal rectum bloody colonic effluent throughout the colon with out bleeding lesion seen.   . COLONOSCOPY N/A 02/16/2014   IYM:EBRAXE appearing TI. Moderate sized internal hemorrhoids/[facility]/[place] colon are redundant/TWO COLON POLYPS REMOVED  . DOPPLER ECHOCARDIOGRAPHY    . ELBOW SURGERY    . ESOPHAGOGASTRODUODENOSCOPY  10/23/10    Dr. Tora Kindred arteriovenous malformations,GI bleed- capsule  . ESOPHAGOGASTRODUODENOSCOPY N/A 02/13/2014   NMM:HWKGSU gland polyps. Pancreatic rest. 2 tiny areas of erosions/ulceration-not likely significant-s/p bx  . GIVENS CAPSULE STUDY N/A 02/14/2014   Distal SB ulcers with active bleeding noted.  Marland Kitchen NM MYOVIEW LTD  2012   Negative for Ischemia  . POLYPECTOMY    . stress dipyridamole myocardial perfusion    . TRANSURETHRAL RESECTION OF PROSTATE     SOCIAL HISTORY  reports that he has never smoked. He has never used smokeless tobacco. He reports that he does not drink alcohol or use drugs.  Allergies  Allergen Reactions  . Asa [Aspirin] Other (See Comments)    GI bleeding  . Latex Other (See Comments)    unknown  . Propoxyphene     Other reaction(s): Dizziness  . Sulfa Antibiotics Other (See Comments)    unknown  . Betadine [Povidone Iodine] Rash    Family History  Problem Relation Age of Onset  . Aneurysm Daughter     brain, deceased age 48  . GI problems Brother     gi bleed  . Cancer Sister     unknown type  . Colon cancer Neg Hx   . Liver disease Neg Hx    Prior to Admission medications   Medication Sig Start Date End Date Taking? Authorizing Provider  acetaminophen (NON-ASPIRIN) 325 MG tablet Take 650 mg by mouth every 6 (six) hours as needed for moderate pain.   Yes Historical Provider, MD  amLODipine (NORVASC) 5 MG tablet Take 7.5 mg by mouth every morning.    Yes Historical Provider, MD  aspirin EC 81 MG tablet Take 81 mg by mouth daily.   Yes Historical Provider, MD  Cholecalciferol (VITAMIN D) 2000 UNITS CAPS Take 1 capsule by mouth every evening.    Yes Historical Provider, MD  Choline Fenofibrate (TRILIPIX) 135 MG capsule Take 135 mg by mouth at bedtime.    Yes Historical Provider, MD  clotrimazole-betamethasone (LOTRISONE) cream Apply 1 application topically daily as needed (for skin irritation).  11/04/15  Yes Historical Provider, MD  DULoxetine  (CYMBALTA) 60 MG capsule Take 1 capsule by mouth daily. 05/07/09  Yes Historical Provider, MD  ferrous sulfate 325 (65 FE) MG tablet Take 325 mg by mouth every evening.    Yes Historical Provider, MD  finasteride (PROSCAR) 5 MG tablet Take 5 mg by mouth every morning.  10/04/14  Yes Historical Provider, MD  furosemide (LASIX) 40 MG tablet Take 80 mg by mouth 2 (two) times daily.  09/26/16  Yes Historical Provider, MD  insulin lispro protamine-lispro (HUMALOG 75/25 MIX) (75-25) 100 UNIT/ML SUSP injection Inject 65 Units into the skin 2 (two) times daily with a meal. 60 units am and 65 units at bedtime 01/29/14  Yes [employer],  MD  LORazepam (ATIVAN) 1 MG tablet Take 1 tablet by mouth daily. 05/07/09  Yes Historical Provider, MD  losartan (COZAAR) 25 MG tablet Take 25 mg by mouth every morning.    Yes Historical Provider, MD  nebivolol (BYSTOLIC) 10 MG tablet Take 10 mg by mouth every morning.    Yes Historical Provider, MD  ondansetron (ZOFRAN ODT) 4 MG disintegrating tablet 4mg  ODT q4 hours prn nausea/vomit 11/19/15  Yes Milton Ferguson, MD  pantoprazole (PROTONIX) 40 MG tablet Take 40 mg by mouth 2 (two) times daily.  02/19/14  Yes Samuella Cota, MD  potassium chloride (KLOR-CON M10) 10 MEQ tablet Take 1 tablet (10 mEq total) by mouth daily. <please make appointment for refills> Patient taking differently: Take 10 mEq by mouth every evening. <please make appointment for refills> 05/09/14  Yes Troy Sine, MD  sodium bicarbonate 650 MG tablet Take 650 mg by mouth 2 (two) times daily.   Yes Historical Provider, MD  tamsulosin (FLOMAX) 0.4 MG CAPS capsule Take 0.4 mg by mouth every evening.    Yes Historical Provider, MD  terazosin (HYTRIN) 10 MG capsule Take 10 mg by mouth at bedtime.   Yes Historical Provider, MD  albuterol (PROVENTIL) (2.5 MG/3ML) 0.083% nebulizer solution Take 3 mLs (2.5 mg total) by nebulization every 2 (two) hours as needed for wheezing or shortness of breath. Patient not taking:  Reported on 10/24/2016 12/29/14   Kathie Dike, MD  traMADol (ULTRAM) 50 MG tablet Take 50 mg by mouth 4 (four) times daily as needed (for arthritis pain).  11/04/15   Historical Provider, MD   Physical Exam: Vitals:   10/24/16 1327 10/24/16 1351 10/24/16 1400 10/24/16 1430  BP:  (!) 153/58 (!) 158/78 140/90  Pulse:  80 (!) 35 84  Resp:      Temp: 98.4 F (36.9 C)     TempSrc: Oral     SpO2:  94% 92% 97%  Weight:      Height:       Constitutional: WN/WD, NAD and appears calm and comfortable Eyes: PERRL, lids and conjunctivae normal, sclerae anicteric  ENMT: Has multiple skin lesions on face and one on left nare. Grossly normal hearing. Mucous membranes are slightly dry and patient has dentures on the top teeth.  Neck: Appears normal, supple, no cervical masses, normal ROM, no appreciable thyromegaly, no JVD Respiratory: Diminished to auscultation bilaterally, no wheezing, rales, rhonchi or crackles. Normal respiratory effort and patient is not tachypenic. No accessory muscle use.  Cardiovascular: RRR, no murmurs / rubs / gallops. S1 and S2 auscultated. 1+ Lower Extremity Edema Abdomen: Soft, non-tender, non-distended. No masses palpated. No appreciable hepatosplenomegaly. Bowel sounds positive x4.  GU: Deferred. Musculoskeletal: No clubbing / cyanosis of digits/nails. No joint deformity upper and lower extremities. Skin: Multiple upper extremity skin lesions and seborrheic keratosis noted. No rashes noted. Skin warm and dry.  Neurologic: CN 2-12 grossly intact with no focal deficits. Strength 5/5 in all 4. Romberg sign cerebellar reflexes not assessed.  Psychiatric: Normal judgment and insight. Alert and oriented x 3. Normal mood and appropriate affect.   Labs on Admission: I have personally reviewed following labs and imaging studies  CBC:  Recent Labs Lab 10/24/16 1308  WBC 12.9*  NEUTROABS 10.4*  HGB 7.5*  HCT 23.3*  MCV 91.4  PLT 009   Basic Metabolic Panel:  Recent  Labs Lab 10/24/16 1308  NA 142  K 4.3  CL 107  CO2 25  GLUCOSE 234*  BUN  78*  CREATININE 6.30*  CALCIUM 8.6*   GFR: Estimated Creatinine Clearance: 10.7 mL/min (A) (by C-G formula based on SCr of 6.3 mg/dL (H)). Liver Function Tests:  Recent Labs Lab 10/24/16 1308  AST 16  ALT 14*  ALKPHOS 36*  BILITOT 0.4  PROT 5.8*  ALBUMIN 3.0*    Recent Labs Lab 10/24/16 1308  LIPASE 30   No results for input(s): AMMONIA in the last 168 hours. Coagulation Profile:  Recent Labs Lab 10/24/16 1308  INR 1.05   Cardiac Enzymes:  Recent Labs Lab 10/24/16 1308  TROPONINI <0.03   BNP (last 3 results) No results for input(s): PROBNP in the last 8760 hours. HbA1C: No results for input(s): HGBA1C in the last 72 hours. CBG: No results for input(s): GLUCAP in the last 168 hours. Lipid Profile: No results for input(s): CHOL, HDL, LDLCALC, TRIG, CHOLHDL, LDLDIRECT in the last 72 hours. Thyroid Function Tests: No results for input(s): TSH, T4TOTAL, FREET4, T3FREE, THYROIDAB in the last 72 hours. Anemia Panel: No results for input(s): VITAMINB12, FOLATE, FERRITIN, TIBC, IRON, RETICCTPCT in the last 72 hours. Urine analysis:    Component Value Date/Time   COLORURINE YELLOW 11/25/2014 2040   APPEARANCEUR CLEAR 11/25/2014 2040   LABSPEC 1.020 11/25/2014 2040   PHURINE 5.0 11/25/2014 2040   GLUCOSEU 250 (A) 11/25/2014 2040   HGBUR TRACE (A) 11/25/2014 2040   BILIRUBINUR NEGATIVE 11/25/2014 2040   KETONESUR NEGATIVE 11/25/2014 2040   PROTEINUR 30 (A) 11/25/2014 2040   UROBILINOGEN 0.2 11/25/2014 2040   NITRITE NEGATIVE 11/25/2014 2040   LEUKOCYTESUR NEGATIVE 11/25/2014 2040   Sepsis Labs: !!!!!!!!!!!!!!!!!!!!!!!!!!!!!!!!!!!!!!!!!!!! @LABRCNTIP (procalcitonin:4,lacticidven:4) )No results found for this or any previous visit (from the past 240 hour(s)).   Radiological Exams on Admission: Dg Chest Portable 1 View  Result Date: 10/24/2016 CLINICAL DATA:  Gastrointestinal  bleeding. Recent fall. Shortness of breath. EXAM: PORTABLE CHEST 1 VIEW COMPARISON:  Nov 19, 2015 FINDINGS: There is no edema or consolidation. Heart is upper normal in size with pulmonary vascularity within normal limits. There is aortic atherosclerosis. No pneumothorax. No bone lesions. No adenopathy. IMPRESSION: Aortic atherosclerosis.  No edema or consolidation. Electronically Signed   By: Lowella Grip III M.D.   On: 10/24/2016 13:28   EKG: Independently reviewed. Showed Sinus Rhythm with a rate of 77 and a RBBB. No evidence of ST elevation on my interpretation.   Assessment/Plan Active Problems:   GI bleed  Acute Rectal Bleeding/Lower GI Bleed possibly from AVM r/o Other Etiologies -Admit to Telemetry -Type and Screen and being Transfused 2 units of pRBC -Gastroenterology Dr. Cammie Sickle by EDP and appreciate Recc's -Clear Liquid Diet for now and NPO at [place] -Patiently likely to get Colonoscopy in AM and then possible Diagnostic EGD -Hb/Hct now 7.5/23.3 -2 Large Bore IV's -C/w IV Protonix 40 mg po BID -C/w NS at 75 mL/hr -Hold ASA -Patient is not on any blood thinners -Has had Hx of GIB that was worked up in 2012 and has Hx of AVM -Monitor H/H q6h -Repeat CBC in AM  Acute on Chronic Anemia in the Setting of GIB  -Has Anemia of Chronic Disease from CKD -Hb/Hct on Admission was 7.5/23.3 -Continue to Monitor H/H q6h -Repeat CBC in AM -Currently being transfused 2 units of pRBC's  Acute on Chronic Kidney Disease Stage 5 -BUN/Cr was 78/6.30 -Discussed with patient's Nephrologist Dr. Theador Hawthorne and he last saw patient on Wednesday 10/21/16 and Cr then was 5.1 -Gentle IVF at 75 mL/hr -Hold Nephrotixic Medications including Losartan 25  mg po Daily and Furosemide 40 mg po Daily -C/w Sodium Bicarbonate 650 mg po BID -Appreciate Nephrology Evaluation and Recommendations -Per Patient he is opposed to going on Dialysis and has made it very evident   Leukocytosis -Likely  reactive to GIB -WBC was 12.9 -Repeat CBC in AM  BPH -C/w Finasteride 5 mg po q Daily and with Tamsulosin 0.4 mg po qHS  Hypertension -Will hold Antihypertensives in the Setting of GIB including Amlodipine 7.5 mg po every morning, Losartan 25 mg po Daily, and Terazosn 10 mg po qHS, and Nebivolol 10 mg po Daily -Continue to Monitor BP's  Hyperlipidemia -Hold Choline Fenofibrate 135 mg po qhS  Diabetes Mellitus Type 2 -Takes Humalog 75/25 Mix with 60 units in AM and 65 Units qHS -Place on Moderate Novolog SSI -Continue to Monitor CBG's -Obtain HbA1c ordered and pending -Last HbA1c was 8.5  Chronic Diastolic CHF -Has some swelling in legs but appears to be compensated -Check BNP -Hold Lasix given risk of hypovolemia from GIB -ECHO done last in 2016 showed EF of 60-65% with Grade 1 Diastolic Dysfunction -Continue to Monitor Strict I's and O's -Assess Volume Status in AM   DVT prophylaxis: SCDs Code Status: FULL CODE Family Communication: No family present at bedside Disposition Plan: Will get PT Eval to determine Consults called: Gastroenterology via EDP; Nephrology Dr. Theador Hawthorne Admission status:   Kerney Elbe, D.O. [facility] Pager [phone]  If 7PM-7AM, please contact night-coverage [email or web address] Password [email or web address]  10/24/2016, 3:11 PM

## 2016-10-24 NOTE — ED Notes (Signed)
Attempted 2nd IV without success.  Difficult time finding vein

## 2016-10-24 NOTE — ED Notes (Signed)
Attempting to get pt to stand for orthostatics .  Pt states he is unable to stand.  States I am too weak.

## 2016-10-24 NOTE — Consult Note (Signed)
Referring Provider: No ref. provider found Primary Care Physician:  Purvis Kilts, MD Primary Gastroenterologist:  Dr.Givanni Staron  Reason for Consultation:  GI bleed. HPI:  Pleasant but complicated 83 year old gentleman with multiple medical problems including congestive heart failure and end-stage renal failure who comes to the hospital today with multiple episodes of painless hematochezia. He was evaluated by the EDP.  He was found to have gross blood on rectal exam. Hemoglobin 7.6. He was admitted for further evaluation. Patient states over the past several days he noticed some black stool as well but today it was grossly bloody. Multiple episodes.  He remains hemodynamically stable on 300. He just completed receiving 1 unit of packed red blood cells.  Past GI history significant for recurrent GI bleeding secondary to gastric AVMs benign gastric polyps, colonic adenoma removed, capsule study in 2015 demonstrated active bleeding in the ileal region with apparent multiple areas of ulceration. This could not be reached with the colonoscope given the redundancy of his colon. Apparently, he stopped bleeding and is done relatively well until now.  He is followed closely by Dr. Hinda Lenis and Theador Hawthorne. He is declined to take dialysis.  It is notable he is not on any anticoagulation or antiplatelet therapy. He denies nonsteroidals-all forms.  Prior to this illness, patient states he's done fairly well. His appetite has been good. He has had some occasional nausea treated with Zofran-felt to be related to progressive renal failure. Denies constipation or diarrhea.  Denies reflux symptoms. No odynophagia or dysphagia.  Past Medical History:  Diagnosis Date  . Bundle branch block, right   . CHF (congestive heart failure) ([place])   . Chronic renal insufficiency   . DM (diabetes mellitus) ([place])   . Gastric AVM 10/24/10   GI bleed EGD w/ bicap and hemoclip placement, 2 AVMs, presented w/bright red  rectal bleeding  . GERD (gastroesophageal reflux disease)   . GI bleed    2006, TCS/TI showed small polyp. EGD showed erosive antral gastritis with two polyp, one oozing and tx. HP neg. SB capsule shoed few jejunal ulcers with bleeding (naproxen and ASA)  . GI bleed    02/2010, EGD->pancreatitc rest, fundal gland polyps,  no bleeding. TCS->blood-tinged colonic effluent throughout the colon without bleeding lesion found, nl TI. SB capsule->SB erosions, nonbleeding, incomplete study. Patient on naproxyn.   . GI bleed 10/27/10   ileocolonoscopy/GIVENS capsule study by Dr Joylene Wescott->bloody colonic effluent throughout colon but no lesion identified, TA @ hepatic flexure, fresh blood in dital SB on capsule. Nuc med RBC study negative  . HTN (hypertension)   . Hyperlipidemia   . Hypoxia    requiring oxygen while in hospital  . Ischemic colitis, enteritis, or enterocolitis ([place])    flex sig 01/2010  . Kidney disease   . Macular degeneration   . Nephrolithiasis   . Obesity   . Pneumonia   . Poor vision    left eye  . Seborrheic keratoses     back and chest  . Skin cancer   . Sleep apnea   . Stroke ([place])   . Tubular adenoma of colon 10/27/10   on colonoscopy Dr Gala Romney    Past Surgical History:  Procedure Laterality Date  . APPENDECTOMY    . BACK SURGERY    . CARDIAC CATHETERIZATION    . CARDIOVASCULAR STRESS TEST    . CATARACT EXTRACTION     bilateral  . CHOLECYSTECTOMY    . COLONOSCOPY  10/27/10   Dr. Chauncy Lean adenoma,  normal rectum bloody colonic effluent throughout the colon with out bleeding lesion seen.   . COLONOSCOPY N/A 02/16/2014   SWF:UXNATF appearing TI. Moderate sized internal hemorrhoids/TheTC/[place] colon are redundant/TWO COLON POLYPS REMOVED  . DOPPLER ECHOCARDIOGRAPHY    . ELBOW SURGERY    . ESOPHAGOGASTRODUODENOSCOPY  10/23/10   Dr. Tora Kindred arteriovenous malformations,GI bleed- capsule  . ESOPHAGOGASTRODUODENOSCOPY N/A 02/13/2014   TDD:[place]GURK gland polyps.  Pancreatic rest. 2 tiny areas of erosions/ulceration-not likely significant-s/p bx  . GIVENS CAPSULE STUDY N/A 02/14/2014   Distal SB ulcers with active bleeding noted.  Marland Kitchen NM MYOVIEW LTD  2012   Negative for Ischemia  . POLYPECTOMY    . stress dipyridamole myocardial perfusion    . TRANSURETHRAL RESECTION OF PROSTATE      Prior to Admission medications   Medication Sig Start Date End Date Taking? Authorizing Provider  acetaminophen (NON-ASPIRIN) 325 MG tablet Take 650 mg by mouth every 6 (six) hours as needed for moderate pain.   Yes Historical Provider, MD  amLODipine (NORVASC) 5 MG tablet Take 7.5 mg by mouth every morning.    Yes Historical Provider, MD  aspirin EC 81 MG tablet Take 81 mg by mouth daily.   Yes Historical Provider, MD  Cholecalciferol (VITAMIN D) 2000 UNITS CAPS Take 1 capsule by mouth every evening.    Yes Historical Provider, MD  Choline Fenofibrate (TRILIPIX) 135 MG capsule Take 135 mg by mouth at bedtime.    Yes Historical Provider, MD  clotrimazole-betamethasone (LOTRISONE) cream Apply 1 application topically daily as needed (for skin irritation).  11/04/15  Yes Historical Provider, MD  DULoxetine (CYMBALTA) 60 MG capsule Take 1 capsule by mouth daily. 05/07/09  Yes Historical Provider, MD  ferrous sulfate 325 (65 FE) MG tablet Take 325 mg by mouth every evening.    Yes Historical Provider, MD  finasteride (PROSCAR) 5 MG tablet Take 5 mg by mouth every morning.  10/04/14  Yes Historical Provider, MD  furosemide (LASIX) 40 MG tablet Take 80 mg by mouth 2 (two) times daily.  09/26/16  Yes Historical Provider, MD  insulin lispro protamine-lispro (HUMALOG 75/25 MIX) (75-25) 100 UNIT/ML SUSP injection Inject 65 Units into the skin 2 (two) times daily with a meal. 60 units am and 65 units at bedtime 01/29/14  Yes Kathie Dike, MD  LORazepam (ATIVAN) 1 MG tablet Take 1 tablet by mouth daily. 05/07/09  Yes Historical Provider, MD  losartan (COZAAR) 25 MG tablet Take 25 mg by mouth  every morning.    Yes Historical Provider, MD  nebivolol (BYSTOLIC) 10 MG tablet Take 10 mg by mouth every morning.    Yes Historical Provider, MD  ondansetron (ZOFRAN ODT) 4 MG disintegrating tablet 4mg  ODT q4 hours prn nausea/vomit 11/19/15  Yes Milton Ferguson, MD  pantoprazole (PROTONIX) 40 MG tablet Take 40 mg by mouth 2 (two) times daily.  02/19/14  Yes Samuella Cota, MD  potassium chloride (KLOR-CON M10) 10 MEQ tablet Take 1 tablet (10 mEq total) by mouth daily. <please make appointment for refills> Patient taking differently: Take 10 mEq by mouth every evening. <please make appointment for refills> 05/09/14  Yes Troy Sine, MD  sodium bicarbonate 650 MG tablet Take 650 mg by mouth 2 (two) times daily.   Yes Historical Provider, MD  tamsulosin (FLOMAX) 0.4 MG CAPS capsule Take 0.4 mg by mouth every evening.    Yes Historical Provider, MD  terazosin (HYTRIN) 10 MG capsule Take 10 mg by mouth at bedtime.   Yes Historical  Provider, MD  albuterol (PROVENTIL) (2.5 MG/3ML) 0.083% nebulizer solution Take 3 mLs (2.5 mg total) by nebulization every 2 (two) hours as needed for wheezing or shortness of breath. Patient not taking: Reported on 10/24/2016 12/29/14   Kathie Dike, MD  traMADol (ULTRAM) 50 MG tablet Take 50 mg by mouth 4 (four) times daily as needed (for arthritis pain).  11/04/15   Historical Provider, MD    Current Facility-Administered Medications  Medication Dose Route Frequency Provider Last Rate Last Dose  . 0.9 %  sodium chloride infusion  10 mL/hr Intravenous Once Isla Pence, MD      . 0.9 %  sodium chloride infusion   Intravenous Continuous Omair Latif Sheikh, DO      . pantoprazole (PROTONIX) injection 40 mg  40 mg Intravenous Q12H Soldier, DO        Allergies as of 10/24/2016 - Review Complete 10/24/2016  Allergen Reaction Noted  . Asa [aspirin] Other (See Comments) 12/26/2014  . Latex Other (See Comments)   . Propoxyphene  01/27/2014  . Sulfa antibiotics  Other (See Comments) 01/27/2014  . Betadine [povidone iodine] Rash 02/13/2011    Family History  Problem Relation Age of Onset  . Aneurysm Daughter     brain, deceased age 15  . GI problems Brother     gi bleed  . Cancer Sister     unknown type  . Colon cancer Neg Hx   . Liver disease Neg Hx     Social History   Social History  . Marital status: Widowed    Spouse name: N/A  . Number of children: 2  . Years of education: N/A   Occupational History  . retired     [employer]   Social History Main Topics  . Smoking status: Never Smoker  . Smokeless tobacco: Never Used  . Alcohol use No  . Drug use: No  . Sexual activity: No   Other Topics Concern  . Not on file   Social History Narrative  . No narrative on file    Review of Systems:  As in history of present illness.  Physical Exam: Vital signs in last 24 hours: Temp:  [97.5 F (36.4 C)-98.4 F (36.9 C)] 98.1 F (36.7 C) (04/28 1548) Pulse Rate:  [35-84] 82 (04/28 1600) Resp:  [12-16] 16 (04/28 1548) BP: (133-166)/(53-90) 162/66 (04/28 1600) SpO2:  [92 %-99 %] 99 % (04/28 1600) Weight:  [250 lb (113.4 kg)] 250 lb (113.4 kg) (04/28 1255)   General:   Alert,  talkative pleasant and cooperative in NAD. Accompanied by daughter. Head: Multiple face and neck lesions consistent with skin cancers and prior treatment Sclera clear, no icterus.   Conjunctiva pale Lungs:  Clear throughout to auscultation.   No wheezes, crackles, or rhonchi. No acute distress. Heart:  Regular rate and rhythm; no murmurs, clicks, rubs,  or gallops. Abdomen:  Obese. Positive bowel sounds. Soft and nontender without obvious mass or organomegaly Rectal:  Deferred; done in ED   Extremities:  Without clubbing or edema. Multiple skin tags present. Intake/Output from previous day: No intake/output data recorded. Intake/Output this shift: Total I/O In: 350 [Blood:350] Out: -   Lab Results:  Recent Labs  10/24/16 1308  WBC 12.9*    HGB 7.5*  HCT 23.3*  PLT 305   BMET  Recent Labs  10/24/16 1308  NA 142  K 4.3  CL 107  CO2 25  GLUCOSE 234*  BUN 78*  CREATININE 6.30*  CALCIUM  8.6*   LFT  Recent Labs  10/24/16 1308  PROT 5.8*  ALBUMIN 3.0*  AST 16  ALT 14*  ALKPHOS 36*  BILITOT 0.4   PT/INR  Recent Labs  10/24/16 1308  LABPROT 13.7  INR 1.05   Impression:  Pleasant 83 year old gentleman with multiple comorbidities admitted with over GI bleeding of obscure etiology. History of active bleeding demonstrated in the distal terminal ileum.  Also, history of gastric AVMs. He describes melena recently with superimposed hematochezia today. He does take iron regularly but he states his stool have become quite dark recently. History of chronic nausea now felt to be related to progressing renal failure. He has had no abdominal pain, whatsoever. This makes ischemia seem less likely. No history of nonsteroidal ingestion. With renal failure, AVMs could be an issue anywhere along the GI tract.  Although he has decided to take a conservative approach as far as his renal failure is concerned, he very much would like to get his bleeding treated/sorted out as feasible.  Recommendations:  I have offered the patient a diagnostic colonoscopy tomorrow morning. His colon was previously redundant and very difficult to survey. I feel this is the appropriate place to start. I told he and his daughter that if colonoscopy was unrevealing then I would proceed with a diagnostic EGD in the same setting. The risks, benefits, limitations, imponderables and alternatives regarding both EGD and colonoscopy have been reviewed with the patient. Questions have been answered. All parties agreeable.   I fully agree with empiric PPI therapy at this time.  Overall prognosis is guarded.  Further recommendations to follow.      Notice:  This dictation was prepared with Dragon dictation along with smaller phrase technology. Any  transcriptional errors that result from this process are unintentional and may not be corrected upon review.

## 2016-10-24 NOTE — Consult Note (Signed)
John [name] MRN: [id number] DOB/AGE: Apr 29, 1931 83 y.o. Primary Care Physician:[name], Betsy Coder, MD Admit date: 10/24/2016 Chief Complaint:  Chief Complaint  Patient presents with  . Rectal Bleeding   HPI: Pt is  83 year old male with past medical hx of CKD who was brought to ER with c/o bleeding per rectum  HPI dates to 2 days ago when pt having bleeding per rectum, pt had 2-3 episodes on each day. Pt says " I had so much blood in the commode today" Pt says "I was so dizzy that I laid in the floor and called EMS" NO c/o head trauma from fall. Pt offer no c/o chest pain NO c/o fever/cough /chills  NO c/o abdominal pain Pt does c/o  Nausea and takes zofran on PRN basis but NO c/o vomiting NO c/o melena   Past Medical History:  Diagnosis Date  . Bundle branch block, right   . CHF (congestive heart failure) ([place])   . Chronic renal insufficiency   . DM (diabetes mellitus) ([place])   . 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 GERD (gastroesophageal reflux disease)   . GI bleed    2006, TCS/TI showed small polyp. EGD showed erosive antral gastritis with two polyp, one oozing and tx. HP neg. SB capsule shoed few jejunal ulcers with bleeding (naproxen and ASA)  . GI bleed    02/2010, EGD->pancreatitc rest, fundal gland polyps,  no bleeding. TCS->blood-tinged colonic effluent throughout the colon without bleeding lesion found, nl TI. SB capsule->SB erosions, nonbleeding, incomplete study. Patient on naproxyn.   . GI bleed 10/27/10   ileocolonoscopy/GIVENS capsule study by Dr Rourk->bloody colonic effluent throughout colon but no lesion identified, TA @ hepatic flexure, fresh blood in dital SB on capsule. Nuc med RBC study negative  . HTN (hypertension)   . Hyperlipidemia   . Hypoxia    requiring oxygen while in hospital  . Ischemic colitis, enteritis, or enterocolitis ([place])    flex sig 01/2010  . Kidney  disease   . Macular degeneration   . Nephrolithiasis   . Obesity   . Pneumonia   . Poor vision    left eye  . Seborrheic keratoses     back and chest  . Skin cancer   . Sleep apnea   . Stroke ([place])   . Tubular adenoma of colon 10/27/10   on colonoscopy Dr Gala Romney        Family History  Problem Relation Age of Onset  . Aneurysm Daughter     brain, deceased age 52  . GI problems Brother     gi bleed  . Cancer Sister     unknown type  . Colon cancer Neg Hx   . Liver disease Neg Hx     Social History:  reports that he has never smoked. He has never used smokeless tobacco. He reports that he does not drink alcohol or use drugs.   Allergies:  Allergies  Allergen Reactions  . Asa [Aspirin] Other (See Comments)    GI bleeding  . Latex Other (See Comments)    unknown  . Propoxyphene     Other reaction(s): Dizziness  . Sulfa Antibiotics Other (See Comments)    unknown  . Betadine [Povidone Iodine] Rash     (Not in a hospital admission)       DGU:YQIHK from the symptoms mentioned above,there are no other symptoms referable to  all systems reviewed.  Physical Exam: Vital signs in last 24 hours: Temp:  [97.5 F (36.4 C)-98.4 F (36.9 C)] 98.4 F (36.9 C) (04/28 1327) Pulse Rate:  [79-81] 80 (04/28 1351) Resp:  [12-16] 16 (04/28 1323) BP: (133-153)/(53-83) 153/58 (04/28 1351) SpO2:  [94 %-99 %] 94 % (04/28 1351) Weight:  [250 lb (113.4 kg)] 250 lb (113.4 kg) (04/28 1255) Weight change:     Intake/Output from previous day: No intake/output data recorded. No intake/output data recorded.   Physical Exam: General- pt is awake,alert, oriented to time place and person Resp- No acute REsp distress,NO Rhonchi CVS- S1S2 regular in rate and rhythm GIT- BS+, soft, NT, ND EXT- 1+ LE Edema, NO  Cyanosis CNS- CN 2-12 grossly intact. Moving all 4 extremities Psych- normal mood and affect    Lab Results: CBC  Recent Labs  10/24/16 1308  WBC 12.9*  HGB  7.5*  HCT 23.3*  PLT 305   hgb 11.3=>7.5  BMET  Recent Labs  10/24/16 1308  NA 142  K 4.3  CL 107  CO2 25  GLUCOSE 234*  BUN 78*  CREATININE 6.30*  CALCIUM 8.6*   Creat trend 2018 4.3=>5.1=>6.3 2017 4.3  2016 3.1--4.0 2015 2.8--4.4 2014  2.1--2.6 2013  2.0 2012  1.6--2.1 2011  1.5--1.8 2010 1.3--1.5 2009  1.2  Lab Results  Component Value Date   CALCIUM 8.6 (L) 10/24/2016   CAION 1.17 10/19/2012   PHOS 3.0 01/15/2010      Impression: 1)Renal  AKI secondary to Prerenal/ATN               AKI sec to Hypovolemia               AKI sec to GI bleed causing severe anemia               AKI on CKD               CKD stage 5.               CKD since 2009               CKD secondary to DM/ Age associated decline                Progression of CKD now marked with AKI                 Proteinura  Present                 Pt in the regular outpt appoinment has always maintained that he does NOT want to go on dialysis.  2)HTN  Medication-  On Diuretics as outpt On Beta blockers asoutpt Losartan was dced    3)Anemia HGb low Anemia of chronic ds Admitted with GI bleed  4)CKD Mineral-Bone Disorder PTH elevated.Secondary Hyperparathyroidism present. Phosphorus at goal. Vitamin d low- on replacement   5)BPH- Primary  MD following  6)Electrolytes Normokalemic NOrmonatremic   7)Acid base Co2 at goal  8) DM- Pt has hx of DM. Being followed by Primary team .      Plan:  Agree with current tx and plan. Agree with not giving ARB  I again discussed the ned for renal replacement therapy but pt insists that he does not want it at any cost. I later tried to call his daughter who is very involved in his care and always comes with him at his appointments but I was not able to reach. I left HIPPA  complaint message.        BHUTANI,MANPREET S 10/24/2016, 2:44 PM

## 2016-10-24 NOTE — ED Provider Notes (Signed)
[facility] DEPT Provider Note   CSN: [id number] Arrival date & time:        History   Chief Complaint Chief Complaint  Patient presents with  . Rectal Bleeding    HPI John [name] is a 83 y.o. male.  Pt presents to the ED today with rectal bleeding.  Pt noticed it 4 days ago.  The pt was having a bowel movement today and was unable to get up, he was so weak.  EMS reports a large amount of blood in the bathroom.  Pt denies any pain.  He denies prior GI bleeds, but pmhx shows hx of GI bleeds.  He has seen Dr. Gala Romney in the past.  The pt feels weak and more sob than usual.      Past Medical History:  Diagnosis Date  . Bundle branch block, right   . CHF (congestive heart failure) ([place])   . Chronic renal insufficiency   . DM (diabetes mellitus) ([place])   . 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 GERD (gastroesophageal reflux disease)   . GI bleed    2006, TCS/TI showed small polyp. EGD showed erosive antral gastritis with two polyp, one oozing and tx. HP neg. SB capsule shoed few jejunal ulcers with bleeding (naproxen and ASA)  . GI bleed    02/2010, EGD->pancreatitc rest, fundal gland polyps,  no bleeding. TCS->blood-tinged colonic effluent throughout the colon without bleeding lesion found, nl TI. SB capsule->SB erosions, nonbleeding, incomplete study. Patient on naproxyn.   . GI bleed 10/27/10   ileocolonoscopy/GIVENS capsule study by Dr Rourk->bloody colonic effluent throughout colon but no lesion identified, TA @ hepatic flexure, fresh blood in dital SB on capsule. Nuc med RBC study negative  . HTN (hypertension)   . Hyperlipidemia   . Hypoxia    requiring oxygen while in hospital  . Ischemic colitis, enteritis, or enterocolitis ([place])    flex sig 01/2010  . Kidney disease   . Macular degeneration   . Nephrolithiasis   . Obesity   . Pneumonia   . Poor vision    left eye  . Seborrheic  keratoses     back and chest  . Skin cancer   . Sleep apnea   . Stroke ([place])   . Tubular adenoma of colon 10/27/10   on colonoscopy Dr Gala Romney    Patient Active Problem List   Diagnosis Date Noted  . HCAP (healthcare-associated pneumonia) 12/26/2014  . Chronic diastolic congestive heart failure ([place]) 12/26/2014  . CKD (chronic kidney disease) stage 4, GFR 15-29 ml/min ([facility]) 12/26/2014  . HLD (hyperlipidemia) 12/26/2014  . Failure to thrive in adult 11/25/2014  . Acute renal failure superimposed on stage 4 chronic kidney disease ([place]) 02/12/2014  . GI bleed 02/12/2014  . Esophageal dysphagia 05/02/2013  . CAP (community acquired pneumonia) 10/20/2012  . Acute respiratory failure with hypoxia ([place]) 10/20/2012  . DM type 2 (diabetes mellitus, type 2) ([place]) 10/19/2012  . Dyspnea 10/19/2012  . Acute on chronic diastolic CHF (congestive heart failure) ([place]) 10/19/2012  . Upper abdominal pain 04/06/2012  . Left flank pain 04/06/2012  . Groin pain 04/06/2012  . SOB (shortness of breath) 04/06/2012  . Physical deconditioning 02/18/2011  . Pyelonephritis 02/13/2011  . Weakness generalized 02/13/2011  . Fall 02/13/2011  . CKD (chronic kidney disease), stage IV ([place]) 02/13/2011  . BPH (benign prostatic hypertrophy) 02/13/2011  . Abdominal pain, acute, left  lower quadrant 10/17/2010  . Constipation 10/17/2010  . Melena 10/17/2010  . Anemia 04/25/2010  . COLONIC POLYPS, HX OF 03/04/2010  . HYPERLIPIDEMIA 02/27/2010  . EXOGENOUS OBESITY 02/27/2010  . Essential hypertension 02/27/2010  . CVA 02/27/2010  . GERD 02/27/2010  . GASTROINTESTINAL HEMORRHAGE, HX OF 02/27/2010    Past Surgical History:  Procedure Laterality Date  . APPENDECTOMY    . BACK SURGERY    . CARDIAC CATHETERIZATION    . CARDIOVASCULAR STRESS TEST    . CATARACT EXTRACTION     bilateral  . CHOLECYSTECTOMY    . COLONOSCOPY  10/27/10   Dr. Chauncy Lean adenoma, normal rectum bloody colonic effluent throughout the  colon with out bleeding lesion seen.   . COLONOSCOPY N/A 02/16/2014   OHY:WVPXTG appearing TI. Moderate sized internal hemorrhoids/TheTC/[place] colon are redundant/TWO COLON POLYPS REMOVED  . DOPPLER ECHOCARDIOGRAPHY    . ELBOW SURGERY    . ESOPHAGOGASTRODUODENOSCOPY  10/23/10   Dr. Tora Kindred arteriovenous malformations,GI bleed- capsule  . ESOPHAGOGASTRODUODENOSCOPY N/A 02/13/2014   GYI:RSWNIO gland polyps. Pancreatic rest. 2 tiny areas of erosions/ulceration-not likely significant-s/p bx  . GIVENS CAPSULE STUDY N/A 02/14/2014   Distal SB ulcers with active bleeding noted.  Marland Kitchen NM MYOVIEW LTD  2012   Negative for Ischemia  . POLYPECTOMY    . stress dipyridamole myocardial perfusion    . TRANSURETHRAL RESECTION OF PROSTATE         Home Medications    Prior to Admission medications   Medication Sig Start Date End Date Taking? Authorizing Provider  acetaminophen (NON-ASPIRIN) 325 MG tablet Take 650 mg by mouth every 6 (six) hours as needed for moderate pain.   Yes Historical Provider, MD  amLODipine (NORVASC) 5 MG tablet Take 7.5 mg by mouth every morning.    Yes Historical Provider, MD  aspirin EC 81 MG tablet Take 81 mg by mouth daily.   Yes Historical Provider, MD  Cholecalciferol (VITAMIN D) 2000 UNITS CAPS Take 1 capsule by mouth every evening.    Yes Historical Provider, MD  Choline Fenofibrate (TRILIPIX) 135 MG capsule Take 135 mg by mouth at bedtime.    Yes Historical Provider, MD  clotrimazole-betamethasone (LOTRISONE) cream Apply 1 application topically daily as needed (for skin irritation).  11/04/15  Yes Historical Provider, MD  DULoxetine (CYMBALTA) 60 MG capsule Take 1 capsule by mouth daily. 05/07/09  Yes Historical Provider, MD  ferrous sulfate 325 (65 FE) MG tablet Take 325 mg by mouth every evening.    Yes Historical Provider, MD  finasteride (PROSCAR) 5 MG tablet Take 5 mg by mouth every morning.  10/04/14  Yes Historical Provider, MD  furosemide (LASIX) 40 MG tablet Take 80  mg by mouth 2 (two) times daily.  09/26/16  Yes Historical Provider, MD  insulin lispro protamine-lispro (HUMALOG 75/25 MIX) (75-25) 100 UNIT/ML SUSP injection Inject 65 Units into the skin 2 (two) times daily with a meal. 60 units am and 65 units at bedtime 01/29/14  Yes Kathie Dike, MD  LORazepam (ATIVAN) 1 MG tablet Take 1 tablet by mouth daily. 05/07/09  Yes Historical Provider, MD  losartan (COZAAR) 25 MG tablet Take 25 mg by mouth every morning.    Yes Historical Provider, MD  nebivolol (BYSTOLIC) 10 MG tablet Take 10 mg by mouth every morning.    Yes Historical Provider, MD  ondansetron (ZOFRAN ODT) 4 MG disintegrating tablet 4mg  ODT q4 hours prn nausea/vomit 11/19/15  Yes Milton Ferguson, MD  pantoprazole (PROTONIX) 40 MG tablet Take 40 mg  by mouth 2 (two) times daily.  02/19/14  Yes Samuella Cota, MD  potassium chloride (KLOR-CON M10) 10 MEQ tablet Take 1 tablet (10 mEq total) by mouth daily. <please make appointment for refills> Patient taking differently: Take 10 mEq by mouth every evening. <please make appointment for refills> 05/09/14  Yes Troy Sine, MD  sodium bicarbonate 650 MG tablet Take 650 mg by mouth 2 (two) times daily.   Yes Historical Provider, MD  tamsulosin (FLOMAX) 0.4 MG CAPS capsule Take 0.4 mg by mouth every evening.    Yes Historical Provider, MD  terazosin (HYTRIN) 10 MG capsule Take 10 mg by mouth at bedtime.   Yes Historical Provider, MD  albuterol (PROVENTIL) (2.5 MG/3ML) 0.083% nebulizer solution Take 3 mLs (2.5 mg total) by nebulization every 2 (two) hours as needed for wheezing or shortness of breath. Patient not taking: Reported on 10/24/2016 12/29/14   Kathie Dike, MD  traMADol (ULTRAM) 50 MG tablet Take 50 mg by mouth 4 (four) times daily as needed (for arthritis pain).  11/04/15   Historical Provider, MD    Family History Family History  Problem Relation Age of Onset  . Aneurysm Daughter     brain, deceased age 18  . GI problems Brother     gi bleed    . Cancer Sister     unknown type  . Colon cancer Neg Hx   . Liver disease Neg Hx     Social History Social History  Substance Use Topics  . Smoking status: Never Smoker  . Smokeless tobacco: Never Used  . Alcohol use No     Allergies   Asa [aspirin]; Latex; Propoxyphene; Sulfa antibiotics; and Betadine [povidone iodine]   Review of Systems Review of Systems  Constitutional: Positive for fatigue.  Respiratory: Positive for shortness of breath.   Gastrointestinal: Positive for blood in stool.  All other systems reviewed and are negative.    Physical Exam Updated Vital Signs BP (!) 153/58   Pulse 80   Temp 98.4 F (36.9 C) (Oral)   Resp 16   Ht 5\' 7"  (1.702 m)   Wt 250 lb (113.4 kg)   SpO2 94%   BMI 39.16 kg/m   Physical Exam  Constitutional: He is oriented to person, place, and time. He appears well-developed. He appears distressed.  HENT:  Head: Normocephalic and atraumatic.  Right Ear: External ear normal.  Left Ear: External ear normal.  Nose: Nose normal.  Mouth/Throat: Oropharynx is clear and moist.  Eyes: Conjunctivae and EOM are normal. Pupils are equal, round, and reactive to light.  Neck: Normal range of motion. Neck supple.  Cardiovascular: Normal rate, regular rhythm, normal heart sounds and intact distal pulses.   Pulmonary/Chest: Effort normal and breath sounds normal.  Abdominal: Soft. Bowel sounds are normal.  Genitourinary: Rectal exam shows guaiac positive stool.  Musculoskeletal: Normal range of motion.  Neurological: He is alert and oriented to person, place, and time.  Skin: Skin is warm.  Necrotic lesion to left of nose.  ? Squamous cell ca?  Psychiatric: He has a normal mood and affect. His behavior is normal. Judgment and thought content normal.  Nursing note and vitals reviewed.    ED Treatments / Results  Labs (all labs ordered are listed, but only abnormal results are displayed) Labs Reviewed  COMPREHENSIVE METABOLIC PANEL  - Abnormal; Notable for the following:       Result Value   Glucose, Bld 234 (*)    BUN 78 (*)  Creatinine, Ser 6.30 (*)    Calcium 8.6 (*)    Total Protein 5.8 (*)    Albumin 3.0 (*)    ALT 14 (*)    Alkaline Phosphatase 36 (*)    GFR calc non Af Amer 7 (*)    GFR calc Af Amer 8 (*)    All other components within normal limits  CBC WITH DIFFERENTIAL/PLATELET - Abnormal; Notable for the following:    WBC 12.9 (*)    RBC 2.55 (*)    Hemoglobin 7.5 (*)    HCT 23.3 (*)    Neutro Abs 10.4 (*)    All other components within normal limits  POC OCCULT BLOOD, ED - Abnormal; Notable for the following:    Fecal Occult Bld POSITIVE (*)    All other components within normal limits  PROTIME-INR  LIPASE, BLOOD  TROPONIN I  TYPE AND SCREEN  PREPARE RBC (CROSSMATCH)    EKG  EKG Interpretation  Date/Time:  Saturday October 24 2016 13:00:52 EDT Ventricular Rate:  77 PR Interval:    QRS Duration: 149 QT Interval:  423 QTC Calculation: 479 R Axis:   80 Text Interpretation:  Sinus rhythm Right bundle branch block No significant change since last tracing Confirmed by [facility] MD, Jewett Mcgann ([id number]) on 10/24/2016 1:09:52 PM       Radiology Dg Chest Portable 1 View  Result Date: 10/24/2016 CLINICAL DATA:  Gastrointestinal bleeding. Recent fall. Shortness of breath. EXAM: PORTABLE CHEST 1 VIEW COMPARISON:  Nov 19, 2015 FINDINGS: There is no edema or consolidation. Heart is upper normal in size with pulmonary vascularity within normal limits. There is aortic atherosclerosis. No pneumothorax. No bone lesions. No adenopathy. IMPRESSION: Aortic atherosclerosis.  No edema or consolidation. Electronically Signed   By: Lowella Grip III M.D.   On: 10/24/2016 13:28    Procedures Procedures (including critical care time)  Medications Ordered in ED Medications  sodium chloride 0.9 % bolus 1,000 mL (1,000 mLs Intravenous New Bag/Given 10/24/16 1322)    And  0.9 %  sodium chloride infusion (not  administered)  0.9 %  sodium chloride infusion (not administered)  pantoprazole (PROTONIX) injection 40 mg (40 mg Intravenous Given 10/24/16 1348)     Initial Impression / Assessment and Plan / ED Course  I have reviewed the triage vital signs and the nursing notes.  Pertinent labs & imaging results that were available during my care of the patient were reviewed by me and considered in my medical decision making (see chart for details).    CRITICAL CARE Performed by: Isla Pence   Total critical care time: 30 minutes  Critical care time was exclusive of separately billable procedures and treating other patients.  Critical care was necessary to treat or prevent imminent or life-threatening deterioration.  Critical care was time spent personally by me on the following activities: development of treatment plan with patient and/or surrogate as well as nursing, discussions with consultants, evaluation of patient's response to treatment, examination of patient, obtaining history from patient or surrogate, ordering and performing treatments and interventions, ordering and review of laboratory studies, ordering and review of radiographic studies, pulse oximetry and re-evaluation of patient's condition.   Pt d/w Dr. Gala Romney (GI) who will see pt in consult.  He will likely do a colonoscopy tomorrow.    Pt d/w hospitalists (Dr. Alfredia Ferguson) for admission.  Final Clinical Impressions(s) / ED Diagnoses   Final diagnoses:  Acute GI bleeding  Acute post-hemorrhagic anemia  Acute renal failure superimposed on  stage 5 chronic kidney disease, not on chronic dialysis, unspecified acute renal failure type ([place])  Poorly controlled type 2 diabetes mellitus ([place])    New Prescriptions New Prescriptions   No medications on file     Isla Pence, MD 10/24/16 1409

## 2016-10-25 ENCOUNTER — Encounter (HOSPITAL_COMMUNITY)
Admission: EM | Disposition: A | Discharge status: Skilled Nursing Facility | Payer: Self-pay | Source: Home / Self Care | Attending: Internal Medicine

## 2016-10-25 ENCOUNTER — Encounter (HOSPITAL_COMMUNITY): Payer: Self-pay

## 2016-10-25 DIAGNOSIS — K640 First degree hemorrhoids: Secondary | ICD-10-CM

## 2016-10-25 HISTORY — PX: COLONOSCOPY: SHX5424

## 2016-10-25 HISTORY — PX: ESOPHAGOGASTRODUODENOSCOPY: SHX5428

## 2016-10-25 LAB — CBC WITH DIFFERENTIAL/PLATELET
BASOS ABS: 0 10*3/uL (ref 0.0–0.1)
BASOS PCT: 0 %
Eosinophils Absolute: 0.3 10*3/uL (ref 0.0–0.7)
Eosinophils Relative: 2 %
HEMATOCRIT: 25.5 % — AB (ref 39.0–52.0)
Hemoglobin: 8.1 g/dL — ABNORMAL LOW (ref 13.0–17.0)
Lymphocytes Relative: 15 %
Lymphs Abs: 1.9 10*3/uL (ref 0.7–4.0)
MCH: 27.6 pg (ref 26.0–34.0)
MCHC: 31.8 g/dL (ref 30.0–36.0)
MCV: 87 fL (ref 78.0–100.0)
MONO ABS: 0.7 10*3/uL (ref 0.1–1.0)
Monocytes Relative: 5 %
NEUTROS ABS: 9.8 10*3/uL — AB (ref 1.7–7.7)
NEUTROS PCT: 78 %
Platelets: 277 10*3/uL (ref 150–400)
RBC: 2.93 MIL/uL — ABNORMAL LOW (ref 4.22–5.81)
RDW: 19.2 % — AB (ref 11.5–15.5)
WBC: 12.7 10*3/uL — AB (ref 4.0–10.5)

## 2016-10-25 LAB — COMPREHENSIVE METABOLIC PANEL
ALBUMIN: 2.9 g/dL — AB (ref 3.5–5.0)
ALT: 13 U/L — ABNORMAL LOW (ref 17–63)
AST: 18 U/L (ref 15–41)
Alkaline Phosphatase: 35 U/L — ABNORMAL LOW (ref 38–126)
Anion gap: 9 (ref 5–15)
BILIRUBIN TOTAL: 0.5 mg/dL (ref 0.3–1.2)
BUN: 68 mg/dL — AB (ref 6–20)
CHLORIDE: 107 mmol/L (ref 101–111)
CO2: 23 mmol/L (ref 22–32)
Calcium: 8.2 mg/dL — ABNORMAL LOW (ref 8.9–10.3)
Creatinine, Ser: 5.52 mg/dL — ABNORMAL HIGH (ref 0.61–1.24)
GFR calc Af Amer: 10 mL/min — ABNORMAL LOW (ref >60)
GFR calc non Af Amer: 9 mL/min — ABNORMAL LOW (ref >60)
GLUCOSE: 195 mg/dL — AB (ref 65–99)
POTASSIUM: 4 mmol/L (ref 3.5–5.1)
Sodium: 139 mmol/L (ref 135–145)
TOTAL PROTEIN: 5.5 g/dL — AB (ref 6.5–8.1)

## 2016-10-25 LAB — HEMOGLOBIN AND HEMATOCRIT, BLOOD
HCT: 29.1 % — ABNORMAL LOW (ref 39.0–52.0)
HCT: 29.8 % — ABNORMAL LOW (ref 39.0–52.0)
HEMATOCRIT: 25.6 % — AB (ref 39.0–52.0)
HEMOGLOBIN: 9.7 g/dL — AB (ref 13.0–17.0)
Hemoglobin: 8.2 g/dL — ABNORMAL LOW (ref 13.0–17.0)
Hemoglobin: 9.5 g/dL — ABNORMAL LOW (ref 13.0–17.0)

## 2016-10-25 LAB — MAGNESIUM: Magnesium: 1.8 mg/dL (ref 1.7–2.4)

## 2016-10-25 LAB — BRAIN NATRIURETIC PEPTIDE: B NATRIURETIC PEPTIDE 5: 106 pg/mL — AB (ref 0.0–100.0)

## 2016-10-25 LAB — PREPARE RBC (CROSSMATCH)

## 2016-10-25 LAB — PHOSPHORUS: PHOSPHORUS: 4.8 mg/dL — AB (ref 2.5–4.6)

## 2016-10-25 LAB — GLUCOSE, CAPILLARY
GLUCOSE-CAPILLARY: 153 mg/dL — AB (ref 65–99)
GLUCOSE-CAPILLARY: 185 mg/dL — AB (ref 65–99)
Glucose-Capillary: 178 mg/dL — ABNORMAL HIGH (ref 65–99)
Glucose-Capillary: 147 mg/dL — ABNORMAL HIGH (ref 65–99)

## 2016-10-25 LAB — HEMOGLOBIN A1C
HEMOGLOBIN A1C: 8.2 % — AB (ref 4.8–5.6)
MEAN PLASMA GLUCOSE: 189 mg/dL

## 2016-10-25 SURGERY — COLONOSCOPY
Anesthesia: Moderate Sedation

## 2016-10-25 MED ORDER — MEPERIDINE HCL 100 MG/ML IJ SOLN
INTRAMUSCULAR | Status: DC | PRN
  Administered 2016-10-25: 25 mg via INTRAVENOUS

## 2016-10-25 MED ORDER — SODIUM CHLORIDE 0.9 % IV SOLN
Freq: Once | INTRAVENOUS | Status: AC
  Administered 2016-10-25: 10:00:00 via INTRAVENOUS

## 2016-10-25 MED ORDER — FUROSEMIDE 10 MG/ML IJ SOLN
40.0000 mg | Freq: Once | INTRAMUSCULAR | Status: AC
  Administered 2016-10-25: 40 mg via INTRAVENOUS
  Filled 2016-10-25: qty 4

## 2016-10-25 MED ORDER — LIDOCAINE VISCOUS 2 % MT SOLN
OROMUCOSAL | Status: DC | PRN
  Administered 2016-10-25: 1 via OROMUCOSAL

## 2016-10-25 MED ORDER — FUROSEMIDE 10 MG/ML IJ SOLN
20.0000 mg | Freq: Once | INTRAMUSCULAR | Status: AC
  Administered 2016-10-25: 20 mg via INTRAVENOUS
  Filled 2016-10-25: qty 2

## 2016-10-25 MED ORDER — SODIUM CHLORIDE 0.9 % IV SOLN
INTRAVENOUS | Status: DC
  Administered 2016-10-25: 1000 mL via INTRAVENOUS

## 2016-10-25 MED ORDER — ALBUTEROL SULFATE (2.5 MG/3ML) 0.083% IN NEBU
5.0000 mg | INHALATION_SOLUTION | Freq: Once | RESPIRATORY_TRACT | Status: AC
  Administered 2016-10-25: 5 mg via RESPIRATORY_TRACT
  Filled 2016-10-25: qty 6

## 2016-10-25 MED ORDER — MIDAZOLAM HCL 5 MG/5ML IJ SOLN
INTRAMUSCULAR | Status: DC | PRN
  Administered 2016-10-25: 1 mg via INTRAVENOUS
  Administered 2016-10-25: 2 mg via INTRAVENOUS
  Administered 2016-10-25: 1 mg via INTRAVENOUS

## 2016-10-25 MED ORDER — ONDANSETRON HCL 4 MG/2ML IJ SOLN
INTRAMUSCULAR | Status: AC
  Filled 2016-10-25: qty 2

## 2016-10-25 MED ORDER — MIDAZOLAM HCL 5 MG/5ML IJ SOLN
INTRAMUSCULAR | Status: AC
  Filled 2016-10-25: qty 10

## 2016-10-25 MED ORDER — FUROSEMIDE 10 MG/ML IJ SOLN: 40.0000 mg | Freq: Once | INTRAMUSCULAR | Status: DC

## 2016-10-25 MED ORDER — SODIUM CHLORIDE 0.9 % IV SOLN: INTRAVENOUS | Status: DC

## 2016-10-25 MED ORDER — IPRATROPIUM-ALBUTEROL 0.5-2.5 (3) MG/3ML IN SOLN: 3.0000 mL | Freq: Four times a day (QID) | RESPIRATORY_TRACT | Status: DC | PRN

## 2016-10-25 MED ORDER — LIDOCAINE VISCOUS 2 % MT SOLN
OROMUCOSAL | Status: AC
  Filled 2016-10-25: qty 15

## 2016-10-25 MED ORDER — STERILE WATER FOR IRRIGATION IR SOLN
Status: DC | PRN
  Administered 2016-10-25: 2.5 mL

## 2016-10-25 MED ORDER — MEPERIDINE HCL 100 MG/ML IJ SOLN
INTRAMUSCULAR | Status: AC
  Filled 2016-10-25: qty 2

## 2016-10-25 MED ORDER — IPRATROPIUM BROMIDE 0.02 % IN SOLN
0.5000 mg | Freq: Once | RESPIRATORY_TRACT | Status: AC
  Administered 2016-10-25: 0.5 mg via RESPIRATORY_TRACT

## 2016-10-25 NOTE — Progress Notes (Signed)
Tap water enema x2 given. Patient tolerated well. Patient releasing water with stool. Stool started as dark brown but ending up as a tan color. Patient still on bedpan for results. Patient also incontinent often.

## 2016-10-25 NOTE — Progress Notes (Signed)
John [name]  MRN: [id number]  DOB/AGE: 1931-04-01 83 y.o.  Primary Care Physician:[name], Betsy Coder, MD  Admit date: 10/24/2016  Chief Complaint:  Chief Complaint  Patient presents with  . Rectal Bleeding    S-Pt presented on  10/24/2016 with  Chief Complaint  Patient presents with  . Rectal Bleeding  .    Pt today feels better.    Pt says " I am okay"   Meds  . DULoxetine  60 mg Oral Daily  . finasteride  5 mg Oral q morning - 10a  . furosemide  20 mg Intravenous Once  . insulin aspart  0-15 Units Subcutaneous TID WC  . LORazepam  1 mg Oral Daily  . pantoprazole (PROTONIX) IV  40 mg Intravenous Q12H  . sodium bicarbonate  650 mg Oral BID  . tamsulosin  0.4 mg Oral QPM           Physical Exam: Vital signs in last 24 hours: Temp:  [97.5 F (36.4 C)-98.4 F (36.9 C)] 97.5 F (36.4 C) (04/29 5456) Pulse Rate:  [35-90] 90 (04/29 0632) Resp:  [12-18] 18 (04/29 2563) BP: (123-166)/(48-90) 154/61 (04/29 0632) SpO2:  [92 %-100 %] 98 % (04/29 8937) Weight:  [250 lb (113.4 kg)-255 lb 1.2 oz (115.7 kg)] 255 lb 1.2 oz (115.7 kg) (04/29 3428) Weight change:  Last BM Date: 10/24/16  Intake/Output from previous day: 04/28 0701 - 04/29 0700 In: 5385.9 [P.O.:3360; I.V.:670; Blood:1355.9] Out: 8 [Urine:3; Stool:5] No intake/output data recorded.   Physical Exam: General- pt is awake,alert, oriented to time place and person Resp- No acute REsp distress,decreased at bases. CVS- S1S2 regular in rate and rhythm GIT- BS+, soft, NT, ND EXT- 1+ LE Edema, NO  Cyanosis    Lab Results: CBC  Recent Labs  10/24/16 1308 10/25/16 0030 10/25/16 0705  WBC 12.9*  --  12.7*  HGB 7.5* 8.2* 8.1*  HCT 23.3* 25.6* 25.5*  PLT 305  --  277   hgb 11.3=>7.5=>8.1   BMET  Recent Labs  10/24/16 1308 10/25/16 0705  NA 142 139  K 4.3 4.0  CL 107 107  CO2 25 23  GLUCOSE 234* 195*  BUN 78* 68*  CREATININE 6.30* 5.52*  CALCIUM 8.6* 8.2*   Creat trend 2018  4.3=>5.1=>6.3=>5.5 2017 4.3  2016 3.1--4.0 2015 2.8--4.4 2014  2.1--2.6 2013  2.0 2012  1.6--2.1 2011  1.5--1.8 2010 1.3--1.5 2009  1.2     MICRO No results found for this or any previous visit (from the past 240 hour(s)).    Lab Results  Component Value Date   CALCIUM 8.2 (L) 10/25/2016   CAION 1.17 10/19/2012   PHOS 4.8 (H) 10/25/2016               Impression: 1)Renal  AKI secondary to Prerenal/ATN               AKI sec to Hypovolemia               AKI sec to GI bleed causing severe anemia               AKI on CKD               CKD stage 5.               CKD since 2009               CKD secondary to DM/ Age associated decline  Progression of CKD now marked with AKI                 Proteinura  Present                 AKI better                Creat trending down                        Need for renal replacement therapy has been discussed multiple times with pt and in presence of the family and   Pt has always maintained that he does NOT want to go on dialysis.  2)HTN  Medication-  On Diuretics as outpt On Beta blockers asoutpt Losartan was dced    3)Anemia HGb low Anemia of chronic ds Admitted with GI bleed  4)CKD Mineral-Bone Disorder PTH elevated.Secondary Hyperparathyroidism present. Phosphorus at goal. Vitamin d low- on replacement   5)BPH- Primary  MD following  6)Electrolytes Normokalemic NOrmonatremic   7)Acid base Co2 at goal  8) DM- Pt has hx of DM. Being followed by Primary team .     Plan:  Will suggest to give lasix 40mg  IV in am as pt received 4 units so far.  Will follow BMET.      BHUTANI,MANPREET S 10/25/2016, 9:01 AM

## 2016-10-25 NOTE — Op Note (Addendum)
[facility] Patient Name: John [name] Procedure Date: 10/25/2016 2:17 PM MRN: [id number] Date of Birth: 07-08-1930 Attending MD: Norvel Richards , MD CSN: [id number] Age: 83 Admit Type: Inpatient Procedure:                Ileo-colonoscopy Indications:              Hematochezia Providers:                Norvel Richards, MD, Charlyne Petrin RN, RN,                            Randa Spike, Technician Referring MD:             Halford Chessman MD, MD Medicines:                Midazolam 4 mg IV, Meperidine 25 mg IV Complications:            No immediate complications. Estimated Blood Loss:     Estimated blood loss: none. Procedure:                Pre-Anesthesia Assessment:                           - Prior to the procedure, a History and Physical                            was performed, and patient medications and                            allergies were reviewed. The patient's tolerance of                            previous anesthesia was also reviewed. The risks                            and benefits of the procedure and the sedation                            options and risks were discussed with the patient.                            All questions were answered, and informed consent                            was obtained. Prior Anticoagulants: The patient has                            taken no previous anticoagulant or antiplatelet                            agents. ASA Grade Assessment: IV - A patient with                            severe systemic disease that is a constant threat  to life. After reviewing the risks and benefits,                            the patient was deemed in satisfactory condition to                            undergo the procedure.                           After obtaining informed consent, the colonoscope                            was passed under direct vision. Throughout the                             procedure, the patient's blood pressure, pulse, and                            oxygen saturations were monitored continuously. The                            EC-[id number] ([id number]) scope was introduced through                            the anus and advanced to the 30 cm into the ileum.                            The colonoscopy was performed without difficulty.                            The patient tolerated the procedure well. The                            quality of the bowel preparation was adequate. The                            terminal ileum, ileocecal valve, appendiceal                            orifice, and rectum were photographed. The patient                            tolerated the procedure well. The entire colon was                            well visualized. Scope In: 2:46:36 PM Scope Out: 3:11:54 PM Scope Withdrawal Time: 0 hours 15 minutes 46 seconds  Total Procedure Duration: 0 hours 25 minutes 18 seconds  Findings:      The perianal and digital rectal examinations were normal.      The colonic mucosa appeared normal. There was scattered blood clots       throughout the colon. No fresh blood. Normal-appearing mucosa. The       distal terminal ileum was intubated a good 30 cm. There was no  blo. The       ileal mupeared normal.      Internal hemorrhoids were found during retroflexion. The hemorrhoids       were Grade I (internal hemorrhoids that do not prolapse).      The exam was otherwise without abnormality on direct and retroflexion       views. Impression:               - The entire examined colon is normal. Small                            amountut the colon No bleeding lesions seen.                            Normal-appearing terminal ileum.                           - Internal hemorrhoids.                           - The examination was otherwise normal on direct                            and retroflexion views.                           - No specimens  collected. I suspect occult AVMs in                            the setting of renal failure as the cause of                            bleeding. See EGD report. Moderate Sedation:      Moderate (conscious) sedation was administered by the endoscopy nurse       and supervised by the endoscopist. The following parameters were       monitored: oxygen saturation, heart rate, blood pressure, respiratory       rate, EKG, adequacy of pulmonary ventilation, and response to care.       Total physician intraservice time was 28 minutes. Recommendation:           - Return patient to hospital ward for observation.                           - clear liquid diet.                           - No repeat colonoscopy. Procedure Code(s):        --- Professional ---                           [phone], Colonoscopy, flexible; diagnostic, including                            collection of specimen(s) by brushing or washing,  when performed (separate procedure)                           99152, Moderate sedation services provided by the                            same physician or other qualified health care                            professional performing the diagnostic or                            therapeutic service that the sedation supports,                            requiring the presence of an independent trained                            observer to assist in the monitoring of the                            patient's level of consciousness and physiological                            status; initial 15 minutes of intraservice time,                            patient age 26 years or older                           [phone], Moderate sedation services; each additional                            15 minutes intraservice time Diagnosis Code(s):        --- Professional ---                           K64.0, First degree hemorrhoids                           K92.1, Melena (includes  Hematochezia) CPT copyright 2016 [employer]. All rights reserved. The codes documented in this report are preliminary and upon coder review may  be revised to meet current compliance requirements. John Estimable. Athanasius Kesling, MD Norvel Richards, MD 10/25/2016 3:26:32 PM This report has been signed electronically. Number of Addenda: 0

## 2016-10-25 NOTE — Op Note (Signed)
[facility] Patient Name: John [name] Procedure Date: 10/25/2016 3:12 PM MRN: [id number] Date of Birth: 1931-03-26 Attending MD: Norvel Richards , MD CSN: [id number] Age: 83 Admit Type: Inpatient Procedure:                Upper GI endoscopy -daiagnostic Indications:              Melena Providers:                Norvel Richards, MD, Charlyne Petrin RN, RN,                            Randa Spike, Technician Referring MD:             Halford Chessman MD, MD Medicines:                Midazolam 4 mg IV, Meperidine 25 mg IV, Ondansetron                            4 mg IV Complications:            No immediate complications. Estimated Blood Loss:     Estimated blood loss: none. Procedure:                Pre-Anesthesia Assessment:                           - Prior to the procedure, a History and Physical                            was performed, and patient medications and                            allergies were reviewed. The patient's tolerance of                            previous anesthesia was also reviewed. The risks                            and benefits of the procedure and the sedation                            options and risks were discussed with the patient.                            All questions were answered, and informed consent                            was obtained. Prior Anticoagulants: The patient has                            taken no previous anticoagulant or antiplatelet                            agents. ASA Grade Assessment: IV - A patient with  severe systemic disease that is a constant threat                            to life. After reviewing the risks and benefits,                            the patient was deemed in satisfactory condition to                            undergo the procedure.                           After obtaining informed consent, the endoscope was                            passed under  direct vision. Throughout the                            procedure, the patient's blood pressure, pulse, and                            oxygen saturations were monitored continuously. The                            EG-[id number] ([id number]) scope was introduced through the                            mouth, and advanced to the third part of duodenum.                            The upper GI endoscopy was accomplished without                            difficulty. The patient tolerated the procedure                            well. The upper GI endoscopy was accomplished                            without difficulty. The patient tolerated the                            procedure well. Scope In: 3:19:23 PM Scope Out: 3:24:21 PM Total Procedure Duration: 0 hours 4 minutes 58 seconds  Findings:      The examined esophagus was normal.      The entire examined stomach was normal aside from a benign-appearg       antral nodule..      The duodenal bulb, first portion of the duodenum and third portion of       the duodenum were normal. No blood in the upper GI tract. Impression:               - Normal esophagus.                           -  Normal stomach. Small benign-appearin nodule.                           - Normal duodenal bulb, first portion of the                            duodenum and third portion of the duodenum.                           - No specimens collected. I suspect recent bleeding                            from more distal GI tract AVMs. See colonoscop                            report. Moderate Sedation:      Moderate (conscious) sedation was administered by the endoscopy nurse       and supervised by the endoscopist. The following parameters were       monitored: oxygen saturation, heart rate, blood pressure, respiratory       rate, EKG, adequacy of pulmonary ventilation, and response to care.       Total physician intraservice time was 41 minutes. Recommendation:           -  Return patient to hospital ward for observation.                           - Clear liquid diet. Follow clinically.Follow H&H.                           - Continue present medications.                           - No repeat upper endoscopy. Procedure Code(s):        --- Professional ---                           [phone], Esophagogastroduodenoscopy, flexible,                            transoral; diagnostic, including collection of                            specimen(s) by brushing or washing, when performed                            (separate procedure)                           99152, Moderate sedation services provided by the                            same physician or other qualified health care                            professional performing the diagnostic or  therapeutic service that the sedation supports,                            requiring the presence of an independent trained                            observer to assist in the monitoring of the                            patient's level of consciousness and physiological                            status; initial 15 minutes of intraservice time,                            patient age 54 years or older                           [phone], Moderate sedation services; each additional                            15 minutes intraservice time                           99153, Moderate sedation services; each additional                            15 minutes intraservice time Diagnosis Code(s):        --- Professional ---                           K92.1, Melena (includes Hematochezia) CPT copyright 2016 American Medical Association. All rights reserved. The codes documented in this report are preliminary and upon coder review may  be revised to meet current compliance requirements. John [name]. John Locicero, MD Norvel Richards, MD 10/25/2016 3:32:12 PM This report has been signed electronically. Number of Addenda: 0

## 2016-10-25 NOTE — Progress Notes (Signed)
PROGRESS NOTE    John [name]  OHY:[id number] DOB: 1931-02-18 DOA: 10/24/2016 PCP: Purvis Kilts, MD  Brief Narrative: John [name] is a 83 y.o. male with medical history significant of HTN, HLD, DM, Chronic Diastolic CHF, CKD Stage 5 (Refusing Dialysis), Hx of GI Bleed, GERD and other comorbids who presented to [facility] with a CC of Rectal Bleeding weakness and fall. Patient states he was in his usual state of health and went to see his Nephrologist on Wednesday who recommended dialysis but patient refused. He states after his appointment he went to [place] in [place] and came back and had a feeling of pressure that he had to urinate and when he went to the bathroom had a dark colored stool. Patient states he has been having dark and bloody stools ever since Wednesday night and on 10/24/16 AM he woke up to go have a bowel movement and it was pure blood. Patient got up from the toilet and started ambulating back to the bedroom but became extremely weak and slid down (not hitting his head) and hit his life alert button. After several minutes the life alert wasn't working so patient got up from the floor and reached the phone and dialed 911. EMS came to get the patient and brought the patient in to [facility] for evaluation. Patient admits to being incontinent more than usual and states he gets nauseous more and takes Zofran for his nausea. He was admitted for GIB and Gastroenterology was consulted and patient to undergo colonoscopy today. He may also have EGD but that will be determined by Gastroenterology. Nephrology was also consulted for his AKI on CKD Stage 5.   Assessment & Plan:   Active Problems:   Type 2 diabetes mellitus with hyperlipidemia (HCC)   Anemia   Essential hypertension   GERD   Melena   CKD (chronic kidney disease), stage IV (HCC)   BPH (benign prostatic hyperplasia)   DM type 2 (diabetes mellitus, type 2) (HCC)   GI bleed   Chronic diastolic congestive heart failure  (HCC)   Bright red rectal bleeding   Anemia due to chronic kidney disease   Acute kidney injury superimposed on chronic kidney disease (HCC)   Leukocytosis  Acute Rectal Bleeding/Lower GI Bleed possibly from AVM r/o Other Etiologies -Admitted to Telemetry -Type and Screen and being Transfused 2 units of pRBC; Will transfuse another 2 units of pRBC's as patient has not had appropriate response to blood -Gastroenterology Dr. Cammie Sickle by EDP and appreciate Recc's -Was given 4,000 mL of Golytely -NPO this AM for Colonoscopy this AM and possible Diagnostic EGD -Hb/Hct went from 7.5/23.3 -> 8.1/25.5 after transfusion of 2 units of pRBC's; will transfuse another 2 units -2 Large Bore IV's -C/w IV Protonix 40 mg po BID -D/C'd NS at 75 mL/hr because will transfuse 2 more units of blood  -Hold ASA -Patient is not on any blood thinners -Has had Hx of GIB that was worked up in 2012 and has Hx of AVM -Monitor H/H q6h (Hb/Hct going from 7.5/23.3 -> 8.2/25.6 -> 8.1/25.5) -Repeat CBC in AM  Acute on Chronic Anemia in the Setting of GIB s/p transfusion of 2 units of pRBC's  -Has Anemia of Chronic Disease from CKD -Hb/Hct on Admission was 7.5/23.3 and barely improved to 8.1/25.5 after 2 units of pRBC's -Continue to Monitor H/H q6h -Repeat CBC in AM -Will transfuse another 2 units of pRBC's with 20 mg of IV lasix before and between units  Acute on Chronic Kidney Disease Stage 5 -BUN/Cr was 78/6.30 and improved to 68/5.52 -Discussed with patient's Nephrologist Dr. Theador Hawthorne and he last saw patient on Wednesday 10/21/16 and Cr then was 5.1 -Gentle IVF at 75 mL/hr now D/C'd as patient will be getting another 2 units of blood. Will give IV Lasix 20 mg before and between units -Hold Nephrotixic Medications including Losartan 25 mg po Daily and Furosemide 40 mg po Daily -C/w Sodium Bicarbonate 650 mg po BID -Appreciate Nephrology Evaluation and Recommendations -Per Patient he is opposed to going on  Dialysis and has made it very evident   Leukocytosis -Likely reactive to GIB -WBC was 12.9 and went to 12.7 -No Current S/Sx of Infection but will pan Cx if worsening  -Repeat CBC in AM  Mild Hyperphosphatemia -Likely from CKD -Phos Level was 4.8 -Nephrology following and continue to Monitor  BPH -C/w Finasteride 5 mg po q Daily and with Tamsulosin 0.4 mg po qHS -Will place Condom Cath for incontinence   Hypertension -Will hold Antihypertensives in the Setting of GIB including Amlodipine 7.5 mg po every morning, Losartan 25 mg po Daily, and Terazosn 10 mg po qHS, and Nebivolol 10 mg po Daily -Continue to Monitor BP's; This AM was 170/67 -Given IV Lasix this AM -C/w Tamsulosin 0.4 and Finasteride 5 mg po Daily   Hyperlipidemia -Hold Choline Fenofibrate 135 mg po qhS  Hyperglycemia in the setting of Diabetes Mellitus Type 2 -Takes Humalog 75/25 Mix with 60 units in AM and 65 Units qHS -Place on Moderate Novolog SSI -Continue to Monitor CBG's; Ranging from 178-182 -Obtain HbA1c ordered and pending -Last HbA1c was 8.5  Chronic Diastolic CHF -Has some swelling in legs but appears to be compensated -Check BNP (pending) (Last value in 12/26/14 was 38.0) -Held po Lasix given risk of hypovolemia from GIB but now that patient is getting 4 units total of blood will give IV 20 mg prior to transfusion and in between transfusion -ECHO done last in 2016 showed EF of 60-65% with Grade 1 Diastolic Dysfunction -Continue to Monitor Strict I's and O's -Assess Volume Status in AM   DVT prophylaxis: SCDs Code Status: FULL CODE Family Communication: No Family present at bedside Disposition Plan: Pending Clinical Course; Possible SNF  Consultants:   Gastroenterology Dr. Gala Romney  Nephrology Dr. Theador Hawthorne   Procedures: Colonoscopy this AM  Antimicrobials:  Anti-infectives    None     Subjective: Seen and examined and stated he was doing fair. States he drank all his Golytely.  Denied and CP or SOB. No nausea and was extremely hungry.   Objective: Vitals:   10/24/16 2200 10/25/16 0632 10/25/16 0954 10/25/16 1013  BP: (!) 156/65 (!) 154/61 (!) 134/44 (!) 170/67  Pulse: 88 90 90 89  Resp: 18 18 18 16   Temp: 98.4 F (36.9 C) 97.5 F (36.4 C) 98 F (36.7 C) 98.3 F (36.8 C)  TempSrc: Oral Oral Oral Oral  SpO2: 98% 98% 93% 94%  Weight:  115.7 kg (255 lb 1.2 oz)    Height:        Intake/Output Summary (Last 24 hours) at 10/25/16 1037 Last data filed at 10/25/16 0958  Gross per 24 hour  Intake           5720.9 ml  Output                8 ml  Net           5712.9 ml   [employer]  10/24/16 1255 10/24/16 1824 10/25/16 1025  Weight: 113.4 kg (250 lb) 113.4 kg (250 lb) 115.7 kg (255 lb 1.2 oz)   Examination: Physical Exam:  Constitutional: WN/WD, NAD and appears calm and comfortable laying in bed this AM resting Eyes: Lids and conjunctivae normal, sclerae anicteric  ENMT: Has multiple skin lesions on face and one on left nare and left side of nose appearing to be skin cancer. Grossly normal hearing. Neck: Appears normal, supple, no cervical masses, normal ROM, no appreciable thyromegaly, no JVD Respiratory: Diminished to auscultation bilaterally, no wheezing, rales, rhonchi or crackles. Normal respiratory effort and patient is not tachypenic. No accessory muscle use.  Cardiovascular: RRR, no murmurs / rubs / gallops. S1 and S2 auscultated. 1+ Lower Extremity Edema Abdomen: Soft, non-tender, non-distended. No masses palpated. No appreciable hepatosplenomegaly. Bowel sounds positive x4.  GU: Deferred. Musculoskeletal: No clubbing / cyanosis of digits/nails. No joint deformity upper and lower extremities. Skin: Multiple upper extremity and abdomen skin lesions and seborrheic keratosis noted. No rashes noted. Skin warm and dry.  Neurologic: CN 2-12 grossly intact with no focal deficits. Strength 5/5 in all 4. Romberg sign cerebellar reflexes not assessed.    Psychiatric: Normal judgment and insight. Alert and oriented x 3. Normal mood and appropriate affect.   Data Reviewed: I have personally reviewed following labs and imaging studies  CBC:  Recent Labs Lab 10/24/16 1308 10/25/16 0030 10/25/16 0705  WBC 12.9*  --  12.7*  NEUTROABS 10.4*  --  9.8*  HGB 7.5* 8.2* 8.1*  HCT 23.3* 25.6* 25.5*  MCV 91.4  --  87.0  PLT 305  --  852   Basic Metabolic Panel:  Recent Labs Lab 10/24/16 1308 10/25/16 0705  NA 142 139  K 4.3 4.0  CL 107 107  CO2 25 23  GLUCOSE 234* 195*  BUN 78* 68*  CREATININE 6.30* 5.52*  CALCIUM 8.6* 8.2*  MG  --  1.8  PHOS  --  4.8*   GFR: Estimated Creatinine Clearance: 12.3 mL/min (A) (by C-G formula based on SCr of 5.52 mg/dL (H)). Liver Function Tests:  Recent Labs Lab 10/24/16 1308 10/25/16 0705  AST 16 18  ALT 14* 13*  ALKPHOS 36* 35*  BILITOT 0.4 0.5  PROT 5.8* 5.5*  ALBUMIN 3.0* 2.9*    Recent Labs Lab 10/24/16 1308  LIPASE 30   No results for input(s): AMMONIA in the last 168 hours. Coagulation Profile:  Recent Labs Lab 10/24/16 1308  INR 1.05   Cardiac Enzymes:  Recent Labs Lab 10/24/16 1308  TROPONINI <0.03   BNP (last 3 results) No results for input(s): PROBNP in the last 8760 hours. HbA1C: No results for input(s): HGBA1C in the last 72 hours. CBG:  Recent Labs Lab 10/24/16 2244 10/25/16 0734  GLUCAP 182* 178*   Lipid Profile: No results for input(s): CHOL, HDL, LDLCALC, TRIG, CHOLHDL, LDLDIRECT in the last 72 hours. Thyroid Function Tests: No results for input(s): TSH, T4TOTAL, FREET4, T3FREE, THYROIDAB in the last 72 hours. Anemia Panel: No results for input(s): VITAMINB12, FOLATE, FERRITIN, TIBC, IRON, RETICCTPCT in the last 72 hours. Sepsis Labs: No results for input(s): PROCALCITON, LATICACIDVEN in the last 168 hours.  No results found for this or any previous visit (from the past 240 hour(s)).   Radiology Studies: Dg Chest Portable 1  View  Result Date: 10/24/2016 CLINICAL DATA:  Gastrointestinal bleeding. Recent fall. Shortness of breath. EXAM: PORTABLE CHEST 1 VIEW COMPARISON:  Nov 19, 2015 FINDINGS: There is no edema or  consolidation. Heart is upper normal in size with pulmonary vascularity within normal limits. There is aortic atherosclerosis. No pneumothorax. No bone lesions. No adenopathy. IMPRESSION: Aortic atherosclerosis.  No edema or consolidation. Electronically Signed   By: Lowella Grip III M.D.   On: 10/24/2016 13:28   Scheduled Meds: . lidocaine      . meperidine      . midazolam      . ondansetron      . DULoxetine  60 mg Oral Daily  . finasteride  5 mg Oral q morning - 10a  . furosemide  20 mg Intravenous Once  . furosemide  20 mg Intravenous Once  . insulin aspart  0-15 Units Subcutaneous TID WC  . LORazepam  1 mg Oral Daily  . pantoprazole (PROTONIX) IV  40 mg Intravenous Q12H  . sodium bicarbonate  650 mg Oral BID  . tamsulosin  0.4 mg Oral QPM   Continuous Infusions: . sodium chloride    . sodium chloride    . sodium chloride      LOS: 1 day   Kerney Elbe, DO [facility] Pager [phone]  If 7PM-7AM, please contact night-coverage [email or web address] Password [facility] 10/25/2016, 10:37 AM

## 2016-10-25 NOTE — Progress Notes (Addendum)
Schorr, NP paged for notification of patient's labored breathing, expiratory wheezes, Resp 20, and SpO2 95%. Patient denies SOB at this time, but using abdomen to breath while resting in bed. Patient assisted to reposition higher in the bed and HOB elevated.

## 2016-10-26 DIAGNOSIS — R410 Disorientation, unspecified: Secondary | ICD-10-CM

## 2016-10-26 DIAGNOSIS — I5033 Acute on chronic diastolic (congestive) heart failure: Secondary | ICD-10-CM

## 2016-10-26 DIAGNOSIS — R531 Weakness: Secondary | ICD-10-CM

## 2016-10-26 DIAGNOSIS — Q2730 Arteriovenous malformation, site unspecified: Secondary | ICD-10-CM

## 2016-10-26 DIAGNOSIS — K922 Gastrointestinal hemorrhage, unspecified: Secondary | ICD-10-CM

## 2016-10-26 LAB — COMPREHENSIVE METABOLIC PANEL
ALK PHOS: 37 U/L — AB (ref 38–126)
ALT: 16 U/L — ABNORMAL LOW (ref 17–63)
ALT: 19 U/L (ref 17–63)
ANION GAP: 9 (ref 5–15)
AST: 25 U/L (ref 15–41)
AST: 33 U/L (ref 15–41)
Albumin: 3 g/dL — ABNORMAL LOW (ref 3.5–5.0)
Albumin: 3.2 g/dL — ABNORMAL LOW (ref 3.5–5.0)
Alkaline Phosphatase: 43 U/L (ref 38–126)
Anion gap: 9 (ref 5–15)
BILIRUBIN TOTAL: 0.5 mg/dL (ref 0.3–1.2)
BUN: 60 mg/dL — ABNORMAL HIGH (ref 6–20)
BUN: 59 mg/dL — AB (ref 6–20)
CALCIUM: 8.4 mg/dL — AB (ref 8.9–10.3)
CHLORIDE: 106 mmol/L (ref 101–111)
CO2: 25 mmol/L (ref 22–32)
CO2: 25 mmol/L (ref 22–32)
Calcium: 8.7 mg/dL — ABNORMAL LOW (ref 8.9–10.3)
Chloride: 106 mmol/L (ref 101–111)
Creatinine, Ser: 4.77 mg/dL — ABNORMAL HIGH (ref 0.61–1.24)
Creatinine, Ser: 4.83 mg/dL — ABNORMAL HIGH (ref 0.61–1.24)
GFR calc Af Amer: 12 mL/min — ABNORMAL LOW (ref >60)
GFR calc Af Amer: 12 mL/min — ABNORMAL LOW (ref >60)
GFR, EST NON AFRICAN AMERICAN: 10 mL/min — AB (ref >60)
GFR, EST NON AFRICAN AMERICAN: 10 mL/min — AB (ref >60)
GLUCOSE: 166 mg/dL — AB (ref 65–99)
Glucose, Bld: 136 mg/dL — ABNORMAL HIGH (ref 65–99)
POTASSIUM: 3.7 mmol/L (ref 3.5–5.1)
POTASSIUM: 3.7 mmol/L (ref 3.5–5.1)
SODIUM: 140 mmol/L (ref 135–145)
Sodium: 140 mmol/L (ref 135–145)
TOTAL PROTEIN: 5.6 g/dL — AB (ref 6.5–8.1)
Total Bilirubin: 0.7 mg/dL (ref 0.3–1.2)
Total Protein: 6 g/dL — ABNORMAL LOW (ref 6.5–8.1)

## 2016-10-26 LAB — BPAM RBC
BLOOD PRODUCT EXPIRATION DATE: 201805152359
BLOOD PRODUCT EXPIRATION DATE: 201805202359
Blood Product Expiration Date: 201805112359
Blood Product Expiration Date: 201805202359
ISSUE DATE / TIME: 201804281847
ISSUE DATE / TIME: 201804281515
ISSUE DATE / TIME: 201804290944
ISSUE DATE / TIME: 201804291615
UNIT TYPE AND RH: 6200
UNIT TYPE AND RH: 6200
UNIT TYPE AND RH: 6200
Unit Type and Rh: 6200

## 2016-10-26 LAB — TYPE AND SCREEN
ABO/RH(D): A POS
Antibody Screen: NEGATIVE
UNIT DIVISION: 0
Unit division: 0
Unit division: 0
Unit division: 0

## 2016-10-26 LAB — CBC WITH DIFFERENTIAL/PLATELET
Basophils Absolute: 0 10*3/uL (ref 0.0–0.1)
Basophils Relative: 0 %
Eosinophils Absolute: 0.3 10*3/uL (ref 0.0–0.7)
Eosinophils Relative: 3 %
HCT: 29 % — ABNORMAL LOW (ref 39.0–52.0)
Hemoglobin: 9.5 g/dL — ABNORMAL LOW (ref 13.0–17.0)
Lymphocytes Relative: 13 %
Lymphs Abs: 1.5 10*3/uL (ref 0.7–4.0)
MCH: 28.1 pg (ref 26.0–34.0)
MCHC: 32.8 g/dL (ref 30.0–36.0)
MCV: 85.8 fL (ref 78.0–100.0)
Monocytes Absolute: 0.9 10*3/uL (ref 0.1–1.0)
Monocytes Relative: 8 %
Neutro Abs: 8.8 10*3/uL — ABNORMAL HIGH (ref 1.7–7.7)
Neutrophils Relative %: 76 %
Platelets: 280 10*3/uL (ref 150–400)
RBC: 3.38 MIL/uL — ABNORMAL LOW (ref 4.22–5.81)
RDW: 18.2 % — ABNORMAL HIGH (ref 11.5–15.5)
WBC: 11.5 10*3/uL — ABNORMAL HIGH (ref 4.0–10.5)

## 2016-10-26 LAB — GLUCOSE, CAPILLARY
GLUCOSE-CAPILLARY: 164 mg/dL — AB (ref 65–99)
GLUCOSE-CAPILLARY: 129 mg/dL — AB (ref 65–99)
GLUCOSE-CAPILLARY: 141 mg/dL — AB (ref 65–99)
Glucose-Capillary: 175 mg/dL — ABNORMAL HIGH (ref 65–99)

## 2016-10-26 LAB — PHOSPHORUS: Phosphorus: 5.1 mg/dL — ABNORMAL HIGH (ref 2.5–4.6)

## 2016-10-26 LAB — MAGNESIUM: Magnesium: 1.8 mg/dL (ref 1.7–2.4)

## 2016-10-26 MED ORDER — IPRATROPIUM-ALBUTEROL 0.5-2.5 (3) MG/3ML IN SOLN
3.0000 mL | Freq: Four times a day (QID) | RESPIRATORY_TRACT | Status: DC
  Administered 2016-10-26 – 2016-10-27 (×3): 3 mL via RESPIRATORY_TRACT
  Filled 2016-10-26 (×5): qty 3

## 2016-10-26 MED ORDER — PANTOPRAZOLE SODIUM 40 MG PO TBEC
40.0000 mg | DELAYED_RELEASE_TABLET | Freq: Two times a day (BID) | ORAL | Status: DC
  Administered 2016-10-26 – 2016-10-28 (×4): 40 mg via ORAL
  Filled 2016-10-26 (×4): qty 1

## 2016-10-26 MED ORDER — FUROSEMIDE 80 MG PO TABS
80.0000 mg | ORAL_TABLET | Freq: Two times a day (BID) | ORAL | Status: DC
  Administered 2016-10-26 (×2): 80 mg via ORAL
  Filled 2016-10-26 (×2): qty 1

## 2016-10-26 NOTE — Clinical Social Work Note (Signed)
Clinical Social Work Assessment  Patient Details  Name: John [name] MRN: [id number] Date of Birth: Jul 26, 1930  Date of referral:  10/26/16               Reason for consult:  Facility Placement, Discharge Planning                Permission sought to share information with:  Case Manager, Customer service manager, Family Supports Permission granted to share information::  Yes, Verbal Permission Granted  Name::        Agency::     Relationship::  Daughter Market researcher Information:     Housing/Transportation Living arrangements for the past 2 months:  [place] of Information:  Patient, Medical Team, Case Manager, Adult Children Patient Interpreter Needed:  None Criminal Activity/Legal Involvement Pertinent to Current Situation/Hospitalization:  No - Comment as needed Significant Relationships:  Adult Children, Other Family Members Lives with:  Self Do you feel safe going back to the place where you live?  No Need for family participation in patient care:  Yes (Comment)  Care giving concerns:  LCSW spoke with patient's daughter regarding dc plans as patient is oriented to self only and confused. His typical baseline remains independent at home with help from adult children and previously home health and [facility].  Reports patient does have memory decline, but currently his confusion is worse which daughter feels is secondary to his progression with kidney function/disease or GI related issues.   Daughter reports patient has been to SNF in the past [facility] and [facility] in which he did not like either and left AMA.  reports she would not be agreeable to facilities.  Feels patient would benefit also with possible hospice involvement in which LCSW explained can happen at the facility or at home to establish goals of care.   Social Worker assessment / plan:  Assessment completed as consult was for new snf placement. Daughter agreeable for work up and bed options,  but not fully committed to disposition as she wants to see how patient progresses. She is also open to home health (used [facility] last admission) if patient clears and can go home.  SNF work up completed and will follow up with bed offers as it applies.  Employment status:  Retired Forensic scientist:  Medicare PT Recommendations:  [place] / Referral to community resources:  [place]  Patient/Family's Response to care:  Agreeable to plan  Patient/Family's Understanding of and Emotional Response to Diagnosis, Current Treatment, and Prognosis:  Daughter is understanding of progression of disease and current symptoms.    Emotional Assessment Appearance:  Appears stated age Attitude/Demeanor/Rapport:  Apprehensive Affect (typically observed):  Other (Confused) Orientation:  Oriented to Self Alcohol / Substance use:  Not Applicable Psych involvement (Current and /or in the community):  No (Comment)  Discharge Needs  Concerns to be addressed:  No discharge needs identified Readmission within the last 30 days:  No Current discharge risk:  None Barriers to Discharge:  No Barriers Identified, Continued Medical Work up   Lilly [place], LCSW 10/26/2016, 2:08 PM

## 2016-10-26 NOTE — [place] FL2 (Signed)
[place] MEDICAID FL2 LEVEL OF CARE SCREENING TOOL     IDENTIFICATION  Patient Name: John [name] Birthdate: 02/20/1931 Sex: male Admission Date (Current Location): 10/24/2016  [facility] and [place] Number:  [employer] and Address:  [place] [address], [place]      Provider Number: [id number]  Attending Physician Name and Address:  Kerney Elbe, DO  Relative Name and Phone Number:       Current Level of Care: Hospital Recommended Level of Care: [place] Prior Approval Number:    Date Approved/Denied:   PASRR Number:   [id number]  Discharge Plan: SNF    Current Diagnoses: Patient Active Problem List   Diagnosis Date Noted  . Bright red rectal bleeding 10/24/2016  . Anemia due to chronic kidney disease 10/24/2016  . Acute kidney injury superimposed on chronic kidney disease ([place]) 10/24/2016  . Leukocytosis 10/24/2016  . HCAP (healthcare-associated pneumonia) 12/26/2014  . Chronic diastolic congestive heart failure ([place]) 12/26/2014  . CKD (chronic kidney disease) stage 4, GFR 15-29 ml/min (HCC) 12/26/2014  . HLD (hyperlipidemia) 12/26/2014  . Failure to thrive in adult 11/25/2014  . Acute renal failure superimposed on stage 4 chronic kidney disease ([place]) 02/12/2014  . GI bleed 02/12/2014  . Esophageal dysphagia 05/02/2013  . CAP (community acquired pneumonia) 10/20/2012  . Acute respiratory failure with hypoxia ([place]) 10/20/2012  . DM type 2 (diabetes mellitus, type 2) ([place]) 10/19/2012  . Dyspnea 10/19/2012  . Acute on chronic diastolic CHF (congestive heart failure) ([place]) 10/19/2012  . Upper abdominal pain 04/06/2012  . Left flank pain 04/06/2012  . Groin pain 04/06/2012  . SOB (shortness of breath) 04/06/2012  . Physical deconditioning 02/18/2011  . Pyelonephritis 02/13/2011  . Weakness generalized 02/13/2011  . Fall 02/13/2011  . CKD (chronic kidney disease), stage IV ([place]) 02/13/2011  . BPH  (benign prostatic hyperplasia) 02/13/2011  . Abdominal pain, acute, left lower quadrant 10/17/2010  . Constipation 10/17/2010  . Melena 10/17/2010  . Anemia 04/25/2010  . COLONIC POLYPS, HX OF 03/04/2010  . Type 2 diabetes mellitus with hyperlipidemia ([place]) 02/27/2010  . EXOGENOUS OBESITY 02/27/2010  . Essential hypertension 02/27/2010  . CVA 02/27/2010  . GERD 02/27/2010  . GASTROINTESTINAL HEMORRHAGE, HX OF 02/27/2010    Orientation RESPIRATION BLADDER Height & Weight     Self, Place  Normal Incontinent Weight: 249 lb 1.9 oz (113 kg) Height:  5\' 7"  (170.2 cm)  BEHAVIORAL SYMPTOMS/MOOD NEUROLOGICAL BOWEL NUTRITION STATUS      Incontinent Diet (see dc summary, advancing diet)  AMBULATORY STATUS COMMUNICATION OF NEEDS Skin   Extensive Assist Verbally Normal                       Personal Care Assistance Level of Assistance  Bathing, Feeding, Dressing Bathing Assistance: Limited assistance Feeding assistance: Limited assistance Dressing Assistance: Limited assistance     Functional Limitations Info  Sight, Hearing, Speech Sight Info: Adequate Hearing Info: Adequate Speech Info: Adequate    SPECIAL CARE FACTORS FREQUENCY  PT (By licensed PT), OT (By licensed OT)     PT Frequency: 5x OT Frequency: 5x            Contractures Contractures Info: Not present    Additional Factors Info  Code Status, Allergies Code Status Info: Full Code Allergies Info: [place] Aspirin, Latex, Propoxyphene, Sulfa Antibiotics, Betadine Povidone Iodine           Current Medications (10/26/2016):  This is the current hospital active medication list Current Facility-Administered Medications  Medication Dose Route Frequency Provider Last Rate Last Dose  . 0.9 %  sodium chloride infusion  10 mL/hr Intravenous Once Isla Pence, MD      . 0.9 %  sodium chloride infusion   Intravenous Continuous Daneil Dolin, MD   Stopped at 10/25/16 1541  . acetaminophen (TYLENOL) tablet 650 mg   650 mg Oral Q6H PRN Bertram Savin Sheikh, DO   650 mg at 10/26/16 2563   Or  . acetaminophen (TYLENOL) suppository 650 mg  650 mg Rectal Q6H PRN Kerney Elbe, DO      . DULoxetine (CYMBALTA) DR capsule 60 mg  60 mg Oral Daily [facility], DO   60 mg at 10/26/16 0818  . finasteride (PROSCAR) tablet 5 mg  5 mg Oral q morning - 10a Omair Latif Sheikh, DO   5 mg at 10/26/16 8937  . furosemide (LASIX) tablet 80 mg  80 mg Oral BID [place], DO   80 mg at 10/26/16 1110  . insulin aspart (novoLOG) injection 0-15 Units  0-15 Units Subcutaneous TID WC Kerney Elbe, DO   2 Units at 10/26/16 1144  . ipratropium-albuterol (DUONEB) 0.5-2.5 (3) MG/3ML nebulizer solution 3 mL  3 mL Nebulization Q6H [place], DO      . LORazepam (ATIVAN) tablet 1 mg  1 mg Oral Daily Omair Latif Sheikh, DO   1 mg at 10/26/16 0818  . ondansetron (ZOFRAN) tablet 4 mg  4 mg Oral Q6H PRN Kerney Elbe, DO       Or  . ondansetron [facility]) injection 4 mg  4 mg Intravenous Q6H PRN Omair Latif Sheikh, DO      . pantoprazole (PROTONIX) EC tablet 40 mg  40 mg Oral BID AC Annitta Needs, NP      . sodium bicarbonate tablet 650 mg  650 mg Oral BID [place], DO   650 mg at 10/26/16 0818  . tamsulosin (FLOMAX) capsule 0.4 mg  0.4 mg Oral QPM Omair Latif Sheikh, DO   0.4 mg at 10/25/16 1715  . traMADol (ULTRAM) tablet 50 mg  50 mg Oral QID PRN Kerney Elbe, DO         Discharge Medications: Please see discharge summary for a list of discharge medications.  Relevant Imaging Results:  Relevant Lab Results:   Additional Information SSN:  [id number]     [place], [place]

## 2016-10-26 NOTE — Clinical Social Work Placement (Signed)
   CLINICAL SOCIAL WORK PLACEMENT  NOTE  Date:  10/26/2016  Patient Details  Name: John [name] MRN: [id number] Date of Birth: 04-Oct-1930  Clinical Social Work is seeking post-discharge placement for this patient at the [place] level of care (*CSW will initial, date and re-position this form in  chart as items are completed):  Yes   Patient/family provided with [facility] Work Department's list of facilities offering this level of care within the geographic area requested by the patient (or if unable, by the patient's family).  Yes   Patient/family informed of their freedom to choose among providers that offer the needed level of care, that participate in Medicare, Medicaid or managed care program needed by the patient, have an available bed and are willing to accept the patient.  Yes   Patient/family informed of [facility]'s ownership interest in [facility] and [facility], as well as of the fact that they are under no obligation to receive care at these facilities.  PASRR submitted to EDS on       PASRR number received on       Existing PASRR number confirmed on 10/26/16     FL2 transmitted to all facilities in geographic area requested by pt/family on 10/26/16     FL2 transmitted to all facilities within larger geographic area on       Patient informed that his/her managed care company has contracts with or will negotiate with certain facilities, including the following:            Patient/family informed of bed offers received.  Patient chooses bed at       Physician recommends and patient chooses bed at      Patient to be transferred to   on  .  Patient to be transferred to facility by       Patient family notified on   of transfer.  Name of family member notified:        PHYSICIAN Please sign FL2     Additional Comment:    _______________________________________________ [place], LCSW 10/26/2016, 2:14 PM

## 2016-10-26 NOTE — Progress Notes (Signed)
After Lasix and breathing treatments given, patient's respirations are less labored. SpO2 remains around 96% on RA. HOB elevated. Will continues to monitor.

## 2016-10-26 NOTE — Progress Notes (Signed)
Subjective: Interval History: has no complaint of nausea or vomiting. Patient also denies any GI bleeding. He has some weakness otherwise no other complaints..  Objective: Vital signs in last 24 hours: Temp:  [97.6 F (36.4 C)-98.7 F (37.1 C)] 97.9 F (36.6 C) (04/30 0604) Pulse Rate:  [78-90] 86 (04/30 0604) Resp:  [13-22] 20 (04/30 0604) BP: (134-188)/(44-138) 173/73 (04/30 0604) SpO2:  [92 %-100 %] 94 % (04/30 0604) Weight:  [113 kg (249 lb 1.9 oz)] 113 kg (249 lb 1.9 oz) (04/30 0604) Weight change: -0.399 kg (-14.1 oz)  Intake/Output from previous day: 04/29 0701 - 04/30 0700 In: 1720 [P.O.:600; I.V.:450; Blood:670] Out: 1550 [FSELT:5320] Intake/Output this shift: No intake/output data recorded.  General appearance: alert, cooperative and no distress Resp: diminished breath sounds bilaterally Cardio: regular rate and rhythm ExtremitieNo edemaNo edema  Lab Results:  Recent Labs  10/25/16 0705  10/25/16 2336 10/26/16 0543  WBC 12.7*  --   --  11.5*  HGB 8.1*  < > 9.5* 9.5*  HCT 25.5*  < > 29.1* 29.0*  PLT 277  --   --  280  < > = values in this interval not displayed. BMET:  Recent Labs  10/25/16 0705 10/26/16 0543  NA 139 140  K 4.0 3.7  CL 107 106  CO2 23 25  GLUCOSE 195* 166*  BUN 68* 60*  CREATININE 5.52* 4.77*  CALCIUM 8.2* 8.4*   No results for input(s): PTH in the last 72 hours. Iron Studies: No results for input(s): IRON, TIBC, TRANSFERRIN, FERRITIN in the last 72 hours.  Studies/Results: Dg Chest Portable 1 View  Result Date: 10/24/2016 CLINICAL DATA:  Gastrointestinal bleeding. Recent fall. Shortness of breath. EXAM: PORTABLE CHEST 1 VIEW COMPARISON:  Nov 19, 2015 FINDINGS: There is no edema or consolidation. Heart is upper normal in size with pulmonary vascularity within normal limits. There is aortic atherosclerosis. No pneumothorax. No bone lesions. No adenopathy. IMPRESSION: Aortic atherosclerosis.  No edema or consolidation. Electronically  Signed   By: Lowella Grip III M.D.   On: 10/24/2016 13:28    I have reviewed the patient's current medications.  Assessment/Plan: Problem #1 chronic renal failure: Thought to be secondary to hypertension/diabetes. Presently his creatinine has been improving. It has returned to his baseline. Patient states 5 chronic renal failure. Patient at this moment denies any nausea or vomiting. He has 1500 mL of urine output. Problem #2 history of GI bleeding: Thought to be secondary to AV M. Patient has received blood transfusion. His hemoglobin has improved Problem #3 hypertension: His blood pressure is reasonably controlled Problem #4 metabolic disease: His calcium and phosphorus is range Problem #5 history of sleep apnea Problem #6 history of diabetes Plan: 1] We'll continue his present management 2] patient told me he does want any dialysis. He has made his pain from time ago. 3] we'll check his renal panel and CBC in the morning   LOS: 2 days   Caelyn Route S 10/26/2016,8:47 AM

## 2016-10-26 NOTE — Progress Notes (Signed)
    Subjective: Believes it is the month of June but knows the year. No overt GI bleeding. Tolerating clear liquids.   Objective: Vital signs in last 24 hours: Temp:  [97.6 F (36.4 C)-98.7 F (37.1 C)] 97.9 F (36.6 C) (04/30 0604) Pulse Rate:  [78-90] 86 (04/30 0604) Resp:  [13-22] 20 (04/30 0604) BP: (134-188)/(44-138) 173/73 (04/30 0604) SpO2:  [92 %-100 %] 94 % (04/30 0604) Weight:  [249 lb 1.9 oz (113 kg)] 249 lb 1.9 oz (113 kg) (04/30 0604) Last BM Date: 10/25/16 General:   Alert and oriented to person and situation. Confused regarding the place and month. Reorients with assistance but then quickly becomes confused again. No distress.  Head:  Normocephalic and atraumatic. Lungs: expiratory wheezes bilaterally Abdomen:  Bowel sounds present, obese but soft, non-tender/non-distended. Neurologic:  Alert and  oriented to person and situation. Mildly confused.  Psych:  Alert and cooperative. Normal mood and affect.  Intake/Output from previous day: 04/29 0701 - 04/30 0700 In: 1720 [P.O.:600; I.V.:450; Blood:670] Out: 1550 [VVOHY:0737] Intake/Output this shift: No intake/output data recorded.  Lab Results:  Recent Labs  10/24/16 1308  10/25/16 0705 10/25/16 1914 10/25/16 2336 10/26/16 0543  WBC 12.9*  --  12.7*  --   --  11.5*  HGB 7.5*  < > 8.1* 9.7* 9.5* 9.5*  HCT 23.3*  < > 25.5* 29.8* 29.1* 29.0*  PLT 305  --  277  --   --  280  < > = values in this interval not displayed. BMET  Recent Labs  10/24/16 1308 10/25/16 0705 10/26/16 0543  NA 142 139 140  K 4.3 4.0 3.7  CL 107 107 106  CO2 25 23 25   GLUCOSE 234* 195* 166*  BUN 78* 68* 60*  CREATININE 6.30* 5.52* 4.77*  CALCIUM 8.6* 8.2* 8.4*   LFT  Recent Labs  10/24/16 1308 10/25/16 0705 10/26/16 0543  PROT 5.8* 5.5* 5.6*  ALBUMIN 3.0* 2.9* 3.0*  AST 16 18 25   ALT 14* 13* 16*  ALKPHOS 36* 35* 37*  BILITOT 0.4 0.5 0.5   PT/INR  Recent Labs  10/24/16 1308  LABPROT 13.7  INR 1.05      Studies/Results: Dg Chest Portable 1 View  Result Date: 10/24/2016 CLINICAL DATA:  Gastrointestinal bleeding. Recent fall. Shortness of breath. EXAM: PORTABLE CHEST 1 VIEW COMPARISON:  Nov 19, 2015 FINDINGS: There is no edema or consolidation. Heart is upper normal in size with pulmonary vascularity within normal limits. There is aortic atherosclerosis. No pneumothorax. No bone lesions. No adenopathy. IMPRESSION: Aortic atherosclerosis.  No edema or consolidation. Electronically Signed   By: Lowella Grip III M.D.   On: 10/24/2016 13:28    Assessment: 83 year old male admitted with GI bleed, multiple co-morbidities, undergoing colonoscopy and EGD this admission that were overall unrevealing and felt that occult GI bleeding likely secondary to AVMs. Hgb remains stable. No further overt GI bleeding. Some confusion noted this morning, and it appears he had a restless night. From a GI standpoint, he is stable. Will advance diet.    Plan: Change Protonix to oral route Follow H/H Advance to full liquids and as tolerated to renal diet.  Supportive measures   Annitta Needs, ANP-BC [facility]     LOS: 2 days    10/26/2016, 8:29 AM

## 2016-10-26 NOTE — Progress Notes (Signed)
PROGRESS NOTE    John [name]  AYT:[id number] DOB: 1930/07/17 DOA: 10/24/2016 PCP: Purvis Kilts, MD  Brief Narrative: John [name] is a 83 y.o. male with medical history significant of HTN, HLD, DM, Chronic Diastolic CHF, CKD Stage 5 (Refusing Dialysis), Hx of GI Bleed, GERD and other comorbids who presented to [facility] with a CC of Rectal Bleeding weakness and fall. Patient states he was in his usual state of health and went to see his Nephrologist on Wednesday who recommended dialysis but patient refused. He states after his appointment he went to Eat in [place] and came back and had a feeling of pressure that he had to urinate and when he went to the bathroom had a dark colored stool. Patient states he has been having dark and bloody stools ever since Wednesday night and on 10/24/16 AM he woke up to go have a bowel movement and it was pure blood. Patient got up from the toilet and started ambulating back to the bedroom but became extremely weak and slid down (not hitting his head) and hit his life alert button. After several minutes the life alert wasn't working so patient got up from the floor and reached the phone and dialed 911. EMS came to get the patient and brought the patient in to [facility] for evaluation. Patient admits to being incontinent more than usual and states he gets nauseous more and takes Zofran for his nausea. He was admitted for GIB and Gastroenterology was consulted and patient underwent Colonoscopy and EGD which was unrevealing. Bleed was suspected to be from AVMs Nephrology was also consulted for his AKI on CKD Stage 5. Patient became disoriented yesterday and today he knows where he is and only complains of generalized weakness.   Assessment & Plan:   Active Problems:   Type 2 diabetes mellitus with hyperlipidemia (HCC)   Anemia   Essential hypertension   GERD   Melena   CKD (chronic kidney disease), stage IV (HCC)   BPH (benign prostatic hyperplasia)   DM  type 2 (diabetes mellitus, type 2) (HCC)   GI bleed   Chronic diastolic congestive heart failure (HCC)   Bright red rectal bleeding   Anemia due to chronic kidney disease   Acute kidney injury superimposed on chronic kidney disease (HCC)   Leukocytosis  Acute Rectal Bleeding/Lower GI Bleed likely 2/2 to AVMs, improved -Admitted to Telemetry -Type and Screen and being Transfused 2 units of pRBC; Will transfuse another 2 units of pRBC's as patient has not had appropriate response to blood -Gastroenterology Dr. Cammie Sickle by EDP and appreciate Recc's -Underwent Diagnostic EGD which showed normal esophagus, normal stomach, small benign appearing nodule, normal duodenal bulb, first portion of the duodenum and third portion of the duodenum -Colonoscopy done showed that the entire examined colon is normal but there was scattered appearing blood clots throughout the [place] with no fresh blood. Patient's terminal ileum was normal appearing and patient had internal hemorrhoids and the examination was otherwise normal on direct and retroflexion views -Gastroenterology suspects bleeding from Occult AVM's which has now resolved. -Hb/Hct went from 7.5/23.3 -> 8.1/25.5  ->  9.5/29.0 after transfusion of 4 units of pRBC's; Hb/Hct now stable -2 Large Bore IV's -IV Protonix 40 mg po BID changed to Protonix 40 mg po BID -Continue to Hold ASA -Patient is not on any blood thinners -Has had Hx of GIB that was worked up in 2012 and has Hx of AVM -Monitor H/H q6h  -Clear  Liquid Diet advanced to Full Liquid currently -Repeat CBC in AM  Generalized Weakness and Deconditioning -PT Evaluation done and recommending SNF -Consult Social Work for [place] Placement  Acute Delirium -Improved. -Continue to Reorient -Delirium Precautions  Acute on Chronic Anemia in the Setting of GIB s/p transfusion of 4 units of pRBC's  -Has Anemia of Chronic Disease from CKD -Hb/Hct on Admission was 7.5/23.3 and barely  improved to 8.1/25.5 after 2 units of pRBC's so was transfused another 2 units -Hb/Hct now stable at 9.5/29.0 -Continue to Monitor H/H q6h -Repeat CBC in AM  Acute on Chronic Kidney Disease Stage 5 -BUN/Cr was 78/6.30 and improved to 60/4.47  -Gentle IVF at 75 mL/hr now D/C'd as patient received another 2 units of blood. Gave IV Lasix 20 mg before and between units; Had to receive another IV 40 mg Last night -Held Nephrotixic Medications including Losartan 25 mg po Daily and Furosemide 80 mg po BID; May restart po Lasix in AM -C/w Sodium Bicarbonate 650 mg po BID -Appreciate Nephrology Evaluation and Recommendations -Per Patient he is opposed to going on Dialysis and has made it very evident   Leukocytosis -Likely reactive to GIB -WBC was 12.9 on admission and went to 11.5 -No Current S/Sx of Infection but will pan Cx if worsening  -Repeat CBC in AM  Mild Hyperphosphatemia -Likely from CKD -Phos Level was 4.8 yesterday and now 5.1 -Nephrology following and continue to Monitor  BPH -C/w Finasteride 5 mg po q Daily and with Tamsulosin 0.4 mg po qHS -Will place Condom Cath for incontinence   Hypertension -Blood Pressure was 173/73 this AM -Held Antihypertensives in the Setting of GIB including Amlodipine 7.5 mg po every morning, Losartan 25 mg po Daily, and Terazosn 10 mg po qHS, and Nebivolol 10 mg po Daily -Continue to Monitor BP -Given IV Lasix yesterday AM; May restart Antihypertensives slowly and started back on po Furosemide 80 mg po BID -C/w Tamsulosin 0.4 and Finasteride 5 mg po Daily   Hyperlipidemia -Hold Choline Fenofibrate 135 mg po qhS  Hyperglycemia in the setting of Diabetes Mellitus Type 2 -Takes Humalog 75/25 Mix with 60 units in AM and 65 Units qHS -Place on Moderate Novolog SSI -Continue to Monitor CBG's; Ranging from 147-185 -Obtained HbA1c and was 8.2 -Last HbA1c was 8.5  Acute Decompensation of Chronic Diastolic CHF -Went into volume Overload  last night likely from Blood Transfusions -Checked BNP and was 106.0 (Last value in 12/26/14 was 38.0) -Restart po Lasix 80 mg po BID; Patient has gotten 4 units total of blood will give IV 20 mg prior to transfusion and in between transfusion as well as an additional IV 40 mg of Lasix yesterday -ECHO done last in 2016 showed EF of 60-65% with Grade 1 Diastolic Dysfunction -Continue to Monitor Strict I's and O's -Patient is Positive +8 Liters since Admission -Continue to Assess Volume Status in AM   DVT prophylaxis: SCDs Code Status: FULL CODE Family Communication: No Family present at bedside Disposition Plan: SNF  Consultants:   Gastroenterology   Nephrology    Procedures:  EGD Findings:      The examined esophagus was normal.      The entire examined stomach was normal aside from a benign-appearg       antral nodule..      The duodenal bulb, first portion of the duodenum and third portion of       the duodenum were normal. No blood in the upper GI tract. Impression:               -  Normal esophagus.                           - Normal stomach. Small benign-appearin nodule.                           - Normal duodenal bulb, first portion of the                            duodenum and third portion of the duodenum.                           - No specimens collected. I suspect recent bleeding                            from more distal GI tract AVMs. See colonoscop                            report.  COLONOSCOPY  Findings:      The perianal and digital rectal examinations were normal.      The colonic mucosa appeared normal. There was scattered blood clots       throughout the colon. No fresh blood. Normal-appearing mucosa. The       distal terminal ileum was intubated a good 30 cm. There was no blo. The       ileal mupeared normal.      Internal hemorrhoids were found during retroflexion. The hemorrhoids       were Grade I (internal hemorrhoids that do not prolapse).       The exam was otherwise without abnormality on direct and retroflexion       views. Impression:               - The entire examined colon is normal. Small                            amountut the colon No bleeding lesions seen.                            Normal-appearing terminal ileum.                           - Internal hemorrhoids.                           - The examination was otherwise normal on direct                            and retroflexion views.                           - No specimens collected. I suspect occult AVMs in                            the setting of renal failure as the cause of  bleeding. See EGD report.  Antimicrobials:  Anti-infectives    None     Subjective: Seen and examined and stated he was weak. States he got disoriented last night and became confused. No Abdominal Pain and has not had any more bowel movements or bloody stools. No CP or SOB.   Objective: Vitals:   10/25/16 1847 10/25/16 2025 10/25/16 2045 10/26/16 0604  BP: (!) 168/65 (!) 155/62  (!) 173/73  Pulse: 88 87 82 86  Resp: 18 20 20 20   Temp: 98.2 F (36.8 C) 98.7 F (37.1 C)  97.9 F (36.6 C)  TempSrc: Oral Oral  Oral  SpO2: 95% 95% 96% 94%  Weight:    113 kg (249 lb 1.9 oz)  Height:        Intake/Output Summary (Last 24 hours) at 10/26/16 1105 Last data filed at 10/26/16 0900  Gross per 24 hour  Intake             3785 ml  Output             1550 ml  Net             2235 ml   Filed Weights   10/24/16 1824 10/25/16 0632 10/26/16 0604  Weight: 113.4 kg (250 lb) 115.7 kg (255 lb 1.2 oz) 113 kg (249 lb 1.9 oz)   Examination: Physical Exam:  Constitutional: WN/WD, NAD and appears calm and comfortable sitting up in bed with no complaints except being weak. Eyes: Lids and conjunctivae normal, sclerae anicteric  ENMT: Has multiple skin lesions on face and one on left nare and left side of nose appearing to be skin cancer. Grossly normal hearing. Neck:  Appears normal, supple, no cervical masses, normal ROM, no appreciable thyromegaly, no JVD Respiratory: Diminished to auscultation bilaterally, no wheezing, rales, rhonchi or crackles. Normal respiratory effort and patient is not tachypenic. No accessory muscle use.  Cardiovascular: RRR, no murmurs / rubs / gallops. S1 and S2 auscultated. 1+ Lower Extremity Edema Abdomen: Soft, non-tender, non-distended. No masses palpated. No appreciable hepatosplenomegaly. Bowel sounds positive x4.  GU: Deferred. Musculoskeletal: No clubbing / cyanosis of digits/nails. No joint deformity upper and lower extremities. Skin: Multiple upper extremity (Chest and Back) and abdomen skin lesions and seborrheic keratosis noted. No rashes noted. Skin warm and dry.  Neurologic: CN 2-12 grossly intact with no focal deficits. Romberg sign cerebellar reflexes not assessed.  Psychiatric: Normal judgment and insight. Alert and oriented x 2. Normal and pleasant mood and appropriate affect.   Data Reviewed: I have personally reviewed following labs and imaging studies  CBC:  Recent Labs Lab 10/24/16 1308 10/25/16 0030 10/25/16 0705 10/25/16 1914 10/25/16 2336 10/26/16 0543  WBC 12.9*  --  12.7*  --   --  11.5*  NEUTROABS 10.4*  --  9.8*  --   --  8.8*  HGB 7.5* 8.2* 8.1* 9.7* 9.5* 9.5*  HCT 23.3* 25.6* 25.5* 29.8* 29.1* 29.0*  MCV 91.4  --  87.0  --   --  85.8  PLT 305  --  277  --   --  212   Basic Metabolic Panel:  Recent Labs Lab 10/24/16 1308 10/25/16 0705 10/26/16 0543  NA 142 139 140  K 4.3 4.0 3.7  CL 107 107 106  CO2 25 23 25   GLUCOSE 234* 195* 166*  BUN 78* 68* 60*  CREATININE 6.30* 5.52* 4.77*  CALCIUM 8.6* 8.2* 8.4*  MG  --  1.8 1.8  PHOS  --  4.8* 5.1*   GFR: Estimated Creatinine Clearance: 14.1 mL/min (A) (by C-G formula based on SCr of 4.77 mg/dL (H)). Liver Function Tests:  Recent Labs Lab 10/24/16 1308 10/25/16 0705 10/26/16 0543  AST 16 18 25   ALT 14* 13* 16*  ALKPHOS 36*  35* 37*  BILITOT 0.4 0.5 0.5  PROT 5.8* 5.5* 5.6*  ALBUMIN 3.0* 2.9* 3.0*    Recent Labs Lab 10/24/16 1308  LIPASE 30   No results for input(s): AMMONIA in the last 168 hours. Coagulation Profile:  Recent Labs Lab 10/24/16 1308  INR 1.05   Cardiac Enzymes:  Recent Labs Lab 10/24/16 1308  TROPONINI <0.03   BNP (last 3 results) No results for input(s): PROBNP in the last 8760 hours. HbA1C:  Recent Labs  10/24/16 1308  HGBA1C 8.2*   CBG:  Recent Labs Lab 10/25/16 0734 10/25/16 1209 10/25/16 1615 10/25/16 2029 10/26/16 0805  GLUCAP 178* 153* 147* 185* 164*   Lipid Profile: No results for input(s): CHOL, HDL, LDLCALC, TRIG, CHOLHDL, LDLDIRECT in the last 72 hours. Thyroid Function Tests: No results for input(s): TSH, T4TOTAL, FREET4, T3FREE, THYROIDAB in the last 72 hours. Anemia Panel: No results for input(s): VITAMINB12, FOLATE, FERRITIN, TIBC, IRON, RETICCTPCT in the last 72 hours. Sepsis Labs: No results for input(s): PROCALCITON, LATICACIDVEN in the last 168 hours.  No results found for this or any previous visit (from the past 240 hour(s)).   Radiology Studies: Dg Chest Portable 1 View  Result Date: 10/24/2016 CLINICAL DATA:  Gastrointestinal bleeding. Recent fall. Shortness of breath. EXAM: PORTABLE CHEST 1 VIEW COMPARISON:  Nov 19, 2015 FINDINGS: There is no edema or consolidation. Heart is upper normal in size with pulmonary vascularity within normal limits. There is aortic atherosclerosis. No pneumothorax. No bone lesions. No adenopathy. IMPRESSION: Aortic atherosclerosis.  No edema or consolidation. Electronically Signed   By: Lowella Grip III M.D.   On: 10/24/2016 13:28   Scheduled Meds: . DULoxetine  60 mg Oral Daily  . finasteride  5 mg Oral q morning - 10a  . furosemide  80 mg Oral BID  . insulin aspart  0-15 Units Subcutaneous TID WC  . ipratropium-albuterol  3 mL Nebulization Q6H  . LORazepam  1 mg Oral Daily  . pantoprazole  40 mg  Oral BID AC  . sodium bicarbonate  650 mg Oral BID  . tamsulosin  0.4 mg Oral QPM   Continuous Infusions: . sodium chloride    . sodium chloride Stopped (10/25/16 1541)    LOS: 2 days   Kerney Elbe, DO [facility] Pager [phone]  If 7PM-7AM, please contact night-coverage [email or web address] Password [email or web address] 10/26/2016, 11:05 AM

## 2016-10-26 NOTE — Progress Notes (Signed)
Pt continuously trying to get OOB, confused thinking he is at home. Pt is very agitated. Dr. Hilbert Bible paged and asked for safety sitter. Waiting for  Orders.

## 2016-10-26 NOTE — Evaluation (Signed)
Physical Therapy Evaluation Patient Details Name: John [name] MRN: [id number] DOB: 1931-01-13 Today's Date: 10/26/2016   History of Present Illness  83 y.o. male with medical history significant of HTN, HLD, DM, Chronic Diastolic CHF, CKD Stage 5 (Refusing Dialysis), Hx of GI Bleed, GERD and other comorbids who presented to [facility] with a CC of Rectal Bleeding weakness and fall. Patient states he was in his usual state of health and went to see his Nephrologist on Wednesday who recommended dialysis but patient refused. He states after his appointment he went to Eat in [place] and came back and had a feeling of pressure that he had to urinate and when he went to the bathroom had a dark colored stool. Patient states he has been having dark and bloody stools ever since Wednesday night and this AM he woke up to go have a bowel movement and it was pure blood. Patient got up from the toilet and started ambulating back to the bedroom but became extremely weak and slid down (not hitting his head) and hit his life alert button. After several minutes the life alert wasn't working so patient got up from the floor and reached the phone and dialed 911. EMS came to get the patient and brought the patient in to [facility] for evaluation. Patient admits to being incontinent more than usual and states he gets nauseous more and takes Zofran for his nausea. Denied CP or SOB. TRH was called to admit the patient for GI Bleed.   Clinical Impression  Pt received in bed, and was agreeable to PT evaluation.  He lives alone, and is normally able to ambulate in the house with or without the cane.  When outside, he uses the cane.  He is independent with dressing and bathing and his dtr assists with running errands.  Today, he is only oriented x 1, and is hallucinating - seeing people in his room who are not present.  He is very weak, and requires Mod A for supine<>sit and up to Mod A for static sitting balance due to posterior  lean.  He required Min/Mod A +1-2 for transfer bed<>chair due to weakness and confusion today.  He is not safe to return home due to need for increased level of care for all functional mobility.  Recommend SNF at this time.     Follow Up Recommendations SNF    Equipment Recommendations  None recommended by PT    Recommendations for Other Services       Precautions / Restrictions Precautions Precautions: Fall Precaution Comments: 2-3 falls in the past 6 months.  Restrictions Weight Bearing Restrictions: No      Mobility  Bed Mobility Overal bed mobility: Needs Assistance Bed Mobility: Supine to Sit     Supine to sit: Mod assist;HOB elevated     General bed mobility comments: increased time.  Pt demonstrates posterior LOB when sitting on the EOB - likely due to weakness as well as body habitus.  Transfers Overall transfer level: Needs assistance Equipment used: Rolling walker (2 wheeled) Transfers: Sit to/from [facility] Sit to Stand: Min assist;Mod assist         General transfer comment: Pt required up to Mod A due to weakness while standing with noted knee buckle.  During transfer +2 person was required for pt to be re-directed to the chair.    Ambulation/Gait                Stairs  Wheelchair Mobility    Modified Rankin (Stroke Patients Only)       Balance Overall balance assessment: History of Falls;Needs assistance Sitting-balance support: Bilateral upper extremity supported;Feet supported Sitting balance-Leahy Scale: Poor Sitting balance - Comments: Several posterior LOB that requries at least constant supervision and intermittent Mod A to correct balance.     Standing balance support: Bilateral upper extremity supported Standing balance-Leahy Scale: Poor Standing balance comment: Posterior lean - pt seems to be purposefully extending at hips and lower back to push his trunk behind his base of support.  When given  cues to lean forward, he states "I always stand bent forward"                              Pertinent Vitals/Pain Pain Assessment: No/denies pain    Home Living   Living Arrangements: Alone Available Help at Discharge: Family (brother 1-2 times per week, and dtr comes 1x per week. ) Type of Home: House Home Access: Stairs to enter   [employer] of Steps: 5 on the back with HR and 3 on the front with HR.  - pt normally goes in/out the back.   Home Layout: One level Home Equipment: Walker - 2 wheels;Cane - single point;Bedside commode      Prior Function     Gait / Transfers Assistance Needed: Pt states he uses the cane most of the time.  Pt states he doesn't drive anymore because he is scared.  Dtr assists with running errands, going to appointments.  ADL's / Homemaking Assistance Needed: independent with dressing and bathing.    Comments: grandson or son in law helps with yard work.       Hand Dominance   Dominant Hand: Right    Extremity/Trunk Assessment   Upper Extremity Assessment Upper Extremity Assessment: Generalized weakness    Lower Extremity Assessment Lower Extremity Assessment: Generalized weakness       Communication   Communication: No difficulties  Cognition Arousal/Alertness: Lethargic Behavior During Therapy: WFL for tasks assessed/performed Overall Cognitive Status: Impaired/Different from baseline Area of Impairment: Orientation                 Orientation Level: Disoriented to;Place;Situation;Time             General Comments: "tobacco factory," "I'm here because I live in [place], and I got married, and we hit it off, but in about 20 years she got CA and died,"  "10-28-2016."  Pt is also having visual hallucinations of people in his room whom he is talking to.       General Comments      Exercises     Assessment/Plan    PT Assessment Patient needs continued PT services  PT Problem List  Decreased strength;Decreased activity tolerance;Decreased balance;Decreased mobility;Decreased cognition;Decreased knowledge of use of DME;Decreased safety awareness;Decreased knowledge of precautions;Obesity       PT Treatment Interventions DME instruction;Gait training;Functional mobility training;Therapeutic activities;Therapeutic exercise;Balance training;Cognitive remediation;Patient/family education    PT Goals (Current goals can be found in the Care Plan section)  Acute Rehab PT Goals PT Goal Formulation: Patient unable to participate in goal setting Time For Goal Achievement: 11/09/16 Potential to Achieve Goals: Fair    Frequency Min 3X/week   Barriers to discharge Decreased caregiver support Pt lives alone    Co-evaluation               End of Session Equipment Utilized  During Treatment: Gait belt Activity Tolerance: Patient limited by fatigue;Other (comment) (Confusion) Patient left: in chair;with call bell/phone within reach;with chair alarm set Nurse Communication: Mobility status Marye Round, RN aware of pt's location, and mobiltiy sheet left hanging in the pt's room. ) PT Visit Diagnosis: Other abnormalities of gait and mobility (R26.89);Muscle weakness (generalized) (M62.81);History of falling (Z91.81)    Time: 4628-6381 PT Time Calculation (min) (ACUTE ONLY): 35 min   Charges:   PT Evaluation $PT Eval Low Complexity: 1 Procedure PT Treatments $Therapeutic Activity: 8-22 mins   PT G Codes:   PT G-Codes **NOT FOR INPATIENT CLASS** Functional Assessment Tool Used: AM-PAC 6 Clicks Basic Mobility;Clinical judgement Functional Limitation: Mobility: Walking and moving around Mobility: Walking and Moving Around Current Status (R7116): At least 60 percent but less than 80 percent impaired, limited or restricted Mobility: Walking and Moving Around Goal Status [phone]): At least 40 percent but less than 60 percent impaired, limited or restricted    Beth Gaelan Glennon,  PT, DPT X: [phone]

## 2016-10-26 NOTE — Progress Notes (Signed)
Patient found sitting on edge of bed with gown off, tele leads off, condom cath removed, and one SCD hose detached. Patient stated that he was trying to get up to go to "the other room". Patient re-oriented and he agreed. As patient continued to talk, he would know he was in the hospital one second, then think he was at home the next second. Patient was assisted back to bed x2 assist. Bed alarm on. Patient reminded not to get up unassisted. Will continue to monitor.

## 2016-10-26 NOTE — Progress Notes (Addendum)
Patient resting in bed with eyes closed, but talking as if he is talking to someone else. Earlier in shift, patient stated that towards the door in his room looks similar to his house, so he keeps thinking he is at home. At one time, patient was trying to get up to "go out front". Within seconds, patient remembered that he was in the hospital. Patient was also pushing buttons on his tele monitor, thinking it was the remote. Bed in lowest position, side rails up x3, bed alarm on, and call light within reach. Will continue to monitor.

## 2016-10-26 NOTE — Plan of Care (Signed)
Problem: Health Behavior/Discharge Planning: Goal: Ability to manage health-related needs will improve Outcome: Not Progressing Pt very confused this shift. Pt being very aggressive thinking he is home and yelling, "Why are we here." RN received order for safety sitter d/t pt being violent and trying to get OOB on several occasions. Sitter in room with pt. Pt given pain medication d/t generalized pain. Will continue to monitor pt

## 2016-10-26 NOTE — Progress Notes (Signed)
Patient alert an oriented to person, place, month, and year this AM but still intermittently confused. Patient talking about having dreams similar to what was happening with him through the night. Patient stated that he has been seeing people. Stated that he was seeing an older woman with her hair up standing in his room. Patient denies auditory hallucinations at this time. Patient stated that when he is at home, he sees and hears his mother and father sometimes. 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 Will continue to monitor and pass on to next shift.

## 2016-10-27 ENCOUNTER — Encounter (HOSPITAL_COMMUNITY): Payer: Self-pay | Admitting: Internal Medicine

## 2016-10-27 ENCOUNTER — Encounter: Payer: Self-pay | Admitting: Internal Medicine

## 2016-10-27 ENCOUNTER — Telehealth: Payer: Self-pay | Admitting: Gastroenterology

## 2016-10-27 DIAGNOSIS — E876 Hypokalemia: Secondary | ICD-10-CM

## 2016-10-27 LAB — COMPREHENSIVE METABOLIC PANEL
ALBUMIN: 2.9 g/dL — AB (ref 3.5–5.0)
ALK PHOS: 44 U/L (ref 38–126)
ALT: 23 U/L (ref 17–63)
AST: 39 U/L (ref 15–41)
Anion gap: 10 (ref 5–15)
BILIRUBIN TOTAL: 0.8 mg/dL (ref 0.3–1.2)
BUN: 56 mg/dL — AB (ref 6–20)
CALCIUM: 8.7 mg/dL — AB (ref 8.9–10.3)
CO2: 26 mmol/L (ref 22–32)
Chloride: 103 mmol/L (ref 101–111)
Creatinine, Ser: 4.78 mg/dL — ABNORMAL HIGH (ref 0.61–1.24)
GFR calc Af Amer: 12 mL/min — ABNORMAL LOW (ref >60)
GFR calc non Af Amer: 10 mL/min — ABNORMAL LOW (ref >60)
Glucose, Bld: 146 mg/dL — ABNORMAL HIGH (ref 65–99)
Potassium: 3.3 mmol/L — ABNORMAL LOW (ref 3.5–5.1)
SODIUM: 139 mmol/L (ref 135–145)
TOTAL PROTEIN: 5.7 g/dL — AB (ref 6.5–8.1)

## 2016-10-27 LAB — CBC
HCT: 29.5 % — ABNORMAL LOW (ref 39.0–52.0)
Hemoglobin: 9.8 g/dL — ABNORMAL LOW (ref 13.0–17.0)
MCH: 28.4 pg (ref 26.0–34.0)
MCHC: 33.2 g/dL (ref 30.0–36.0)
MCV: 85.5 fL (ref 78.0–100.0)
Platelets: 285 10*3/uL (ref 150–400)
RBC: 3.45 MIL/uL — AB (ref 4.22–5.81)
RDW: 17.8 % — ABNORMAL HIGH (ref 11.5–15.5)
WBC: 10.8 10*3/uL — AB (ref 4.0–10.5)

## 2016-10-27 LAB — GLUCOSE, CAPILLARY
GLUCOSE-CAPILLARY: 136 mg/dL — AB (ref 65–99)
GLUCOSE-CAPILLARY: 192 mg/dL — AB (ref 65–99)
Glucose-Capillary: 150 mg/dL — ABNORMAL HIGH (ref 65–99)
Glucose-Capillary: 190 mg/dL — ABNORMAL HIGH (ref 65–99)

## 2016-10-27 LAB — PHOSPHORUS: Phosphorus: 5.7 mg/dL — ABNORMAL HIGH (ref 2.5–4.6)

## 2016-10-27 LAB — MAGNESIUM: Magnesium: 1.9 mg/dL (ref 1.7–2.4)

## 2016-10-27 MED ORDER — FUROSEMIDE 80 MG PO TABS
80.0000 mg | ORAL_TABLET | Freq: Every day | ORAL | Status: DC
  Administered 2016-10-28: 80 mg via ORAL
  Filled 2016-10-27: qty 1

## 2016-10-27 MED ORDER — IPRATROPIUM-ALBUTEROL 0.5-2.5 (3) MG/3ML IN SOLN
3.0000 mL | Freq: Two times a day (BID) | RESPIRATORY_TRACT | Status: DC
  Administered 2016-10-27 – 2016-10-28 (×2): 3 mL via RESPIRATORY_TRACT
  Filled 2016-10-27 (×2): qty 3

## 2016-10-27 MED ORDER — POTASSIUM CHLORIDE CRYS ER 20 MEQ PO TBCR
40.0000 meq | EXTENDED_RELEASE_TABLET | Freq: Two times a day (BID) | ORAL | Status: DC
  Administered 2016-10-27 – 2016-10-28 (×3): 40 meq via ORAL
  Filled 2016-10-27 (×3): qty 2

## 2016-10-27 MED ORDER — ALBUTEROL SULFATE (2.5 MG/3ML) 0.083% IN NEBU: 2.5000 mg | INHALATION_SOLUTION | RESPIRATORY_TRACT | Status: DC | PRN

## 2016-10-27 NOTE — Telephone Encounter (Signed)
APPT MADE AND LETTER SENT  °

## 2016-10-27 NOTE — Progress Notes (Signed)
PROGRESS NOTE    John [name]  XHB:[id number] DOB: 11/17/1930 DOA: 10/24/2016 PCP: Purvis Kilts, MD  Brief Narrative: John [name] is a 83 y.o. male with medical history significant of HTN, HLD, DM, Chronic Diastolic CHF, CKD Stage 5 (Refusing Dialysis), Hx of GI Bleed, GERD and other comorbids who presented to [facility] with a CC of Rectal Bleeding weakness and fall. Patient states he was in his usual state of health and went to see his Nephrologist on Wednesday who recommended dialysis but patient refused. He states after his appointment he went to Eat in [place] and came back and had a feeling of pressure that he had to urinate and when he went to the bathroom had a dark colored stool. Patient states he has been having dark and bloody stools ever since Wednesday night and on 10/24/16 AM he woke up to go have a bowel movement and it was pure blood. Patient got up from the toilet and started ambulating back to the bedroom but became extremely weak and slid down (not hitting his head) and hit his life alert button. After several minutes the life alert wasn't working so patient got up from the floor and reached the phone and dialed 911. EMS came to get the patient and brought the patient in to [facility] for evaluation. Patient admits to being incontinent more than usual and states he gets nauseous more and takes Zofran for his nausea. He was admitted for GIB and Gastroenterology was consulted and patient underwent Colonoscopy and EGD which was unrevealing. Bleed was suspected to be from AVMs Nephrology was also consulted for his AKI on CKD Stage 5. Patient became disoriented the night of 4/29-/4/30 and again overnight. He knows where he is this AM and only complains of generalized weakness. Agreeable to SNF but needs to have 1:1 Sitter discontinued for 24 hours.   Assessment & Plan:   Active Problems:   Type 2 diabetes mellitus with hyperlipidemia (HCC)   Anemia   Essential hypertension  GERD   Melena   CKD (chronic kidney disease), stage IV (HCC)   BPH (benign prostatic hyperplasia)   DM type 2 (diabetes mellitus, type 2) (HCC)   GI bleed   Chronic diastolic congestive heart failure (HCC)   Bright red rectal bleeding   Anemia due to chronic kidney disease   Acute kidney injury superimposed on chronic kidney disease (HCC)   Leukocytosis  Acute Rectal Bleeding/Lower GI Bleed likely 2/2 to AVMs, improved -Admitted to Telemetry -Type and Screen and being Transfused 2 units of pRBC; Will transfuse another 2 units of pRBC's as patient has not had appropriate response to blood -Gastroenterology Dr. Cammie Sickle by EDP and appreciate Recc's -Underwent Diagnostic EGD which showed normal esophagus, normal stomach, small benign appearing nodule, normal duodenal bulb, first portion of the duodenum and third portion of the duodenum -Colonoscopy done showed that the entire examined colon is normal but there was scattered appearing blood clots throughout the [place] with no fresh blood. Patient's terminal ileum was normal appearing and patient had internal hemorrhoids and the examination was otherwise normal on direct and retroflexion views -Gastroenterology suspects bleeding from Occult AVM's which has now resolved. -Hb/Hct went from 7.5/23.3 -> 8.1/25.5  ->  9.5/29.0 -> 9.8/29.5 after transfusion of 4 units of pRBC's; Hb/Hct now stable -2 Large Bore IV's -C/w Protonix 40 mg po BID -Continue to Hold ASA -Patient is not on any blood thinners -Has had Hx of GIB that was worked up  in 2012 and has Hx of AVM -Full Liquid advanced to Renal Diet/Carb Modified Diet by Gastroenterology -Repeat CBC in AM and Follow H/H -Outpatient GI follow up in 6 weeks with [facility]  Generalized Weakness and Deconditioning -PT Evaluation done and recommending SNF -Consult Social Work for Skilled Nursing Placement -Needs to be off of 1:1 Sitter for at least 24 hours prior to  D/C'ing to SNF  Acute Delirium -Has periods of Lucidity  -Continue to Reorient -Delirium Precautions -Had 1:1 Sitter but will Discontinue in anticipation of D/Cing to SNF  Acute on Chronic Anemia in the Setting of GIB s/p transfusion of 4 units of pRBC's  -Has Anemia of Chronic Disease from CKD -Hb/Hct on Admission was 7.5/23.3 and barely improved to 8.1/25.5 after 2 units of pRBC's so was transfused another 2 units -Hb/Hct now stable at 9.8/29.5 -Continue to Monitor H/H q6h -Repeat CBC in AM  Acute on Chronic Kidney Disease Stage 5 -BUN/Cr was 78/6.30 and improved to 60/4.47 and now is 56/4.78 -Gentle IVF at 75 mL/hr now D/C'd as patient received another 2 units of blood. Gave IV Lasix 20 mg before and between units; Had to receive another IV 40 mg Last 4/29 -Held Nephrotixic Medications including Losartan 25 mg po Daily and  -Restarted Furosemide 80 mg po BID but nephrology reduced it down to 80 mg po Daily;  -C/w Sodium Bicarbonate 650 mg po BID -Appreciate Nephrology Evaluation and Recommendations -Per Patient he is opposed to going on Dialysis and has made it very evident   Leukocytosis -Likely reactive to GIB -WBC was 12.9 on admission and went to 10.8 -No Current S/Sx of Infection but will pan Cx if worsening  -Repeat CBC in AM  Mild Hyperphosphatemia -Likely from CKD -Phos Level was 4.8 yesterday and now 5.7 -Nephrology following and continue to Monitor  Hypokalemia -Mild at 3.3 -Replete with 40 meQ of KCl BID -Repeat CMP in AM  BPH -C/w Finasteride 5 mg po q Daily and with Tamsulosin 0.4 mg po qHS -Will place Condom Cath for incontinence   Hypertension -Blood Pressure was 163/68 this AM -Held Antihypertensives in the Setting of GIB including Amlodipine 7.5 mg po every morning, Losartan 25 mg po Daily, and Terazosn 10 mg po qHS, and Nebivolol 10 mg po Daily -Continue to Monitor BP -Given IV Lasix 4/29; May restart Antihypertensives slowly and started back  on po Furosemide 80 mg po BID but Nephrology reduced it down to 80 mg po daily  -C/w Tamsulosin 0.4 and Finasteride 5 mg po Daily   Hyperlipidemia -Held Choline Fenofibrate 135 mg po qhS  Hyperglycemia in the setting of Diabetes Mellitus Type 2 -Takes Humalog 75/25 Mix with 60 units in AM and 65 Units qHS -Place on Moderate Novolog SSI -Continue to Monitor CBG's; Ranging from 136-192 -Obtained HbA1c and was 8.2 -Last HbA1c was 8.5  Acute Decompensation of Chronic Diastolic CHF -Went into volume Overload last 4/29 likely from Blood Transfusions -Checked BNP and was 106.0 (Last value in 12/26/14 was 38.0) -Restarted po Lasix 80 mg po BID and Nehprology decreased it down to 80 mg po Daily; Patient has gotten 4 units total of blood will give IV 20 mg prior to transfusion and in between transfusion as well as an additional IV 40 mg of Lasix yesterday -ECHO done last in 2016 showed EF of 60-65% with Grade 1 Diastolic Dysfunction -Continue to Monitor Strict I's and O's -Patient is Positive +4.8 Liters since Admission now -Continue to Assess Volume Status  in AM   DVT prophylaxis: SCDs Code Status: FULL CODE Family Communication: No Family present at bedside Disposition Plan: SNF possibly tomorrow  Consultants:   Gastroenterology   Nephrology    Procedures:  EGD Findings:      The examined esophagus was normal.      The entire examined stomach was normal aside from a benign-appearg       antral nodule..      The duodenal bulb, first portion of the duodenum and third portion of       the duodenum were normal. No blood in the upper GI tract. Impression:               - Normal esophagus.                           - Normal stomach. Small benign-appearin nodule.                           - Normal duodenal bulb, first portion of the                            duodenum and third portion of the duodenum.                           - No specimens collected. I suspect recent bleeding                             from more distal GI tract AVMs. See colonoscop                            report.  COLONOSCOPY  Findings:      The perianal and digital rectal examinations were normal.      The colonic mucosa appeared normal. There was scattered blood clots       throughout the colon. No fresh blood. Normal-appearing mucosa. The       distal terminal ileum was intubated a good 30 cm. There was no blo. The       ileal mupeared normal.      Internal hemorrhoids were found during retroflexion. The hemorrhoids       were Grade I (internal hemorrhoids that do not prolapse).      The exam was otherwise without abnormality on direct and retroflexion       views. Impression:               - The entire examined colon is normal. Small                            amountut the colon No bleeding lesions seen.                            Normal-appearing terminal ileum.                           - Internal hemorrhoids.                           - The examination was otherwise normal on direct  and retroflexion views.                           - No specimens collected. I suspect occult AVMs in                            the setting of renal failure as the cause of                            bleeding. See EGD report.  Antimicrobials:  Anti-infectives    None     Subjective: Seen and examined and stated he was more with it this morning. Still saying he was weak and got confused last night. No CP or SOB.    Objective: Vitals:   10/26/16 2017 10/26/16 2115 10/27/16 0546 10/27/16 0810  BP:  (!) 196/68 (!) 163/68   Pulse:  91 82   Resp:  20 (!) 22   Temp:  97.6 F (36.4 C)    TempSrc:  Oral    SpO2: (!) 86% 97% 96% 97%  Weight:   111.9 kg (246 lb 9.6 oz)   Height:        Intake/Output Summary (Last 24 hours) at 10/27/16 1829 Last data filed at 10/27/16 0830  Gross per 24 hour  Intake              440 ml  Output             2325 ml  Net            -1885 ml    Filed Weights   10/25/16 9379 10/26/16 0604 10/27/16 0546  Weight: 115.7 kg (255 lb 1.2 oz) 113 kg (249 lb 1.9 oz) 111.9 kg (246 lb 9.6 oz)   Examination: Physical Exam:  Constitutional: WN/WD, NAD and appears calm and comfortable sitting up in bed with 1:1 sitter bedside with no complaints except being weak. Eyes: Lids and conjunctivae normal, sclerae anicteric  ENMT: Has multiple skin lesions on face and one on left nare and left side of nose appearing to be skin cancer. Grossly normal hearing. Neck: Appears normal, supple, no cervical masses, normal ROM, no appreciable thyromegaly, no JVD Respiratory: Diminished to auscultation bilaterally, no wheezing, rales, rhonchi or crackles. Normal respiratory effort and patient is not tachypenic. No accessory muscle use.  Cardiovascular: RRR, no murmurs / rubs / gallops. S1 and S2 auscultated. Mild Lower Extremity Edema Abdomen: Soft, non-tender, non-distended. No masses palpated. No appreciable hepatosplenomegaly. Bowel sounds positive x4.  GU: Deferred. Musculoskeletal: No clubbing / cyanosis of digits/nails. No joint deformity upper and lower extremities. Skin: Multiple upper extremity (Chest and Back) and abdomen skin lesions and seborrheic keratosis noted. No rashes noted. Skin warm and dry.  Neurologic: CN 2-12 grossly intact with no focal deficits. Romberg sign cerebellar reflexes not assessed.  Psychiatric: Normal judgment and insight. Alert and oriented x 2. Normal and pleasant mood and appropriate affect.   Data Reviewed: I have personally reviewed following labs and imaging studies  CBC:  Recent Labs Lab 10/24/16 1308  10/25/16 0705 10/25/16 1914 10/25/16 2336 10/26/16 0543 10/27/16 0548  WBC 12.9*  --  12.7*  --   --  11.5* 10.8*  NEUTROABS 10.4*  --  9.8*  --   --  8.8*  --   HGB 7.5*  < > 8.1* 9.7* 9.5* 9.5* 9.8*  HCT 23.3*  < > 25.5* 29.8* 29.1* 29.0* 29.5*  MCV 91.4  --  87.0  --   --  85.8 85.5  PLT 305  --  277  --    --  280 285  < > = values in this interval not displayed. Basic Metabolic Panel:  Recent Labs Lab 10/24/16 1308 10/25/16 0705 10/26/16 0543 10/26/16 1222 10/27/16 0548  NA 142 139 140 140 139  K 4.3 4.0 3.7 3.7 3.3*  CL 107 107 106 106 103  CO2 25 23 25 25 26   GLUCOSE 234* 195* 166* 136* 146*  BUN 78* 68* 60* 59* 56*  CREATININE 6.30* 5.52* 4.77* 4.83* 4.78*  CALCIUM 8.6* 8.2* 8.4* 8.7* 8.7*  MG  --  1.8 1.8  --  1.9  PHOS  --  4.8* 5.1*  --  5.7*   GFR: Estimated Creatinine Clearance: 14 mL/min (A) (by C-G formula based on SCr of 4.78 mg/dL (H)). Liver Function Tests:  Recent Labs Lab 10/24/16 1308 10/25/16 0705 10/26/16 0543 10/26/16 1222 10/27/16 0548  AST 16 18 25  33 39  ALT 14* 13* 16* 19 23  ALKPHOS 36* 35* 37* 43 44  BILITOT 0.4 0.5 0.5 0.7 0.8  PROT 5.8* 5.5* 5.6* 6.0* 5.7*  ALBUMIN 3.0* 2.9* 3.0* 3.2* 2.9*    Recent Labs Lab 10/24/16 1308  LIPASE 30   No results for input(s): AMMONIA in the last 168 hours. Coagulation Profile:  Recent Labs Lab 10/24/16 1308  INR 1.05   Cardiac Enzymes:  Recent Labs Lab 10/24/16 1308  TROPONINI <0.03   BNP (last 3 results) No results for input(s): PROBNP in the last 8760 hours. HbA1C: No results for input(s): HGBA1C in the last 72 hours. CBG:  Recent Labs Lab 10/26/16 1621 10/26/16 2116 10/27/16 0801 10/27/16 1113 10/27/16 1620  GLUCAP 141* 175* 150* 190* 136*   Lipid Profile: No results for input(s): CHOL, HDL, LDLCALC, TRIG, CHOLHDL, LDLDIRECT in the last 72 hours. Thyroid Function Tests: No results for input(s): TSH, T4TOTAL, FREET4, T3FREE, THYROIDAB in the last 72 hours. Anemia Panel: No results for input(s): VITAMINB12, FOLATE, FERRITIN, TIBC, IRON, RETICCTPCT in the last 72 hours. Sepsis Labs: No results for input(s): PROCALCITON, LATICACIDVEN in the last 168 hours.  No results found for this or any previous visit (from the past 240 hour(s)).   Radiology Studies: No results  found. Scheduled Meds: . DULoxetine  60 mg Oral Daily  . finasteride  5 mg Oral q morning - 10a  . [START ON 10/28/2016] furosemide  80 mg Oral Daily  . insulin aspart  0-15 Units Subcutaneous TID WC  . ipratropium-albuterol  3 mL Nebulization BID  . LORazepam  1 mg Oral Daily  . pantoprazole  40 mg Oral BID AC  . potassium chloride  40 mEq Oral BID  . sodium bicarbonate  650 mg Oral BID  . tamsulosin  0.4 mg Oral QPM   Continuous Infusions: . sodium chloride    . sodium chloride Stopped (10/25/16 1541)    LOS: 3 days   Kerney Elbe, DO [facility] Pager [phone]  If 7PM-7AM, please contact night-coverage [email or web address] Password [facility] 10/27/2016, 6:29 PM

## 2016-10-27 NOTE — Care Management Note (Signed)
Case Management Note  Patient Details  Name: John [name] MRN: [id number] Date of Birth: 08/21/1930  Subjective/Objective:   Adm with GI bleed, from home alone. Recommended for SNF. Family agreeable.                 Action/Plan: CSW aware and making arrangements. CM will continue to follow for needs.  Expected Discharge Date:       10/28/2016           Expected Discharge Plan:  [place]  In-House Referral:  Clinical Social Work  Discharge planning Services  CM Consult  Post Acute Care Choice:    Choice offered to:  NA  DME Arranged:    DME Agency:     HH Arranged:    [place] Agency:     Status of Service:  In process, will continue to follow  If discussed at Long Length of Stay Meetings, dates discussed:    Additional Comments:  Monta Maiorana, Chauncey Reading, RN 10/27/2016, 11:42 AM

## 2016-10-27 NOTE — Consult Note (Signed)
   [facility] CM Inpatient Consult   10/27/2016  Ayinde Swim Kamps 01/03/1931 [id number]   Patient screened for potential [place] Management services. Noted patient is listed as active with [facility], but patient was previously discharged. Confirmed this information with previous  Market researcher. Patient is eligible for [place], but after speaking with inpatient case manager she stated patient's discharge plan is SNF and  there were no identifiable [facility] care management needs. [facility] Care Management services not appropriate at this time. If patient's post hospital needs change please place a [facility] Care Management consult. For questions please contact:   Maziyah Vessel RN, [facility] Liaison  [phone]) Business Mobile [phone]) Toll free office

## 2016-10-27 NOTE — Progress Notes (Signed)
Subjective: Interval History: Patient denies any nausea or vomiting. He has some cough but no sputum production. He is feeling better.  Objective: Vital signs in last 24 hours: Temp:  [97.6 F (36.4 C)] 97.6 F (36.4 C) (04/30 2115) Pulse Rate:  [82-91] 82 (05/01 0546) Resp:  [19-22] 22 (05/01 0546) BP: (163-196)/(68-80) 163/68 (05/01 0546) SpO2:  [86 %-97 %] 96 % (05/01 0546) Weight:  [111.9 kg (246 lb 9.6 oz)] 111.9 kg (246 lb 9.6 oz) (05/01 0546) Weight change: -1.143 kg (-2 lb 8.3 oz)  Intake/Output from previous day: 04/30 0701 - 05/01 0700 In: 3080 [P.O.:3080] Out: 3675 [Urine:3675] Intake/Output this shift: No intake/output data recorded.  General appearance: alert, cooperative and no distress Resp: diminished breath sounds bilaterally Cardio: regular rate and rhythm ExtremitieNo edemaNo edema  Lab Results:  Recent Labs  10/26/16 0543 10/27/16 0548  WBC 11.5* 10.8*  HGB 9.5* 9.8*  HCT 29.0* 29.5*  PLT 280 285   BMET:   Recent Labs  10/26/16 0543 10/26/16 1222  NA 140 140  K 3.7 3.7  CL 106 106  CO2 25 25  GLUCOSE 166* 136*  BUN 60* 59*  CREATININE 4.77* 4.83*  CALCIUM 8.4* 8.7*   No results for input(s): PTH in the last 72 hours. Iron Studies: No results for input(s): IRON, TIBC, TRANSFERRIN, FERRITIN in the last 72 hours.  Studies/Results: No results found.  I have reviewed the patient's current medications.  Assessment/Plan: Problem #1 chronic renal failure: Thought to be secondary to hypertension/diabetes. His renal function remains stable. He had about 3600 mL of urine output. Problem #2 history of GI bleeding: Thought to be secondary to AV M. Patient has received blood transfusion. His hemoglobin is stable. Problem #3 hypertension: His blood pressure is reasonably controlled Problem #4 metabolic disease: His calcium and phosphorus is range. Patient is not on a binder. Problem #5 history of sleep apnea Problem #6 history of diabetes   Problem #7 hypokalemia: Most likely from Lasix Plan: 1] We'll Decrease Lasix to 80 mg by mouth once a day  2] KCl 40 mEq by mouth 1 dose 3] we'll check his renal panel and CBC in the morning 4] we'll follow him as an outpatient when his discharge.   LOS: 3 days   John [name] S 10/27/2016,8:01 AM

## 2016-10-27 NOTE — Telephone Encounter (Signed)
Please arrange outpatient hospital follow-up in 6 weeks.  

## 2016-10-27 NOTE — Progress Notes (Signed)
    Subjective: Still with some confusion this morning. No abdominal pain, N/V. Per nursing staff, yesterday had a dark bowel movement. No hematochezia.   Objective: Vital signs in last 24 hours: Temp:  [97.6 F (36.4 C)] 97.6 F (36.4 C) (04/30 2115) Pulse Rate:  [82-91] 82 (05/01 0546) Resp:  [19-22] 22 (05/01 0546) BP: (163-196)/(68-80) 163/68 (05/01 0546) SpO2:  [86 %-97 %] 96 % (05/01 0546) Weight:  [246 lb 9.6 oz (111.9 kg)] 246 lb 9.6 oz (111.9 kg) (05/01 0546) Last BM Date: 10/26/16 General:   Alert and oriented to person and place.  Head:  Normocephalic and atraumatic. Abdomen:  Bowel sounds present, soft, non-tender, non-distended.  Neurologic:  Alert and  oriented to person, place.    Intake/Output from previous day: 04/30 0701 - 05/01 0700 In: 3080 [P.O.:3080] Out: 3675 [Urine:3675] Intake/Output this shift: No intake/output data recorded.  Lab Results:  Recent Labs  10/25/16 0705  10/25/16 2336 10/26/16 0543 10/27/16 0548  WBC 12.7*  --   --  11.5* 10.8*  HGB 8.1*  < > 9.5* 9.5* 9.8*  HCT 25.5*  < > 29.1* 29.0* 29.5*  PLT 277  --   --  280 285  < > = values in this interval not displayed. BMET  Recent Labs  10/25/16 0705 10/26/16 0543 10/26/16 1222  NA 139 140 140  K 4.0 3.7 3.7  CL 107 106 106  CO2 23 25 25   GLUCOSE 195* 166* 136*  BUN 68* 60* 59*  CREATININE 5.52* 4.77* 4.83*  CALCIUM 8.2* 8.4* 8.7*   LFT  Recent Labs  10/25/16 0705 10/26/16 0543 10/26/16 1222  PROT 5.5* 5.6* 6.0*  ALBUMIN 2.9* 3.0* 3.2*  AST 18 25 33  ALT 13* 16* 19  ALKPHOS 35* 37* 43  BILITOT 0.5 0.5 0.7   PT/INR  Recent Labs  10/24/16 1308  LABPROT 13.7  INR 1.05   Assessment: 83 year old male admitted with GI bleed, multiple co-morbidities, undergoing colonoscopy and EGD this admission that were overall unrevealing and felt that occult GI bleeding likely secondary to AVMs. Hgb remains stable. Still with some confusion. From a GI standpoint, he is  stable. Advanced to renal diet.   Plan: Continue Protonix Continue renal diet Follow H/H Stable from a GI standpoint. Will arrange follow-up with [place] an outpatient   Annitta Needs, ANP-BC [facility]    LOS: 3 days    10/27/2016, 7:52 AM

## 2016-10-28 DIAGNOSIS — I10 Essential (primary) hypertension: Secondary | ICD-10-CM | POA: Diagnosis not present

## 2016-10-28 DIAGNOSIS — D649 Anemia, unspecified: Secondary | ICD-10-CM

## 2016-10-28 DIAGNOSIS — R05 Cough: Secondary | ICD-10-CM | POA: Diagnosis not present

## 2016-10-28 DIAGNOSIS — K921 Melena: Secondary | ICD-10-CM | POA: Diagnosis not present

## 2016-10-28 DIAGNOSIS — F419 Anxiety disorder, unspecified: Secondary | ICD-10-CM | POA: Diagnosis not present

## 2016-10-28 DIAGNOSIS — N185 Chronic kidney disease, stage 5: Secondary | ICD-10-CM | POA: Diagnosis not present

## 2016-10-28 DIAGNOSIS — M6281 Muscle weakness (generalized): Secondary | ICD-10-CM | POA: Diagnosis not present

## 2016-10-28 DIAGNOSIS — K922 Gastrointestinal hemorrhage, unspecified: Secondary | ICD-10-CM | POA: Diagnosis not present

## 2016-10-28 DIAGNOSIS — D62 Acute posthemorrhagic anemia: Secondary | ICD-10-CM

## 2016-10-28 DIAGNOSIS — R1312 Dysphagia, oropharyngeal phase: Secondary | ICD-10-CM | POA: Diagnosis not present

## 2016-10-28 DIAGNOSIS — E785 Hyperlipidemia, unspecified: Secondary | ICD-10-CM | POA: Diagnosis not present

## 2016-10-28 DIAGNOSIS — R41841 Cognitive communication deficit: Secondary | ICD-10-CM | POA: Diagnosis not present

## 2016-10-28 DIAGNOSIS — I509 Heart failure, unspecified: Secondary | ICD-10-CM | POA: Diagnosis not present

## 2016-10-28 DIAGNOSIS — Z7401 Bed confinement status: Secondary | ICD-10-CM | POA: Diagnosis not present

## 2016-10-28 DIAGNOSIS — E8809 Other disorders of plasma-protein metabolism, not elsewhere classified: Secondary | ICD-10-CM | POA: Diagnosis not present

## 2016-10-28 DIAGNOSIS — G8929 Other chronic pain: Secondary | ICD-10-CM | POA: Diagnosis not present

## 2016-10-28 DIAGNOSIS — E1169 Type 2 diabetes mellitus with other specified complication: Secondary | ICD-10-CM

## 2016-10-28 DIAGNOSIS — K219 Gastro-esophageal reflux disease without esophagitis: Secondary | ICD-10-CM | POA: Diagnosis not present

## 2016-10-28 DIAGNOSIS — G894 Chronic pain syndrome: Secondary | ICD-10-CM | POA: Diagnosis not present

## 2016-10-28 DIAGNOSIS — N189 Chronic kidney disease, unspecified: Secondary | ICD-10-CM

## 2016-10-28 DIAGNOSIS — R531 Weakness: Secondary | ICD-10-CM | POA: Diagnosis not present

## 2016-10-28 DIAGNOSIS — N184 Chronic kidney disease, stage 4 (severe): Secondary | ICD-10-CM

## 2016-10-28 DIAGNOSIS — D631 Anemia in chronic kidney disease: Secondary | ICD-10-CM | POA: Diagnosis not present

## 2016-10-28 DIAGNOSIS — I5032 Chronic diastolic (congestive) heart failure: Secondary | ICD-10-CM | POA: Diagnosis not present

## 2016-10-28 DIAGNOSIS — Z5189 Encounter for other specified aftercare: Secondary | ICD-10-CM | POA: Diagnosis not present

## 2016-10-28 DIAGNOSIS — R2689 Other abnormalities of gait and mobility: Secondary | ICD-10-CM | POA: Diagnosis not present

## 2016-10-28 DIAGNOSIS — R279 Unspecified lack of coordination: Secondary | ICD-10-CM | POA: Diagnosis not present

## 2016-10-28 DIAGNOSIS — K2211 Ulcer of esophagus with bleeding: Secondary | ICD-10-CM | POA: Diagnosis not present

## 2016-10-28 DIAGNOSIS — D72829 Elevated white blood cell count, unspecified: Secondary | ICD-10-CM | POA: Diagnosis not present

## 2016-10-28 DIAGNOSIS — D638 Anemia in other chronic diseases classified elsewhere: Secondary | ICD-10-CM | POA: Diagnosis not present

## 2016-10-28 DIAGNOSIS — E138 Other specified diabetes mellitus with unspecified complications: Secondary | ICD-10-CM | POA: Diagnosis not present

## 2016-10-28 DIAGNOSIS — K625 Hemorrhage of anus and rectum: Secondary | ICD-10-CM | POA: Diagnosis not present

## 2016-10-28 DIAGNOSIS — R062 Wheezing: Secondary | ICD-10-CM | POA: Diagnosis not present

## 2016-10-28 DIAGNOSIS — F329 Major depressive disorder, single episode, unspecified: Secondary | ICD-10-CM | POA: Diagnosis not present

## 2016-10-28 DIAGNOSIS — R4189 Other symptoms and signs involving cognitive functions and awareness: Secondary | ICD-10-CM | POA: Diagnosis not present

## 2016-10-28 DIAGNOSIS — N400 Enlarged prostate without lower urinary tract symptoms: Secondary | ICD-10-CM | POA: Diagnosis not present

## 2016-10-28 DIAGNOSIS — N179 Acute kidney failure, unspecified: Secondary | ICD-10-CM | POA: Diagnosis not present

## 2016-10-28 DIAGNOSIS — B3789 Other sites of candidiasis: Secondary | ICD-10-CM | POA: Diagnosis not present

## 2016-10-28 DIAGNOSIS — E440 Moderate protein-calorie malnutrition: Secondary | ICD-10-CM | POA: Diagnosis not present

## 2016-10-28 DIAGNOSIS — R262 Difficulty in walking, not elsewhere classified: Secondary | ICD-10-CM | POA: Diagnosis not present

## 2016-10-28 LAB — COMPREHENSIVE METABOLIC PANEL
ALBUMIN: 3 g/dL — AB (ref 3.5–5.0)
ALT: 30 U/L (ref 17–63)
AST: 44 U/L — AB (ref 15–41)
Alkaline Phosphatase: 52 U/L (ref 38–126)
Anion gap: 10 (ref 5–15)
BILIRUBIN TOTAL: 0.8 mg/dL (ref 0.3–1.2)
BUN: 53 mg/dL — AB (ref 6–20)
CHLORIDE: 104 mmol/L (ref 101–111)
CO2: 25 mmol/L (ref 22–32)
CREATININE: 4.48 mg/dL — AB (ref 0.61–1.24)
Calcium: 8.8 mg/dL — ABNORMAL LOW (ref 8.9–10.3)
GFR calc Af Amer: 13 mL/min — ABNORMAL LOW (ref >60)
GFR, EST NON AFRICAN AMERICAN: 11 mL/min — AB (ref >60)
GLUCOSE: 179 mg/dL — AB (ref 65–99)
POTASSIUM: 3.8 mmol/L (ref 3.5–5.1)
Sodium: 139 mmol/L (ref 135–145)
TOTAL PROTEIN: 6.1 g/dL — AB (ref 6.5–8.1)

## 2016-10-28 LAB — RENAL FUNCTION PANEL
Albumin: 3 g/dL — ABNORMAL LOW (ref 3.5–5.0)
Anion gap: 11 (ref 5–15)
BUN: 54 mg/dL — AB (ref 6–20)
CHLORIDE: 103 mmol/L (ref 101–111)
CO2: 25 mmol/L (ref 22–32)
Calcium: 8.8 mg/dL — ABNORMAL LOW (ref 8.9–10.3)
Creatinine, Ser: 4.54 mg/dL — ABNORMAL HIGH (ref 0.61–1.24)
GFR calc Af Amer: 13 mL/min — ABNORMAL LOW (ref >60)
GFR calc non Af Amer: 11 mL/min — ABNORMAL LOW (ref >60)
GLUCOSE: 180 mg/dL — AB (ref 65–99)
PHOSPHORUS: 4.4 mg/dL (ref 2.5–4.6)
POTASSIUM: 3.8 mmol/L (ref 3.5–5.1)
Sodium: 139 mmol/L (ref 135–145)

## 2016-10-28 LAB — CBC WITH DIFFERENTIAL/PLATELET
BASOS ABS: 0 10*3/uL (ref 0.0–0.1)
Basophils Relative: 0 %
EOS ABS: 0.5 10*3/uL (ref 0.0–0.7)
Eosinophils Relative: 4 %
HEMATOCRIT: 31 % — AB (ref 39.0–52.0)
Hemoglobin: 10 g/dL — ABNORMAL LOW (ref 13.0–17.0)
LYMPHS ABS: 1.1 10*3/uL (ref 0.7–4.0)
LYMPHS PCT: 8 %
MCH: 27.6 pg (ref 26.0–34.0)
MCHC: 32.3 g/dL (ref 30.0–36.0)
MCV: 85.6 fL (ref 78.0–100.0)
MONOS PCT: 8 %
Monocytes Absolute: 1 10*3/uL (ref 0.1–1.0)
NEUTROS PCT: 80 %
Neutro Abs: 10.7 10*3/uL — ABNORMAL HIGH (ref 1.7–7.7)
Platelets: 333 10*3/uL (ref 150–400)
RBC: 3.62 MIL/uL — AB (ref 4.22–5.81)
RDW: 17.7 % — ABNORMAL HIGH (ref 11.5–15.5)
WBC: 13.3 10*3/uL — ABNORMAL HIGH (ref 4.0–10.5)

## 2016-10-28 LAB — GLUCOSE, CAPILLARY
GLUCOSE-CAPILLARY: 181 mg/dL — AB (ref 65–99)
Glucose-Capillary: 165 mg/dL — ABNORMAL HIGH (ref 65–99)

## 2016-10-28 LAB — PHOSPHORUS: Phosphorus: 4.4 mg/dL (ref 2.5–4.6)

## 2016-10-28 LAB — MAGNESIUM: MAGNESIUM: 1.9 mg/dL (ref 1.7–2.4)

## 2016-10-28 MED ORDER — INSULIN ASPART 100 UNIT/ML ~~LOC~~ SOLN: 0.0000 [IU] | Freq: Three times a day (TID) | SUBCUTANEOUS | 11 refills | Status: DC

## 2016-10-28 MED ORDER — POTASSIUM CHLORIDE CRYS ER 20 MEQ PO TBCR: 40.0000 meq | EXTENDED_RELEASE_TABLET | Freq: Two times a day (BID) | ORAL | 0 refills | Status: AC

## 2016-10-28 MED ORDER — TRAMADOL HCL 50 MG PO TABS: 50.0000 mg | ORAL_TABLET | Freq: Two times a day (BID) | ORAL | 0 refills | Status: DC

## 2016-10-28 MED ORDER — FUROSEMIDE 80 MG PO TABS: 80.0000 mg | ORAL_TABLET | Freq: Every day | ORAL | 0 refills | Status: AC

## 2016-10-28 MED ORDER — AMLODIPINE BESYLATE 5 MG PO TABS: 5.0000 mg | ORAL_TABLET | Freq: Every morning | ORAL | 0 refills | Status: AC

## 2016-10-28 NOTE — Clinical Social Work Placement (Signed)
   CLINICAL SOCIAL WORK PLACEMENT  NOTE  Date:  10/28/2016  Patient Details  Name: John [name] MRN: [id number] Date of Birth: Jan 11, 1931  Clinical Social Work is seeking post-discharge placement for this patient at the [place] level of care (*CSW will initial, date and re-position this form in  chart as items are completed):  Yes   Patient/family provided with [place] Work Department's list of facilities offering this level of care within the geographic area requested by the patient (or if unable, by the patient's family).  Yes   Patient/family informed of their freedom to choose among providers that offer the needed level of care, that participate in Medicare, Medicaid or managed care program needed by the patient, have an available bed and are willing to accept the patient.  Yes   Patient/family informed of [facility]'s ownership interest in [facility] and [facility], as well as of the fact that they are under no obligation to receive care at these facilities.  PASRR submitted to EDS on       PASRR number received on       Existing PASRR number confirmed on 10/26/16     FL2 transmitted to all facilities in geographic area requested by pt/family on 10/26/16     FL2 transmitted to all facilities within larger geographic area on       Patient informed that his/her managed care company has contracts with or will negotiate with certain facilities, including the following:        Yes   Patient/family informed of bed offers received.  Patient chooses bed at [facility]     Physician recommends and patient chooses bed at      Patient to be transferred to [facility] on 10/28/16.  Patient to be transferred to facility by [facility]     Patient family notified on 10/28/16 of transfer.  Name of family member notified:  Mrs. McPhereson, daughter     PHYSICIAN       Additional Comment:  LCSW notified facility and daughter of  patient's discharge. LCSW sent clinicals via [employer]. LCSW arranged transport.  LCSW signing off.   _______________________________________________ Ihor Gully, LCSW 10/28/2016, 2:11 PM
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
## 2016-10-28 NOTE — Progress Notes (Signed)
Called report on patient to [place]. Awaiting on EMS to pick up patient for transport.

## 2016-10-28 NOTE — Progress Notes (Signed)
Patient discharged to [facility] place via EMS. Patient's IV removed and site intact. Patient discharged with all personal belongings.

## 2016-10-28 NOTE — Care Management Important Message (Signed)
Important Message  Patient Details  Name: John [name] MRN: [id number] Date of Birth: Oct 20, 1930   Medicare Important Message Given:  Yes    Annissa Andreoni, Chauncey Reading, RN 10/28/2016, 3:14 PM

## 2016-10-28 NOTE — Clinical Social Work Placement (Signed)
   CLINICAL SOCIAL WORK PLACEMENT  NOTE  Date:  10/28/2016  Patient Details  Name: John [name] MRN: [id number] Date of Birth: 01-Aug-1930  Clinical Social Work is seeking post-discharge placement for this patient at the [place] level of care (*CSW will initial, date and re-position this form in  chart as items are completed):  Yes   Patient/family provided with [facility] Work Department's list of facilities offering this level of care within the geographic area requested by the patient (or if unable, by the patient's family).  Yes   Patient/family informed of their freedom to choose among providers that offer the needed level of care, that participate in Medicare, Medicaid or managed care program needed by the patient, have an available bed and are willing to accept the patient.  Yes   Patient/family informed of [facility]'s ownership interest in [facility] and [facility], as well as of the fact that they are under no obligation to receive care at these facilities.  PASRR submitted to EDS on       PASRR number received on       Existing PASRR number confirmed on 10/26/16     FL2 transmitted to all facilities in geographic area requested by pt/family on 10/26/16     FL2 transmitted to all facilities within larger geographic area on       Patient informed that his/her managed care company has contracts with or will negotiate with certain facilities, including the following:        Yes   Patient/family informed of bed offers received.  Patient chooses bed at [facility]     Physician recommends and patient chooses bed at      Patient to be transferred to [facility] on  .  Patient to be transferred to facility by       Patient family notified on   of transfer.  Name of family member notified:        PHYSICIAN Please sign FL2     Additional Comment:    _______________________________________________ Ihor Gully,  LCSW 10/28/2016, 11:24 AM

## 2016-10-28 NOTE — Plan of Care (Signed)
Problem: Safety: Goal: Ability to remain free from injury will improve Outcome: Progressing Pt has remained free from falls this admission. Pt educated to call bell, re-oriented to person, place, time upon each entrance into room. Pt has fall mat on floor and bed alarm on. Pt does have confusion and on delirium precautions. Continuing to monitor pts safety.

## 2016-10-28 NOTE — Discharge Summary (Addendum)
Physician Discharge Summary  John [name] JEH:[id number] DOB: May 10, 1931 DOA: 10/24/2016  PCP: Purvis Kilts, MD  Admit date: 10/24/2016 Discharge date: 10/28/2016  Admitted From: Home  Disposition:  [place]   Recommendations for Outpatient Follow-up:  1. Follow up with PCP within 1 week 2. Follow up with GI within 6 weeks 3. Please obtain BMP/CBC in two days 4. Monitor BP and CBG's closely and add additional medications as needed   Home Health: No  Equipment/Devices: None  Discharge Condition: Stable CODE STATUS: Full Code  Diet recommendation: Renal carb modified diet   Brief/Interim Summary: John [name] a 83 y.o.malewith medical history significant of HTN, HLD, DM, Chronic Diastolic CHF, CKD Stage 5 (Refusing Dialysis), Hx of GI Bleed, GERD and other comorbids who presented to [facility] with a CC of Rectal Bleeding weakness and fall. Patient states he was in his usual state of health and went to see his Nephrologist on Wednesday who recommended dialysis but patient refused. He states after his appointment he went to Eat in [place] and came back and had a feeling of pressure that he had to urinate and when he went to the bathroom had a dark colored stool. Patient states he has been having dark and bloody stools ever since Wednesday night and on 10/24/16 AM he woke up to go have a bowel movement and it was pure blood. Patient got up from the toilet and started ambulating back to the bedroom but became extremely weak and slid down (not hitting his head) and hit his life alert button. After several minutes the life alert wasn't working so patient got up from the floor and reached the phone and dialed 911. EMS came to get the patient and brought the patient in to [facility] for evaluation. Patient admits to being incontinent more than usual and states he gets nauseous more and takes Zofran for his nausea. He was admitted for GIB and Gastroenterology was consulted and patient  underwent Colonoscopy and EGD which was unrevealing. Bleed was suspected to be from AVMs Nephrology was also consulted for his AKI on CKD Stage 5. Patient became disoriented the night of 4/29-/4/30 and again overnight. He knows where he is this AM and only complains of generalized weakness. Agreeable to SNF but needs to have 1:1 Sitter discontinued for 24 hours.  Sitter was discontinued and patient was stable for discharge to [facility]  on 10/28/16.  He will need to start back on a lower dose of his antihypertensives- Amlodipine 5mg  started, continued furosemide 80mg  daily as well as tamsulosin and finasteride.  Was previously on nebivochol and losartan.  May need further medication adjustment at SNF.  Patient's daughter is interested in possibly persuing hospice services if patient were to qualify for them (she wants to further discuss this with nephrology).  Discharge Diagnoses:  Active Problems:   Type 2 diabetes mellitus with hyperlipidemia (HCC)   Anemia   Essential hypertension   GERD   Melena   CKD (chronic kidney disease), stage IV (HCC)   BPH (benign prostatic hyperplasia)   DM type 2 (diabetes mellitus, type 2) (HCC)   GI bleed   Chronic diastolic congestive heart failure (HCC)   Bright red rectal bleeding   Anemia due to chronic kidney disease   Acute kidney injury superimposed on chronic kidney disease ([place])   Leukocytosis    Discharge Instructions  Discharge Instructions    Call MD for:  difficulty breathing, headache or visual disturbances    Complete  by:  As directed    Call MD for:  extreme fatigue    Complete by:  As directed    Call MD for:  hives    Complete by:  As directed    Call MD for:  persistant dizziness or light-headedness    Complete by:  As directed    Call MD for:  persistant nausea and vomiting    Complete by:  As directed    Call MD for:  severe uncontrolled pain    Complete by:  As directed    Call MD for:  temperature >100.4    Complete by:  As  directed    Diet - low sodium heart healthy    Complete by:  As directed    Discharge instructions    Complete by:  As directed    Discuss restarting aspirin with GI Medications as prescribed Repeat CBC and BMP in 2 days   Increase activity slowly    Complete by:  As directed    Walk with assistance    Complete by:  As directed      Allergies as of 10/28/2016      Reactions   Asa [aspirin] Other (See Comments)   GI bleeding   Latex Other (See Comments)   unknown   Propoxyphene    Other reaction(s): Dizziness   Sulfa Antibiotics Other (See Comments)   unknown   Betadine [povidone Iodine] Rash      Medication List    STOP taking these medications   aspirin EC 81 MG tablet   ferrous sulfate 325 (65 FE) MG tablet   insulin lispro protamine-lispro (75-25) 100 UNIT/ML Susp injection Commonly known as:  HUMALOG 75/25 MIX   losartan 25 MG tablet Commonly known as:  COZAAR   nebivolol 10 MG tablet Commonly known as:  BYSTOLIC   TRILIPIX 161 MG capsule Generic drug:  Choline Fenofibrate   Vitamin D 2000 units Caps     TAKE these medications   albuterol (2.5 MG/3ML) 0.083% nebulizer solution Commonly known as:  PROVENTIL Take 3 mLs (2.5 mg total) by nebulization every 2 (two) hours as needed for wheezing or shortness of breath.   amLODipine 5 MG tablet Commonly known as:  NORVASC Take 1 tablet (5 mg total) by mouth every morning. What changed:  how much to take   ATIVAN 1 MG tablet Generic drug:  LORazepam Take 1 tablet by mouth daily.   clotrimazole-betamethasone cream Commonly known as:  LOTRISONE Apply 1 application topically daily as needed (for skin irritation).   CYMBALTA 60 MG capsule Generic drug:  DULoxetine Take 1 capsule by mouth daily.   finasteride 5 MG tablet Commonly known as:  PROSCAR Take 5 mg by mouth every morning.   furosemide 80 MG tablet Commonly known as:  LASIX Take 1 tablet (80 mg total) by mouth daily. Start taking on:   10/29/2016 What changed:  medication strength  when to take this   insulin aspart 100 UNIT/ML injection Commonly known as:  novoLOG Inject 0-15 Units into the skin 3 (three) times daily with meals.   NON-ASPIRIN 325 MG tablet Generic drug:  acetaminophen Take 650 mg by mouth every 6 (six) hours as needed for moderate pain.   ondansetron 4 MG disintegrating tablet Commonly known as:  ZOFRAN ODT 4mg  ODT q4 hours prn nausea/vomit   pantoprazole 40 MG tablet Commonly known as:  PROTONIX Take 40 mg by mouth 2 (two) times daily.   potassium chloride SA 20 MEQ  tablet Commonly known as:  K-DUR,KLOR-CON Take 2 tablets (40 mEq total) by mouth 2 (two) times daily. What changed:  medication strength  how much to take  when to take this  additional instructions   sodium bicarbonate 650 MG tablet Take 650 mg by mouth 2 (two) times daily.   tamsulosin 0.4 MG Caps capsule Commonly known as:  FLOMAX Take 0.4 mg by mouth every evening.   terazosin 10 MG capsule Commonly known as:  HYTRIN Take 10 mg by mouth at bedtime.   traMADol 50 MG tablet Commonly known as:  ULTRAM Take 1 tablet (50 mg total) by mouth 2 (two) times daily. What changed:  when to take this  reasons to take this       Contact information for follow-up providers    [facility] Follow up in 3 week(s).   Specialty:  Nephrology Contact information: [address] [place] [address] [phone]            Contact information for after-discharge care    Destination    [facility] .   Specialty:  [place] information: [address] [place] [phone]                 Allergies  Allergen Reactions  . Asa [Aspirin] Other (See Comments)    GI bleeding  . Latex Other (See Comments)    unknown  . Propoxyphene     Other reaction(s): Dizziness  . Sulfa Antibiotics Other (See Comments)    unknown   . Betadine [Povidone Iodine] Rash    Consultations:  Gastroenterology  Nephrology  CM  CSW   Procedures/Studies: Dg Chest Portable 1 View  Result Date: 10/24/2016 CLINICAL DATA:  Gastrointestinal bleeding. Recent fall. Shortness of breath. EXAM: PORTABLE CHEST 1 VIEW COMPARISON:  Nov 19, 2015 FINDINGS: There is no edema or consolidation. Heart is upper normal in size with pulmonary vascularity within normal limits. There is aortic atherosclerosis. No pneumothorax. No bone lesions. No adenopathy. IMPRESSION: Aortic atherosclerosis.  No edema or consolidation. Electronically Signed   By: Lowella Grip III M.D.   On: 10/24/2016 13:28      Subjective: Patient seen this am.  Reports he feels well. Says he has had three marriages- one was successful.  Knows where he is, what day it is, what month and year it is.  Does not know why he is in the hospital.  Spoke with daughter on the phone- she would like patient in a facility given his lack of insight and impulsivity.  Discharge Exam: Vitals:   10/28/16 0519 10/28/16 1400  BP: (!) 184/64 (!) 190/85  Pulse: 93 (!) 105  Resp: (!) 21 20  Temp: 98 F (36.7 C) 98.2 F (36.8 C)   Vitals:   10/28/16 0500 10/28/16 0519 10/28/16 0751 10/28/16 1400  BP:  (!) 184/64  (!) 190/85  Pulse:  93  (!) 105  Resp:  (!) 21  20  Temp:  98 F (36.7 C)  98.2 F (36.8 C)  TempSrc:  Oral  Oral  SpO2:  95% 92% 96%  Weight: 110.8 kg (244 lb 4.3 oz)     Height:        General: Pt is alert, awake, not in acute distress Cardiovascular: RRR, S1/S2 +, no rubs, no gallops Respiratory: CTA bilaterally, no wheezing, no rhonchi Abdominal: Soft, NT, ND, bowel sounds + Extremities: no edema, no cyanosis    The results of  significant diagnostics from this hospitalization (including imaging, microbiology, ancillary and laboratory) are listed below for reference.     Microbiology: No results found for this or any previous visit (from the past 240  hour(s)).   Labs: BNP (last 3 results)  Recent Labs  10/25/16 0705  BNP 378.5*   Basic Metabolic Panel:  Recent Labs Lab 10/25/16 0705 10/26/16 0543 10/26/16 1222 10/27/16 0548 10/28/16 0539 10/28/16 0543  NA 139 140 140 139 139 139  K 4.0 3.7 3.7 3.3* 3.8 3.8  CL 107 106 106 103 103 104  CO2 23 25 25 26 25 25   GLUCOSE 195* 166* 136* 146* 180* 179*  BUN 68* 60* 59* 56* 54* 53*  CREATININE 5.52* 4.77* 4.83* 4.78* 4.54* 4.48*  CALCIUM 8.2* 8.4* 8.7* 8.7* 8.8* 8.8*  MG 1.8 1.8  --  1.9  --  1.9  PHOS 4.8* 5.1*  --  5.7* 4.4 4.4   Liver Function Tests:  Recent Labs Lab 10/25/16 0705 10/26/16 0543 10/26/16 1222 10/27/16 0548 10/28/16 0539 10/28/16 0543  AST 18 25 33 39  --  44*  ALT 13* 16* 19 23  --  30  ALKPHOS 35* 37* 43 44  --  52  BILITOT 0.5 0.5 0.7 0.8  --  0.8  PROT 5.5* 5.6* 6.0* 5.7*  --  6.1*  ALBUMIN 2.9* 3.0* 3.2* 2.9* 3.0* 3.0*    Recent Labs Lab 10/24/16 1308  LIPASE 30   No results for input(s): AMMONIA in the last 168 hours. CBC:  Recent Labs Lab 10/24/16 1308  10/25/16 0705 10/25/16 1914 10/25/16 2336 10/26/16 0543 10/27/16 0548 10/28/16 0543  WBC 12.9*  --  12.7*  --   --  11.5* 10.8* 13.3*  NEUTROABS 10.4*  --  9.8*  --   --  8.8*  --  10.7*  HGB 7.5*  < > 8.1* 9.7* 9.5* 9.5* 9.8* 10.0*  HCT 23.3*  < > 25.5* 29.8* 29.1* 29.0* 29.5* 31.0*  MCV 91.4  --  87.0  --   --  85.8 85.5 85.6  PLT 305  --  277  --   --  280 285 333  < > = values in this interval not displayed. Cardiac Enzymes:  Recent Labs Lab 10/24/16 1308  TROPONINI <0.03   BNP: Invalid input(s): POCBNP CBG:  Recent Labs Lab 10/27/16 1113 10/27/16 1620 10/27/16 2002 10/28/16 0832 10/28/16 1241  GLUCAP 190* 136* 192* 165* 181*   D-Dimer No results for input(s): DDIMER in the last 72 hours. Hgb A1c No results for input(s): HGBA1C in the last 72 hours. Lipid Profile No results for input(s): CHOL, HDL, LDLCALC, TRIG, CHOLHDL, LDLDIRECT in the last 72  hours. Thyroid function studies No results for input(s): TSH, T4TOTAL, T3FREE, THYROIDAB in the last 72 hours.  Invalid input(s): FREET3 Anemia work up No results for input(s): VITAMINB12, FOLATE, FERRITIN, TIBC, IRON, RETICCTPCT in the last 72 hours. Urinalysis    Component Value Date/Time   COLORURINE YELLOW 11/25/2014 2040   APPEARANCEUR CLEAR 11/25/2014 2040   LABSPEC 1.020 11/25/2014 2040   PHURINE 5.0 11/25/2014 2040   GLUCOSEU 250 (A) 11/25/2014 2040   HGBUR TRACE (A) 11/25/2014 2040   BILIRUBINUR NEGATIVE 11/25/2014 2040   KETONESUR NEGATIVE 11/25/2014 2040   PROTEINUR 30 (A) 11/25/2014 2040   UROBILINOGEN 0.2 11/25/2014 2040   NITRITE NEGATIVE 11/25/2014 2040   LEUKOCYTESUR NEGATIVE 11/25/2014 2040   Sepsis Labs Invalid input(s): PROCALCITONIN,  WBC,  LACTICIDVEN Microbiology No results found for this  or any previous visit (from the past 240 hour(s)).   Time coordinating discharge: 45 minutes  SIGNED:   Loretha Stapler, MD  [facility] 10/28/2016, 3:07 PM Pager [phone] If 7PM-7AM, please contact night-coverage [email or web address] Password [email or web address]

## 2016-10-28 NOTE — Progress Notes (Signed)
John [name]  MRN: [id number]  DOB/AGE: 05-May-1931 83 y.o.  Primary Care Physician:[name], Betsy Coder, MD  Admit date: 10/24/2016  Chief Complaint:  Chief Complaint  Patient presents with  . Rectal Bleeding    S-Pt presented on  10/24/2016 with  Chief Complaint  Patient presents with  . Rectal Bleeding  .        Pt says " I am okay"   Meds  . DULoxetine  60 mg Oral Daily  . finasteride  5 mg Oral q morning - 10a  . furosemide  80 mg Oral Daily  . insulin aspart  0-15 Units Subcutaneous TID WC  . ipratropium-albuterol  3 mL Nebulization BID  . LORazepam  1 mg Oral Daily  . pantoprazole  40 mg Oral BID AC  . potassium chloride  40 mEq Oral BID  . sodium bicarbonate  650 mg Oral BID  . tamsulosin  0.4 mg Oral QPM           Physical Exam: Vital signs in last 24 hours: Temp:  [97.9 F (36.6 C)-98 F (36.7 C)] 98 F (36.7 C) (05/02 0519) Pulse Rate:  [83-93] 93 (05/02 0519) Resp:  [21-24] 21 (05/02 0519) BP: (183-184)/(64-74) 184/64 (05/02 0519) SpO2:  [91 %-95 %] 92 % (05/02 0751) Weight:  [244 lb 4.3 oz (110.8 kg)] 244 lb 4.3 oz (110.8 kg) (05/02 0500) Weight change: -2 lb 5.3 oz (-1.057 kg) Last BM Date: 10/26/16  Intake/Output from previous day: 05/01 0701 - 05/02 0700 In: 840 [P.O.:840] Out: -  No intake/output data recorded.   Physical Exam: General- pt is awake,alert, oriented to time place and person Resp- No acute REsp distress,decreased at bases. CVS- S1S2 regular in rate and rhythm GIT- BS+, soft, NT, ND EXT- 1+ LE Edema, NO  Cyanosis    Lab Results: CBC  Recent Labs  10/27/16 0548 10/28/16 0543  WBC 10.8* 13.3*  HGB 9.8* 10.0*  HCT 29.5* 31.0*  PLT 285 333   hgb 11.3=>7.5=>8.1=>9.8   BMET  Recent Labs  10/28/16 0539 10/28/16 0543  NA 139 139  K 3.8 3.8  CL 103 104  CO2 25 25  GLUCOSE 180* 179*  BUN 54* 53*  CREATININE 4.54* 4.48*  CALCIUM 8.8* 8.8*   Creat trend 2018 4.3=>5.1=>6.3=>5.5=>4.78=>4.4 2017 4.3   2016 3.1--4.0 2015 2.8--4.4 2014  2.1--2.6 2013  2.0 2012  1.6--2.1 2011  1.5--1.8 2010 1.3--1.5 2009  1.2     MICRO No results found for this or any previous visit (from the past 240 hour(s)).    Lab Results  Component Value Date   CALCIUM 8.8 (L) 10/28/2016   CAION 1.17 10/19/2012   PHOS 4.4 10/28/2016       Impression: 1)Renal  AKI secondary to Prerenal/ATN               AKI sec to Hypovolemia               AKI sec to GI bleed causing severe anemia               AKI on CKD               CKD stage 5.               CKD since 2009               CKD secondary to DM/ Age associated decline  Progression of CKD now marked with AKI                 Proteinura  Present                 AKI better                Creat trending down.                 Pt GFr back to baseline                Need for renal replacement therapy has been discussed multiple times with pt and in presence of the family and   Pt has always maintained that he does NOT want to go on dialysis.  2)HTN  Medication-  On Diuretics     3)Anemia HGb better Anemia of chronic ds Admitted with GI bleed recived PRBC  4)CKD Mineral-Bone Disorder PTH elevated.Secondary Hyperparathyroidism present. Phosphorus on higher side Vitamin d low- on replacement   5)BPH- Primary  MD following  6)Electrolytes Normokalemic NOrmonatremic   7)Acid base Co2 at goal  8) DM- Pt has hx of DM. Being followed by Primary team .     Plan:  Will continue current care.    [place] S 10/28/2016, 9:50 AM

## 2016-10-29 ENCOUNTER — Non-Acute Institutional Stay (SKILLED_NURSING_FACILITY): Payer: Medicare Other | Admitting: Internal Medicine

## 2016-10-29 ENCOUNTER — Encounter: Payer: Self-pay | Admitting: Internal Medicine

## 2016-10-29 DIAGNOSIS — N185 Chronic kidney disease, stage 5: Secondary | ICD-10-CM

## 2016-10-29 DIAGNOSIS — I5032 Chronic diastolic (congestive) heart failure: Secondary | ICD-10-CM

## 2016-10-29 DIAGNOSIS — D638 Anemia in other chronic diseases classified elsewhere: Secondary | ICD-10-CM

## 2016-10-29 DIAGNOSIS — K625 Hemorrhage of anus and rectum: Secondary | ICD-10-CM

## 2016-10-29 DIAGNOSIS — I10 Essential (primary) hypertension: Secondary | ICD-10-CM | POA: Diagnosis not present

## 2016-10-29 DIAGNOSIS — R531 Weakness: Secondary | ICD-10-CM | POA: Diagnosis not present

## 2016-10-29 DIAGNOSIS — R4189 Other symptoms and signs involving cognitive functions and awareness: Secondary | ICD-10-CM | POA: Diagnosis not present

## 2016-10-29 DIAGNOSIS — G8929 Other chronic pain: Secondary | ICD-10-CM

## 2016-10-29 DIAGNOSIS — R062 Wheezing: Secondary | ICD-10-CM

## 2016-10-29 DIAGNOSIS — E876 Hypokalemia: Secondary | ICD-10-CM

## 2016-10-29 DIAGNOSIS — D72829 Elevated white blood cell count, unspecified: Secondary | ICD-10-CM | POA: Diagnosis not present

## 2016-10-29 DIAGNOSIS — K219 Gastro-esophageal reflux disease without esophagitis: Secondary | ICD-10-CM | POA: Diagnosis not present

## 2016-10-29 DIAGNOSIS — F411 Generalized anxiety disorder: Secondary | ICD-10-CM

## 2016-10-29 DIAGNOSIS — N400 Enlarged prostate without lower urinary tract symptoms: Secondary | ICD-10-CM

## 2016-10-29 NOTE — Progress Notes (Signed)
LOCATION: [facility]  PCP: Purvis Kilts, MD   Code Status: DNR  Goals of care: Advanced Directive information Advanced Directives 10/29/2016  Does Patient Have a Medical Advance Directive? Yes  Type of Advance Directive Out of facility DNR (pink MOST or yellow form)  Does patient want to make changes to medical advance directive? No - Patient declined  Copy of [place] in Chart? -  Would patient like information on creating a medical advance directive? -  Pre-existing out of facility DNR order (yellow form or pink MOST form) -       Extended Emergency Contact Information Primary Emergency Contact: McPherson,[name] Address: [place], [place] [address] [place] Phone: [phone] Mobile Phone: [phone] Relation: Daughter   Allergies  Allergen Reactions  . Asa [Aspirin] Other (See Comments)    GI bleeding  . Latex Other (See Comments)    unknown  . Propoxyphene     Other reaction(s): Dizziness  . Sulfa Antibiotics Other (See Comments)    unknown  . Betadine [Povidone Iodine] Rash    Chief Complaint  Patient presents with  . New Admit To SNF    New Admission Visit      HPI:  Patient is a 83 y.o. male seen today for short term rehabilitation post hospital admission from 10/24/16-10/28/16 with rectal bleed. He underwent EGD and colonoscopy showing no active bleed. His bleed was thought to be from occult AVMs in setting of renal failure. He was seen by nephrology for AKI on CKD 5. He has medical history of HTN, HLD, DM, chronic diastolic CHF, CKD stage 5 not on dialysis, GERD among others. He is seen in his room today.   Review of Systems:  Constitutional: Negative for fever, chills.  HENT: Negative for headache, congestion, nasal discharge, difficulty swallowing.   Eyes: Negative for blurred vision, double vision and discharge.  Respiratory: Negative for cough, shortness of  breath and wheezing.   Cardiovascular: Negative for chest pain, palpitations, leg swelling.  Gastrointestinal: Negative for heartburn, nausea, vomiting,loss of appetite. He had a bowel movement 2 days back. Positive for abdominal pain.   Genitourinary: Negative for dysuria.  Musculoskeletal: Negative for aches and pain. Positive for being tired.   Skin: Negative for itching, rash.  Neurological: Negative for dizziness. Psychiatric/Behavioral: Negative for depression.   Past Medical History:  Diagnosis Date  . Bundle branch block, right   . CHF (congestive heart failure) ([place])   . Chronic renal insufficiency   . DM (diabetes mellitus) ([place])   . 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 GERD (gastroesophageal reflux disease)   . GI bleed    2006, TCS/TI showed small polyp. EGD showed erosive antral gastritis with two polyp, one oozing and tx. HP neg. SB capsule shoed few jejunal ulcers with bleeding (naproxen and ASA)  . GI bleed    02/2010, EGD->pancreatitc rest, fundal gland polyps,  no bleeding. TCS->blood-tinged colonic effluent throughout the colon without bleeding lesion found, nl TI. SB capsule->SB erosions, nonbleeding, incomplete study. Patient on naproxyn.   . GI bleed 10/27/10   ileocolonoscopy/GIVENS capsule study by Dr Rourk->bloody colonic effluent throughout colon but no lesion identified, TA @ hepatic flexure, fresh blood in dital SB on capsule. Nuc med RBC study negative  . HTN (hypertension)   .  Hyperlipidemia   . Hypoxia    requiring oxygen while in hospital  . Ischemic colitis, enteritis, or enterocolitis ([place])    flex sig 01/2010  . Kidney disease   . Macular degeneration   . Nephrolithiasis   . Obesity   . Pneumonia   . Poor vision    left eye  . Seborrheic keratoses     back and chest  . Skin cancer   . Sleep apnea   . Stroke ([place])   . Tubular adenoma of colon 10/27/10   on  colonoscopy Dr Gala Romney   Past Surgical History:  Procedure Laterality Date  . APPENDECTOMY    . BACK SURGERY    . CARDIAC CATHETERIZATION    . CARDIOVASCULAR STRESS TEST    . CATARACT EXTRACTION     bilateral  . CHOLECYSTECTOMY    . COLONOSCOPY  10/27/10   Dr. Chauncy Lean adenoma, normal rectum bloody colonic effluent throughout the colon with out bleeding lesion seen.   . COLONOSCOPY N/A 02/16/2014   POE:UMPNTI appearing TI. Moderate sized internal hemorrhoids/TheTC/[place] colon are redundant/TWO COLON POLYPS REMOVED  . COLONOSCOPY N/A 10/25/2016   Procedure: COLONOSCOPY;  Surgeon: Daneil Dolin, MD;  [facility];  Service: Endoscopy;  Laterality: N/A;  . DOPPLER ECHOCARDIOGRAPHY    . ELBOW SURGERY    . ESOPHAGOGASTRODUODENOSCOPY  10/23/10   Dr. Tora Kindred arteriovenous malformations,GI bleed- capsule  . ESOPHAGOGASTRODUODENOSCOPY N/A 02/13/2014   RWE:RXVQMG gland polyps. Pancreatic rest. 2 tiny areas of erosions/ulceration-not likely significant-s/p bx  . ESOPHAGOGASTRODUODENOSCOPY N/A 10/25/2016   Procedure: ESOPHAGOGASTRODUODENOSCOPY (EGD);  Surgeon: Daneil Dolin, MD;  [facility];  Service: Endoscopy;  Laterality: N/A;  . GIVENS CAPSULE STUDY N/A 02/14/2014   Distal SB ulcers with active bleeding noted.  Marland Kitchen [facility]  2012   Negative for Ischemia  . POLYPECTOMY    . stress dipyridamole myocardial perfusion    . TRANSURETHRAL RESECTION OF PROSTATE     Social History:   reports that he has never smoked. He has never used smokeless tobacco. He reports that he does not drink alcohol or use drugs.  Family History  Problem Relation Age of Onset  . Aneurysm Daughter     brain, deceased age 79  . GI problems Brother     gi bleed  . Cancer Sister     unknown type  . Colon cancer Neg Hx   . Liver disease Neg Hx     Medications: Allergies as of 10/29/2016      Reactions   Asa [aspirin] Other (See Comments)   GI bleeding   Latex Other (See Comments)    unknown   Propoxyphene    Other reaction(s): Dizziness   Sulfa Antibiotics Other (See Comments)   unknown   Betadine [povidone Iodine] Rash      Medication List       Accurate as of 10/29/16 10:01 AM. Always use your most recent med list.          albuterol (2.5 MG/3ML) 0.083% nebulizer solution Commonly known as:  PROVENTIL Take 3 mLs (2.5 mg total) by nebulization every 2 (two) hours as needed for wheezing or shortness of breath.   amLODipine 5 MG tablet Commonly known as:  NORVASC Take 1 tablet (5 mg total) by mouth every morning.   ATIVAN 1 MG tablet Generic drug:  LORazepam Take 1 tablet by mouth daily.   clotrimazole-betamethasone cream Commonly known as:  LOTRISONE Apply 1 application topically daily as needed (  for skin irritation).   CYMBALTA 60 MG capsule Generic drug:  DULoxetine Take 1 capsule by mouth daily.   finasteride 5 MG tablet Commonly known as:  PROSCAR Take 5 mg by mouth every morning.   furosemide 80 MG tablet Commonly known as:  LASIX Take 1 tablet (80 mg total) by mouth daily.   NON-ASPIRIN 325 MG tablet Generic drug:  acetaminophen Take 650 mg by mouth every 6 (six) hours as needed for moderate pain.   ondansetron 4 MG disintegrating tablet Commonly known as:  ZOFRAN ODT 4mg  ODT q4 hours prn nausea/vomit   pantoprazole 40 MG tablet Commonly known as:  PROTONIX Take 40 mg by mouth 2 (two) times daily.   potassium chloride SA 20 MEQ tablet Commonly known as:  K-DUR,KLOR-CON Take 2 tablets (40 mEq total) by mouth 2 (two) times daily.   sodium bicarbonate 325 MG tablet Take 325 mg by mouth 2 (two) times daily.   tamsulosin 0.4 MG Caps capsule Commonly known as:  FLOMAX Take 0.4 mg by mouth every evening.   terazosin 10 MG capsule Commonly known as:  HYTRIN Take 10 mg by mouth at bedtime.   traMADol 50 MG tablet Commonly known as:  ULTRAM Take 1 tablet (50 mg total) by mouth 2 (two) times daily.        Immunizations: Immunization History  Administered Date(s) Administered  . PPD Test 10/28/2016     Physical Exam: Vitals:   10/29/16 0947  BP: 140/83  Pulse: 96  Resp: 18  Temp: 97.5 F (36.4 C)  TempSrc: Oral  SpO2: 95%  Weight: 235 lb (106.6 kg)  Height: 5\' 7"  (1.702 m)   Body mass index is 36.81 kg/m.  General- elderly male, obese, in no acute distress Head- normocephalic, atraumatic Nose- no nasal discharge Throat- moist mucus membrane, missing teeth  Eyes- PERRLA, EOMI, no pallor, no icterus, no discharge, normal conjunctiva, normal sclera Neck- no cervical lymphadenopathy Cardiovascular- normal s1,s2, no murmur Respiratory- bilateral poor air movement, expiratory wheezing present, short of breath in between sentences during conversation, no rhonchi, no crackles, no use of accessory muscles Abdomen- bowel sounds present, soft, non tender Musculoskeletal- able to move all 4 extremities, generalized weakness Neurological- alert and oriented to self, person and place but not to time Skin- warm and dry Psychiatry- normal mood and affect    Labs reviewed: Basic Metabolic Panel:  Recent Labs  10/26/16 0543  10/27/16 0548 10/28/16 0539 10/28/16 0543  NA 140  < > 139 139 139  K 3.7  < > 3.3* 3.8 3.8  CL 106  < > 103 103 104  CO2 25  < > 26 25 25   GLUCOSE 166*  < > 146* 180* 179*  BUN 60*  < > 56* 54* 53*  CREATININE 4.77*  < > 4.78* 4.54* 4.48*  CALCIUM 8.4*  < > 8.7* 8.8* 8.8*  MG 1.8  --  1.9  --  1.9  PHOS 5.1*  --  5.7* 4.4 4.4  < > = values in this interval not displayed. Liver Function Tests:  Recent Labs  10/26/16 1222 10/27/16 0548 10/28/16 0539 10/28/16 0543  AST 33 39  --  44*  ALT 19 23  --  30  ALKPHOS 43 44  --  52  BILITOT 0.7 0.8  --  0.8  PROT 6.0* 5.7*  --  6.1*  ALBUMIN 3.2* 2.9* 3.0* 3.0*    Recent Labs  10/24/16 1308  LIPASE 30   No results for  input(s): AMMONIA in the last 8760 hours. CBC:  Recent Labs   10/25/16 0705  10/26/16 0543 10/27/16 0548 10/28/16 0543  WBC 12.7*  --  11.5* 10.8* 13.3*  NEUTROABS 9.8*  --  8.8*  --  10.7*  HGB 8.1*  < > 9.5* 9.8* 10.0*  HCT 25.5*  < > 29.0* 29.5* 31.0*  MCV 87.0  --  85.8 85.5 85.6  PLT 277  --  280 285 333  < > = values in this interval not displayed. Cardiac Enzymes:  Recent Labs  11/19/15 1717 10/24/16 1308  TROPONINI <0.03 <0.03   BNP: Invalid input(s): POCBNP CBG:  Recent Labs  10/27/16 2002 10/28/16 0832 10/28/16 1241  GLUCAP 192* 165* 181*    Radiological Exams: Dg Chest Portable 1 View  Result Date: 10/24/2016 CLINICAL DATA:  Gastrointestinal bleeding. Recent fall. Shortness of breath. EXAM: PORTABLE CHEST 1 VIEW COMPARISON:  Nov 19, 2015 FINDINGS: There is no edema or consolidation. Heart is upper normal in size with pulmonary vascularity within normal limits. There is aortic atherosclerosis. No pneumothorax. No bone lesions. No adenopathy. IMPRESSION: Aortic atherosclerosis.  No edema or consolidation. Electronically Signed   By: Lowella Grip III M.D.   On: 10/24/2016 13:28    Assessment/Plan  Generalized weakness From deconditioning along with medical co-morbidities. Has CKD stage 5 and has refused dialysis. Will have patient work with PT/OT as tolerated to regain strength and restore function.  Fall precautions are in place. Will get palliative care consult to review goals of care and treatment plan. He is a high risk for readmission.  Rectal bleed Thought to be from AVMs. Continue pantoprazole 40 mg bid. GI f/u in 6 weeks. Monitor clinically and check h&h  Cognitive impairment No diagnosis of dementia. His ESRD and being on ativan could contribute some. SLP consult. Aspiration precautions. Change dosage of ativan as below  gerd Continue pantoprazole as above and monitor  Wheezing With poor air movement. He has history of chronic diastolic chf but no known lung disease. Currently on albuterol neb as  needed. Change this to duoneb qid with albuterol neb q2 h prn and monitor. Given leukocytosis with wheezing and high aspiration risk, get chest xray to rule out infiltrates. Get SLP to evaluate.   Leukocytosis Afebrile but has cognitive impairment and wheezing with decreased air entry. Rule out pneumonia vs bronchitis. Get chest xray. Monitor cbc with diff and temp curve  Chronic diastolic chf Off all medications, monitor clinically, weight check 3 days a week  Anemia of chronic disease Check cbc  CKD stage 5 Has refused dialysis. Monitor his mental state. Monitor urine output. Continue zofran as needed for nausea. Continue sodium bicarbonate.   Chronic pain Continue tramadol 50 mg bid, cymblata 60 mg daily. denies pain this visit. PMR consult to assess for dose reduction.   BPH Monitor urine output, continue finasteride, tamsulosin and terazosin  HTN Continue amlodipine 5 mg daily and monitor BP  GAD Currently on ativan 1 mg daily. Decrease this to 0.5 mg daily given his cognitive impairment and get psychiatry to evaluate. Try to avoid BZD if possible.   Hypokalemia On kcl supplement, with ckd stage 5, monitor bmp   Goals of care: short term rehabilitation   Labs/tests ordered: cbc, cmp 10/30/16 and 11/02/16  Family/ staff Communication: reviewed care plan with patient and nursing supervisor    Blanchie Serve, MD Internal Medicine [facility] [address] [place], [place] [address] Cell Phone (Monday-Friday  8 am - 5 pm): [phone] On Call: [phone] and follow prompts after 5 pm and on weekends Office Phone: [phone] Office Fax: [phone]

## 2016-10-30 DIAGNOSIS — G894 Chronic pain syndrome: Secondary | ICD-10-CM | POA: Diagnosis not present

## 2016-10-30 LAB — BASIC METABOLIC PANEL
BUN: 75 mg/dL — AB (ref 4–21)
Creatinine: 5.7 mg/dL — AB (ref 0.6–1.3)
Glucose: 165 mg/dL
Potassium: 4.4 mmol/L (ref 3.4–5.3)
SODIUM: 143 mmol/L (ref 137–147)

## 2016-10-30 LAB — CBC AND DIFFERENTIAL
HEMATOCRIT: 30 % — AB (ref 41–53)
HEMOGLOBIN: 9.3 g/dL — AB (ref 13.5–17.5)
Platelets: 332 10*3/uL (ref 150–399)
WBC: 18.2 10*3/mL

## 2016-10-30 LAB — HEPATIC FUNCTION PANEL
ALT: 25 U/L (ref 10–40)
AST: 26 U/L (ref 14–40)
Alkaline Phosphatase: 71 U/L (ref 25–125)
BILIRUBIN, TOTAL: 0.6 mg/dL

## 2016-11-02 ENCOUNTER — Encounter: Payer: Self-pay | Admitting: *Deleted

## 2016-11-02 ENCOUNTER — Non-Acute Institutional Stay (SKILLED_NURSING_FACILITY): Payer: Medicare Other | Admitting: Family

## 2016-11-02 ENCOUNTER — Encounter: Payer: Self-pay | Admitting: Family

## 2016-11-02 DIAGNOSIS — D631 Anemia in chronic kidney disease: Secondary | ICD-10-CM | POA: Diagnosis not present

## 2016-11-02 DIAGNOSIS — E440 Moderate protein-calorie malnutrition: Secondary | ICD-10-CM

## 2016-11-02 DIAGNOSIS — B3789 Other sites of candidiasis: Secondary | ICD-10-CM | POA: Diagnosis not present

## 2016-11-02 DIAGNOSIS — D72829 Elevated white blood cell count, unspecified: Secondary | ICD-10-CM | POA: Diagnosis not present

## 2016-11-02 DIAGNOSIS — E8809 Other disorders of plasma-protein metabolism, not elsewhere classified: Secondary | ICD-10-CM | POA: Diagnosis not present

## 2016-11-02 DIAGNOSIS — G894 Chronic pain syndrome: Secondary | ICD-10-CM | POA: Diagnosis not present

## 2016-11-02 DIAGNOSIS — N185 Chronic kidney disease, stage 5: Secondary | ICD-10-CM | POA: Diagnosis not present

## 2016-11-02 LAB — COMPREHENSIVE METABOLIC PANEL
ALK PHOS: 88 U/L
ALT: 26
AST: 23 U/L
BUN: 100 mg/dL — AB (ref 4–21)
CREATININE: 6.78
Glucose: 182
Potassium: 4.3 mmol/L
SODIUM: 142
Total Bilirubin: 0.5 mg/dL

## 2016-11-02 LAB — CBC
HCT: 33.4
HGB: 10.3 g/dL
WBC: 10.8
platelet count: 364

## 2016-11-02 NOTE — Progress Notes (Signed)
Location:  [facility] Room Number: 207-P Place of Service:  SNF [phone]) Provider: Negan Grudzien FNP-C  Sharilyn Sites, MD  Patient Care Team: Sharilyn Sites, MD as PCP - General (Family Medicine) Gala Romney Cristopher Estimable, MD (Gastroenterology)  Extended Emergency Contact Information Primary Emergency Contact: [facility] Address: [place], [place] [address] [place] Phone: [phone] Mobile Phone: [phone] Relation: Daughter Secondary Emergency Contact: McPherson,Finley  [place] Relation: Other  Code Status: DNR Goals of care: Advanced Directive information Advanced Directives 11/02/2016  Does Patient Have a Medical Advance Directive? Yes  Type of Advance Directive Out of facility DNR (pink MOST or yellow form)  Does patient want to make changes to medical advance directive? -  Copy of [place] in Chart? -  Would patient like information on creating a medical advance directive? -  Pre-existing out of facility DNR order (yellow form or pink MOST form) -     Chief Complaint  Patient presents with  . Acute Visit    Abnormal labs    HPI:  Pt is a 83 y.o. male seen today at [facility] for an acute visit for evaluation of abnormal lab results. He has a medical history of HTN, Type 2 DM, hyperlipidemia, CHF, CKD stage 5 not on dialysis among other condition. He is status post hospital admission from 10/24/2016-10/28/2016 with rectal bleeding. He is seen in his room today with son at bedside. Patient's son states patient seems to be getting better recognizing family members and not as sleepy. His recent lab results showed WBC 18.2, Hgb 9.3, HCT 29.6, BUN 75, CR 5.68, TP 5.7, Alb 3.18 ( 10/30/2016). Facility Nurse reports no new acute concerns.Per records reviewed patient has refused dialysis.Recent CXR Pa/Lat done 10/29/2016 showed no acute  pulmonary abnormality. unable to obtain HPI and ROS from patient due to cognitive impairment and sleepiness.    Past Medical History:  Diagnosis Date  . Bundle branch block, right   . CHF (congestive heart failure) ([place])   . Chronic renal insufficiency   . DM (diabetes mellitus) ([place])   . 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 GERD (gastroesophageal reflux disease)   . GI bleed    2006, TCS/TI showed small polyp. EGD showed erosive antral gastritis with two polyp, one oozing and tx. HP neg. SB capsule shoed few jejunal ulcers with bleeding (naproxen and ASA)  . GI bleed    02/2010, EGD->pancreatitc rest, fundal gland polyps,  no bleeding. TCS->blood-tinged colonic effluent throughout the colon without bleeding lesion found, nl TI. SB capsule->SB erosions, nonbleeding, incomplete study. Patient on naproxyn.   . GI bleed 10/27/10   ileocolonoscopy/GIVENS capsule study by Dr Rourk->bloody colonic effluent throughout colon but no lesion identified, TA @ hepatic flexure, fresh blood in dital SB on capsule. Nuc med RBC study negative  . HTN (hypertension)   . Hyperlipidemia   . Hypoxia    requiring oxygen while in hospital  . Ischemic colitis, enteritis, or enterocolitis ([place])    flex sig 01/2010  . Kidney disease   . Macular degeneration   . Nephrolithiasis   . Obesity   . Pneumonia   . Poor vision    left eye  . Seborrheic keratoses     back and chest  .  Skin cancer   . Sleep apnea   . Stroke ([place])   . Tubular adenoma of colon 10/27/10   on colonoscopy Dr Gala Romney   Past Surgical History:  Procedure Laterality Date  . APPENDECTOMY    . BACK SURGERY    . CARDIAC CATHETERIZATION    . CARDIOVASCULAR STRESS TEST    . CATARACT EXTRACTION     bilateral  . CHOLECYSTECTOMY    . COLONOSCOPY  10/27/10   Dr. Chauncy Lean adenoma, normal rectum bloody colonic effluent throughout the colon with out bleeding lesion  seen.   . COLONOSCOPY N/A 02/16/2014   KCM:KLKJZP appearing TI. Moderate sized internal hemorrhoids/TheTC/[place] colon are redundant/TWO COLON POLYPS REMOVED  . COLONOSCOPY N/A 10/25/2016   Procedure: COLONOSCOPY;  Surgeon: Daneil Dolin, MD;  [facility]: [facility];  Service: Endoscopy;  Laterality: N/A;  . DOPPLER ECHOCARDIOGRAPHY    . ELBOW SURGERY    . ESOPHAGOGASTRODUODENOSCOPY  10/23/10   Dr. Tora Kindred arteriovenous malformations,GI bleed- capsule  . ESOPHAGOGASTRODUODENOSCOPY N/A 02/13/2014   HXT:AVWPVX gland polyps. Pancreatic rest. 2 tiny areas of erosions/ulceration-not likely significant-s/p bx  . ESOPHAGOGASTRODUODENOSCOPY N/A 10/25/2016   Procedure: ESOPHAGOGASTRODUODENOSCOPY (EGD);  Surgeon: Daneil Dolin, MD;  [facility];  Service: Endoscopy;  Laterality: N/A;  . GIVENS CAPSULE STUDY N/A 02/14/2014   Distal SB ulcers with active bleeding noted.  Marland Kitchen NM MYOVIEW LTD  2012   Negative for Ischemia  . POLYPECTOMY    . stress dipyridamole myocardial perfusion    . TRANSURETHRAL RESECTION OF PROSTATE      Allergies  Allergen Reactions  . Asa [Aspirin] Other (See Comments)    GI bleeding  . Latex Other (See Comments)    unknown  . Propoxyphene     Other reaction(s): Dizziness  . Sulfa Antibiotics Other (See Comments)    unknown  . Betadine [Povidone Iodine] Rash    Allergies as of 11/02/2016      Reactions   Asa [aspirin] Other (See Comments)   GI bleeding   Latex Other (See Comments)   unknown   Propoxyphene    Other reaction(s): Dizziness   Sulfa Antibiotics Other (See Comments)   unknown   Betadine [povidone Iodine] Rash      Medication List       Accurate as of 11/02/16  3:22 PM. Always use your most recent med list.          albuterol (2.5 MG/3ML) 0.083% nebulizer solution Commonly known as:  PROVENTIL Take 3 mLs (2.5 mg total) by nebulization every 2 (two) hours as needed for wheezing or shortness of breath.   amLODipine 5 MG  tablet Commonly known as:  NORVASC Take 1 tablet (5 mg total) by mouth every morning.   clotrimazole-betamethasone cream Commonly known as:  LOTRISONE Apply 1 application topically daily as needed (for skin irritation).   CYMBALTA 60 MG capsule Generic drug:  DULoxetine Take 1 capsule by mouth daily.   finasteride 5 MG tablet Commonly known as:  PROSCAR Take 5 mg by mouth every morning.   furosemide 80 MG tablet Commonly known as:  LASIX Take 1 tablet (80 mg total) by mouth daily.   ipratropium-albuterol 0.5-2.5 (3) MG/3ML Soln Commonly known as:  DUONEB Take 3 mLs by nebulization every 6 (six) hours.   LORazepam 0.5 MG tablet Commonly known as:  ATIVAN Take 0.5 mg by mouth daily. Hold for sedation   NON-ASPIRIN 325 MG tablet Generic drug:  acetaminophen Take 650 mg by mouth every 6 (six)  hours as needed for moderate pain.   ondansetron 4 MG disintegrating tablet Commonly known as:  ZOFRAN ODT 4mg  ODT q4 hours prn nausea/vomit   pantoprazole 40 MG tablet Commonly known as:  PROTONIX Take 40 mg by mouth 2 (two) times daily.   potassium chloride SA 20 MEQ tablet Commonly known as:  K-DUR,KLOR-CON Take 2 tablets (40 mEq total) by mouth 2 (two) times daily.   sodium bicarbonate 325 MG tablet Take 325 mg by mouth 2 (two) times daily.   tamsulosin 0.4 MG Caps capsule Commonly known as:  FLOMAX Take 0.4 mg by mouth every evening.   terazosin 10 MG capsule Commonly known as:  HYTRIN Take 10 mg by mouth at bedtime.   traMADol 50 MG tablet Commonly known as:  ULTRAM Take 50 mg by mouth 2 (two) times daily as needed. If pain not relieved by tylenol       Review of Systems  Unable to perform ROS: Other    Immunization History  Administered Date(s) Administered  . PPD Test 10/28/2016  . Pneumococcal Polysaccharide-23 10/29/2016   Pertinent  Health Maintenance Due  Topic Date Due  . FOOT EXAM  02/12/1941  . OPHTHALMOLOGY EXAM  02/12/1941  . URINE  MICROALBUMIN  02/12/1941  . PNA vac Low Risk Adult (1 of 2 - PCV13) 02/11/1998  . INFLUENZA VACCINE  01/27/2017  . HEMOGLOBIN A1C  04/25/2017   Fall Risk  01/10/2015 09/27/2014 09/27/2014  Falls in the past year? No - No  Risk for fall due to : - (No Data) Impaired balance/gait;Impaired mobility;Other (Comment)  Risk for fall due to (comments): - has Life Alert system (necklace) Generalized weakness per patient report secondary to protracted upper respiratory illness.    Vitals:   11/02/16 1012  BP: 128/66  Pulse: 92  Temp: 97 F (36.1 C)  SpO2: 96%  Weight: 235 lb 8 oz (106.8 kg)   Body mass index is 36.88 kg/m. Physical Exam  Constitutional:  Obese elderly in no acute distress   HENT:  Head: Normocephalic.  Mouth/Throat: Oropharynx is clear and moist. No oropharyngeal exudate.  Eyes: Conjunctivae and EOM are normal. Pupils are equal, round, and reactive to light. Right eye exhibits no discharge. Left eye exhibits no discharge. No scleral icterus.  Neck: Normal range of motion. No JVD present. No thyromegaly present.  Cardiovascular: Normal rate, regular rhythm, normal heart sounds and intact distal pulses.  Exam reveals no gallop and no friction rub.   No murmur heard. Pulmonary/Chest: Effort normal and breath sounds normal. No respiratory distress. He has no wheezes. He has no rales.  Oxygen 2 Liters via [place] in place  Abdominal: Soft. Bowel sounds are normal. He exhibits no distension. There is no tenderness. There is no rebound and no guarding.  Musculoskeletal: He exhibits no edema, tenderness or deformity.  Unsteady gait.Moves x 4 extremities without any difficulties.   Lymphadenopathy:    He has no cervical adenopathy.  Neurological: He is alert.  Responsive but falls asleep during conversation.   Skin: Skin is warm and dry. No rash noted. No erythema. No pallor.  Psychiatric: He has a normal mood and affect.    Labs reviewed:  Recent Labs  10/26/16 0543   10/27/16 0548 10/28/16 0539 10/28/16 0543 10/30/16  NA 140  < > 139 139 139 143  K 3.7  < > 3.3* 3.8 3.8 4.4  CL 106  < > 103 103 104  --   CO2 25  < > 26  25 25  --   GLUCOSE 166*  < > 146* 180* 179*  --   BUN 60*  < > 56* 54* 53* 75*  CREATININE 4.77*  < > 4.78* 4.54* 4.48* 5.7*  CALCIUM 8.4*  < > 8.7* 8.8* 8.8*  --   MG 1.8  --  1.9  --  1.9  --   PHOS 5.1*  --  5.7* 4.4 4.4  --   < > = values in this interval not displayed.  Recent Labs  10/26/16 1222 10/27/16 0548 10/28/16 0539 10/28/16 0543 10/30/16  AST 33 39  --  44* 26  ALT 19 23  --  30 25  ALKPHOS 43 44  --  52 71  BILITOT 0.7 0.8  --  0.8  --   PROT 6.0* 5.7*  --  6.1*  --   ALBUMIN 3.2* 2.9* 3.0* 3.0*  --     Recent Labs  10/25/16 0705  10/26/16 0543 10/27/16 0548 10/28/16 0543 10/30/16  WBC 12.7*  --  11.5* 10.8* 13.3* 18.2  NEUTROABS 9.8*  --  8.8*  --  10.7*  --   HGB 8.1*  < > 9.5* 9.8* 10.0* 9.3*  HCT 25.5*  < > 29.0* 29.5* 31.0* 30*  MCV 87.0  --  85.8 85.5 85.6  --   PLT 277  --  280 285 333 332  < > = values in this interval not displayed. Lab Results  Component Value Date   TSH 1.926 11/25/2014   Lab Results  Component Value Date   HGBA1C 8.2 (H) 10/24/2016   Lab Results  Component Value Date   CHOL  01/15/2010    120        ATP III CLASSIFICATION:  <200     mg/dL   Desirable  200-239  mg/dL   Borderline High  >=240    mg/dL   High          HDL 41 01/15/2010   LDLCALC  01/15/2010    39        Total Cholesterol/HDL:CHD Risk Coronary Heart Disease Risk Table                     Men   Women  1/2 Average Risk   3.4   3.3  Average Risk       5.0   4.4  2 X Average Risk   9.6   7.1  3 X Average Risk  23.4   11.0        Use the calculated Patient Ratio above and the CHD Risk Table to determine the patient's CHD Risk.        ATP III CLASSIFICATION (LDL):  <100     mg/dL   Optimal  100-129  mg/dL   Near or Above                    Optimal  130-159  mg/dL   Borderline  160-189   mg/dL   High  >190     mg/dL   Very High   TRIG 199 (H) 01/15/2010   CHOLHDL 2.9 01/15/2010    Significant Diagnostic Results in last 30 days:  Dg Chest Portable 1 View  Result Date: 10/24/2016 CLINICAL DATA:  Gastrointestinal bleeding. Recent fall. Shortness of breath. EXAM: PORTABLE CHEST 1 VIEW COMPARISON:  Nov 19, 2015 FINDINGS: There is no edema or consolidation. Heart is upper normal in size with pulmonary vascularity within normal  limits. There is aortic atherosclerosis. No pneumothorax. No bone lesions. No adenopathy. IMPRESSION: Aortic atherosclerosis.  No edema or consolidation. Electronically Signed   By: Lowella Grip III M.D.   On: 10/24/2016 13:28   Assessment/Plan 1. Leukocytosis Afebrile. WBC 18.2 ( 10/30/2016).Previous WBC 13.3. Recent CXR Pa/Lat showed no acute abnormalities. Will obtain urine specimen for U/A and C/S rule out UTI. Continue to monitor Temp curve .Recheck CBC/diff 11/04/2016.  2. Anemia in stage 4 chronic kidney disease   Hgb 9.3, HCT 29.6( 10/30/2016).Previous Hgb 9.3 appears stable. Continue to monitor CBC.   3. Moderate protein-calorie malnutrition  TP 5.7 ( 10/30/2016).RD consult for protein malnutrition evaluation. Monitor CMP.   4. Hypoalbuminemia Alb 3.18 ( 10/30/2016).RD consult for supplements. Monitor CMP.   5. Candidiasis Gluteal and sacral area beefy redness noted. Apply Nystatin 100,000 units cream three times daily X 10 days.  Family/ staff Communication: Reviewed plan of care with patient and facility Nurse supervisor  Labs/tests ordered: CBC/diff 11/04/2016.  Sandrea Hughs, NP

## 2016-11-03 ENCOUNTER — Encounter: Payer: Self-pay | Admitting: *Deleted

## 2016-11-05 DIAGNOSIS — R531 Weakness: Secondary | ICD-10-CM | POA: Diagnosis not present

## 2016-11-09 DIAGNOSIS — G894 Chronic pain syndrome: Secondary | ICD-10-CM | POA: Diagnosis not present

## 2016-11-11 ENCOUNTER — Telehealth: Payer: Self-pay

## 2016-11-11 NOTE — Telephone Encounter (Addendum)
Sorry to hear this. Will notify other providers.

## 2016-11-11 NOTE — Telephone Encounter (Signed)
Thank you for update. Sorry to hear.

## 2016-11-11 NOTE — Telephone Encounter (Signed)
Pt's daughter called this morning to inform us that he passed away last night.

## 2016-11-11 NOTE — Telephone Encounter (Signed)
Thanks for update, sad to hear the news

## 2016-11-11 NOTE — Telephone Encounter (Signed)
Sorry to hear that news.

## 2016-11-27 DEATH — deceased

## 2016-12-09 ENCOUNTER — Ambulatory Visit: Payer: Medicare Other | Admitting: Gastroenterology
